# Patient Record
Sex: Female | Born: 1935 | Race: White | Hispanic: No | Marital: Single | Smoking: Former smoker
Health system: Southern US, Community
[De-identification: ages and names within clinical notes are randomized; demographics above are authoritative.]

## PROBLEM LIST (undated history)

## (undated) DIAGNOSIS — M1990 Unspecified osteoarthritis, unspecified site: Secondary | ICD-10-CM

## (undated) DIAGNOSIS — T4145XA Adverse effect of unspecified anesthetic, initial encounter: Secondary | ICD-10-CM

## (undated) DIAGNOSIS — E669 Obesity, unspecified: Secondary | ICD-10-CM

## (undated) DIAGNOSIS — J302 Other seasonal allergic rhinitis: Secondary | ICD-10-CM

## (undated) DIAGNOSIS — F419 Anxiety disorder, unspecified: Secondary | ICD-10-CM

## (undated) DIAGNOSIS — N183 Chronic kidney disease, stage 3 unspecified: Secondary | ICD-10-CM

## (undated) DIAGNOSIS — E119 Type 2 diabetes mellitus without complications: Secondary | ICD-10-CM

## (undated) DIAGNOSIS — G4730 Sleep apnea, unspecified: Secondary | ICD-10-CM

## (undated) DIAGNOSIS — I10 Essential (primary) hypertension: Secondary | ICD-10-CM

## (undated) DIAGNOSIS — E662 Morbid (severe) obesity with alveolar hypoventilation: Secondary | ICD-10-CM

## (undated) DIAGNOSIS — I509 Heart failure, unspecified: Secondary | ICD-10-CM

## (undated) DIAGNOSIS — T8859XA Other complications of anesthesia, initial encounter: Secondary | ICD-10-CM

## (undated) DIAGNOSIS — R0602 Shortness of breath: Secondary | ICD-10-CM

## (undated) DIAGNOSIS — K219 Gastro-esophageal reflux disease without esophagitis: Secondary | ICD-10-CM

## (undated) HISTORY — PX: PILONIDAL CYST EXCISION: SHX744

## (undated) HISTORY — PX: TONSILLECTOMY: SUR1361

## (undated) HISTORY — PX: CHOLECYSTECTOMY: SHX55

## (undated) HISTORY — PX: BREAST SURGERY: SHX581

## (undated) HISTORY — PX: OTHER SURGICAL HISTORY: SHX169

## (undated) HISTORY — PX: EYE SURGERY: SHX253

## (undated) HISTORY — PX: HERNIA REPAIR: SHX51

---

## 1998-05-08 ENCOUNTER — Encounter: Payer: Self-pay | Admitting: Family Medicine

## 1998-05-08 ENCOUNTER — Ambulatory Visit (HOSPITAL_COMMUNITY): Admission: RE | Admit: 1998-05-08 | Discharge: 1998-05-08 | Payer: Self-pay | Admitting: Family Medicine

## 1998-07-29 ENCOUNTER — Other Ambulatory Visit: Admission: RE | Admit: 1998-07-29 | Discharge: 1998-07-29 | Payer: Self-pay | Admitting: Family Medicine

## 1999-05-25 ENCOUNTER — Encounter: Payer: Self-pay | Admitting: Family Medicine

## 1999-05-25 ENCOUNTER — Ambulatory Visit (HOSPITAL_COMMUNITY): Admission: RE | Admit: 1999-05-25 | Discharge: 1999-05-25 | Payer: Self-pay | Admitting: Family Medicine

## 1999-11-15 ENCOUNTER — Encounter: Admission: RE | Admit: 1999-11-15 | Discharge: 2000-02-13 | Payer: Self-pay | Admitting: Family Medicine

## 2000-05-01 ENCOUNTER — Encounter (INDEPENDENT_AMBULATORY_CARE_PROVIDER_SITE_OTHER): Payer: Self-pay | Admitting: *Deleted

## 2000-05-01 ENCOUNTER — Encounter (INDEPENDENT_AMBULATORY_CARE_PROVIDER_SITE_OTHER): Payer: Self-pay | Admitting: Specialist

## 2000-05-02 ENCOUNTER — Inpatient Hospital Stay (HOSPITAL_COMMUNITY): Admission: EM | Admit: 2000-05-02 | Discharge: 2000-05-05 | Payer: Self-pay

## 2000-05-02 ENCOUNTER — Encounter: Payer: Self-pay | Admitting: Geriatric Medicine

## 2000-05-05 ENCOUNTER — Inpatient Hospital Stay: Admission: RE | Admit: 2000-05-05 | Discharge: 2000-05-11 | Payer: Self-pay | Admitting: *Deleted

## 2001-05-25 ENCOUNTER — Ambulatory Visit (HOSPITAL_COMMUNITY): Admission: RE | Admit: 2001-05-25 | Discharge: 2001-05-25 | Payer: Self-pay | Admitting: Family Medicine

## 2001-05-25 ENCOUNTER — Encounter: Payer: Self-pay | Admitting: Family Medicine

## 2002-05-28 ENCOUNTER — Encounter: Payer: Self-pay | Admitting: Family Medicine

## 2002-05-28 ENCOUNTER — Ambulatory Visit (HOSPITAL_COMMUNITY): Admission: RE | Admit: 2002-05-28 | Discharge: 2002-05-28 | Payer: Self-pay | Admitting: Family Medicine

## 2003-05-02 ENCOUNTER — Inpatient Hospital Stay (HOSPITAL_COMMUNITY): Admission: EM | Admit: 2003-05-02 | Discharge: 2003-05-21 | Payer: Self-pay | Admitting: Emergency Medicine

## 2004-11-25 ENCOUNTER — Other Ambulatory Visit: Admission: RE | Admit: 2004-11-25 | Discharge: 2004-11-25 | Payer: Self-pay | Admitting: Family Medicine

## 2005-04-01 ENCOUNTER — Ambulatory Visit (HOSPITAL_COMMUNITY): Admission: RE | Admit: 2005-04-01 | Discharge: 2005-04-01 | Payer: Self-pay | Admitting: Family Medicine

## 2006-05-12 ENCOUNTER — Ambulatory Visit (HOSPITAL_COMMUNITY): Admission: RE | Admit: 2006-05-12 | Discharge: 2006-05-12 | Payer: Self-pay | Admitting: Family Medicine

## 2007-05-14 ENCOUNTER — Ambulatory Visit (HOSPITAL_COMMUNITY): Admission: RE | Admit: 2007-05-14 | Discharge: 2007-05-14 | Payer: Self-pay | Admitting: Family Medicine

## 2007-10-26 ENCOUNTER — Encounter: Admission: RE | Admit: 2007-10-26 | Discharge: 2007-10-26 | Payer: Self-pay | Admitting: Family Medicine

## 2008-10-31 ENCOUNTER — Ambulatory Visit (HOSPITAL_COMMUNITY): Admission: RE | Admit: 2008-10-31 | Discharge: 2008-10-31 | Payer: Self-pay | Admitting: Family Medicine

## 2010-04-04 ENCOUNTER — Inpatient Hospital Stay (HOSPITAL_COMMUNITY): Admission: EM | Admit: 2010-04-04 | Discharge: 2010-04-07 | Payer: Self-pay | Admitting: Emergency Medicine

## 2010-04-05 ENCOUNTER — Encounter (INDEPENDENT_AMBULATORY_CARE_PROVIDER_SITE_OTHER): Payer: Self-pay | Admitting: Internal Medicine

## 2010-07-27 ENCOUNTER — Encounter (INDEPENDENT_AMBULATORY_CARE_PROVIDER_SITE_OTHER): Payer: Self-pay | Admitting: *Deleted

## 2010-07-27 ENCOUNTER — Other Ambulatory Visit: Payer: Self-pay | Admitting: Internal Medicine

## 2010-07-27 DIAGNOSIS — R112 Nausea with vomiting, unspecified: Secondary | ICD-10-CM

## 2010-07-27 DIAGNOSIS — Z1231 Encounter for screening mammogram for malignant neoplasm of breast: Secondary | ICD-10-CM

## 2010-08-04 ENCOUNTER — Ambulatory Visit: Payer: Self-pay

## 2010-08-05 ENCOUNTER — Other Ambulatory Visit (HOSPITAL_COMMUNITY): Payer: Self-pay

## 2010-08-05 NOTE — Op Note (Signed)
Summary: Laparoscopic Cholecystectomy                         [facility]  Patient:    Makayla Rodriguez, Makayla Rodriguez                       MRN: [id number] Proc. Date: 05/01/00 Adm. Date:  [id number] Attending:  Gennie Alma CC:         Duncan Dull, M.D.   Operative Report  CENTRAL Crystal #:  [id number]  PREOPERATIVE DIAGNOSES: 1. Chronic cholecystitis and cholelithiasis. 2. Chronically incarcerated large umbilical hernia.  OPERATION PERFORMED:  Laparoscopic cholecystectomy and repair of a large umbilical hernia with Gore-Tex mesh.  SURGEON:  Dr. Orpah Greek.  ASSISTANT:  Dr. Lorelee New.  DESCRIPTION OF PROCEDURE:  Under adequate general endotracheal anesthesia, the patients abdomen was prepared and draped in the usual fashion. There was a large umbilical hernia present in this woman who weighed in excess of 250 pounds and had a very large and deep abdomen. A smile incision was made in the inferior portion of the umbilicus just below the umbilical hernia and carried down through the deep subcutaneous tissue. The wounds were coagulated. The hernia mass was retracted superiorly and the deep fascia inferior to the hernia was exposed and opened longitudinally. The peritoneum was entered. A 10 mm Hasson cannula was introduced into the abdomen. Holding sutures of #0 Vicryl were applied to the fascia. The abdomen was then inflated with carbon dioxide to a filling pressure of 15 mmHg with an automatic insufflator. A video laparoscope was introduced. With this being used for direct vision, an upper midline 10 mm port was introduced atraumatically.  The 5 mm subcostal ports could not be initially inserted because of multiple adhesions in the right upper quadrant of the abdomen due to previous surgery. These adhesions were taken down using hot scissors with electrocoagulation introduced through the upper midline port. Once the right upper quadrant was free of adhesions, the two 5 mm  ports were inserted. There are actually 3 incisions made because the first attempt was made while the adhesions were still present and had to be abandoned. With all ports now operable, the patient was placed in reverse Trendelenburg position and turned to the left. The gallbladder was grasped with grasping forceps and placed on traction superiorly. We had a good deal of difficulty seeing the hilum of the gallbladder because of the patients size and had to use a 30 degree scope. We were able to identify the cystic duct and had it thoroughly well skeletonized It was quadruply clipped with endoclips and transected between the distal 2 clips. A large branch of the cystic artery was never identified but multiple small arteries were clipped and divided as the gallbladder was dissected out of the gallbladder bed of the liver. This was done using hook and spatula coagulation. Hemostasis was intact. There was a slight entrance made into the gallbladder and for this reason, it was placed in an endocatch bag and withdrawn from the umbilical port incision.  The subhepatic and subphrenic spaces were irrigated with sterile saline solution until clear. The subcostal ports were withdrawn under direct vision and no bleeding was noted. The upper midline port was left in place while the repair of the umbilical hernia was carried out.  The automatic insufflator was discontinued and the abdomen was decompressed. There was a large hernia sac present that was the size of a large  orange. It was dissected away from the subcutaneous tissue and from the skin of the umbilicus. The fascia was exposed circumferentially around the entire hernia mass. The sac was then opened and it was found that there was chronic incarceration of the omentum and as a matter of fact there was more omentum out of the abdomen than could be replaced into the abdomen through the defect. We therefore had to excise a good deal of the omentum  over Kelly clamps which were ligated with 2-0 Vicryl. We were then able to reduce the remaining amount of omentum that was present. The hernia sac was then excised with Bovie electrocoagulation thereby leaving a defect in the fascia that was 8 cm in diameter. This was repaired by inserting dual mesh into the defect into the peritoneal cavity and the preperitoneal position with the smooth side facing the internal organs. The Gore-Tex measured 9 x 15. It was oriented longitudinally. It was fixed to the peritoneum with interrupted vertical mattress sutures of Novofil. This gave a secure closure of the defect. The fascia was closed over the Gore-Tex mesh with interrupted sutures of #0 Novofil and the subcutaneous fat was closed with interrupted sutures of 3-0 Vicryl.  We then used the automatic insufflator to reinflate the abdomen and looked at the internal portion of the Gore-Tex and its deployment. We found that there was some rolling up of the edges and some of the rough side of the gallbladder exposed.  We reinserted a 5 mm port through one of the port incisions and used it to smooth out the Gore-Tex in a preperitoneal position. We then fixed it in that position with the spiral tacker.  This gave very good deployment of the Gore-Tex and the abdomen was deflated and the 5 mm and 10 mm ports were withdrawn.  All skin incisions were closed with the generic skin stapler 35-W. It should be noted that the umbilical incision had to be extended laterally in each direction during the course of the repair of the umbilical hernia in order to allow for adequate exposure. The patient tolerated the procedure well and left the operating room in satisfactory condition. DD:  05/01/00 TD:  05/01/00 Job: [id number]        SP Surgical Pathology - STATUS: Final             By: Threasa Beards  ,        Perform Date: 3 Dec01 07:26  Ordered By: Orpah Greek MD , Rennie Plowman          Ordered Date:   Facility: [facility]                              Department: CPATH  Service Report Text  [facility]   [address] [place], [place] [address]   [phone]    REPORT OF SURGICAL PATHOLOGY    Case #: [id number]   Patient Name: DEVANEY, SEGERS   PID: [id number]   Pathologist: Havery Moros, MD   DOB/Age 65/25/1936 (Age: 26) Gender: F   Date Taken: 05/01/2000   Date Received: 05/01/2000    FINAL DIAGNOSIS    ***MICROSCOPIC EXAMINATION AND DIAGNOSIS***    I. GALLBLADDER: CHRONIC CHOLECYSTITIS AND CHOLELITHIASIS.   BENIGN PERICYSTIC LYMPH NODE.    II. BENIGN HERNIA SAC AND BENIGN OMENTAL ADIPOSE TISSUE.    ab   Date Reported: 05/02/2000 Havery Moros,  MD   *** Electronically Signed Out By BNS ***    Clinical information   Cholelithiasis. (tmc)    specimen(s) obtained   1: Gallbladder   2: Portion of Omentum, hernia sac and hernia sac contents    Gross Description   I. Received in formalin is a 10.2 x 3.5 x 2.3 cm intact   gallbladder which has a hyperemic external surface. The   gallbladder contains bile and a few pale green calculi measuring   0.7 to 1.2 cm in greatest dimension. The wall measures 0.1 cm in   thickness. The mucosa is glistening tan red. A 0.3 cm segment   of cystic duct is present which is unremarkable. Adjacent to the   duct there is a 0.8 cm rubbery pink ovoid nodule tentatively   identified as a lymph node. Sections are submitted in one   cassette.    II. Received in formalin is a 15 x 9 x 1 cm portion of fat   partially surfaced by a smooth pink translucent fibromembranous   tissue. There is a separate 22 x 9 x 2 cm portion of fat   consistent with a portion of omentum. There are no mass lesions.   Sections are submitted in one cassette. (GRP:smr 12/3)    smr/

## 2010-08-05 NOTE — Letter (Signed)
Summary: New Patient letter  [facility]  [address] [place], [place] [address]   Phone: [phone]  Fax: [phone]       07/27/2010 MRN: [id number]  Makayla Rodriguez [address] [place], [place]  [address]  Dear Ms. Lauver,  Welcome to the Gastroenterology Division at [facility].    You are scheduled to see Dr.  Claudette Head on August 06, 2010 at 11:30am on the 3rd floor at [facility], [address]  We ask that you try to arrive at our office 15 minutes prior to your appointment time to allow for check-in.  We would like you to complete the enclosed self-administered evaluation form prior to your visit and bring it with you on the day of your appointment.  We will review it with you.  Also, please bring a complete list of all your medications or, if you prefer, bring the medication bottles and we will list them.  Please bring your insurance card so that we may make a copy of it.  If your insurance requires a referral to see a specialist, please bring your referral form from your primary care physician.  Co-payments are due at the time of your visit and may be paid by cash, check or credit card.     Your office visit will consist of a consult with your physician (includes a physical exam), any laboratory testing he/she may order, scheduling of any necessary diagnostic testing (e.g. x-ray, ultrasound, CT-scan), and scheduling of a procedure (e.g. Endoscopy, Colonoscopy) if required.  Please allow enough time on your schedule to allow for any/all of these possibilities.    If you cannot keep your appointment, please call [phone] to cancel or reschedule prior to your appointment date.  This allows [place] the opportunity to schedule an appointment for another patient in need of care.  If you do not cancel or reschedule by 5 p.m. the business day prior to your appointment date, you will be charged a $50.00 late cancellation/no-show fee.    Thank you for  choosing [facility] for your medical needs.  We appreciate the opportunity to care for you.  Please visit [place] at our website  to learn more about our practice.                     Sincerely,                                                             The Gastroenterology Division

## 2010-08-06 ENCOUNTER — Ambulatory Visit: Payer: Self-pay | Admitting: Gastroenterology

## 2010-08-10 LAB — URINE MICROSCOPIC-ADD ON

## 2010-08-10 LAB — GLUCOSE, CAPILLARY
Glucose-Capillary: 144 mg/dL — ABNORMAL HIGH (ref 70–99)
Glucose-Capillary: 165 mg/dL — ABNORMAL HIGH (ref 70–99)
Glucose-Capillary: 169 mg/dL — ABNORMAL HIGH (ref 70–99)
Glucose-Capillary: 176 mg/dL — ABNORMAL HIGH (ref 70–99)
Glucose-Capillary: 185 mg/dL — ABNORMAL HIGH (ref 70–99)
Glucose-Capillary: 255 mg/dL — ABNORMAL HIGH (ref 70–99)

## 2010-08-10 LAB — URINALYSIS, ROUTINE W REFLEX MICROSCOPIC
Bilirubin Urine: NEGATIVE
Nitrite: POSITIVE — AB
pH: 5.5 (ref 5.0–8.0)

## 2010-08-10 LAB — DIFFERENTIAL
Basophils Absolute: 0 10*3/uL (ref 0.0–0.1)
Basophils Absolute: 0.1 10*3/uL (ref 0.0–0.1)
Basophils Relative: 1 % (ref 0–1)
Basophils Relative: 1 % (ref 0–1)
Eosinophils Absolute: 0.7 10*3/uL (ref 0.0–0.7)
Eosinophils Absolute: 0.7 10*3/uL (ref 0.0–0.7)
Eosinophils Relative: 6 % — ABNORMAL HIGH (ref 0–5)
Eosinophils Relative: 7 % — ABNORMAL HIGH (ref 0–5)
Lymphocytes Relative: 23 % (ref 12–46)
Lymphocytes Relative: 24 % (ref 12–46)
Lymphs Abs: 2.4 10*3/uL (ref 0.7–4.0)
Lymphs Abs: 2.5 10*3/uL (ref 0.7–4.0)
Monocytes Absolute: 1 10*3/uL (ref 0.1–1.0)
Monocytes Absolute: 1 10*3/uL (ref 0.1–1.0)
Monocytes Relative: 10 % (ref 3–12)
Monocytes Relative: 10 % (ref 3–12)
Neutro Abs: 5.9 10*3/uL (ref 1.7–7.7)
Neutro Abs: 6.5 10*3/uL (ref 1.7–7.7)
Neutrophils Relative %: 59 % (ref 43–77)
Neutrophils Relative %: 60 % (ref 43–77)

## 2010-08-10 LAB — LIPID PANEL
HDL: 50 mg/dL (ref >39)
LDL Cholesterol: 32 mg/dL (ref 0–99)
Triglycerides: 85 mg/dL (ref <150)
VLDL: 17 mg/dL (ref 0–40)

## 2010-08-10 LAB — VITAMIN B12: Vitamin B-12: 349 pg/mL (ref 211–911)

## 2010-08-10 LAB — BASIC METABOLIC PANEL
BUN: 18 mg/dL (ref 6–23)
BUN: 23 mg/dL (ref 6–23)
BUN: 30 mg/dL — ABNORMAL HIGH (ref 6–23)
CO2: 30 mEq/L (ref 19–32)
CO2: 32 mEq/L (ref 19–32)
Calcium: 10.6 mg/dL — ABNORMAL HIGH (ref 8.4–10.5)
Calcium: 9 mg/dL (ref 8.4–10.5)
Chloride: 100 mEq/L (ref 96–112)
Chloride: 98 mEq/L (ref 96–112)
Creatinine, Ser: 0.72 mg/dL (ref 0.4–1.2)
Creatinine, Ser: 0.77 mg/dL (ref 0.4–1.2)
GFR calc Af Amer: >60 mL/min (ref >60)
GFR calc non Af Amer: >60 mL/min (ref >60)
GFR calc non Af Amer: >60 mL/min (ref >60)
Glucose, Bld: 176 mg/dL — ABNORMAL HIGH (ref 70–99)
Glucose, Bld: 202 mg/dL — ABNORMAL HIGH (ref 70–99)
Potassium: 4.4 mEq/L (ref 3.5–5.1)
Potassium: 4.4 mEq/L (ref 3.5–5.1)
Sodium: 139 mEq/L (ref 135–145)

## 2010-08-10 LAB — COMPREHENSIVE METABOLIC PANEL
ALT: 20 U/L (ref 0–35)
AST: 20 U/L (ref 0–37)
Albumin: 3.3 g/dL — ABNORMAL LOW (ref 3.5–5.2)
Alkaline Phosphatase: 46 U/L (ref 39–117)
BUN: 28 mg/dL — ABNORMAL HIGH (ref 6–23)
CO2: 27 mEq/L (ref 19–32)
Calcium: 10 mg/dL (ref 8.4–10.5)
Chloride: 100 mEq/L (ref 96–112)
Creatinine, Ser: 0.66 mg/dL (ref 0.4–1.2)
GFR calc Af Amer: >60 mL/min (ref >60)
GFR calc non Af Amer: >60 mL/min (ref >60)
Glucose, Bld: 146 mg/dL — ABNORMAL HIGH (ref 70–99)
Potassium: 4.1 mEq/L (ref 3.5–5.1)
Sodium: 138 mEq/L (ref 135–145)
Total Bilirubin: 0.2 mg/dL — ABNORMAL LOW (ref 0.3–1.2)
Total Protein: 7.1 g/dL (ref 6.0–8.3)

## 2010-08-10 LAB — FOLATE RBC: RBC Folate: 1010 ng/mL — ABNORMAL HIGH (ref 180–600)

## 2010-08-10 LAB — CBC
HCT: 36.8 % (ref 36.0–46.0)
HCT: 40.3 % (ref 36.0–46.0)
Hemoglobin: 12.4 g/dL (ref 12.0–15.0)
Hemoglobin: 13.6 g/dL (ref 12.0–15.0)
MCH: 30.7 pg (ref 26.0–34.0)
MCH: 31.1 pg (ref 26.0–34.0)
MCHC: 33.7 g/dL (ref 30.0–36.0)
MCHC: 33.7 g/dL (ref 30.0–36.0)
MCV: 91.1 fL (ref 78.0–100.0)
MCV: 92.2 fL (ref 78.0–100.0)
Platelets: 335 10*3/uL (ref 150–400)
Platelets: 381 10*3/uL (ref 150–400)
RBC: 4.03 MIL/uL (ref 3.87–5.11)
RBC: 4.37 MIL/uL (ref 3.87–5.11)
RDW: 13.1 % (ref 11.5–15.5)
RDW: 13.4 % (ref 11.5–15.5)
WBC: 10 10*3/uL (ref 4.0–10.5)
WBC: 10.8 10*3/uL — ABNORMAL HIGH (ref 4.0–10.5)

## 2010-08-10 LAB — PHOSPHORUS: Phosphorus: 4.1 mg/dL (ref 2.3–4.6)

## 2010-08-10 LAB — URINE CULTURE

## 2010-08-10 LAB — HEMOGLOBIN A1C
Hgb A1c MFr Bld: 7 % — ABNORMAL HIGH (ref <5.7)
Mean Plasma Glucose: 154 mg/dL — ABNORMAL HIGH (ref <117)

## 2010-08-10 LAB — TSH: TSH: 2.946 u[IU]/mL (ref 0.350–4.500)

## 2010-08-10 LAB — MAGNESIUM: Magnesium: 1.9 mg/dL (ref 1.5–2.5)

## 2010-10-15 NOTE — Discharge Summary (Signed)
Makayla Rodriguez, Makayla Rodriguez                          ACCOUNT NO.:  [id number]   MEDICAL RECORD NO.:  [id number]                   PATIENT TYPE:  EMS   LOCATION:  ED                                   FACILITY:  [facility]   PHYSICIAN:  Deirdre Peer. Polite, M.D.              DATE OF BIRTH:  10/21/1934   DATE OF ADMISSION:  05/02/2003  DATE OF DISCHARGE:                                 DISCHARGE SUMMARY   CHIEF COMPLAINT:  Severe right leg pain with inability to get up.   HISTORY OF PRESENT ILLNESS:  A morbidly obese female with history of  hypertension, history of chronic right leg pain x2 months since a fall in  October, who presents to the ED via EMS for ambulatory dysfunction secondary  to chronic leg pain.  The patient dates her problem back to October 2004,  when she stumbled, fell, and injured her knee and her ankle, and since she  has been seen by an orthopedist where she states she has had x-rays that  were negative for any fracture and was given analgesia with NSAIDs without  any improvement.  Ultimately she thinks that she has had a steroid injection  in her knee and within the last week has been prescribed physical therapy.  Despite the above treatments, her ability to ambulate has been severely  hampered.  Over the last two months she states that she had been able to  ambulate with a walker and crutches but most recently feels that her  sciatica has been aggravated by recent therapies and now she is unable to  ambulate to the point that she has been urinating on herself.  Since the  patient's previous admission, she has denied any recurrent falls or injuries  to the leg.  She states that her leg has just gotten progressively weaker  and the pain has gotten excruciating today.  Evaluation in the ED revealed  the woman to be morbidly obese, in severe pain.  No x-rays have been done;  however, the patient has an ultrasound of her extremity which was negative  for a DVT.  The patient's  orthopedist has not been called.   PAST MEDICAL HISTORY:  1. As stated above, chronic right lower extremity pain post stumbling in     October 2004, with resultant right knee and right ankle pain.  2. History of sciatica, aggravated by #1.  3. Ambulatory dysfunction secondary to #1 and #2.  4. Hypertension.  5. Morbid obesity.  6. Reactive depression.  7. History of macular degeneration.  8. Recent reports of polyuria.   MEDICATIONS:  1. Lasix 20 mg daily.  2. K-Dur 20 mEq daily.  3. Norvasc 5 mg daily.  4. Lisinopril 20 mg daily.  5. ___________ 1-2 mg q.4-6h.   SOCIAL HISTORY:  Positive for occasional tobacco.  The patient denies  alcohol or drugs.   PAST SURGICAL HISTORY:  1. Umbilical hernia  repair in December 2001.  2. Cholecystectomy in December 2001.   The patient denies appendectomy or hysterectomy.   ALLERGIES:  The patient describes allergies to PENICILLIN, CODEINE, and  SULFA.   REVIEW OF SYSTEMS:  As stated above.   FAMILY HISTORY:  Noncontributory.   PHYSICAL EXAMINATION:  GENERAL:  The patient is alert and oriented x3, in  moderate distress secondary to extremity pain.  VITAL SIGNS:  Temperature 97.0, BP 160/67, pulse 96, respiratory rate of 18,  saturating 100%.  HEENT:  Without abnormality.  CHEST:  Clear, no rales.  CARDIOVASCULAR:  Regular, no S3.  ABDOMEN:  Morbidly obese.  EXTREMITIES:  Right knee severely painful without associated swelling or  erythema or warmth.  2+ pulse, 1+ edema.  Negative straight leg test on the  right.  Leg is not externally rotated.  There is no pain with palpation over  the trochanter.  NEUROLOGIC:  Nonfocal except for weak leg elevation secondary to severe  pain.  Toes are downgoing bilaterally.  Gait not tested secondary to  ambulatory function.   DATA:  The patient has had an ultrasound of the right lower extremity which  is negative for DVT.  There are no other studies at this time.   ASSESSMENT AND PLAN:  1.  Intractable pain in the right extremity secondary to chronic knee injury     x2 months with resultant exacerbation of existing sciatica.  X-ray of the     hip and knee.  Will provide analgesia.  We will provide a physical     therapy consult as well as have the patient's orthopedist see the     patient.  We will also place the patient on DVT prophylaxis and make     further recommendations after review of the above.  2. Hypertension.  Continue outpatient medicines.  3. Reactive depression.  Recommend start Lexapro and p.r.n. anxiolytic.  4. Complaints of urinary frequency at night.  Of note, the patient does take     Lasix and this may just be a problem related to that; however, we will     check a UA, C&S, as well as check a fasting blood sugar to exclude     diabetes.  5. Morbid obesity.  The patient will be encouraged to ambulate as soon as     possible.                                               Deirdre Peer. Polite, M.D.    RDP/MEDQ  D:  05/02/2003  T:  05/02/2003  Job:  [id number]   cc:   Duncan Dull, M.D.  [address]  [place]  [place] [address]  Fax: [phone]   Lunette Stands, M.D.  [address]  [place] [address]  Fax: [phone]

## 2010-10-15 NOTE — Discharge Summary (Signed)
Makayla Rodriguez, Makayla Rodriguez                          ACCOUNT NO.:  [id number]   MEDICAL RECORD NO.:  [id number]                   PATIENT TYPE:  INP   LOCATION:  0482                                 FACILITY:  [facility]   PHYSICIAN:  Leonia Reeves, MD                 DATE OF BIRTH:  10/21/1934   DATE OF ADMISSION:  05/02/2003  DATE OF DISCHARGE:  05/21/2003                                 DISCHARGE SUMMARY   ADDENDUM TO DISCHARGE SUMMARY:   REASON FOR CHANGE IN PLAN OF DISCHARGE:  The patient changed her mind and  decided to go home as opposed to our earlier discharge plans in which the  patient agreed to go to the skilled nursing facility/nursing home.  She  preferred to go home and have her own arrangements for 24-hour care.  However, the patient will be discharged with PT/OT at home.  Discharge plans  regarding discharge medications and follow-up visit with her primary care  physician remain the same.                                               Leonia Reeves, MD    VO/MEDQ  D:  05/21/2003  T:  05/21/2003  Job:  [id number]

## 2010-10-15 NOTE — Consult Note (Signed)
Makayla Rodriguez, Makayla Rodriguez                          ACCOUNT NO.:  [id number]   MEDICAL RECORD NO.:  [id number]                   PATIENT TYPE:  INP   LOCATION:  0482                                 [facility]ACILITY:  [facility]   PHYSICIAN:  Ollen Gross, M.D.                 DATE OF BIRTH:  10/21/1934   DATE OF CONSULTATION:  12020-08-2102  DATE OF DISCHARGE:                                   CONSULTATION   REASON FOR CONSULTATION:  Right lower extremity pain.   HISTORY OF PRESENT ILLNESS:  Makayla Rodriguez is a 77 year old female with  hypertension, obesity, and depression who has an approximate six to eight  week history of right lower extremity pain.  It began when she attempted to  climb onto a SUV and she began to fell backwards and her nephew caught her.  She hyperextended her knee at that time.  She developed significant right  knee and ankle pain.  She was seen by Dr. Lunette Stands as an outpatient, and  stated that she had one Cortizone injection which has helped partially and  only temporarily.  She also states she was doing some home therapy exercises  and it began to worsen with the exercise.  Her pain increased significantly  on May 02, 2003, to the point where she could not get up out of bed or  into a chair, and came to the hospital.  She required admission because of  the extensive pain she was having in her right lower extremity.  Orthopaedic  consultation by Dr. Georgena Spurling was obtained and he recommended MRI lumbar  and need to rule out any pathology in either area.  She has requested that I  see her based on several recommendations from friends.  She does state that  the pain is starting to ease up now.  She is currently able to sit in a  chair, which she was unable to do until even several days ago.  She has  never had any paresthesias associated with this discomfort.  The pain was  isolated to her knee and then cleared.  She said occasionally her sciatica  would act up, but that is  more chronic in nature, and she feels it is  different from what she is experiencing now.   PHYSICAL EXAMINATION:  GENERAL:  She is a very pleasant, well-developed,  obese female, alert and oriented, in no acute distress.  ORTHOPAEDIC EXAMINATION:  Shows that she is nontender in the pelvis or in  the hip.  She has no pain with range of motion of her right hip.  Right knee  shows no effusion.  Range of motion is 0 to 60.  Any attempt at flexion  greater than 60 causes significant pain.  She is exquisitely tender over her  lateral joint line, also has some medial joint line tenderness.  There is no  instability noted today.  There is  crepitus with range of motion.  Right  ankle exam shows no ecchymoses or swelling.  There is minimal lateral  tenderness over the anterior lateral ligaments, but no instability of the  ankle and no medial sided tenderness.  Sensation is intact.  Her left lower  extremity exam is within normal limits.   LABORATORY DATA:  X-ray reports:  Plain films of the right knee show mild to  moderate osteoarthritis.  MRI report of the lumbar spine shows multi-level  spinal stenosis and no evidence of any acute disk herniations.  MRI of her  right knee shows an ACL tear, as well as a lateral meniscal tear with  displacement into the intercondylar notch, as well as some osteoarthritic  changes in the lateral patellofemoral compartments.   ASSESSMENT:  Right lower extremity pain.  This is most likely secondary to  her knee injury, given the history, exam, and very rapid findings.  Spinal  stenosis in and of itself would not cause this type of pain that she is  experiencing at rest unless there was an associated disk herniation with  nerve root impingement.  The MRI did not show that.  Her knee also is very  tender laterally, and she probably has a Bucket handle lateral meniscal tear  with locking episodes.  Currently, she is beginning to feel a little better,  thus I  recommend beginning therapy and seeing how she does.  I actually gave  her the option of pursuing arthroscopic treatment versus therapy first, and  she would like to do therapy first and see.  We did discuss that if therapy  fails the next thing to consider would be arthroscopy.  This would not cure  her arthritis, but would address the lateral meniscus and hopefully allow  her to more effectively bear weight on her right lower extremity.  I do not  feel there is anything going on with the ankle now other than a sprain.  If  indeed she regresses, physical therapy and she would be a suitable candidate  for arthroscopic treatment.  We discussed everything in detail, and the plan  will be per the patient's request to try therapy first and then see how she  does and make a decision on surgery based on her response to therapy.  I  will review all the radiographs personally and will continue to follow her.                                               Ollen Gross, M.D.    FA/MEDQ  D:  1June 25, 202004  T:  1June 25, 202004  Job:  [id number]   cc:   Melissa L. Ladona Ridgel, MD

## 2010-10-15 NOTE — H&P (Signed)
[facility]  Patient:    Makayla Rodriguez, Makayla Rodriguez                       MRN: [id number] Adm. Date:  [id number] Disc. Date: [id number] Attending:  Selina Cooley                         History and Physical  [facility] [place] NUMBER:  [phone]  CHIEF COMPLAINT:  Gallstones and large umbilical hernia.  HISTORY OF PRESENT ILLNESS:  This 77 year old obese female had been known to have an umbilical hernia for several years.  It had recently become very symptomatic and chronically incarcerated.  The patient had had several episodes of abdominal distention and firmness of her abdomen associated with indigestion.  She was also known to have gallstones by positive ultrasound scan.  She was therefore admitted for repair of a large, chronically incarcerated umbilical hernia and laparoscopic cholecystectomy.  PAST MEDICAL HISTORY: 1. Hypertension. 2. No history of diabetes or heart disease. 3. Morbid obesity.  She weighs in excess of 300 lb.  CURRENT MEDICATIONS: 1. Vitamin E 800 units q.d. 2. Beconase, two squirts q.d. for sinus disease. 3. Baby aspirin q.d., which she had stopped one week prior to admission. 4. Amitriptyline 2/10, one q.d. for anxiety. 5. Norvasc 5 mg q.d. 6. Furosemide 20 mg q.d. 7. Zestril 10 mg q.d.  ALLERGIES:  PENICILLIN and CODEINE.  PAST SURGICAL HISTORY: 1. Tonsillectomy. 2. Pilonidal cyst. 3. Breast biopsy x 2 in the 1970s.  FAMILY HISTORY:  Noncontributory.  SOCIAL HISTORY:  The patient is single and lives alone.  She does not smoke or drink alcoholic beverages.  REVIEW OF SYSTEMS:  No recent fevers, chills, night sweats or weight loss. HEENT: No headaches, dizzy spells, fainting spells or convulsions.  ENT was negative.  CHEST: No chest pain, no rhonchi, no dyspnea or wheezing.  GI: No nausea, vomiting, diarrhea or constipation.  GU: No dysuria, frequency, hematuria or nocturia.  PHYSICAL EXAMINATION:  GENERAL:  Morbidly obese  77 year old female who was in excess of 250 lb.  SKIN:  Warm and dry.  Normal color, texture and turgor.  HEENT:  Normocephalic.  Pupils equal and reactive to light and accommodation. No scleral icterus or conjunctival pallor.  Throat clear.  Good dentition. Tongue normal.  NECK:  Trachea midline.  Thyroid within normal limits.  No cervical adenopathy.  LUNGS:  Clear to auscultation and percussion.  HEART:  Regular rhythm.  No murmurs, rubs, or gallops.  BREASTS:  Normal to palpation bilaterally without dominant masses, nipple or skin changes.  ABDOMEN:  Very obese.  There were no palpable masses, tenderness or visceromegaly other than the very large umbilical hernia that did not reduce completely on manipulation.  The hernia was approximately 5 cm in diameter.  RECTAL:  Not done.  EXTREMITIES:  Without any significant problems.  IMPRESSION: 1. Chronically incarcerated umbilical hernia. 2. Cholelithiasis. 3. Morbid obesity. 4. Hypertension. DD:  06/06/00 TD:  06/06/00 Job: [id number]

## 2010-10-15 NOTE — Discharge Summary (Signed)
Makayla Rodriguez, Makayla Rodriguez                          ACCOUNT NO.:  [id number]   MEDICAL RECORD NO.:  [id number]                   PATIENT TYPE:  INP   LOCATION:  0482                                 FACILITY:  [facility]   PHYSICIAN:  Leonia Reeves, MD                 DATE OF BIRTH:  10/21/1934   DATE OF ADMISSION:  05/02/2003  DATE OF DISCHARGE:  05/20/2003                                 DISCHARGE SUMMARY   DISCHARGE DIAGNOSES:  1. Severe right lower extremity pain likely secondary to chronic knee     injury.  2. History of chronic right knee and right ankle injury secondary to     accidental fall.  3. Ambulatory dysfunction secondary to chronic lower extremity pain/injury.  4. Morbid obesity.  5. Reactive depression.   DISCHARGE MEDICATIONS:  1. Lasix 20 mg once daily.  2. K-Dur 10 mEq once daily.  3. Norvasc 5 mg once daily.  4. Lisinopril 20 mg once daily.  5. Protonix 40 mg once daily.  6. Lexapro 10 mg once daily.  7. Elavil 10 mg once at night.  8. Cipro 250 mg twice daily for five days for UTI.  9. Advair 1 mg at night as needed.   SUMMARY:  Makayla Rodriguez is a 77 year old female with history of hypertension,  obesity, and depression, who had a six to eight-week history of right lower  extremity pain.  The problem stated when the patient had an accidental fall  and sustained injury to the right knee and right ankle.  She developed  significant right knee and ankle pain.  She was seen by Lunette Stands, M.D.,  as an outpatient and prescribed cortisone injection, which helped partially  and only temporarily.  She apparently started therapy with exercise, which  incidentally worsened the pain.  Her pain increased significantly on  May 02, 2003, to the point where she could not get out of the bed or  into a chair for which she came to the hospital.  She required admission  because of extensive pain in her right lower extremity.  An orthopedic  consult was done by Mila Homer. Lucey,  M.D., who recommended MRI to rule out  any other etiologies.  X-ray report showed mild to moderate osteoarthritis.  MRI report of the lumbar spine showed multilevel spinal stenosis and no  evidence of any acute disk herniations.  MRI of the right knee showed ACL  tear, as well as lateral meniscal tear with displacement into the  intercondylar notch, as well as some osteoarthritic changes in the lateral  patellofemoral compartments.   PHYSICAL EXAMINATION:  The physical examination on admission showed a  temperature of 97.0 degrees, blood pressure 160/67, pulse 96, respiratory  rate 18, and saturation 100% on room air.  The abdomen was morbidly obese,  otherwise benign.  The right knee was severely tender without associated  swelling, erythema, or warmth.  There  was no evidence of vascular  compromise.  The rest of the physical was unremarkable.   HOSPITAL COURSE:  The patient was admitted to the medical floor and started  on pain medication with Percocet and Celebrex.  Her medications were added  as needed, including Lasix, Norvasc, lisinopril, K-Dur, and Lexapro.  The  patient slowly responded to the above regimen with improvement of pain.   CONSULTS:  An orthopedic consult was done with Ollen Gross, M.D., at the  patient's request.  Dr. Lequita Halt evaluated the patient and also made an  impression of lower extremity pain probably secondary to knee injury.  He  also recommended continued pain medication and also therapy.  The patient  also benefit from PT/OT with some improvement in ambulation and strength.   CONDITION ON DISCHARGE:  Stable.  Pain markedly improved.   DISPOSITION:  The patient is discharged to SNF nursing home for further  rehabilitation and care.  The rational for this discharge was discussed with  the patient and she was agreeable.                                               Leonia Reeves, MD    VO/MEDQ  D:  05/20/2003  T:  05/20/2003  Job:  [id number]

## 2010-10-15 NOTE — Consult Note (Signed)
NAMEBEENA, CATANO                          ACCOUNT NO.:  [id number]   MEDICAL RECORD NO.:  [id number]                   PATIENT TYPE:  INP   LOCATION:  0482                                 FACILITY:  [facility]   PHYSICIAN:  Mila Homer. Sherlean Foot, M.D.              DATE OF BIRTH:  10/21/1934   DATE OF CONSULTATION:  05/03/2003  DATE OF DISCHARGE:                                   CONSULTATION   CONSULTING DIAGNOSIS AND CHIEF COMPLAINT:  Right leg pain.   HISTORY OF PRESENT ILLNESS:  The patient is a 77 year old white female who  was admitted due to intractable pain in the right lower extremity.  She is a  patient of Dr. Lunette Stands, and she had an injury in early October of this  year.  She has had pain ever since.  She is morbid obese, and has a couple  of other stable medical conditions.  She is morbidly obese with a couple of  other stable medical conditions.   PHYSICAL EXAMINATION:  VITAL SIGNS:  She is afebrile.  Vital signs are  stable.  GENERAL:  She is morbidly obese.  RIGHT LOWER EXTREMITY:  She has extreme pain with manipulation of the right  knee.  It is very difficult to get an examination due to guarding.  There is  no appreciable, although the leg is so obese it is difficult to tell.  She  appears to be ligamentously stable, strong, neurovascularly intact.  It is  difficult also to assess when she has a straight leg raise due to the pain.   X-RAYS:  X-rays of the hip and knees were ordered by Dr. Nehemiah Settle, those are  reviewed, and she has moderate arthritis in her knee, minimal arthritis in  her hip.   IMPRESSION:  1. I think her pain is coming from one of two sources.  My primary diagnosis     would probably be radicular pain associated with some pathology in the     back.  2. Some internal derangement of the knee.   TREATMENT:  My recommendations would be for Dr. Nehemiah Settle to obtain x-rays of  the back and a lumbar MRI scan, as well as a lumbar MRI scan of the right  knee.  I think these could be done on an outpatient basis, unless her pain  keeps her in the hospital.  If there are any findings in the spine, he  should consult the neurosurgeon, as I do not take care of back pathology,  and if the MRI scans are done while in the hospital, I will review those and  discuss those with the patient, although it would be an outpatient procedure  to take care of cartilage tear.  Mila Homer. Sherlean Foot, M.D.    SDL/MEDQ  D:  05/03/2003  T:  05/03/2003  Job:  [id number]

## 2010-10-15 NOTE — Discharge Summary (Signed)
[facility]  Patient:    Makayla Rodriguez, Makayla Rodriguez                       MRN: [id number] Adm. Date:  [id number] Disc. Date: [id number] Attending:  Selina Cooley CC:         Duncan Dull, M.D.  Anastasio Auerbach, M.D.   Discharge Summary  CENTRAL [place] NUMBER:  [id number]  FINAL DIAGNOSES: 1. Chronic cholecystitis and cholelithiasis. 2. Chronically incarcerated large umbilical hernia. 3. Morbid obesity. 4. Hypertension. 5. Hpyoventilation syndrome secondary to medications with anoxic    encephalopathy - postoperative complication. 6. Left lower lobe atelectasis - postoperative complication.  HISTORY OF PRESENT ILLNESS:  This 77 year old female who is morbidly obese had been having a very symptomatic, very large, chronically incarcerated umbilical hernia.  She had been having intermittent abdominal pain and intermittent episodes consistent with ileus.  An ultrasound scan of the gallbladder had revealed multiple gallstones.  Physical examination revealed a morbidly obese 77 year old female who is in excess of 250 pounds The abdomen was quite obese. There were no masses but there was a large umbilical hernia that was difficult to completely reduce in recumbency.  The hernia was approximately 5 cm in diameter.  HOSPITAL COURSE:  On the day of admission, the patient underwent a laparoscopic cholecystectomy and repair of the chronically incarcerated umbilical hernia.  It was repaired with mesh and there was a large portion of omentum that was resected because of chronic incarceration and inability to reduce it.  The postoperative course was complicated by an episode of hypoxia and unresponsiveness after receiving intravenous pain medications on the night of surgery.  She was transferred to the intensive care unit and she was found to have mild hypotension and hypoxia.  She responded to airway and oxygen therapy.  She was seen in consultation by Dr. Merlene Laughter and  also seen in consultation by Dr. Lesia Sago.  A CAT scan of the brain was done and no acute intracranial abnormalities were found.  She was found to have atelectasis at the lung bases.  The next morning, she was virtually well.  She was awake and following commands, and able to move all four extremities.  EKG was normal.  The patient continued to be very weak following this episode and was unable to walk independently.  She was seen in consultation by Dr. Anastasio Auerbach for [facility] and transferred to subacute rehabilitation center at [facility] for severely deconditioned state and treatment thereof.  CONDITION ON DISCHARGE:  Satisfactory.  DIET:  Ad lib.  ACTIVITY:  Limit to no strenuous activity. DD:  05/16/00 TD:  05/17/00 Job: [id number]

## 2010-10-15 NOTE — Consult Note (Signed)
[facility]  Patient:    Makayla Rodriguez, Makayla Rodriguez                       MRN: [id number] Proc. Date: 05/02/00 Adm. Date:  [id number] Attending:  Gennie Alma CC:         Milus Mallick, M.D.  Duncan Dull, M.D.   Consultation Report  HISTORY OF PRESENT ILLNESS:  Makayla Rodriguez is a 77 year old white female, born Oct 21, 1934, with a history of obesity and glucose intolerance, who came in for a gallbladder resection and umbilical hernia repair.  Surgery apparently was performed without complications.  This patient received an IV injection of Demerol and Phenergan and apparently had problems with hypoventilation following this event.  Patient was found to be unresponsive and with O2 saturations in the 50% range.  Patient was not intubated, was given Narcan and was transferred to the intensive care unit.  Patient has gradually improved somewhat, initially noted to be decerebrate, but since that time has been able to fully localize to pain and attempt to verbalize. Neurology was asked to see this patient for further evaluation.  PAST MEDICAL HISTORY 1. Status post cholecystectomy and umbilical hernia repair. 2. Hypoventilation -- possible anoxic encephalopathy. 3. Hypertension. 4. Obesity. 5. Glucose intolerance.  CURRENT MEDICATIONS 1. Cipro 400 mg IV q.12h. 2. Patient is on some subcu heparin. 3. Norvasc 5 mg a day. 4. Lasix 20 mg a day. 5. Zestril 10 mg a day.  ALLERGIES:  Patient has allergies to PENICILLIN and CODEINE.  SOCIAL HISTORY:  Patient drinks alcohol on occasion and does not smoke. Patient lives in [place] area and is currently not employed.  FAMILY HISTORY:  Patient is lethargic; full family history could not be obtained.  REVIEW OF SYSTEMS:  Review of systems could not be obtained.  PHYSICAL EXAMINATION  VITALS:  Blood pressure is 140/90.  Heart rate 84.  Respiratory rate 18. Temperature:  Afebrile.  GENERAL:   Patient is a moderately obese white female who is sleepy at the time of examination.  HEENT:  Head is atraumatic.  Eyes:  Pupils are equal, round and reactive to light.  Disks are flat bilaterally.  NECK:  Supple.  No carotid bruits are noted.  RESPIRATORY:  Examination is relatively clear to auscultation and percussion.  CARDIOVASCULAR:  Examination reveals a regular rate and rhythm without obvious murmurs or rubs.  EXTREMITIES:  Without significant edema in the arm, 2+ edema in the legs.  NEUROLOGIC:  Cranial nerves as above.  Facial symmetry is present.  Patient has positive corneal reflexes bilaterally.  Patient is alert enough that she is able to suppress the dolls eyes.  Patient with grimace to nasopharyngeal stimulation.  Deep tendon reflexes are slightly depressed but symmetric.  Toes are upgoing bilaterally.  Patient has increased tone in both legs, more normal tone in the arms.  Patient will clearly localize to sternal rub.  Will moan and mumble verbalizations; will not speak clearly, however, and will not clearly follow commands.  Patient will not cooperate well enough for cerebellar testing; clearly responds to deep pain stimulation on all four extremities.  LABORATORY AND X-RAY FINDINGS:  Laboratory values are notable for a urinalysis with specific gravity of 1.022, pH of 6.0; sodium of 140, potassium of 4.0, chloride of 108, CO2 of 29, glucose of 139, BUN of 19, creatinine of 0.7 and calcium of 10.0; INR of 1.0; white count of 6.8, hemoglobin of 14.5 and hematocrit  of 41.5, MCV of 88.9, platelets of 250; AST of 14, ALT of 12, alkaline phosphatase of 44, total bilirubin of 0.8, albumin 3.2, total protein of 5.7.  EKG reveals sinus tachycardia; otherwise, unremarkable.  Heart rate of 110.  IMPRESSION 1. History of hypoventilation following pain medication administration. 2. Possible anoxic encephalopathy.  This patient has apparently improved significantly from  initial admission to the intensive care unit.  Nursing staff has indicated that the patient was decerebrate initially but now patient is clearly localizing the pain and has attempts to verbalize.  Patient currently has a Glasgow coma scale of 9.  Will follow patients clinical course.  Treatment at this point is supportive.  If patient continues to have significant cognitive impairment 24 hours following the initial event, will consider CT scan of the brain and electroencephalographic study.  Will follow patient while in house. DD:  05/02/00 TD:  05/02/00 Job: [id number]

## 2010-10-15 NOTE — Discharge Summary (Signed)
[facility]  Patient:    Makayla Rodriguez, Makayla Rodriguez                       MRN: [id number] Adm. Date:  [id number] Disc. Date: 05/11/00 Attending:  Selina Cooley Dictator:   Selina Cooley, M.D. CC:         Duncan Dull, M.D.  Milus Mallick, M.D.   Discharge Summary  DISCHARGE DIAGNOSES:  1. Deconditioning.  2.Osteoarthritis (bilateral knees and back).  3. Glucose intolerance.  4. Hypertension.  5. Postoperative anemia, discharge hemoglobin 12.6.  6. Obesity.  7. Mild hypokalemia on Lasix.  8. Status post laparoscopic cholecystectomy and incarcerated umbilical     hernia repair on December 4 by Dr. Orpah Greek complicated by anoxic brain     injury secondary to narcotic oversedation with encephalopathy which has     resolved.  9. Status post tonsillectomy. 10. Status post pilonidal cyst removal at age 75. 31. Status post breast biopsy. 12. History of claustrophobia. 13. Allergy to PENICILLIN and CODEINE.  DISCHARGE MEDICATIONS:  1. Lasix 20 mg p.o. q.d.  2. ______ l 1 tablet p.o. q.d.  3. Norvasc 5 mg p.o. q.d.  4. Lisinopril 10 mg p.o. q.d.  5. Vitamin E 400 international units p.o. q.d.  6. Aspirin 81 mg p.o. q.d.  7. Potassium chloride 20 mEq p.o. q.d.  8. Multivitamin with minerals 1 p.o. q.d.  9. Darvocet 1 q.4-6h. p.o. p.r.n. pain. 10. Colace 100 mg p.o. b.i.d. p.r.n. constipation. 11. Beclomethasone spray, 2 puffs each nostril b.i.d. 12. Tylenol 650 mg every 4 to 6 hours p.r.n. pain.  DIET:  Diabetic, low fat.  FOLLOWUP:  Dr. Kevan Ny Wednesday, May 17, 2000, at 11 a.m. and with Dr. Dayton Scrape in two weeks time.  REASON FOR ADMISSION: The patient is a 77 year old obese white female who underwent an elective laparoscopic cholecystectomy with incarcerated umbilical hernia repair who became oversedated postoperatively with IV Demerol and Phenergan and suffered transient anoxic brain injury.  She was ultimately transferred to the [facility]  unit on December 7 for rehabilitation, generalized weakness, and unsteady gait with pain in her knee secondary to arthritis.  HOSPITAL COURSE:  #1 - DECONDITIONING:  The patient progressed rapidly with physical and occupational therapy and was felt to be safe for discharge to home with home health physical and occupational therapy following.  She is ambulatory and able to take care of ADLs.  #2 - HYPERTENSION:  Blood pressure was stable on all of her usual home medications.  #3 - GLUCOSE INTOLERANCE:  A.M. CBGs were in the low 100s.  Nutrition was invited to provide education consultation regarding dietary choices and adopting an ADA 1800 calorie diet with low fat and low salt.  #4 - OBESITY:  The patient is very motivated to lose weight; however, she is somewhat limited due to her bilateral knee arthritis.  She will continue aquacize classes and attempt to adopt a modified diet.  #5 - HYPOKALEMIA PRESUMED SECONDARY TO ONGOING LASIX:  Will discharge with potassium supplementation.  #6 - POSTOPERATIVE ANEMIA:  Discharge hematocrit stable at 12.6.  #7 - OSTEOARTHRITIS:  She may use Tylenol p.r.n.  PROCEDURES:  During [facility] stay, none.  CONSULTATIONS:  During [facility] stay would include nutrition and PT/OT. DD:  05/10/00 TD:  05/10/00 Job: [id number]

## 2010-10-15 NOTE — Op Note (Signed)
[facility]  Patient:    Makayla Rodriguez, Makayla Rodriguez                       MRN: [id number] Proc. Date: 05/01/00 Adm. Date:  [id number] Attending:  Gennie Alma CC:         Duncan Dull, M.D.   Operative Report  [facility] [place] #:  [id number]  PREOPERATIVE DIAGNOSES: 1. Chronic cholecystitis and cholelithiasis. 2. Chronically incarcerated large umbilical hernia.  OPERATION PERFORMED:  Laparoscopic cholecystectomy and repair of a large umbilical hernia with Gore-Tex mesh.  SURGEON:  Dr. Orpah Greek.  ASSISTANT:  Dr. Lorelee New.  DESCRIPTION OF PROCEDURE:  Under adequate general endotracheal anesthesia, the patients abdomen was prepared and draped in the usual fashion. There was a large umbilical hernia present in this woman who weighed in excess of 250 pounds and had a very large and deep abdomen. A smile incision was made in the inferior portion of the umbilicus just below the umbilical hernia and carried down through the deep subcutaneous tissue. The wounds were coagulated. The hernia mass was retracted superiorly and the deep fascia inferior to the hernia was exposed and opened longitudinally. The peritoneum was entered. A 10 mm Hasson cannula was introduced into the abdomen. Holding sutures of #0 Vicryl were applied to the fascia. The abdomen was then inflated with carbon dioxide to a filling pressure of 15 mmHg with an automatic insufflator. A video laparoscope was introduced. With this being used for direct vision, an upper midline 10 mm port was introduced atraumatically.  The 5 mm subcostal ports could not be initially inserted because of multiple adhesions in the right upper quadrant of the abdomen due to previous surgery. These adhesions were taken down using hot scissors with electrocoagulation introduced through the upper midline port. Once the right upper quadrant was free of adhesions, the two 5 mm ports were inserted. There are actually  3 incisions made because the first attempt was made while the adhesions were still present and had to be abandoned. With all ports now operable, the patient was placed in reverse Trendelenburg position and turned to the left. The gallbladder was grasped with grasping forceps and placed on traction superiorly. We had a good deal of difficulty seeing the hilum of the gallbladder because of the patients size and had to use a 30 degree scope. We were able to identify the cystic duct and had it thoroughly well skeletonized It was quadruply clipped with endoclips and transected between the distal 2 clips. A large branch of the cystic artery was never identified but multiple small arteries were clipped and divided as the gallbladder was dissected out of the gallbladder bed of the liver. This was done using hook and spatula coagulation. Hemostasis was intact. There was a slight entrance made into the gallbladder and for this reason, it was placed in an endocatch bag and withdrawn from the umbilical port incision.  The subhepatic and subphrenic spaces were irrigated with sterile saline solution until clear. The subcostal ports were withdrawn under direct vision and no bleeding was noted. The upper midline port was left in place while the repair of the umbilical hernia was carried out.  The automatic insufflator was discontinued and the abdomen was decompressed. There was a large hernia sac present that was the size of a large orange. It was dissected away from the subcutaneous tissue and from the skin of the umbilicus. The fascia was exposed circumferentially around the entire hernia mass. The  sac was then opened and it was found that there was chronic incarceration of the omentum and as a matter of fact there was more omentum out of the abdomen than could be replaced into the abdomen through the defect. We therefore had to excise a good deal of the omentum over Kelly clamps which were ligated  with 2-0 Vicryl. We were then able to reduce the remaining amount of omentum that was present. The hernia sac was then excised with Bovie electrocoagulation thereby leaving a defect in the fascia that was 8 cm in diameter. This was repaired by inserting dual mesh into the defect into the peritoneal cavity and the preperitoneal position with the smooth side facing the internal organs. The Gore-Tex measured 9 x 15. It was oriented longitudinally. It was fixed to the peritoneum with interrupted vertical mattress sutures of Novofil. This gave a secure closure of the defect. The fascia was closed over the Gore-Tex mesh with interrupted sutures of #0 Novofil and the subcutaneous fat was closed with interrupted sutures of 3-0 Vicryl.  We then used the automatic insufflator to reinflate the abdomen and looked at the internal portion of the Gore-Tex and its deployment. We found that there was some rolling up of the edges and some of the rough side of the gallbladder exposed.  We reinserted a 5 mm port through one of the port incisions and used it to smooth out the Gore-Tex in a preperitoneal position. We then fixed it in that position with the spiral tacker.  This gave very good deployment of the Gore-Tex and the abdomen was deflated and the 5 mm and 10 mm ports were withdrawn.  All skin incisions were closed with the generic skin stapler 35-W. It should be noted that the umbilical incision had to be extended laterally in each direction during the course of the repair of the umbilical hernia in order to allow for adequate exposure. The patient tolerated the procedure well and left the operating room in satisfactory condition. DD:  05/01/00 TD:  05/01/00 Job: [id number]

## 2010-10-15 NOTE — Consult Note (Signed)
[facility]  Patient:    Makayla Rodriguez, Makayla Rodriguez                       MRN: [id number] Proc. Date: 05/02/00 Adm. Date:  [id number] Attending:  Gennie Alma CC:         Milus Mallick, M.D.  Duncan Dull, M.D.   Consultation Report  IDENTIFYING DATA:  Makayla Rodriguez is a 77 year old white female.  She has had problems suffering from chronic obesity, hypertension, chronic cholelithiasis, large umbilical hernia, and glucose intolerance.  She was admitted to [facility] on May 01, 2000, for repair of an increasingly symptomatic incarcerated umbilical hernia and for a laparoscopic cholecystectomy for chronic cholelithiasis.  She had the surgery without incident.  She was recovering well on the floor postop, had been given Demerol and Phenergan IM, and at 12 a.m., she was found unresponsive with respiratory compromise. Initial blood gas pH 7.28, PCO2 50, PO2 171.  O2 saturations had been as low initially as 50, blood pressure as low as 76/50 in her right arm, although with the pressure taken in her left arm, values were more in the range of 106/70.  She was given 100% nonrebreathing mask and nasal airway, also given Narcan, and her breathing significantly improved.  Blood gas after 1 amp of bicarb 7.34, PCO2 48, PO2 138, white count 13,700, hemoglobin 12.7.  EKG is normal.  Chest x-ray revealed subsegmental atelectasis at the bases.  ALLERGIES:  She has had an allergy to PENICILLIN, CODEINE.  MEDICATIONS:  At home chronically: 1. Beconase inhaler. 2. Vitamin E. 3. Aspirin. 4. Norvasc 5 mg a day. 5. Lasix 20 mg a day. 6. Zestril 10 mg a day. 7. PERP-amitriptyline 2-10 mg once a day.  PAST MEDICAL HISTORY: 1. Remarkable for obesity. 2. Hypertension. 3. Chronic cholelithiasis. 4. Large umbilical hernia. 5. Glucose intolerance.  PAST SURGICAL HISTORY: 1. Tonsillectomy. 2. She has had repair of a pilonidal cyst. 3. Breast biopsy in the  past x 2.  FAMILY HISTORY:  Father died at the age of 28 with a ruptured gallbladder. mother died at the age of 70 with a stroke.  Also had heart disease and hypertension.  Sister died age 11 of pancreatic cancer.  REVIEW OF SYSTEMS:  Unobtainable.  PHYSICAL EXAMINATION:  GENERAL:  Obese white female lying in the bed.  VITAL SIGNS:  Blood pressure now 130/70, respiratory rate 24, skin warm and dry.  HEENT:  Pupils 2 mm, minimally reactive, discs are sharp.  NEUROLOGIC:  She does moan and try to mumble some words when deeply stimulated.  She does use purposeful movements with the left hand and grips with the left hand.  Moves the right hand but does not grip and does not appear to be as purposeful as the right.  She does not move either lower extremities.  Deep tendon reflexes 3+ bilaterally.  Toes are upgoing.  HEART:  Regular, tachycardia, without murmur.  LUNGS:  Decreased breath sounds at the left side anteriorly.  ASSESSMENT: 1. Obesity, hypoventilation syndrome exacerbated by pain medication. 2. Anoxic encephalopathy, currently can arouse with deep stimulation, overall    prognosis somewhat indeterminate at this time.  Will simply need to follow    her, support her volume status, and respiratory status, and follow her    neurologic exams to make an assessment as time goes on.  PLAN: 1. Will follow O2 saturations. 2. Hydrate. 3. CT scan of the brain in the  morning. 4. Follow neurologic exam. DD:  05/02/00 TD:  05/02/00 Job: [id number]

## 2011-07-20 DIAGNOSIS — E669 Obesity, unspecified: Secondary | ICD-10-CM | POA: Diagnosis not present

## 2011-07-20 DIAGNOSIS — I10 Essential (primary) hypertension: Secondary | ICD-10-CM | POA: Diagnosis not present

## 2011-07-20 DIAGNOSIS — F411 Generalized anxiety disorder: Secondary | ICD-10-CM | POA: Diagnosis not present

## 2011-07-20 DIAGNOSIS — M1710 Unilateral primary osteoarthritis, unspecified knee: Secondary | ICD-10-CM | POA: Diagnosis not present

## 2011-07-20 DIAGNOSIS — E1129 Type 2 diabetes mellitus with other diabetic kidney complication: Secondary | ICD-10-CM | POA: Diagnosis not present

## 2011-07-20 DIAGNOSIS — K219 Gastro-esophageal reflux disease without esophagitis: Secondary | ICD-10-CM | POA: Diagnosis not present

## 2011-07-20 DIAGNOSIS — E7800 Pure hypercholesterolemia, unspecified: Secondary | ICD-10-CM | POA: Diagnosis not present

## 2011-09-22 DIAGNOSIS — K5901 Slow transit constipation: Secondary | ICD-10-CM | POA: Diagnosis not present

## 2011-09-22 DIAGNOSIS — Z86010 Personal history of colonic polyps: Secondary | ICD-10-CM | POA: Diagnosis not present

## 2011-10-12 DIAGNOSIS — I10 Essential (primary) hypertension: Secondary | ICD-10-CM | POA: Diagnosis not present

## 2011-10-12 DIAGNOSIS — E669 Obesity, unspecified: Secondary | ICD-10-CM | POA: Diagnosis not present

## 2011-10-12 DIAGNOSIS — E7800 Pure hypercholesterolemia, unspecified: Secondary | ICD-10-CM | POA: Diagnosis not present

## 2011-10-12 DIAGNOSIS — R609 Edema, unspecified: Secondary | ICD-10-CM | POA: Diagnosis not present

## 2011-10-12 DIAGNOSIS — F411 Generalized anxiety disorder: Secondary | ICD-10-CM | POA: Diagnosis not present

## 2011-10-12 DIAGNOSIS — E1129 Type 2 diabetes mellitus with other diabetic kidney complication: Secondary | ICD-10-CM | POA: Diagnosis not present

## 2011-10-19 DIAGNOSIS — R609 Edema, unspecified: Secondary | ICD-10-CM | POA: Diagnosis not present

## 2011-10-19 DIAGNOSIS — E1129 Type 2 diabetes mellitus with other diabetic kidney complication: Secondary | ICD-10-CM | POA: Diagnosis not present

## 2011-10-19 DIAGNOSIS — F411 Generalized anxiety disorder: Secondary | ICD-10-CM | POA: Diagnosis not present

## 2011-10-19 DIAGNOSIS — I10 Essential (primary) hypertension: Secondary | ICD-10-CM | POA: Diagnosis not present

## 2011-11-22 DIAGNOSIS — IMO0001 Reserved for inherently not codable concepts without codable children: Secondary | ICD-10-CM | POA: Diagnosis not present

## 2011-11-22 DIAGNOSIS — M545 Low back pain: Secondary | ICD-10-CM | POA: Diagnosis not present

## 2011-11-22 DIAGNOSIS — E1129 Type 2 diabetes mellitus with other diabetic kidney complication: Secondary | ICD-10-CM | POA: Diagnosis not present

## 2011-11-24 DIAGNOSIS — E1129 Type 2 diabetes mellitus with other diabetic kidney complication: Secondary | ICD-10-CM | POA: Diagnosis not present

## 2011-11-24 DIAGNOSIS — M545 Low back pain: Secondary | ICD-10-CM | POA: Diagnosis not present

## 2011-11-24 DIAGNOSIS — IMO0001 Reserved for inherently not codable concepts without codable children: Secondary | ICD-10-CM | POA: Diagnosis not present

## 2011-11-28 DIAGNOSIS — IMO0001 Reserved for inherently not codable concepts without codable children: Secondary | ICD-10-CM | POA: Diagnosis not present

## 2011-11-28 DIAGNOSIS — E1129 Type 2 diabetes mellitus with other diabetic kidney complication: Secondary | ICD-10-CM | POA: Diagnosis not present

## 2011-11-28 DIAGNOSIS — M545 Low back pain: Secondary | ICD-10-CM | POA: Diagnosis not present

## 2011-11-30 DIAGNOSIS — IMO0001 Reserved for inherently not codable concepts without codable children: Secondary | ICD-10-CM | POA: Diagnosis not present

## 2011-11-30 DIAGNOSIS — E1129 Type 2 diabetes mellitus with other diabetic kidney complication: Secondary | ICD-10-CM | POA: Diagnosis not present

## 2011-11-30 DIAGNOSIS — M545 Low back pain: Secondary | ICD-10-CM | POA: Diagnosis not present

## 2011-12-02 DIAGNOSIS — E1129 Type 2 diabetes mellitus with other diabetic kidney complication: Secondary | ICD-10-CM | POA: Diagnosis not present

## 2011-12-02 DIAGNOSIS — M545 Low back pain: Secondary | ICD-10-CM | POA: Diagnosis not present

## 2011-12-02 DIAGNOSIS — IMO0001 Reserved for inherently not codable concepts without codable children: Secondary | ICD-10-CM | POA: Diagnosis not present

## 2011-12-05 DIAGNOSIS — E1129 Type 2 diabetes mellitus with other diabetic kidney complication: Secondary | ICD-10-CM | POA: Diagnosis not present

## 2011-12-05 DIAGNOSIS — M545 Low back pain: Secondary | ICD-10-CM | POA: Diagnosis not present

## 2011-12-05 DIAGNOSIS — IMO0001 Reserved for inherently not codable concepts without codable children: Secondary | ICD-10-CM | POA: Diagnosis not present

## 2011-12-07 DIAGNOSIS — M545 Low back pain: Secondary | ICD-10-CM | POA: Diagnosis not present

## 2011-12-07 DIAGNOSIS — IMO0001 Reserved for inherently not codable concepts without codable children: Secondary | ICD-10-CM | POA: Diagnosis not present

## 2011-12-07 DIAGNOSIS — E1129 Type 2 diabetes mellitus with other diabetic kidney complication: Secondary | ICD-10-CM | POA: Diagnosis not present

## 2011-12-09 DIAGNOSIS — M545 Low back pain: Secondary | ICD-10-CM | POA: Diagnosis not present

## 2011-12-09 DIAGNOSIS — IMO0001 Reserved for inherently not codable concepts without codable children: Secondary | ICD-10-CM | POA: Diagnosis not present

## 2011-12-09 DIAGNOSIS — E1129 Type 2 diabetes mellitus with other diabetic kidney complication: Secondary | ICD-10-CM | POA: Diagnosis not present

## 2011-12-12 DIAGNOSIS — IMO0001 Reserved for inherently not codable concepts without codable children: Secondary | ICD-10-CM | POA: Diagnosis not present

## 2011-12-12 DIAGNOSIS — M545 Low back pain: Secondary | ICD-10-CM | POA: Diagnosis not present

## 2011-12-12 DIAGNOSIS — E1129 Type 2 diabetes mellitus with other diabetic kidney complication: Secondary | ICD-10-CM | POA: Diagnosis not present

## 2011-12-14 DIAGNOSIS — M545 Low back pain: Secondary | ICD-10-CM | POA: Diagnosis not present

## 2011-12-14 DIAGNOSIS — E1129 Type 2 diabetes mellitus with other diabetic kidney complication: Secondary | ICD-10-CM | POA: Diagnosis not present

## 2011-12-14 DIAGNOSIS — IMO0001 Reserved for inherently not codable concepts without codable children: Secondary | ICD-10-CM | POA: Diagnosis not present

## 2011-12-16 DIAGNOSIS — E1129 Type 2 diabetes mellitus with other diabetic kidney complication: Secondary | ICD-10-CM | POA: Diagnosis not present

## 2011-12-16 DIAGNOSIS — M545 Low back pain: Secondary | ICD-10-CM | POA: Diagnosis not present

## 2011-12-16 DIAGNOSIS — IMO0001 Reserved for inherently not codable concepts without codable children: Secondary | ICD-10-CM | POA: Diagnosis not present

## 2011-12-21 DIAGNOSIS — E1129 Type 2 diabetes mellitus with other diabetic kidney complication: Secondary | ICD-10-CM | POA: Diagnosis not present

## 2011-12-21 DIAGNOSIS — IMO0001 Reserved for inherently not codable concepts without codable children: Secondary | ICD-10-CM | POA: Diagnosis not present

## 2011-12-21 DIAGNOSIS — M545 Low back pain: Secondary | ICD-10-CM | POA: Diagnosis not present

## 2011-12-22 DIAGNOSIS — IMO0001 Reserved for inherently not codable concepts without codable children: Secondary | ICD-10-CM | POA: Diagnosis not present

## 2011-12-22 DIAGNOSIS — M545 Low back pain: Secondary | ICD-10-CM | POA: Diagnosis not present

## 2011-12-22 DIAGNOSIS — E1129 Type 2 diabetes mellitus with other diabetic kidney complication: Secondary | ICD-10-CM | POA: Diagnosis not present

## 2011-12-23 DIAGNOSIS — M65979 Unspecified synovitis and tenosynovitis, unspecified ankle and foot: Secondary | ICD-10-CM | POA: Diagnosis not present

## 2011-12-23 DIAGNOSIS — M12279 Villonodular synovitis (pigmented), unspecified ankle and foot: Secondary | ICD-10-CM | POA: Diagnosis not present

## 2011-12-23 DIAGNOSIS — M79609 Pain in unspecified limb: Secondary | ICD-10-CM | POA: Diagnosis not present

## 2011-12-23 DIAGNOSIS — M25579 Pain in unspecified ankle and joints of unspecified foot: Secondary | ICD-10-CM | POA: Diagnosis not present

## 2011-12-23 DIAGNOSIS — M659 Synovitis and tenosynovitis, unspecified: Secondary | ICD-10-CM | POA: Diagnosis not present

## 2011-12-27 DIAGNOSIS — IMO0001 Reserved for inherently not codable concepts without codable children: Secondary | ICD-10-CM | POA: Diagnosis not present

## 2011-12-27 DIAGNOSIS — E1129 Type 2 diabetes mellitus with other diabetic kidney complication: Secondary | ICD-10-CM | POA: Diagnosis not present

## 2011-12-27 DIAGNOSIS — M545 Low back pain: Secondary | ICD-10-CM | POA: Diagnosis not present

## 2011-12-28 DIAGNOSIS — IMO0001 Reserved for inherently not codable concepts without codable children: Secondary | ICD-10-CM | POA: Diagnosis not present

## 2011-12-28 DIAGNOSIS — E1129 Type 2 diabetes mellitus with other diabetic kidney complication: Secondary | ICD-10-CM | POA: Diagnosis not present

## 2011-12-28 DIAGNOSIS — M545 Low back pain: Secondary | ICD-10-CM | POA: Diagnosis not present

## 2011-12-29 DIAGNOSIS — R609 Edema, unspecified: Secondary | ICD-10-CM | POA: Diagnosis not present

## 2011-12-29 DIAGNOSIS — M1710 Unilateral primary osteoarthritis, unspecified knee: Secondary | ICD-10-CM | POA: Diagnosis not present

## 2011-12-29 DIAGNOSIS — E1129 Type 2 diabetes mellitus with other diabetic kidney complication: Secondary | ICD-10-CM | POA: Diagnosis not present

## 2011-12-29 DIAGNOSIS — I10 Essential (primary) hypertension: Secondary | ICD-10-CM | POA: Diagnosis not present

## 2011-12-29 DIAGNOSIS — E669 Obesity, unspecified: Secondary | ICD-10-CM | POA: Diagnosis not present

## 2011-12-30 DIAGNOSIS — E1129 Type 2 diabetes mellitus with other diabetic kidney complication: Secondary | ICD-10-CM | POA: Diagnosis not present

## 2011-12-30 DIAGNOSIS — IMO0001 Reserved for inherently not codable concepts without codable children: Secondary | ICD-10-CM | POA: Diagnosis not present

## 2011-12-30 DIAGNOSIS — M545 Low back pain: Secondary | ICD-10-CM | POA: Diagnosis not present

## 2012-01-03 DIAGNOSIS — E1129 Type 2 diabetes mellitus with other diabetic kidney complication: Secondary | ICD-10-CM | POA: Diagnosis not present

## 2012-01-03 DIAGNOSIS — IMO0001 Reserved for inherently not codable concepts without codable children: Secondary | ICD-10-CM | POA: Diagnosis not present

## 2012-01-03 DIAGNOSIS — M545 Low back pain: Secondary | ICD-10-CM | POA: Diagnosis not present

## 2012-01-04 DIAGNOSIS — M545 Low back pain: Secondary | ICD-10-CM | POA: Diagnosis not present

## 2012-01-04 DIAGNOSIS — E1129 Type 2 diabetes mellitus with other diabetic kidney complication: Secondary | ICD-10-CM | POA: Diagnosis not present

## 2012-01-04 DIAGNOSIS — IMO0001 Reserved for inherently not codable concepts without codable children: Secondary | ICD-10-CM | POA: Diagnosis not present

## 2012-01-05 DIAGNOSIS — E1129 Type 2 diabetes mellitus with other diabetic kidney complication: Secondary | ICD-10-CM | POA: Diagnosis not present

## 2012-01-05 DIAGNOSIS — M545 Low back pain: Secondary | ICD-10-CM | POA: Diagnosis not present

## 2012-01-05 DIAGNOSIS — IMO0001 Reserved for inherently not codable concepts without codable children: Secondary | ICD-10-CM | POA: Diagnosis not present

## 2012-01-06 DIAGNOSIS — M65979 Unspecified synovitis and tenosynovitis, unspecified ankle and foot: Secondary | ICD-10-CM | POA: Diagnosis not present

## 2012-01-06 DIAGNOSIS — G909 Disorder of the autonomic nervous system, unspecified: Secondary | ICD-10-CM | POA: Diagnosis not present

## 2012-01-06 DIAGNOSIS — M12279 Villonodular synovitis (pigmented), unspecified ankle and foot: Secondary | ICD-10-CM | POA: Diagnosis not present

## 2012-01-06 DIAGNOSIS — M659 Synovitis and tenosynovitis, unspecified: Secondary | ICD-10-CM | POA: Diagnosis not present

## 2012-01-10 DIAGNOSIS — IMO0001 Reserved for inherently not codable concepts without codable children: Secondary | ICD-10-CM | POA: Diagnosis not present

## 2012-01-10 DIAGNOSIS — E1129 Type 2 diabetes mellitus with other diabetic kidney complication: Secondary | ICD-10-CM | POA: Diagnosis not present

## 2012-01-10 DIAGNOSIS — M545 Low back pain: Secondary | ICD-10-CM | POA: Diagnosis not present

## 2012-01-17 DIAGNOSIS — E1129 Type 2 diabetes mellitus with other diabetic kidney complication: Secondary | ICD-10-CM | POA: Diagnosis not present

## 2012-01-17 DIAGNOSIS — M545 Low back pain: Secondary | ICD-10-CM | POA: Diagnosis not present

## 2012-01-17 DIAGNOSIS — IMO0001 Reserved for inherently not codable concepts without codable children: Secondary | ICD-10-CM | POA: Diagnosis not present

## 2012-01-19 DIAGNOSIS — M545 Low back pain: Secondary | ICD-10-CM | POA: Diagnosis not present

## 2012-01-19 DIAGNOSIS — E1129 Type 2 diabetes mellitus with other diabetic kidney complication: Secondary | ICD-10-CM | POA: Diagnosis not present

## 2012-01-19 DIAGNOSIS — IMO0001 Reserved for inherently not codable concepts without codable children: Secondary | ICD-10-CM | POA: Diagnosis not present

## 2012-01-21 DIAGNOSIS — M79609 Pain in unspecified limb: Secondary | ICD-10-CM | POA: Diagnosis not present

## 2012-01-21 DIAGNOSIS — IMO0001 Reserved for inherently not codable concepts without codable children: Secondary | ICD-10-CM | POA: Diagnosis not present

## 2012-01-21 DIAGNOSIS — E1129 Type 2 diabetes mellitus with other diabetic kidney complication: Secondary | ICD-10-CM | POA: Diagnosis not present

## 2012-01-21 DIAGNOSIS — H543 Unqualified visual loss, both eyes: Secondary | ICD-10-CM | POA: Diagnosis not present

## 2012-01-24 DIAGNOSIS — H543 Unqualified visual loss, both eyes: Secondary | ICD-10-CM | POA: Diagnosis not present

## 2012-01-24 DIAGNOSIS — M79609 Pain in unspecified limb: Secondary | ICD-10-CM | POA: Diagnosis not present

## 2012-01-24 DIAGNOSIS — E1129 Type 2 diabetes mellitus with other diabetic kidney complication: Secondary | ICD-10-CM | POA: Diagnosis not present

## 2012-01-24 DIAGNOSIS — IMO0001 Reserved for inherently not codable concepts without codable children: Secondary | ICD-10-CM | POA: Diagnosis not present

## 2012-02-08 DIAGNOSIS — R0602 Shortness of breath: Secondary | ICD-10-CM | POA: Diagnosis not present

## 2012-02-08 DIAGNOSIS — J40 Bronchitis, not specified as acute or chronic: Secondary | ICD-10-CM | POA: Diagnosis not present

## 2012-02-08 DIAGNOSIS — R062 Wheezing: Secondary | ICD-10-CM | POA: Diagnosis not present

## 2012-02-14 DIAGNOSIS — H543 Unqualified visual loss, both eyes: Secondary | ICD-10-CM | POA: Diagnosis not present

## 2012-02-14 DIAGNOSIS — M79609 Pain in unspecified limb: Secondary | ICD-10-CM | POA: Diagnosis not present

## 2012-02-14 DIAGNOSIS — E1129 Type 2 diabetes mellitus with other diabetic kidney complication: Secondary | ICD-10-CM | POA: Diagnosis not present

## 2012-02-14 DIAGNOSIS — IMO0001 Reserved for inherently not codable concepts without codable children: Secondary | ICD-10-CM | POA: Diagnosis not present

## 2012-02-17 DIAGNOSIS — M79609 Pain in unspecified limb: Secondary | ICD-10-CM | POA: Diagnosis not present

## 2012-02-17 DIAGNOSIS — E1129 Type 2 diabetes mellitus with other diabetic kidney complication: Secondary | ICD-10-CM | POA: Diagnosis not present

## 2012-02-17 DIAGNOSIS — IMO0001 Reserved for inherently not codable concepts without codable children: Secondary | ICD-10-CM | POA: Diagnosis not present

## 2012-02-17 DIAGNOSIS — H543 Unqualified visual loss, both eyes: Secondary | ICD-10-CM | POA: Diagnosis not present

## 2012-02-20 DIAGNOSIS — E1159 Type 2 diabetes mellitus with other circulatory complications: Secondary | ICD-10-CM | POA: Diagnosis not present

## 2012-02-21 DIAGNOSIS — E1129 Type 2 diabetes mellitus with other diabetic kidney complication: Secondary | ICD-10-CM | POA: Diagnosis not present

## 2012-02-21 DIAGNOSIS — H543 Unqualified visual loss, both eyes: Secondary | ICD-10-CM | POA: Diagnosis not present

## 2012-02-21 DIAGNOSIS — IMO0001 Reserved for inherently not codable concepts without codable children: Secondary | ICD-10-CM | POA: Diagnosis not present

## 2012-02-21 DIAGNOSIS — M79609 Pain in unspecified limb: Secondary | ICD-10-CM | POA: Diagnosis not present

## 2012-02-24 DIAGNOSIS — IMO0001 Reserved for inherently not codable concepts without codable children: Secondary | ICD-10-CM | POA: Diagnosis not present

## 2012-02-24 DIAGNOSIS — H543 Unqualified visual loss, both eyes: Secondary | ICD-10-CM | POA: Diagnosis not present

## 2012-02-24 DIAGNOSIS — E1129 Type 2 diabetes mellitus with other diabetic kidney complication: Secondary | ICD-10-CM | POA: Diagnosis not present

## 2012-02-24 DIAGNOSIS — M79609 Pain in unspecified limb: Secondary | ICD-10-CM | POA: Diagnosis not present

## 2012-02-28 DIAGNOSIS — E1129 Type 2 diabetes mellitus with other diabetic kidney complication: Secondary | ICD-10-CM | POA: Diagnosis not present

## 2012-02-28 DIAGNOSIS — H543 Unqualified visual loss, both eyes: Secondary | ICD-10-CM | POA: Diagnosis not present

## 2012-02-28 DIAGNOSIS — M79609 Pain in unspecified limb: Secondary | ICD-10-CM | POA: Diagnosis not present

## 2012-02-28 DIAGNOSIS — IMO0001 Reserved for inherently not codable concepts without codable children: Secondary | ICD-10-CM | POA: Diagnosis not present

## 2012-03-01 DIAGNOSIS — H543 Unqualified visual loss, both eyes: Secondary | ICD-10-CM | POA: Diagnosis not present

## 2012-03-01 DIAGNOSIS — IMO0001 Reserved for inherently not codable concepts without codable children: Secondary | ICD-10-CM | POA: Diagnosis not present

## 2012-03-01 DIAGNOSIS — E1129 Type 2 diabetes mellitus with other diabetic kidney complication: Secondary | ICD-10-CM | POA: Diagnosis not present

## 2012-03-01 DIAGNOSIS — M79609 Pain in unspecified limb: Secondary | ICD-10-CM | POA: Diagnosis not present

## 2012-03-06 DIAGNOSIS — E1129 Type 2 diabetes mellitus with other diabetic kidney complication: Secondary | ICD-10-CM | POA: Diagnosis not present

## 2012-03-06 DIAGNOSIS — H543 Unqualified visual loss, both eyes: Secondary | ICD-10-CM | POA: Diagnosis not present

## 2012-03-06 DIAGNOSIS — IMO0001 Reserved for inherently not codable concepts without codable children: Secondary | ICD-10-CM | POA: Diagnosis not present

## 2012-03-06 DIAGNOSIS — M79609 Pain in unspecified limb: Secondary | ICD-10-CM | POA: Diagnosis not present

## 2012-03-09 DIAGNOSIS — E1129 Type 2 diabetes mellitus with other diabetic kidney complication: Secondary | ICD-10-CM | POA: Diagnosis not present

## 2012-03-09 DIAGNOSIS — IMO0001 Reserved for inherently not codable concepts without codable children: Secondary | ICD-10-CM | POA: Diagnosis not present

## 2012-03-09 DIAGNOSIS — H543 Unqualified visual loss, both eyes: Secondary | ICD-10-CM | POA: Diagnosis not present

## 2012-03-09 DIAGNOSIS — M79609 Pain in unspecified limb: Secondary | ICD-10-CM | POA: Diagnosis not present

## 2012-03-13 DIAGNOSIS — H543 Unqualified visual loss, both eyes: Secondary | ICD-10-CM | POA: Diagnosis not present

## 2012-03-13 DIAGNOSIS — M79609 Pain in unspecified limb: Secondary | ICD-10-CM | POA: Diagnosis not present

## 2012-03-13 DIAGNOSIS — E1129 Type 2 diabetes mellitus with other diabetic kidney complication: Secondary | ICD-10-CM | POA: Diagnosis not present

## 2012-03-13 DIAGNOSIS — IMO0001 Reserved for inherently not codable concepts without codable children: Secondary | ICD-10-CM | POA: Diagnosis not present

## 2012-03-16 DIAGNOSIS — E1129 Type 2 diabetes mellitus with other diabetic kidney complication: Secondary | ICD-10-CM | POA: Diagnosis not present

## 2012-03-16 DIAGNOSIS — H543 Unqualified visual loss, both eyes: Secondary | ICD-10-CM | POA: Diagnosis not present

## 2012-03-16 DIAGNOSIS — M79609 Pain in unspecified limb: Secondary | ICD-10-CM | POA: Diagnosis not present

## 2012-03-16 DIAGNOSIS — IMO0001 Reserved for inherently not codable concepts without codable children: Secondary | ICD-10-CM | POA: Diagnosis not present

## 2012-03-21 DIAGNOSIS — I10 Essential (primary) hypertension: Secondary | ICD-10-CM | POA: Diagnosis not present

## 2012-03-21 DIAGNOSIS — F411 Generalized anxiety disorder: Secondary | ICD-10-CM | POA: Diagnosis not present

## 2012-03-21 DIAGNOSIS — Z23 Encounter for immunization: Secondary | ICD-10-CM | POA: Diagnosis not present

## 2012-03-21 DIAGNOSIS — E7800 Pure hypercholesterolemia, unspecified: Secondary | ICD-10-CM | POA: Diagnosis not present

## 2012-03-21 DIAGNOSIS — J301 Allergic rhinitis due to pollen: Secondary | ICD-10-CM | POA: Diagnosis not present

## 2012-03-21 DIAGNOSIS — E1129 Type 2 diabetes mellitus with other diabetic kidney complication: Secondary | ICD-10-CM | POA: Diagnosis not present

## 2012-03-21 DIAGNOSIS — R609 Edema, unspecified: Secondary | ICD-10-CM | POA: Diagnosis not present

## 2012-03-26 ENCOUNTER — Other Ambulatory Visit: Payer: Self-pay | Admitting: Family Medicine

## 2012-03-26 DIAGNOSIS — M79606 Pain in leg, unspecified: Secondary | ICD-10-CM

## 2012-03-28 ENCOUNTER — Ambulatory Visit
Admission: RE | Admit: 2012-03-28 | Discharge: 2012-03-28 | Disposition: A | Discharge status: Home / Self Care | Payer: Medicare Other | Source: Ambulatory Visit | Attending: Family Medicine | Admitting: Family Medicine

## 2012-03-28 DIAGNOSIS — M79606 Pain in leg, unspecified: Secondary | ICD-10-CM

## 2012-03-28 DIAGNOSIS — M79609 Pain in unspecified limb: Secondary | ICD-10-CM | POA: Diagnosis not present

## 2012-05-11 ENCOUNTER — Inpatient Hospital Stay (HOSPITAL_COMMUNITY)
Admission: EM | Admit: 2012-05-11 | Discharge: 2012-05-21 | DRG: 291 | Disposition: A | Discharge status: Skilled Nursing Facility | Payer: Medicare Other | Attending: Internal Medicine | Admitting: Internal Medicine

## 2012-05-11 ENCOUNTER — Emergency Department (HOSPITAL_COMMUNITY): Payer: Medicare Other

## 2012-05-11 ENCOUNTER — Encounter (HOSPITAL_COMMUNITY): Payer: Self-pay | Admitting: Emergency Medicine

## 2012-05-11 DIAGNOSIS — R5381 Other malaise: Secondary | ICD-10-CM | POA: Diagnosis present

## 2012-05-11 DIAGNOSIS — J811 Chronic pulmonary edema: Secondary | ICD-10-CM | POA: Diagnosis not present

## 2012-05-11 DIAGNOSIS — E1169 Type 2 diabetes mellitus with other specified complication: Secondary | ICD-10-CM | POA: Diagnosis present

## 2012-05-11 DIAGNOSIS — F411 Generalized anxiety disorder: Secondary | ICD-10-CM | POA: Diagnosis present

## 2012-05-11 DIAGNOSIS — G4733 Obstructive sleep apnea (adult) (pediatric): Secondary | ICD-10-CM | POA: Diagnosis present

## 2012-05-11 DIAGNOSIS — I279 Pulmonary heart disease, unspecified: Secondary | ICD-10-CM | POA: Diagnosis present

## 2012-05-11 DIAGNOSIS — R222 Localized swelling, mass and lump, trunk: Secondary | ICD-10-CM | POA: Diagnosis present

## 2012-05-11 DIAGNOSIS — N183 Chronic kidney disease, stage 3 unspecified: Secondary | ICD-10-CM | POA: Diagnosis not present

## 2012-05-11 DIAGNOSIS — Z6841 Body Mass Index (BMI) 40.0 and over, adult: Secondary | ICD-10-CM | POA: Diagnosis not present

## 2012-05-11 DIAGNOSIS — I10 Essential (primary) hypertension: Secondary | ICD-10-CM | POA: Diagnosis not present

## 2012-05-11 DIAGNOSIS — E119 Type 2 diabetes mellitus without complications: Secondary | ICD-10-CM | POA: Diagnosis not present

## 2012-05-11 DIAGNOSIS — E662 Morbid (severe) obesity with alveolar hypoventilation: Secondary | ICD-10-CM | POA: Diagnosis present

## 2012-05-11 DIAGNOSIS — M19079 Primary osteoarthritis, unspecified ankle and foot: Secondary | ICD-10-CM

## 2012-05-11 DIAGNOSIS — J029 Acute pharyngitis, unspecified: Secondary | ICD-10-CM | POA: Diagnosis not present

## 2012-05-11 DIAGNOSIS — E1129 Type 2 diabetes mellitus with other diabetic kidney complication: Secondary | ICD-10-CM | POA: Diagnosis not present

## 2012-05-11 DIAGNOSIS — N289 Disorder of kidney and ureter, unspecified: Secondary | ICD-10-CM | POA: Diagnosis present

## 2012-05-11 DIAGNOSIS — F329 Major depressive disorder, single episode, unspecified: Secondary | ICD-10-CM | POA: Diagnosis present

## 2012-05-11 DIAGNOSIS — Z79899 Other long term (current) drug therapy: Secondary | ICD-10-CM | POA: Diagnosis not present

## 2012-05-11 DIAGNOSIS — R0609 Other forms of dyspnea: Secondary | ICD-10-CM | POA: Diagnosis not present

## 2012-05-11 DIAGNOSIS — R6889 Other general symptoms and signs: Secondary | ICD-10-CM | POA: Diagnosis not present

## 2012-05-11 DIAGNOSIS — F3289 Other specified depressive episodes: Secondary | ICD-10-CM | POA: Diagnosis present

## 2012-05-11 DIAGNOSIS — J948 Other specified pleural conditions: Secondary | ICD-10-CM

## 2012-05-11 DIAGNOSIS — I5031 Acute diastolic (congestive) heart failure: Principal | ICD-10-CM

## 2012-05-11 DIAGNOSIS — R0602 Shortness of breath: Secondary | ICD-10-CM | POA: Diagnosis not present

## 2012-05-11 DIAGNOSIS — J9819 Other pulmonary collapse: Secondary | ICD-10-CM | POA: Diagnosis not present

## 2012-05-11 DIAGNOSIS — I129 Hypertensive chronic kidney disease with stage 1 through stage 4 chronic kidney disease, or unspecified chronic kidney disease: Secondary | ICD-10-CM | POA: Diagnosis present

## 2012-05-11 DIAGNOSIS — R0902 Hypoxemia: Secondary | ICD-10-CM | POA: Diagnosis not present

## 2012-05-11 DIAGNOSIS — J9600 Acute respiratory failure, unspecified whether with hypoxia or hypercapnia: Secondary | ICD-10-CM | POA: Diagnosis not present

## 2012-05-11 DIAGNOSIS — J398 Other specified diseases of upper respiratory tract: Secondary | ICD-10-CM | POA: Diagnosis not present

## 2012-05-11 DIAGNOSIS — I509 Heart failure, unspecified: Secondary | ICD-10-CM | POA: Diagnosis not present

## 2012-05-11 DIAGNOSIS — I5033 Acute on chronic diastolic (congestive) heart failure: Secondary | ICD-10-CM | POA: Diagnosis present

## 2012-05-11 HISTORY — DX: Unspecified osteoarthritis, unspecified site: M19.90

## 2012-05-11 HISTORY — DX: Chronic kidney disease, stage 3 unspecified: N18.30

## 2012-05-11 HISTORY — DX: Essential (primary) hypertension: I10

## 2012-05-11 HISTORY — DX: Anxiety disorder, unspecified: F41.9

## 2012-05-11 HISTORY — DX: Obesity, unspecified: E66.9

## 2012-05-11 HISTORY — DX: Chronic kidney disease, stage 3 (moderate): N18.3

## 2012-05-11 HISTORY — DX: Type 2 diabetes mellitus without complications: E11.9

## 2012-05-11 LAB — BASIC METABOLIC PANEL
BUN: 26 mg/dL — ABNORMAL HIGH (ref 6–23)
Calcium: 9.6 mg/dL (ref 8.4–10.5)
Chloride: 98 mEq/L (ref 96–112)
Creatinine, Ser: 0.89 mg/dL (ref 0.50–1.10)
GFR calc Af Amer: 70 mL/min — ABNORMAL LOW (ref >90)
GFR calc non Af Amer: 60 mL/min — ABNORMAL LOW (ref >90)

## 2012-05-11 LAB — GLUCOSE, CAPILLARY: Glucose-Capillary: 171 mg/dL — ABNORMAL HIGH (ref 70–99)

## 2012-05-11 LAB — CBC WITH DIFFERENTIAL/PLATELET
Basophils Absolute: 0.1 10*3/uL (ref 0.0–0.1)
Basophils Relative: 1 % (ref 0–1)
Eosinophils Absolute: 0.3 10*3/uL (ref 0.0–0.7)
HCT: 39.6 % (ref 36.0–46.0)
MCH: 29.3 pg (ref 26.0–34.0)
MCHC: 31.3 g/dL (ref 30.0–36.0)
Monocytes Absolute: 0.8 10*3/uL (ref 0.1–1.0)
Neutro Abs: 5.3 10*3/uL (ref 1.7–7.7)
RDW: 13.3 % (ref 11.5–15.5)

## 2012-05-11 LAB — CBC
HCT: 37.2 % (ref 36.0–46.0)
Platelets: 235 10*3/uL (ref 150–400)
RDW: 13.3 % (ref 11.5–15.5)
WBC: 7.8 10*3/uL (ref 4.0–10.5)

## 2012-05-11 LAB — URINALYSIS, ROUTINE W REFLEX MICROSCOPIC
Leukocytes, UA: NEGATIVE
Nitrite: NEGATIVE
Specific Gravity, Urine: 1.01 (ref 1.005–1.030)
Urobilinogen, UA: 0.2 mg/dL (ref 0.0–1.0)
pH: 7.5 (ref 5.0–8.0)

## 2012-05-11 LAB — COMPREHENSIVE METABOLIC PANEL
Albumin: 2.9 g/dL — ABNORMAL LOW (ref 3.5–5.2)
Alkaline Phosphatase: 58 U/L (ref 39–117)
BUN: 28 mg/dL — ABNORMAL HIGH (ref 6–23)
Potassium: 4.3 mEq/L (ref 3.5–5.1)
Sodium: 137 mEq/L (ref 135–145)
Total Protein: 6.3 g/dL (ref 6.0–8.3)

## 2012-05-11 LAB — TROPONIN I: Troponin I: <0.3 ng/mL (ref <0.30)

## 2012-05-11 MED ORDER — FUROSEMIDE 10 MG/ML IJ SOLN
40.0000 mg | Freq: Once | INTRAMUSCULAR | Status: AC
  Administered 2012-05-11: 40 mg via INTRAVENOUS
  Filled 2012-05-11: qty 4

## 2012-05-11 MED ORDER — HEPARIN SODIUM (PORCINE) 5000 UNIT/ML IJ SOLN
5000.0000 [IU] | Freq: Three times a day (TID) | INTRAMUSCULAR | Status: DC
  Administered 2012-05-11 – 2012-05-21 (×30): 5000 [IU] via SUBCUTANEOUS
  Filled 2012-05-11 (×33): qty 1

## 2012-05-11 MED ORDER — CYCLOBENZAPRINE HCL 10 MG PO TABS
10.0000 mg | ORAL_TABLET | Freq: Three times a day (TID) | ORAL | Status: DC | PRN
  Filled 2012-05-11: qty 1

## 2012-05-11 MED ORDER — INSULIN ASPART 100 UNIT/ML ~~LOC~~ SOLN
0.0000 [IU] | Freq: Every day | SUBCUTANEOUS | Status: DC
  Administered 2012-05-20: 2 [IU] via SUBCUTANEOUS

## 2012-05-11 MED ORDER — DOCUSATE SODIUM 100 MG PO CAPS
100.0000 mg | ORAL_CAPSULE | Freq: Every evening | ORAL | Status: DC
  Administered 2012-05-11 – 2012-05-20 (×9): 100 mg via ORAL
  Filled 2012-05-11 (×12): qty 1

## 2012-05-11 MED ORDER — INSULIN ASPART 100 UNIT/ML ~~LOC~~ SOLN
0.0000 [IU] | Freq: Three times a day (TID) | SUBCUTANEOUS | Status: DC
  Administered 2012-05-13 – 2012-05-16 (×6): 2 [IU] via SUBCUTANEOUS
  Administered 2012-05-17: 3 [IU] via SUBCUTANEOUS
  Administered 2012-05-17: 2 [IU] via SUBCUTANEOUS
  Administered 2012-05-18: 3 [IU] via SUBCUTANEOUS
  Administered 2012-05-18 – 2012-05-19 (×4): 2 [IU] via SUBCUTANEOUS
  Administered 2012-05-19 – 2012-05-21 (×6): 3 [IU] via SUBCUTANEOUS

## 2012-05-11 MED ORDER — ONDANSETRON HCL 4 MG/2ML IJ SOLN: 4.0000 mg | Freq: Four times a day (QID) | INTRAMUSCULAR | Status: DC | PRN

## 2012-05-11 MED ORDER — FUROSEMIDE 10 MG/ML IJ SOLN
40.0000 mg | Freq: Three times a day (TID) | INTRAMUSCULAR | Status: DC
  Administered 2012-05-11 – 2012-05-13 (×5): 40 mg via INTRAVENOUS
  Filled 2012-05-11 (×8): qty 4

## 2012-05-11 MED ORDER — FLUTICASONE PROPIONATE 50 MCG/ACT NA SUSP
2.0000 | Freq: Every day | NASAL | Status: DC
  Administered 2012-05-12 – 2012-05-21 (×9): 2 via NASAL
  Filled 2012-05-11 (×2): qty 16

## 2012-05-11 MED ORDER — POLYETHYL GLYCOL-PROPYL GLYCOL 0.4-0.3 % OP SOLN: 2.0000 [drp] | Freq: Two times a day (BID) | OPHTHALMIC | Status: DC

## 2012-05-11 MED ORDER — POLYVINYL ALCOHOL 1.4 % OP SOLN
2.0000 [drp] | Freq: Two times a day (BID) | OPHTHALMIC | Status: DC
  Administered 2012-05-12 – 2012-05-21 (×20): 2 [drp] via OPHTHALMIC
  Filled 2012-05-11 (×2): qty 15

## 2012-05-11 MED ORDER — BUSPIRONE HCL 15 MG PO TABS
15.0000 mg | ORAL_TABLET | Freq: Two times a day (BID) | ORAL | Status: DC
  Administered 2012-05-11 – 2012-05-13 (×4): 15 mg via ORAL
  Filled 2012-05-11 (×5): qty 1

## 2012-05-11 MED ORDER — PANTOPRAZOLE SODIUM 40 MG PO TBEC
40.0000 mg | DELAYED_RELEASE_TABLET | Freq: Every morning | ORAL | Status: DC
  Administered 2012-05-12 – 2012-05-21 (×10): 40 mg via ORAL
  Filled 2012-05-11 (×11): qty 1

## 2012-05-11 MED ORDER — ALPRAZOLAM 0.25 MG PO TABS
0.2500 mg | ORAL_TABLET | Freq: Two times a day (BID) | ORAL | Status: DC | PRN
  Administered 2012-05-13: 0.25 mg via ORAL
  Filled 2012-05-11: qty 1

## 2012-05-11 MED ORDER — ACETAMINOPHEN 325 MG PO TABS: 650.0000 mg | ORAL_TABLET | ORAL | Status: DC | PRN

## 2012-05-11 MED ORDER — SODIUM CHLORIDE 0.9 % IJ SOLN
3.0000 mL | Freq: Two times a day (BID) | INTRAMUSCULAR | Status: DC
  Administered 2012-05-11 – 2012-05-15 (×8): 3 mL via INTRAVENOUS
  Administered 2012-05-15: 22:00:00 via INTRAVENOUS
  Administered 2012-05-16 – 2012-05-20 (×10): 3 mL via INTRAVENOUS

## 2012-05-11 MED ORDER — GLIPIZIDE ER 5 MG PO TB24
5.0000 mg | ORAL_TABLET | Freq: Every morning | ORAL | Status: DC
  Filled 2012-05-11: qty 1

## 2012-05-11 MED ORDER — ATORVASTATIN CALCIUM 10 MG PO TABS
10.0000 mg | ORAL_TABLET | Freq: Every day | ORAL | Status: DC
  Administered 2012-05-11 – 2012-05-20 (×10): 10 mg via ORAL
  Filled 2012-05-11 (×12): qty 1

## 2012-05-11 MED ORDER — LISINOPRIL 40 MG PO TABS
40.0000 mg | ORAL_TABLET | Freq: Every morning | ORAL | Status: DC
  Filled 2012-05-11: qty 1

## 2012-05-11 MED ORDER — SODIUM CHLORIDE 0.9 % IV SOLN: 250.0000 mL | INTRAVENOUS | Status: DC | PRN

## 2012-05-11 MED ORDER — ASPIRIN EC 81 MG PO TBEC
81.0000 mg | DELAYED_RELEASE_TABLET | Freq: Every morning | ORAL | Status: DC
  Administered 2012-05-12 – 2012-05-21 (×10): 81 mg via ORAL
  Filled 2012-05-11 (×11): qty 1

## 2012-05-11 MED ORDER — IPRATROPIUM BROMIDE 0.02 % IN SOLN
0.5000 mg | Freq: Once | RESPIRATORY_TRACT | Status: AC
  Administered 2012-05-11: 0.5 mg via RESPIRATORY_TRACT
  Filled 2012-05-11: qty 2.5

## 2012-05-11 MED ORDER — INSULIN ASPART 100 UNIT/ML ~~LOC~~ SOLN
3.0000 [IU] | Freq: Three times a day (TID) | SUBCUTANEOUS | Status: DC
  Administered 2012-05-12 (×2): 3 [IU] via SUBCUTANEOUS

## 2012-05-11 MED ORDER — SODIUM CHLORIDE 0.9 % IJ SOLN: 3.0000 mL | INTRAMUSCULAR | Status: DC | PRN

## 2012-05-11 MED ORDER — METFORMIN HCL 850 MG PO TABS
850.0000 mg | ORAL_TABLET | Freq: Every day | ORAL | Status: DC
  Administered 2012-05-12: 850 mg via ORAL
  Filled 2012-05-11 (×3): qty 1

## 2012-05-11 MED ORDER — ALBUTEROL SULFATE (5 MG/ML) 0.5% IN NEBU
5.0000 mg | INHALATION_SOLUTION | Freq: Once | RESPIRATORY_TRACT | Status: AC
  Administered 2012-05-11: 5 mg via RESPIRATORY_TRACT
  Filled 2012-05-11: qty 1

## 2012-05-11 MED ORDER — INSULIN GLARGINE 100 UNIT/ML ~~LOC~~ SOLN
10.0000 [IU] | Freq: Every day | SUBCUTANEOUS | Status: DC
  Administered 2012-05-11: 10 [IU] via SUBCUTANEOUS

## 2012-05-11 MED ORDER — TRAMADOL HCL 50 MG PO TABS
50.0000 mg | ORAL_TABLET | Freq: Two times a day (BID) | ORAL | Status: DC
  Administered 2012-05-11 – 2012-05-13 (×4): 50 mg via ORAL
  Filled 2012-05-11 (×3): qty 2
  Filled 2012-05-11: qty 1
  Filled 2012-05-11: qty 2

## 2012-05-11 MED ORDER — CARVEDILOL 3.125 MG PO TABS
3.1250 mg | ORAL_TABLET | Freq: Two times a day (BID) | ORAL | Status: DC
  Administered 2012-05-12 – 2012-05-16 (×9): 3.125 mg via ORAL
  Filled 2012-05-11 (×12): qty 1

## 2012-05-11 MED ORDER — VITAMIN D3 25 MCG (1000 UNIT) PO TABS
1000.0000 [IU] | ORAL_TABLET | Freq: Every morning | ORAL | Status: DC
  Administered 2012-05-12 – 2012-05-21 (×10): 1000 [IU] via ORAL
  Filled 2012-05-11 (×11): qty 1

## 2012-05-11 NOTE — ED Notes (Signed)
desats with minimal activity ie talking on phone-states she feels congested, states she can not get rid of mucus in throat

## 2012-05-11 NOTE — ED Notes (Signed)
Attempted to call report

## 2012-05-11 NOTE — ED Notes (Signed)
Patient refused her bed sheets to be changed. Asked for me to cover them with a blanket. RN and charge nurse aware

## 2012-05-11 NOTE — ED Notes (Signed)
Patients blood sugar was 69. This was reported to the RN. Gave patient a sandwich and diet ginger ale. Family gave patient a protein bar.

## 2012-05-11 NOTE — ED Provider Notes (Signed)
History     CSN: [id number]  Arrival date & time 05/11/12  1326   First MD Initiated Contact with Patient 05/11/12 1458      Chief Complaint  Patient presents with  . Low oxygen     (Consider location/radiation/quality/duration/timing/severity/associated sxs/prior treatment) The history is provided by the patient.   patient presents with shortness of breath and fatigue for the last few days. She states she feels as if there is something in her upper chest she needs to cough out. She states she is put on 30 pounds in the last month. She states she's not been able to walk like she used to. No fevers.   nausea vomiting diarrhea. Production of sputum. She states that her sats were in the 70s yesterday. Her doctor told her to the ER. Patient states that she does not have CHF or COPD.  Past Medical History  Diagnosis Date  . Diabetes mellitus without complication   . Hypertension   . Anxiety   . Arthritis   . Obesity   . CKD (chronic kidney disease), stage III     No past surgical history on file.  No family history on file.  History  Substance Use Topics  . Smoking status: Not on file  . Smokeless tobacco: Not on file  . Alcohol Use: No    OB History    Grav Para Term Preterm Abortions TAB SAB Ect Mult Living                  Review of Systems  Constitutional: Negative for activity change and appetite change.  HENT: Negative for neck stiffness.   Eyes: Negative for pain.  Respiratory: Positive for cough and shortness of breath. Negative for chest tightness.   Cardiovascular: Positive for leg swelling. Negative for chest pain.  Gastrointestinal: Negative for nausea, vomiting, abdominal pain and diarrhea.  Genitourinary: Negative for flank pain.  Musculoskeletal: Negative for back pain.  Skin: Negative for rash.  Neurological: Negative for weakness, numbness and headaches.  Psychiatric/Behavioral: Negative for behavioral problems.    Allergies  Demerol;  Penicillins; Phenergan; Codeine; Macrobid; and Relafen  Home Medications   Current Outpatient Rx  Name  Route  Sig  Dispense  Refill  . ALPRAZOLAM 0.25 MG PO TABS   Oral   Take 0.25 mg by mouth 2 (two) times daily as needed. For anxiety         . AMLODIPINE BESYLATE 5 MG PO TABS   Oral   Take 5 mg by mouth every morning.         . ASPIRIN EC 81 MG PO TBEC   Oral   Take 81 mg by mouth every morning.         . ATORVASTATIN CALCIUM 10 MG PO TABS   Oral   Take 10 mg by mouth at bedtime.         . BUSPIRONE HCL 15 MG PO TABS   Oral   Take 15 mg by mouth 2 (two) times daily.         Marland Kitchen CALCIUM CARBONATE ANTACID 750 MG PO CHEW   Oral   Chew 1 tablet by mouth daily as needed. For heartburn and indigestion         . VITAMIN D 1000 UNITS PO TABS   Oral   Take 1,000 Units by mouth every morning.         . CYCLOBENZAPRINE HCL 10 MG PO TABS   Oral   Take 10  mg by mouth 3 (three) times daily as needed. For back pain         . DICLOFENAC SODIUM 1 % TD GEL   Topical   Apply 4 g topically 4 (four) times daily as needed. For joint pain         . DOCUSATE SODIUM 100 MG PO CAPS   Oral   Take 100 mg by mouth every evening.         Marland Kitchen FEXOFENADINE HCL 180 MG PO TABS   Oral   Take 180 mg by mouth at bedtime.         Marland Kitchen FLUTICASONE PROPIONATE 50 MCG/ACT NA SUSP   Nasal   Place 2 sprays into the nose daily.         . FUROSEMIDE 20 MG PO TABS   Oral   Take 1-2 mg by mouth every morning. Take 1 tablet by mouth daily. If a dose is missed take 2 tablets the following day         . GLIPIZIDE ER 5 MG PO TB24   Oral   Take 5 mg by mouth every morning.         . IPRATROPIUM-ALBUTEROL 0.5-2.5 (3) MG/3ML IN SOLN   Nebulization   Take 3 mLs by nebulization once. Administered at PCP office         . LISINOPRIL 20 MG PO TABS   Oral   Take 20 mg by mouth every morning.         Marland Kitchen METFORMIN HCL 850 MG PO TABS   Oral   Take 850 mg by mouth daily with  breakfast.         . PANTOPRAZOLE SODIUM 40 MG PO TBEC   Oral   Take 40 mg by mouth every morning.         Marland Kitchen POLYETHYL GLYCOL-PROPYL GLYCOL 0.4-0.3 % OP SOLN   Both Eyes   Place 2 drops into both eyes 2 (two) times daily.         . TRAMADOL HCL 50 MG PO TABS   Oral   Take 50-100 mg by mouth 2 (two) times daily. Patient may use up to 2 tablets. Typically uses 1 tablet twice daily           BP 148/80  Pulse 96  Temp 98.3 F (36.8 C) (Oral)  Resp 20  SpO2 92%  Physical Exam  Nursing note and vitals reviewed. Constitutional: She is oriented to person, place, and time. She appears well-developed and well-nourished.       Patient is obese.   HENT:  Head: Normocephalic and atraumatic.  Eyes: EOM are normal. Pupils are equal, round, and reactive to light.  Neck: Normal range of motion. Neck supple.  Cardiovascular: Normal rate, regular rhythm and normal heart sounds.   No murmur heard. Pulmonary/Chest: Effort normal. No respiratory distress. She has wheezes. She has rales.       Prolonged expirations with some wheezes and bilateral rales at bases.  Abdominal: Soft. Bowel sounds are normal. She exhibits no distension. There is no tenderness. There is no rebound and no guarding.  Musculoskeletal: Normal range of motion. She exhibits edema.       Bilateral LE pitting edema  Neurological: She is alert and oriented to person, place, and time. No cranial nerve deficit.  Skin: Skin is warm and dry.  Psychiatric: She has a normal mood and affect. Her speech is normal.    ED Course  Procedures (including critical  care time)  Labs Reviewed  BASIC METABOLIC PANEL - Abnormal; Notable for the following:    CO2 37 (*)     BUN 26 (*)     GFR calc non Af Amer 60 (*)     GFR calc Af Amer 70 (*)     All other components within normal limits  PRO B NATRIURETIC PEPTIDE - Abnormal; Notable for the following:    Pro B Natriuretic peptide (BNP) 2384.0 (*)     All other components  within normal limits  CBC WITH DIFFERENTIAL  TROPONIN I  URINE CULTURE  URINALYSIS, ROUTINE W REFLEX MICROSCOPIC   Dg Chest 2 View  126-Oct-202013  *RADIOLOGY REPORT*  Clinical Data: Shortness of breath  CHEST - 2 VIEW  Comparison: None.  Findings: Cardiomegaly.  Increased interstitial markings, possibly reflecting interstitial edema or less likely atypical infection. Mild left lower lobe opacity, likely atelectasis.  No pleural effusion or pneumothorax.  Degenerative changes of the visualized thoracolumbar spine.  IMPRESSION: Cardiomegaly with increased interstitial markings, possibly reflecting interstitial edema, less likely atypical infection.   Original Report Authenticated By: Charline Bills, M.D.      1. CHF (congestive heart failure)   2. Hypoxia      Date: 126-Oct-202013  Rate: 85  Rhythm: normal sinus rhythm  QRS Axis: normal  Intervals: PR prolonged  ST/T Wave abnormalities: normal  Conduction Disutrbances:none  Narrative Interpretation:   Old EKG Reviewed: unchanged    MDM  Patient with shortness of breath. Wheezing and rales. Xray shows possible CHF and BNP is elevated. Has peripheral edema. Requires O2. Will treat with lasix and admit. Pneumonia felt less likely. Admitted to medicine.         Juliet Rude. Rubin Payor, MD 05/11/12 1740

## 2012-05-11 NOTE — ED Notes (Signed)
Attempted to call report x1 to floor. RN unavailable. Will reattempt in 10 min.

## 2012-05-11 NOTE — H&P (Signed)
[facility] History and Physical  Alenna Russell ZOX:[id number] DOB: 1931/06/17 DOA: 110/07/2011  Referring physician: Dr. Rubin Payor PCP: No primary provider on file.  Specialists: none  Chief Complaint: Cough  HPI: Makayla Rodriguez is a 78 y.o. female  Past medical history of diabetes2, hypertension and morbid obesity that comes in for cough and generalized weakness. She relates this cough has been bothering her for months. He has progressively gotten worst over the past few months. She also repeat lites paroxysmal nocturnal dyspnea, significant fatigue and tiredness with exertion, for the last 2 months. She also relates she has gained about 30 pounds. She denies any chest pain in the last 3 months, no palpitations, fever chills, diarrhea or vomiting.  Review of Systems: The patient denies anorexia, fever, weight loss,, vision loss, decreased hearing, hoarseness, chest pain, syncope, balance deficits, hemoptysis, abdominal pain, melena, hematochezia, severe indigestion/heartburn, hematuria, incontinence, genital sores, muscle weakness, suspicious skin lesions, transient blindness, difficulty walking, depression, unusual weight change, abnormal bleeding, enlarged lymph nodes, angioedema, and breast masses.    Past Medical History  Diagnosis Date  . Diabetes mellitus without complication   . Hypertension   . Anxiety   . Arthritis   . Obesity   . CKD (chronic kidney disease), stage III    History reviewed. No pertinent past surgical history. Social History:  reports that she has quit smoking. Her smoking use included Cigarettes. She has a 20 pack-year smoking history. She does not have any smokeless tobacco history on file. She reports that she does not drink alcohol or use illicit drugs. Was at home by herself can perform all her ADLs  Allergies  Allergen Reactions  . Demerol (Meperidine) Anaphylaxis    Reaction: Hypoventilation after surgery  . Penicillins Anaphylaxis  .  Phenergan (Promethazine Hcl) Anaphylaxis    Reaction: hypoventilation  . Codeine Nausea And Vomiting  . Macrobid (Nitrofurantoin) Nausea And Vomiting  . Relafen (Nabumetone) Itching and Swelling    Family History  Problem Relation Age of Onset  . Heart failure Mother   . Gallbladder disease Father     Prior to Admission medications   Medication Sig Start Date End Date Taking? Authorizing Provider  ALPRAZolam (XANAX) 0.25 MG tablet Take 0.25 mg by mouth 2 (two) times daily as needed. For anxiety   Yes Historical Provider, MD  amLODipine (NORVASC) 5 MG tablet Take 5 mg by mouth every morning.   Yes Historical Provider, MD  aspirin EC 81 MG tablet Take 81 mg by mouth every morning.   Yes Historical Provider, MD  atorvastatin (LIPITOR) 10 MG tablet Take 10 mg by mouth at bedtime.   Yes Historical Provider, MD  busPIRone (BUSPAR) 15 MG tablet Take 15 mg by mouth 2 (two) times daily.   Yes Historical Provider, MD  calcium carbonate (TUMS EX) 750 MG chewable tablet Chew 1 tablet by mouth daily as needed. For heartburn and indigestion   Yes Historical Provider, MD  cholecalciferol (VITAMIN D) 1000 UNITS tablet Take 1,000 Units by mouth every morning.   Yes Historical Provider, MD  cyclobenzaprine (FLEXERIL) 10 MG tablet Take 10 mg by mouth 3 (three) times daily as needed. For back pain   Yes Historical Provider, MD  diclofenac sodium (VOLTAREN) 1 % GEL Apply 4 g topically 4 (four) times daily as needed. For joint pain   Yes Historical Provider, MD  docusate sodium (COLACE) 100 MG capsule Take 100 mg by mouth every evening.   Yes Historical Provider, MD  fexofenadine (ALLEGRA) 180 MG  tablet Take 180 mg by mouth at bedtime.   Yes Historical Provider, MD  fluticasone (FLONASE) 50 MCG/ACT nasal spray Place 2 sprays into the nose daily.   Yes Historical Provider, MD  furosemide (LASIX) 20 MG tablet Take 1-2 mg by mouth every morning. Take 1 tablet by mouth daily. If a dose is missed take 2 tablets the  following day   Yes Historical Provider, MD  glipiZIDE (GLUCOTROL XL) 5 MG 24 hr tablet Take 5 mg by mouth every morning.   Yes Historical Provider, MD  ipratropium-albuterol (DUONEB) 0.5-2.5 (3) MG/3ML SOLN Take 3 mLs by nebulization once. Administered at PCP office   Yes Historical Provider, MD  lisinopril (PRINIVIL,ZESTRIL) 20 MG tablet Take 20 mg by mouth every morning.   Yes Historical Provider, MD  metFORMIN (GLUCOPHAGE) 850 MG tablet Take 850 mg by mouth daily with breakfast.   Yes Historical Provider, MD  pantoprazole (PROTONIX) 40 MG tablet Take 40 mg by mouth every morning.   Yes Historical Provider, MD  Polyethyl Glycol-Propyl Glycol (SYSTANE PRESERVATIVE FREE) 0.4-0.3 % SOLN Place 2 drops into both eyes 2 (two) times daily.   Yes Historical Provider, MD  traMADol (ULTRAM) 50 MG tablet Take 50-100 mg by mouth 2 (two) times daily. Patient may use up to 2 tablets. Typically uses 1 tablet twice daily   Yes Historical Provider, MD   Physical Exam: Filed Vitals:   05/11/12 1320 05/11/12 1326 05/11/12 1550 05/11/12 1627  BP:  146/72  148/80  Pulse:  89  96  Temp:  98.5 F (36.9 C)  98.3 F (36.8 C)  TempSrc:  Oral  Oral  Resp:  23  20  SpO2: 94% 95% 87% 92%     General:  Awake alert and oriented x3 morbidly obese female  Eyes: Anicteric  ENT: Moist mucous membrane  Neck: Positive JVD and positive hepatojugular reflex  Cardiovascular: Regular rate and rhythm with positive S1 and S2 no murmurs rubs gallops appreciated  Respiratory:  Good air movement and crackles halfway up her lungs  Abdomen: Positive bowel sounds nontender nondistended and soft  Skin: No rashes or ulceration  Musculoskeletal: Intact  Psychiatric: Appropriate  Neurologic: Awake alert and oriented x4 coherent for language and nonfocal  Labs on Admission:  Basic Metabolic Panel:  Lab 05/11/12 1610  NA 140  K 4.9  CL 98  CO2 37*  GLUCOSE 81  BUN 26*  CREATININE 0.89  CALCIUM 9.6  MG --  PHOS  --   Liver Function Tests: No results found for this basename: AST:5,ALT:5,ALKPHOS:5,BILITOT:5,PROT:5,ALBUMIN:5 in the last 168 hours No results found for this basename: LIPASE:5,AMYLASE:5 in the last 168 hours No results found for this basename: AMMONIA:5 in the last 168 hours CBC:  Lab 05/11/12 1603  WBC 8.7  NEUTROABS 5.3  HGB 12.4  HCT 39.6  MCV 93.6  PLT 239   Cardiac Enzymes:  Lab 05/11/12 1603  CKTOTAL --  CKMB --  CKMBINDEX --  TROPONINI <0.30    BNP (last 3 results)  Basename 05/11/12 1603  PROBNP 2384.0*   CBG: No results found for this basename: GLUCAP:5 in the last 168 hours  Radiological Exams on Admission: Dg Chest 2 View  107-Nov-202013  *RADIOLOGY REPORT*  Clinical Data: Shortness of breath  CHEST - 2 VIEW  Comparison: None.  Findings: Cardiomegaly.  Increased interstitial markings, possibly reflecting interstitial edema or less likely atypical infection. Mild left lower lobe opacity, likely atelectasis.  No pleural effusion or pneumothorax.  Degenerative changes of  the visualized thoracolumbar spine.  IMPRESSION: Cardiomegaly with increased interstitial markings, possibly reflecting interstitial edema, less likely atypical infection.   Original Report Authenticated By: Charline Bills, M.D.     EKG: First-degree AV block, normal sinus rhythm with a normal QRS and normal P waves.  Assessment/Plan Principal Problem: Acute respiratory failure/ Acute diastolic heart failure, NYHA class 2 - She has been having paroxysmal nocturnal dyspnea, with a 30 pound weight gain and tiredness and dyspnea on exertion for the past 3 weeks, her chest x-ray showed interstitial edema, with elevated BNP greater than 1500, positive JVD, positive hepatojugular reflux and crackles in her lungs on physical examination next a concern for acute decompensated heart failure.  - She has never been tortuous heart failure. Her previous echo here in the hospital in 2011 showed an ejection  fraction of 65%. I am thinking she does have diastolic heart failure. She is an elderly female obese probably with obstructive sleep apnea.  -I will go ahead and admitted to the telemetry unit, check a TSH, cycle her cardiac enzymes x3, restrict her diet start her on Lasix high dose, monitor strict I.'s and does follow her potassium and electrolyte and replete as needed. Also get a 2-D echo to try to confirm this diagnoses.  - I will try to maximize her the lisinopril, DC her Norvasc. We'll start her on low-dose beta blocker. She does have a first degree AV block with a normal P-wave and QRS. We'll check a EKG in the morning.  - She will need a sleep study as an outpatient to confirm the diagnosis of obstructive sleep apnea.  Diabetes mellitus: - Go ahead and hold her metformin and start her on Lantus plus sliding scale. Continue her glipizide home dose.  OA (osteoarthritis) of ankle  Hypertension - DC her Norvasc, increase her lisinopril, check a basic metabolic panel in the morning. Start a low-dose beta blocker. And continue to titrate medications as tolerated.  Morbid obesity - Counsleing    Code Status: full Family Communication: sister Disposition Plan: home 3-4 days  Time spent: 60  Marinda Elk [facility] Pager [phone]  If 7PM-7AM, please contact night-coverage [email or web address] Password [facility] 12020-10-1211, 6:09 PM

## 2012-05-11 NOTE — ED Notes (Signed)
ZOX:[id number]<AV> Expected date:<BR> Expected time:<BR> Means of arrival:<BR> Comments:<BR> Low sats, stable

## 2012-05-11 NOTE — ED Notes (Signed)
Per EMS pt came from Doctor's office where they were called bc pt SP O2 level was low.  EMS arrived pt O2 level 94 on 2L via [place].  Pt stats that last night her sats got down in 60's and was lethargic.  Pt has bilat fluid on legs, lungs clear and no other c/o at this time.

## 2012-05-12 DIAGNOSIS — J9600 Acute respiratory failure, unspecified whether with hypoxia or hypercapnia: Secondary | ICD-10-CM | POA: Diagnosis not present

## 2012-05-12 DIAGNOSIS — I509 Heart failure, unspecified: Secondary | ICD-10-CM | POA: Diagnosis not present

## 2012-05-12 DIAGNOSIS — E119 Type 2 diabetes mellitus without complications: Secondary | ICD-10-CM | POA: Diagnosis not present

## 2012-05-12 DIAGNOSIS — I5031 Acute diastolic (congestive) heart failure: Secondary | ICD-10-CM | POA: Diagnosis not present

## 2012-05-12 LAB — BASIC METABOLIC PANEL
Calcium: 9 mg/dL (ref 8.4–10.5)
GFR calc non Af Amer: 55 mL/min — ABNORMAL LOW (ref >90)
Glucose, Bld: 61 mg/dL — ABNORMAL LOW (ref 70–99)
Sodium: 140 mEq/L (ref 135–145)

## 2012-05-12 LAB — GLUCOSE, CAPILLARY
Glucose-Capillary: 67 mg/dL — ABNORMAL LOW (ref 70–99)
Glucose-Capillary: 78 mg/dL (ref 70–99)
Glucose-Capillary: 98 mg/dL (ref 70–99)

## 2012-05-12 LAB — TROPONIN I
Troponin I: <0.3 ng/mL (ref <0.30)
Troponin I: <0.3 ng/mL (ref <0.30)

## 2012-05-12 LAB — TSH: TSH: 4.125 u[IU]/mL (ref 0.350–4.500)

## 2012-05-12 LAB — HEMOGLOBIN A1C
Hgb A1c MFr Bld: 7.7 % — ABNORMAL HIGH (ref <5.7)
Mean Plasma Glucose: 174 mg/dL — ABNORMAL HIGH (ref <117)

## 2012-05-12 MED ORDER — DICLOFENAC SODIUM 1 % TD GEL
2.0000 g | Freq: Four times a day (QID) | TRANSDERMAL | Status: DC | PRN
  Administered 2012-05-12 – 2012-05-19 (×2): 2 g via TOPICAL
  Filled 2012-05-12: qty 100

## 2012-05-12 NOTE — Progress Notes (Signed)
Attempted to get patient up to chair.  Patient was refusing.  Educated patient about the disadvantages of remaining in the bed throughout the day and regarding importance of physical activity.  Patient still refused.  Attempted to get patient to sit on side of bed.  Patient had some difficulty moving needing assistance from both RN and nurse tech and then refused.  Will notify MD for need of possible PT consult.  Will continue to monitor.

## 2012-05-12 NOTE — Progress Notes (Signed)
Utilization review completed.  

## 2012-05-12 NOTE — Progress Notes (Signed)
*  PRELIMINARY RESULTS* Echocardiogram 2D Echocardiogram has been performed.  Makayla Rodriguez 05/12/2012, 9:37 AM

## 2012-05-12 NOTE — Progress Notes (Signed)
[facility] PROGRESS NOTE  Makayla Rodriguez ZOX:[id number] DOB: March 28, 1932 DOA: 12020/08/211 PCP: No primary provider on file.  Assessment/Plan: 1-Acute respiratory failure/ Acute diastolic heart failure, NYHA class 2 Relates improvement SOB.  Continue with lasix 40 mg IV Q 8 hours., daily weight, strict I and O. Urine out put 5 L 12-13. Weight 139 Kg on admission.  Started on Coreg. ECHO pending.  Needs diet education.   2-Diabetes:  Hold lantus and glipizide due to hypoglycemia. Hold metformin while inpatient.  SSI. 3-HTN: Hold Lisinopril due to mild renal insufficiency. Agree with stopping Norvasc.  4-Morbid obesity  - Counsleing 5-Cough/Congestion: will need to repeat Chest X ray at some point.  6-Renal Insufficiency/Mild uremia: Monitor on lasix. Hold lisinopril.    Code Status: Full Family Communication: Care discussed with patient.  Disposition Plan: Remain inpatient for diuresis.    Consultants:  None  Procedures:  None  Antibiotics:  None  HPI/Subjective: Feels SOB better. Congestion better. Feels phlegm chest better since admission.  Objective: Filed Vitals:   05/11/12 1550 05/11/12 1627 05/11/12 2107 05/12/12 0500  BP:  148/80 119/57 128/68  Pulse:  96 88 80  Temp:  98.3 F (36.8 C) 98.3 F (36.8 C) 97.5 F (36.4 C)  TempSrc:  Oral Oral Oral  Resp:  20 20 20   Height:   5\' 3"  (1.6 m)   Weight:   139.1 kg (306 lb 10.6 oz)   SpO2: 87% 92% 92% 93%    Intake/Output Summary (Last 24 hours) at 05/12/12 0957 Last data filed at 05/12/12 0949  Gross per 24 hour  Intake    380 ml  Output   6550 ml  Net  -6170 ml   Filed Weights   05/11/12 2107  Weight: 139.1 kg (306 lb 10.6 oz)    Exam:   General:  Morbid obese, No distress.  Cardiovascular: S 1, S 2 RRR  Respiratory: Crackles base, sporadic wheezes.   Abdomen: Bs present, soft, Nt, Nd.  Extremities; plus 2 edema.   Data Reviewed: Basic Metabolic Panel:  Lab 05/12/12 8119  05/11/12 2230 05/11/12 1603  NA 140 137 140  K 4.5 4.3 4.9  CL 95* 95* 98  CO2 39* 37* 37*  GLUCOSE 61* 155* 81  BUN 27* 28* 26*  CREATININE 0.96 1.04 0.89  CALCIUM 9.0 8.9 9.6  MG -- -- --  PHOS -- -- --   Liver Function Tests:  Lab 05/11/12 2230  AST 16  ALT 12  ALKPHOS 58  BILITOT 0.2*  PROT 6.3  ALBUMIN 2.9*   No results found for this basename: LIPASE:5,AMYLASE:5 in the last 168 hours No results found for this basename: AMMONIA:5 in the last 168 hours CBC:  Lab 05/11/12 2230 05/11/12 1603  WBC 7.8 8.7  NEUTROABS -- 5.3  HGB 11.5* 12.4  HCT 37.2 39.6  MCV 94.2 93.6  PLT 235 239   Cardiac Enzymes:  Lab 05/12/12 0500 05/11/12 2230 05/11/12 1603  CKTOTAL -- -- --  CKMB -- -- --  CKMBINDEX -- -- --  TROPONINI <0.30 <0.30 <0.30   BNP (last 3 results)  Basename 05/11/12 1603  PROBNP 2384.0*   CBG:  Lab 05/12/12 0838 05/12/12 0750 05/11/12 2113 05/11/12 1826  GLUCAP 107* 67* 171* 69*    No results found for this or any previous visit (from the past 240 hour(s)).   Studies: Dg Chest 2 View  12020/08/211  *RADIOLOGY REPORT*  Clinical Data: Shortness of breath  CHEST - 2 VIEW  Comparison:  None.  Findings: Cardiomegaly.  Increased interstitial markings, possibly reflecting interstitial edema or less likely atypical infection. Mild left lower lobe opacity, likely atelectasis.  No pleural effusion or pneumothorax.  Degenerative changes of the visualized thoracolumbar spine.  IMPRESSION: Cardiomegaly with increased interstitial markings, possibly reflecting interstitial edema, less likely atypical infection.   Original Report Authenticated By: Charline Bills, M.D.     Scheduled Meds:   . aspirin EC  81 mg Oral q morning - 10a  . atorvastatin  10 mg Oral QHS  . busPIRone  15 mg Oral BID  . carvedilol  3.125 mg Oral BID WC  . cholecalciferol  1,000 Units Oral q morning - 10a  . docusate sodium  100 mg Oral QPM  . fluticasone  2 spray Each Nare Daily  .  furosemide  40 mg Intravenous Q8H  . heparin  5,000 Units Subcutaneous Q8H  . insulin aspart  0-15 Units Subcutaneous TID WC  . insulin aspart  0-5 Units Subcutaneous QHS  . insulin aspart  3 Units Subcutaneous TID WC  . pantoprazole  40 mg Oral q morning - 10a  . polyvinyl alcohol  2 drop Both Eyes BID  . sodium chloride  3 mL Intravenous Q12H  . traMADol  50-100 mg Oral BID   Continuous Infusions:   Principal Problem:  *Acute respiratory failure Active Problems:  Acute diastolic heart failure, NYHA class 2  Diabetes mellitus  OA (osteoarthritis) of ankle  Hypertension  Morbid obesity    Time spent: 35 minutes    Makayla Rodriguez  [facility] Pager [phone]. If 8PM-8AM, please contact night-coverage at [email or web address], password [facility] 05/12/2012, 9:57 AM  LOS: 1 day

## 2012-05-12 NOTE — Progress Notes (Signed)
Hypoglycemic Event  CBG:67  Treatment: 15 GM carbohydrate snack  Symptoms: None  Follow-up CBG: [id number] CBG Result:107  Possible Reasons for Event: Unknown  Comments/MD notified:    Izea Livolsi A  Remember to initiate Hypoglycemia Order Set & complete

## 2012-05-13 DIAGNOSIS — I5031 Acute diastolic (congestive) heart failure: Secondary | ICD-10-CM | POA: Diagnosis not present

## 2012-05-13 DIAGNOSIS — I509 Heart failure, unspecified: Secondary | ICD-10-CM | POA: Diagnosis not present

## 2012-05-13 DIAGNOSIS — E119 Type 2 diabetes mellitus without complications: Secondary | ICD-10-CM | POA: Diagnosis not present

## 2012-05-13 DIAGNOSIS — J9600 Acute respiratory failure, unspecified whether with hypoxia or hypercapnia: Secondary | ICD-10-CM | POA: Diagnosis not present

## 2012-05-13 LAB — BLOOD GAS, ARTERIAL
Acid-Base Excess: 16.3 mmol/L — ABNORMAL HIGH (ref 0.0–2.0)
Bicarbonate: 47.4 mEq/L — ABNORMAL HIGH (ref 20.0–24.0)
Delivery systems: POSITIVE
Drawn by: 295031
Expiratory PAP: 5
O2 Content: 3 L/min
O2 Saturation: 90.9 %
Patient temperature: 98.6
Patient temperature: 99.4
TCO2: 42 mmol/L (ref 0–100)
TCO2: 42.6 mmol/L (ref 0–100)
pCO2 arterial: 88.5 mmHg (ref 35.0–45.0)
pCO2 arterial: 77.9 mmHg (ref 35.0–45.0)
pH, Arterial: 7.404 (ref 7.350–7.450)
pO2, Arterial: 64.1 mmHg — ABNORMAL LOW (ref 80.0–100.0)

## 2012-05-13 LAB — MRSA PCR SCREENING: MRSA by PCR: NEGATIVE

## 2012-05-13 LAB — GLUCOSE, CAPILLARY
Glucose-Capillary: 95 mg/dL (ref 70–99)
Glucose-Capillary: 141 mg/dL — ABNORMAL HIGH (ref 70–99)

## 2012-05-13 LAB — BASIC METABOLIC PANEL
CO2: 43 mEq/L (ref 19–32)
Calcium: 9 mg/dL (ref 8.4–10.5)
GFR calc non Af Amer: 55 mL/min — ABNORMAL LOW (ref >90)
Glucose, Bld: 131 mg/dL — ABNORMAL HIGH (ref 70–99)
Potassium: 4 mEq/L (ref 3.5–5.1)
Sodium: 139 mEq/L (ref 135–145)

## 2012-05-13 LAB — URINE CULTURE: Culture: NO GROWTH

## 2012-05-13 LAB — CBC
Hemoglobin: 12.9 g/dL (ref 12.0–15.0)
MCH: 29.1 pg (ref 26.0–34.0)
Platelets: 192 10*3/uL (ref 150–400)
RBC: 4.44 MIL/uL (ref 3.87–5.11)
WBC: 8.5 10*3/uL (ref 4.0–10.5)

## 2012-05-13 MED ORDER — FUROSEMIDE 10 MG/ML IJ SOLN
40.0000 mg | Freq: Two times a day (BID) | INTRAMUSCULAR | Status: DC
  Administered 2012-05-13 – 2012-05-14 (×2): 40 mg via INTRAVENOUS
  Filled 2012-05-13 (×4): qty 4

## 2012-05-13 NOTE — Progress Notes (Signed)
No am lab results  

## 2012-05-13 NOTE — Progress Notes (Signed)
Patients speech is slurred.  She states she feels like her hands are shaking.  Observed that patient was unable to hold on to telephone to place a call.

## 2012-05-13 NOTE — Plan of Care (Signed)
Problem: Phase I Progression Outcomes Goal: OOB as tolerated unless otherwise ordered Outcome: Not Progressing Refusing ambulation / movement to chair

## 2012-05-13 NOTE — Progress Notes (Signed)
[facility] PROGRESS NOTE  Erminia Mcnew HYQ:[id number] DOB: April 06, 1932 DOA: 1May 31, 202013 PCP: No primary provider on file.  Assessment/Plan: 1-Acute respiratory failure/ Acute diastolic heart failure, NYHA class 2  Patient with slow respond time. ABG show increase PCO2 at 88, PH 7.3. She will  benefit BIPAP.  I will transfer her to step Down. Start BIPAP. Will ask for CCM consult.  Relates improvement SOB.  I will decrease lasix to 40 mg BID due to increase PCO2. . Weight 139 Kg on admission. Today at 131.  Started on Coreg.  ECHO pending.  Needs diet education.   2-Diabetes:  Hold lantus and glipizide due to hypoglycemia.  Hold metformin while inpatient.  SSI.  3-HTN: Hold Lisinopril due to mild renal insufficiency. Agree with stopping Norvasc.  4-Morbid obesity  - Counsleing  5-Cough/Congestion: will need to repeat Chest X ray at some point.  6-Renal Insufficiency/Mild uremia: Monitor on lasix. Hold lisinopril.   Code Status: Full  Family Communication: Care discussed with patient.  Disposition Plan: Remain inpatient for diuresis.    HPI/Subjective: Patient relates improvement SOB. Slow respond time  She relates some tremors hands and bilateral arms weakness.   Objective: Filed Vitals:   05/12/12 1411 05/12/12 2102 05/13/12 0558 05/13/12 0640  BP: 132/74 122/58 141/65   Pulse: 87 83 92   Temp: 98.5 F (36.9 C) 98.6 F (37 C) 98.5 F (36.9 C)   TempSrc: Oral Oral Oral   Resp: 20 20 18    Height:      Weight:    131.634 kg (290 lb 3.2 oz)  SpO2: 95% 94% 95%     Intake/Output Summary (Last 24 hours) at 05/13/12 1510 Last data filed at 05/13/12 1223  Gross per 24 hour  Intake    385 ml  Output   3625 ml  Net  -3240 ml   Filed Weights   05/11/12 2107 05/13/12 0640  Weight: 139.1 kg (306 lb 10.6 oz) 131.634 kg (290 lb 3.2 oz)    Exam:   General:  No distress, awake but with decrease respond time.   Cardiovascular: S 1, S 2 RRR  Respiratory:  Crackles bases.   Abdomen: Bs presents, soft, no rigidity.  Extremities; plus 2 edema.     Data Reviewed: Basic Metabolic Panel:  Lab 05/13/12 9528 05/12/12 0500 05/11/12 2230 05/11/12 1603  NA 139 140 137 140  K 4.0 4.5 4.3 4.9  CL 90* 95* 95* 98  CO2 43* 39* 37* 37*  GLUCOSE 131* 61* 155* 81  BUN 24* 27* 28* 26*  CREATININE 0.96 0.96 1.04 0.89  CALCIUM 9.0 9.0 8.9 9.6  MG -- -- -- --  PHOS -- -- -- --   Liver Function Tests:  Lab 05/11/12 2230  AST 16  ALT 12  ALKPHOS 58  BILITOT 0.2*  PROT 6.3  ALBUMIN 2.9*   No results found for this basename: LIPASE:5,AMYLASE:5 in the last 168 hours No results found for this basename: AMMONIA:5 in the last 168 hours CBC:  Lab 05/13/12 0906 05/11/12 2230 05/11/12 1603  WBC 8.5 7.8 8.7  NEUTROABS -- -- 5.3  HGB 12.9 11.5* 12.4  HCT 42.3 37.2 39.6  MCV 95.3 94.2 93.6  PLT 192 235 239   Cardiac Enzymes:  Lab 05/12/12 1019 05/12/12 0500 05/11/12 2230 05/11/12 1603  CKTOTAL -- -- -- --  CKMB -- -- -- --  CKMBINDEX -- -- -- --  TROPONINI <0.30 <0.30 <0.30 <0.30   BNP (last 3 results)  Basename 05/11/12  1603  PROBNP 2384.0*   CBG:  Lab 05/13/12 1211 05/13/12 0705 05/12/12 2059 05/12/12 1727 05/12/12 1209  GLUCAP 141* 95 105* 98 78    Recent Results (from the past 240 hour(s))  URINE CULTURE     Status: Normal   Collection Time   05/11/12  6:29 PM      Component Value Range Status Comment   Specimen Description URINE, CLEAN CATCH   Final    Special Requests NONE   Final    Culture  Setup Time 05/12/2012 03:24   Final    Colony Count NO GROWTH   Final    Culture NO GROWTH   Final    Report Status 05/13/2012 FINAL   Final      Studies: Dg Chest 2 View  103-13-202013  *RADIOLOGY REPORT*  Clinical Data: Shortness of breath  CHEST - 2 VIEW  Comparison: None.  Findings: Cardiomegaly.  Increased interstitial markings, possibly reflecting interstitial edema or less likely atypical infection. Mild left lower lobe  opacity, likely atelectasis.  No pleural effusion or pneumothorax.  Degenerative changes of the visualized thoracolumbar spine.  IMPRESSION: Cardiomegaly with increased interstitial markings, possibly reflecting interstitial edema, less likely atypical infection.   Original Report Authenticated By: Charline Bills, M.D.     Scheduled Meds:   . aspirin EC  81 mg Oral q morning - 10a  . atorvastatin  10 mg Oral QHS  . busPIRone  15 mg Oral BID  . carvedilol  3.125 mg Oral BID WC  . cholecalciferol  1,000 Units Oral q morning - 10a  . docusate sodium  100 mg Oral QPM  . fluticasone  2 spray Each Nare Daily  . furosemide  40 mg Intravenous Q12H  . heparin  5,000 Units Subcutaneous Q8H  . insulin aspart  0-15 Units Subcutaneous TID WC  . insulin aspart  0-5 Units Subcutaneous QHS  . insulin aspart  3 Units Subcutaneous TID WC  . pantoprazole  40 mg Oral q morning - 10a  . polyvinyl alcohol  2 drop Both Eyes BID  . sodium chloride  3 mL Intravenous Q12H  . traMADol  50-100 mg Oral BID   Continuous Infusions:   Principal Problem:  *Acute respiratory failure Active Problems:  Acute diastolic heart failure, NYHA class 2  Diabetes mellitus  OA (osteoarthritis) of ankle  Hypertension  Morbid obesity    Time spent: 35 minutes.     Keshan Reha  [facility] Pager [phone]. If 8PM-8AM, please contact night-coverage at [email or web address], password [facility] 05/13/2012, 3:10 PM  LOS: 2 days

## 2012-05-13 NOTE — Progress Notes (Signed)
Elink MD Vassie Loll notified of pt's repeat blood gas at 2015. Dr. Vassie Loll reviewed blood gas and said that no BiPAP was required for pt. I checked with pt and pt's care giver to see if pt wears a CPAP at home. Pt and caregiver reported that she does not wear CPAP at home. Will continue to monitor.  Langley Gauss, RN

## 2012-05-13 NOTE — Progress Notes (Signed)
Per Md, MRI of brain cancelled.  Report given to step down nurse

## 2012-05-13 NOTE — Plan of Care (Signed)
Problem: Phase I Progression Outcomes Goal: Hemodynamically stable Outcome: Not Met (add Reason) CO2 levels

## 2012-05-13 NOTE — Progress Notes (Addendum)
CRITICAL VALUE ALERT  Critical value received:  CO2  Date of notification:  05/13/12  Time of notification:  1015  Critical value read back:yes  Nurse who received alert:  Maple Hudson  MD notified (1st page):  Regalado, B  Time of first page:  1020  MD notified (2nd page):  Time of second page:  Responding MD:  Lonia Farber  Time MD responded:  1021

## 2012-05-13 NOTE — Progress Notes (Signed)
Pt's foley catheter leaking this a.m. Bed linens were saturated.  Pulled back 9 cc fluid in balloon.  Pushed the 9cc back into foley along with additional 3 cc of NS.  Pt still has significant output in foley bag.  Will continue to monitor pt's urine  output.

## 2012-05-13 NOTE — Plan of Care (Signed)
Problem: Food- and Nutrition-Related Knowledge Deficit (NB-1.1) Goal: Nutrition education Formal process to instruct or train a patient/client in a skill or to impart knowledge to help patients/clients voluntarily manage or modify food choices and eating behavior to maintain or improve health.  Outcome: Completed/Met Date Met:  05/13/12 Patient admitted with acute respiratory failure/acute heart failure with recent weight gain of 30 pounds (likely this is partially fluid retention). Consult received for diet education. Patient reports following a low sodium diet at home. Question compliance with medications. Patient educated on a low sodium diet and food label reading. We also discussed eating a general healthy diet with limited portion size. Teach back method used. Educational handouts provided.

## 2012-05-13 NOTE — Progress Notes (Signed)
Pt very lethargic upon assessment and med administration. Pt easily aroused but also nods off very easily.  Pt had to be encouraged repeatedly to swallow pills and drink her water.  Speech was slightly slurred and pt mumbling. Pupils round and reactive to light.  Grips strong and present bilaterally. Pt able to dorsiflex feet and wiggle toes when prompted.  Will continue to monitor pt through the night.

## 2012-05-13 NOTE — Plan of Care (Signed)
Problem: Consults Goal: Diabetes Guidelines if Diabetic/Glucose > 140 If diabetic or lab glucose is > 140 mg/dl - Initiate Diabetes/Hyperglycemia Guidelines & Document Interventions  Outcome: Completed/Met Date Met:  05/13/12 Discussed reason patients PO diabetic medication is on hold

## 2012-05-14 ENCOUNTER — Inpatient Hospital Stay (HOSPITAL_COMMUNITY): Payer: Medicare Other

## 2012-05-14 DIAGNOSIS — J9819 Other pulmonary collapse: Secondary | ICD-10-CM | POA: Diagnosis not present

## 2012-05-14 DIAGNOSIS — E119 Type 2 diabetes mellitus without complications: Secondary | ICD-10-CM | POA: Diagnosis not present

## 2012-05-14 DIAGNOSIS — J9600 Acute respiratory failure, unspecified whether with hypoxia or hypercapnia: Secondary | ICD-10-CM | POA: Diagnosis not present

## 2012-05-14 DIAGNOSIS — R0609 Other forms of dyspnea: Secondary | ICD-10-CM | POA: Diagnosis not present

## 2012-05-14 DIAGNOSIS — I10 Essential (primary) hypertension: Secondary | ICD-10-CM | POA: Diagnosis not present

## 2012-05-14 DIAGNOSIS — I5031 Acute diastolic (congestive) heart failure: Secondary | ICD-10-CM | POA: Diagnosis not present

## 2012-05-14 DIAGNOSIS — R0902 Hypoxemia: Secondary | ICD-10-CM

## 2012-05-14 LAB — CBC
Hemoglobin: 13 g/dL (ref 12.0–15.0)
Platelets: 241 10*3/uL (ref 150–400)
RBC: 4.54 MIL/uL (ref 3.87–5.11)
WBC: 8.4 10*3/uL (ref 4.0–10.5)

## 2012-05-14 LAB — BASIC METABOLIC PANEL
BUN: 24 mg/dL — ABNORMAL HIGH (ref 6–23)
CO2: 45 mEq/L (ref 19–32)
Calcium: 9.1 mg/dL (ref 8.4–10.5)
Creatinine, Ser: 0.92 mg/dL (ref 0.50–1.10)
GFR calc non Af Amer: 58 mL/min — ABNORMAL LOW (ref >90)
Glucose, Bld: 107 mg/dL — ABNORMAL HIGH (ref 70–99)

## 2012-05-14 LAB — GLUCOSE, CAPILLARY
Glucose-Capillary: 147 mg/dL — ABNORMAL HIGH (ref 70–99)
Glucose-Capillary: 119 mg/dL — ABNORMAL HIGH (ref 70–99)

## 2012-05-14 MED ORDER — FUROSEMIDE 10 MG/ML IJ SOLN
40.0000 mg | Freq: Three times a day (TID) | INTRAMUSCULAR | Status: DC
  Administered 2012-05-14 – 2012-05-15 (×4): 40 mg via INTRAVENOUS
  Filled 2012-05-14 (×4): qty 4

## 2012-05-14 MED ORDER — ALBUTEROL SULFATE (5 MG/ML) 0.5% IN NEBU
2.5000 mg | INHALATION_SOLUTION | Freq: Four times a day (QID) | RESPIRATORY_TRACT | Status: DC
  Administered 2012-05-14 – 2012-05-21 (×26): 2.5 mg via RESPIRATORY_TRACT
  Filled 2012-05-14 (×27): qty 0.5

## 2012-05-14 MED ORDER — IOHEXOL 350 MG/ML SOLN
100.0000 mL | Freq: Once | INTRAVENOUS | Status: AC | PRN
  Administered 2012-05-14: 100 mL via INTRAVENOUS

## 2012-05-14 MED ORDER — BIOTENE DRY MOUTH MT LIQD
15.0000 mL | Freq: Two times a day (BID) | OROMUCOSAL | Status: DC
  Administered 2012-05-14 – 2012-05-21 (×14): 15 mL via OROMUCOSAL

## 2012-05-14 MED ORDER — IPRATROPIUM BROMIDE 0.02 % IN SOLN
0.5000 mg | Freq: Four times a day (QID) | RESPIRATORY_TRACT | Status: DC
  Administered 2012-05-14 – 2012-05-21 (×26): 0.5 mg via RESPIRATORY_TRACT
  Filled 2012-05-14 (×27): qty 2.5

## 2012-05-14 NOTE — Evaluation (Signed)
Physical Therapy Evaluation Patient Details Name: Makayla Rodriguez MRN: [id number] DOB: [birth year] Today's Date: 05/14/2012 Time: 4098-1191 PT Time Calculation (min): 19 min  PT Assessment / Plan / Recommendation Clinical Impression  Pt. was admitted 12/13 for acute respiratory failure, cough, increased weight gain. Pt. was lethargic, arroused when stimulated but not able to provide informationand no family present. Pt. will continue while in acute care.    PT Assessment  Patient needs continued PT services    Follow Up Recommendations  SNF    Does the patient have the potential to tolerate intense rehabilitation      Barriers to Discharge        Equipment Recommendations  None recommended by PT    Recommendations for Other Services OT consult   Frequency Min 3X/week    Precautions / Restrictions Precautions Precautions: Fall   Pertinent Vitals/Pain sats 90% 4 l      Mobility  Bed Mobility Bed Mobility: Rolling Right;Rolling Left Rolling Right: 1: +2 Total assist Rolling Right: Patient Percentage: 10% Rolling Left: 1: +2 Total assist Rolling Left: Patient Percentage: 10% Details for Bed Mobility Assistance: pt had difficulty gripping rail, dozes off. Placed pt's bed in chair position, pt was able to pull self to sitting upright and sat in position x 5 minutes burt was drowsy. Transfers Transfers: Not assessed    Shoulder Instructions     Exercises     PT Diagnosis: Generalized weakness  PT Problem List: Decreased strength;Decreased activity tolerance;Decreased mobility;Decreased knowledge of use of DME;Decreased cognition PT Treatment Interventions: DME instruction;Gait training;Functional mobility training;Therapeutic activities;Therapeutic exercise;Patient/family education   PT Goals Acute Rehab PT Goals PT Goal Formulation: Patient unable to participate in goal setting Time For Goal Achievement: 05/28/12 Potential to Achieve Goals: Good Pt will Roll Supine  to Right Side: with supervision PT Goal: Rolling Supine to Right Side - Progress: Goal set today Pt will Roll Supine to Left Side: with supervision PT Goal: Rolling Supine to Left Side - Progress: Goal set today Pt will go Supine/Side to Sit: with supervision PT Goal: Supine/Side to Sit - Progress: Goal set today Pt will go Sit to Stand: with mod assist PT Goal: Sit to Stand - Progress: Goal set today Pt will go Stand to Sit: with min assist PT Goal: Stand to Sit - Progress: Goal set today Pt will Transfer Bed to Chair/Chair to Bed: with mod assist PT Transfer Goal: Bed to Chair/Chair to Bed - Progress: Goal set today  Visit Information  Last PT Received On: 05/14/12 Assistance Needed: +2    Subjective Data  Subjective: I live with.. (pt's speech not clear) Pt. dozing off. Patient Stated Goal: Pt unable to state.   Prior Functioning  Home Living Additional Comments: Pt. was not coherent to provide information. Prior Function Comments: no family present, unknown Communication Communication:  (speech is not clear, slurred, pt dozing off/on)    Cognition  Overall Cognitive Status: No family/caregiver present to determine baseline cognitive functioning Area of Impairment: Attention;Following commands Arousal/Alertness: Lethargic Orientation Level: Place;Time Behavior During Session: Lethargic Attention - Other Comments: pt. dozing off/ on while trying to participate Following Commands: Follows one step commands inconsistently    Extremity/Trunk Assessment Right Upper Extremity Assessment RUE ROM/Strength/Tone: Deficits RUE ROM/Strength/Tone Deficits: poor effort by pt to move legs Left Upper Extremity Assessment LUE ROM/Strength/Tone: Deficits LUE ROM/Strength/Tone Deficits: same as R   Balance Balance Balance Assessed: Yes Static Sitting Balance Static Sitting - Comment/# of Minutes: st upright in bed with rails  x 5 minutes with min guard.  End of Session PT - End of  Session Activity Tolerance: Patient limited by fatigue Patient left: in bed;with call bell/phone within reach Nurse Communication: Mobility status  GP     Rada Hay 05/14/2012, 5:42 PM

## 2012-05-14 NOTE — Progress Notes (Signed)
[facility] PROGRESS NOTE  Tamlyn Sides ZOX:[id number] DOB: 1931-10-19 DOA: 111/22/2013 PCP: No primary provider on file.  Assessment/Plan:  is a 78 y.o. female Past medical history of diabetes2, hypertension and morbid obesity that comes in for cough, SOB,  and generalized weakness.  She also repeat lites paroxysmal nocturnal dyspnea, significant fatigue and tiredness with exertion, for the last 2 months. She also relates she has gained about 30 pounds.    1-Acute respiratory failure/ Acute diastolic heart failure, NYHA class 2  Hypercapnic Respiratory failure. She is likely chronic retainer. PCO2 decrease to 77, Ph at 7.4. That is probably her baseline.  BIPAP PRN.  She will need sleep study at some point.  Relates improvement SOB.  Change lasix to 40 mg TID patient with increase weight.  Continue with  Coreg.  ECHO pending.  Needs diet education.  Patient requiring 4 L oxygen, still hypoxic. She now relates a history of chest pain, doesn't remember when chest pain happen. I will order CT angio rule out PE.   2-Diabetes:  Hold lantus and glipizide due to hypoglycemia.  Hold metformin while inpatient.  SSI.  3-HTN: Hold Lisinopril due to mild renal insufficiency. Agree with stopping Norvasc.  4-Morbid obesity  - Counsleing  5-Cough/Congestion: will need to repeat Chest X ray at some point.  6-Renal Insufficiency/Mild uremia: Monitor on lasix. Hold lisinopril. Cr at 0,9.    Code Status: Full Family Communication: I spoke with Sister 12-16 and gave update.  Disposition Plan: Remain in step down.    Consultants:  None.  Procedures:  None.   Antibiotics: None.   HPI/Subjective: Patient relates improvement since admission of shortness of breath.  She feels stronger and able to hold thing better.   Objective: Filed Vitals:   05/14/12 0400 05/14/12 0513 05/14/12 0800 05/14/12 0801  BP:  156/81 158/84 158/84  Pulse:  83 85 87  Temp: 99 F (37.2 C)      TempSrc: Oral     Resp:  23 22   Height:      Weight:      SpO2:  89% 93%     Intake/Output Summary (Last 24 hours) at 05/14/12 0836 Last data filed at 05/14/12 0800  Gross per 24 hour  Intake    285 ml  Output   2770 ml  Net  -2485 ml   Filed Weights   05/11/12 2107 05/13/12 0640 05/13/12 1811  Weight: 139.1 kg (306 lb 10.6 oz) 131.634 kg (290 lb 3.2 oz) 137.1 kg (302 lb 4 oz)    Exam:   General:  No distress.  Cardiovascular: S 1, S 2 RRR  Respiratory: Crackles, ronchus diffuse.   Abdomen: Bs Present, soft, NT.  Data Reviewed: Basic Metabolic Panel:  Lab 05/14/12 8119 05/13/12 0906 05/12/12 0500 05/11/12 2230 05/11/12 1603  NA 138 139 140 137 140  K 3.8 4.0 4.5 4.3 4.9  CL 88* 90* 95* 95* 98  CO2 45* 43* 39* 37* 37*  GLUCOSE 107* 131* 61* 155* 81  BUN 24* 24* 27* 28* 26*  CREATININE 0.92 0.96 0.96 1.04 0.89  CALCIUM 9.1 9.0 9.0 8.9 9.6  MG -- -- -- -- --  PHOS -- -- -- -- --   Liver Function Tests:  Lab 05/11/12 2230  AST 16  ALT 12  ALKPHOS 58  BILITOT 0.2*  PROT 6.3  ALBUMIN 2.9*  CBC:  Lab 05/13/12 0906 05/11/12 2230 05/11/12 1603  WBC 8.5 7.8 8.7  NEUTROABS -- -- 5.3  HGB 12.9 11.5* 12.4  HCT 42.3 37.2 39.6  MCV 95.3 94.2 93.6  PLT 192 235 239   Cardiac Enzymes:  Lab 05/12/12 1019 05/12/12 0500 05/11/12 2230 05/11/12 1603  CKTOTAL -- -- -- --  CKMB -- -- -- --  CKMBINDEX -- -- -- --  TROPONINI <0.30 <0.30 <0.30 <0.30   BNP (last 3 results)  Basename 05/11/12 1603  PROBNP 2384.0*   CBG:  Lab 05/13/12 2213 05/13/12 1211 05/13/12 0705 05/12/12 2059 05/12/12 1727  GLUCAP 116* 141* 95 105* 98    Recent Results (from the past 240 hour(s))  URINE CULTURE     Status: Normal   Collection Time   05/11/12  6:29 PM      Component Value Range Status Comment   Specimen Description URINE, CLEAN CATCH   Final    Special Requests NONE   Final    Culture  Setup Time 05/12/2012 03:24   Final    Colony Count NO GROWTH   Final    Culture  NO GROWTH   Final    Report Status 05/13/2012 FINAL   Final   MRSA PCR SCREENING     Status: Normal   Collection Time   05/13/12  6:13 PM      Component Value Range Status Comment   MRSA by PCR NEGATIVE  NEGATIVE Final      Studies: No results found.  Scheduled Meds:   . ipratropium  0.5 mg Nebulization Q6H   And  . albuterol  2.5 mg Nebulization Q6H  . aspirin EC  81 mg Oral q morning - 10a  . atorvastatin  10 mg Oral QHS  . carvedilol  3.125 mg Oral BID WC  . cholecalciferol  1,000 Units Oral q morning - 10a  . docusate sodium  100 mg Oral QPM  . fluticasone  2 spray Each Nare Daily  . furosemide  40 mg Intravenous TID  . heparin  5,000 Units Subcutaneous Q8H  . insulin aspart  0-15 Units Subcutaneous TID WC  . insulin aspart  0-5 Units Subcutaneous QHS  . pantoprazole  40 mg Oral q morning - 10a  . polyvinyl alcohol  2 drop Both Eyes BID  . sodium chloride  3 mL Intravenous Q12H   Continuous Infusions:   Principal Problem:  *Acute respiratory failure Active Problems:  Acute diastolic heart failure, NYHA class 2  Diabetes mellitus  OA (osteoarthritis) of ankle  Hypertension  Morbid obesity    Time spent: 35 minutes.    REGALADO,BELKYS  [facility] Pager [phone]. If 8PM-8AM, please contact night-coverage at [email or web address], password [facility] 05/14/2012, 8:36 AM  LOS: 3 days

## 2012-05-14 NOTE — Clinical Documentation Improvement (Signed)
BMI DOCUMENTATION CLARIFICATION QUERY  THIS DOCUMENT IS NOT A PERMANENT PART OF THE MEDICAL RECORD  TO RESPOND TO THE THIS QUERY, FOLLOW THE INSTRUCTIONS BELOW:  1. If needed, update documentation for the patient's encounter via the notes activity.  2. Access this query again and click edit on the In [employer].  3. After updating, or not, click F2 to complete all highlighted (required) fields concerning your review. Select "additional documentation in the medical record" OR "no additional documentation provided".  4. Click Sign note button.  5. The deficiency will fall out of your In Basket *Please let us know if you are not able to complete this workflow by phone or e-mail (listed below).         05/14/12  Dear Dr. Sunnie Nielsen, B/Associates  In an effort to better capture your patient's severity of illness, reflect appropriate length of stay and utilization of resources, a review of the patient medical record has revealed the following indicators.    Based on your clinical judgment, please clarify and document in a progress note and/or discharge summary the clinical condition associated with the following supporting information:  In responding to this query please exercise your independent judgment.  The fact that a query is asked, does not imply that any particular answer is desired or expected.   According to HP pt with Morbid Obesity and OSA. For the OSA pt currently on BIPAP.  To accurately reflect the severity of illness (SOI) and risk of mortality in pt's condition please clarify if   Pt's OSA can be further specified as obesity hypoventilation syndrome or other diagnosis  Please clarify if you agree pt with Morbid Obesity/BMI=53.54 and document in pn or d/c summary.   Best Practice Alert:  Best practice for documenting Morbid obesity includes adding pt's BMI along with documentation of Morbid obesity.        Possible Clinical conditions  Morbid Obesity W/ BMI=    Obesity Hypoventilation Syndrome (OHS)  Other condition___________________  Cannot Clinically determine _____________  Risk Factors: Morbid Obesity, HTN, Acute Resp failure, Acute diastolic CHF, 30 lbs weight gain, dyspnea  Sign & Symptoms: HP 30 pound weight gain  obese probably with obstructive sleep apnea.  HT= 5\' 3"  (1.6 m)  Weight+302 lb 4 oz (137.1 kg) BMI= 53.54 kg/m2   Diagnostics: Lab:  Treatment Nutritional Consult on 05/13/12   Reviewed:  no additional documentation provided ljh  Thank You,  Enis Slipper  RN, BSN, MSN/Inf, CCDS Clinical Documentation Specialist [facility] Pager: [phone] / E-mail: [email or web address]  Health Information Management [place]

## 2012-05-14 NOTE — Progress Notes (Signed)
Dr. Carmell Austria, please call patient's sister Sheran Lawless tomorrow between the hours 1130am and 1pm to give updates on the patient at this number [phone].  If after 4pm, please call her at [phone]. Thanks!

## 2012-05-15 DIAGNOSIS — J9600 Acute respiratory failure, unspecified whether with hypoxia or hypercapnia: Secondary | ICD-10-CM | POA: Diagnosis not present

## 2012-05-15 DIAGNOSIS — I509 Heart failure, unspecified: Secondary | ICD-10-CM | POA: Diagnosis not present

## 2012-05-15 DIAGNOSIS — J948 Other specified pleural conditions: Secondary | ICD-10-CM

## 2012-05-15 DIAGNOSIS — R222 Localized swelling, mass and lump, trunk: Secondary | ICD-10-CM

## 2012-05-15 DIAGNOSIS — I5031 Acute diastolic (congestive) heart failure: Secondary | ICD-10-CM | POA: Diagnosis not present

## 2012-05-15 DIAGNOSIS — R0902 Hypoxemia: Secondary | ICD-10-CM | POA: Diagnosis not present

## 2012-05-15 DIAGNOSIS — I10 Essential (primary) hypertension: Secondary | ICD-10-CM | POA: Diagnosis not present

## 2012-05-15 LAB — BASIC METABOLIC PANEL
BUN: 30 mg/dL — ABNORMAL HIGH (ref 6–23)
CO2: 43 mEq/L (ref 19–32)
Chloride: 91 mEq/L — ABNORMAL LOW (ref 96–112)
Creatinine, Ser: 1.11 mg/dL — ABNORMAL HIGH (ref 0.50–1.10)
GFR calc Af Amer: 54 mL/min — ABNORMAL LOW (ref >90)
Potassium: 3.7 mEq/L (ref 3.5–5.1)

## 2012-05-15 LAB — CBC
HCT: 42.5 % (ref 36.0–46.0)
Hemoglobin: 13.1 g/dL (ref 12.0–15.0)
MCV: 93.8 fL (ref 78.0–100.0)
RBC: 4.53 MIL/uL (ref 3.87–5.11)
WBC: 8.4 10*3/uL (ref 4.0–10.5)

## 2012-05-15 LAB — GLUCOSE, CAPILLARY: Glucose-Capillary: 127 mg/dL — ABNORMAL HIGH (ref 70–99)

## 2012-05-15 MED ORDER — FUROSEMIDE 10 MG/ML IJ SOLN
40.0000 mg | Freq: Two times a day (BID) | INTRAMUSCULAR | Status: DC
  Administered 2012-05-16 – 2012-05-18 (×5): 40 mg via INTRAVENOUS
  Filled 2012-05-15 (×6): qty 4

## 2012-05-15 NOTE — Consult Note (Signed)
PULMONARY  / CRITICAL CARE MEDICINE  Name: Makayla Rodriguez MRN: [id number] DOB: [birth year]    LOS: 4  REFERRING MD :  [facility]  CHIEF COMPLAINT:  Persistent hypoxia  BRIEF PATIENT DESCRIPTION:  MO(302 lbs) female who is severely limited in her ADL and cannot walk with a walker more than 10 feet. Admitted on 12-13 with mild resp acidosis & CXR s/o edema. She was  aggressive diuresed -about 15L   and supplemental O2. She has OHS by ABGs with a PH of 7.4 and PCO2 of 77 and bicarb of 47.4. She has remained O2 dependent. Ct of chest was negative for PE but did show rt diaphragm elevation and lt pleural mass. PCCM asked to evaluate.   LINES / TUBES:   CULTURES: 12-13 uc>>neg  ANTIBIOTICS:   SIGNIFICANT EVENTS:   HISTORY OF PRESENT ILLNESS:   MO(302 lbs) female who is severely limited in her ADL and cannot walk with a walker more than 10 feet. Admitted on 12-13 and aggressive diuresis  And supplemental O2. She has OSA by ABGs with a PH of 7.4 and PCO2 of 77 and bicarb of 47.4. She has remained O2 dependent and probaly will need O2 post discharge along with Bipap.  PAST MEDICAL HISTORY :  Past Medical History  Diagnosis Date  . Diabetes mellitus without complication   . Hypertension   . Anxiety   . Arthritis   . Obesity   . CKD (chronic kidney disease), stage III    History reviewed. No pertinent past surgical history. Prior to Admission medications   Medication Sig Start Date End Date Taking? Authorizing Provider  ALPRAZolam (XANAX) 0.25 MG tablet Take 0.25 mg by mouth 2 (two) times daily as needed. For anxiety   Yes Historical Provider, MD  amLODipine (NORVASC) 5 MG tablet Take 5 mg by mouth every morning.   Yes Historical Provider, MD  aspirin EC 81 MG tablet Take 81 mg by mouth every morning.   Yes Historical Provider, MD  atorvastatin (LIPITOR) 10 MG tablet Take 10 mg by mouth at bedtime.   Yes Historical Provider, MD  busPIRone (BUSPAR) 15 MG tablet Take 15 mg by mouth 2 (two)  times daily.   Yes Historical Provider, MD  calcium carbonate (TUMS EX) 750 MG chewable tablet Chew 1 tablet by mouth daily as needed. For heartburn and indigestion   Yes Historical Provider, MD  cholecalciferol (VITAMIN D) 1000 UNITS tablet Take 1,000 Units by mouth every morning.   Yes Historical Provider, MD  cyclobenzaprine (FLEXERIL) 10 MG tablet Take 10 mg by mouth 3 (three) times daily as needed. For back pain   Yes Historical Provider, MD  diclofenac sodium (VOLTAREN) 1 % GEL Apply 4 g topically 4 (four) times daily as needed. For joint pain   Yes Historical Provider, MD  docusate sodium (COLACE) 100 MG capsule Take 100 mg by mouth every evening.   Yes Historical Provider, MD  fexofenadine (ALLEGRA) 180 MG tablet Take 180 mg by mouth at bedtime.   Yes Historical Provider, MD  fluticasone (FLONASE) 50 MCG/ACT nasal spray Place 2 sprays into the nose daily.   Yes Historical Provider, MD  furosemide (LASIX) 20 MG tablet Take 1-2 mg by mouth every morning. Take 1 tablet by mouth daily. If a dose is missed take 2 tablets the following day   Yes Historical Provider, MD  glipiZIDE (GLUCOTROL XL) 5 MG 24 hr tablet Take 5 mg by mouth every morning.   Yes Historical Provider, MD  ipratropium-albuterol (  DUONEB) 0.5-2.5 (3) MG/3ML SOLN Take 3 mLs by nebulization once. Administered at PCP office   Yes Historical Provider, MD  lisinopril (PRINIVIL,ZESTRIL) 20 MG tablet Take 20 mg by mouth every morning.   Yes Historical Provider, MD  metFORMIN (GLUCOPHAGE) 850 MG tablet Take 850 mg by mouth daily with breakfast.   Yes Historical Provider, MD  pantoprazole (PROTONIX) 40 MG tablet Take 40 mg by mouth every morning.   Yes Historical Provider, MD  Polyethyl Glycol-Propyl Glycol (SYSTANE PRESERVATIVE FREE) 0.4-0.3 % SOLN Place 2 drops into both eyes 2 (two) times daily.   Yes Historical Provider, MD  traMADol (ULTRAM) 50 MG tablet Take 50-100 mg by mouth 2 (two) times daily. Patient may use up to 2 tablets.  Typically uses 1 tablet twice daily   Yes Historical Provider, MD   Allergies  Allergen Reactions  . Demerol (Meperidine) Anaphylaxis    Reaction: Hypoventilation after surgery  . Penicillins Anaphylaxis  . Phenergan (Promethazine Hcl) Anaphylaxis    Reaction: hypoventilation  . Codeine Nausea And Vomiting  . Macrobid (Nitrofurantoin) Nausea And Vomiting  . Relafen (Nabumetone) Itching and Swelling    FAMILY HISTORY:  Family History  Problem Relation Age of Onset  . Heart failure Mother   . Gallbladder disease Father    SOCIAL HISTORY:  reports that she has quit smoking. Her smoking use included Cigarettes. She has a 20 pack-year smoking history. She does not have any smokeless tobacco history on file. She reports that she does not drink alcohol or use illicit drugs.  REVIEW OF SYSTEMS:  Unable to obtain - poor historian. Denies pain, dyspnea   INTERVAL HISTORY:   VITAL SIGNS: Temp:  [97.6 F (36.4 C)-99.6 F (37.6 C)] 98.6 F (37 C) (12/17 0800) Pulse Rate:  [73-95] 77  (12/17 1100) Resp:  [13-25] 25  (12/17 1100) BP: (107-136)/(49-109) 117/49 mmHg (12/17 1100) SpO2:  [89 %-93 %] 89 % (12/17 1100) FiO2 (%):  [50 %] 50 % (12/17 0400) HEMODYNAMICS:   VENTILATOR SETTINGS: Vent Mode:  [-]  FiO2 (%):  [50 %] 50 % INTAKE / OUTPUT: Intake/Output      12/16 0701 - 12/17 0700 12/17 0701 - 12/18 0700   P.O.     I.V. (mL/kg) 10 (0.1)    IV Piggyback 34    Total Intake(mL/kg) 44 (0.3)    Urine (mL/kg/hr) 3385 (1)    Total Output 3385    Net -3341           PHYSICAL EXAMINATION: General:  MO female.  Neuro:  Lethargic, sluggish, unable to carry on prolonged conversation. MAEx 4 but with pain  HEENT:  No LAN Cardiovascular:  HSD Lungs:  Decreased bs thru out Abdomen:  Obese.+bs, tender Musculoskeletal:  Intact but pain with any movement Skin: warm ++le edema   LABS: Cbc  Lab 05/15/12 0353 05/14/12 2024 05/13/12 0906  WBC 8.4 -- --  HGB 13.1 13.0 12.9  HCT  42.5 41.7 42.3  PLT 233 241 192    Chemistry   Lab 05/15/12 0353 05/14/12 0345 05/13/12 0906  NA 141 138 139  K 3.7 3.8 4.0  CL 91* 88* 90*  CO2 43* 45* 43*  BUN 30* 24* 24*  CREATININE 1.11* 0.92 0.96  CALCIUM 9.5 9.1 9.0  MG -- -- --  PHOS -- -- --  GLUCOSE 138* 107* 131*    Liver fxn  Lab 05/11/12 2230  AST 16  ALT 12  ALKPHOS 58  BILITOT 0.2*  PROT 6.3  ALBUMIN 2.9*   coags No results found for this basename: APTT:3,INR:3 in the last 168 hours Sepsis markers No results found for this basename: LATICACIDVEN:3,PROCALCITON:3 in the last 168 hours Cardiac markers  Lab 05/12/12 1019 05/12/12 0500 05/11/12 2230  CKTOTAL -- -- --  CKMB -- -- --  TROPONINI <0.30 <0.30 <0.30   BNP  Lab 05/11/12 1603  PROBNP 2384.0*   ABG  Lab 05/13/12 2015 05/13/12 1437  PHART 7.404 7.336*  PCO2ART 77.9* 88.5*  PO2ART 64.1* 64.2*  HCO3 47.4* 46.0*  TCO2 42.6 42.0    CBG trend  Lab 05/15/12 0806 05/14/12 2201 05/14/12 1731 05/14/12 1243 05/14/12 0748  GLUCAP 127* 160* 119* 122* 147*    IMAGING: Ct Angio Chest Pe W/cm &/or Wo Cm  05/14/2012  *RADIOLOGY REPORT*  Clinical Data: Persistent hypoxemia with cough and shortness of breath.  Question pulmonary embolism.  History of hypertension and diabetes.  CT ANGIOGRAPHY CHEST  Technique:  Multidetector CT imaging of the chest using the standard protocol during bolus administration of intravenous contrast. Multiplanar reconstructed images including MIPs were obtained and reviewed to evaluate the vascular anatomy.  Contrast:  100 ml Omnipaque-350 intravenously.  Comparison: Chest radiographs [id number].  Findings: The pulmonary arteries are well opacified with contrast. There is no evidence of acute pulmonary embolism.  There is mild atherosclerosis of the great vessels, aorta and coronary arteries. No enlarged mediastinal or hilar lymph nodes are present.  There is chronic elevation of the right hemidiaphragm associated with right  greater than left basilar atelectasis.  No consolidation or endobronchial lesion is seen.  There is trace pleural fluid on the left.  There is no significant right pleural effusion or pericardial effusion.  There is a calcified granuloma in the right upper lobe.  Images through the upper abdomen demonstrate a lenticular shaped soft tissue mass at the left costophrenic angle associated with the left eighth costal space.  This measures 5.9 x 3.1 cm transverse and likely involves the left hemidiaphragm.  There is mild mass effect on the spleen.  No osseous destruction or erosion is identified.  No focal abnormality of the right hemidiaphragm is seen.  The right adrenal gland demonstrates mild low density prominence.  IMPRESSION:  1.  No evidence of acute pulmonary embolism. 2.  Right greater lobe basilar atelectasis associated with chronic elevation of the right hemidiaphragm. 3.  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  Appearance is not entirely specific, and additional considerations would include subacute hematoma and other mesenchymal tumors.  Followup imaging may be helpful to evaluate stability.   Original Report Authenticated By: Carey Bullocks, M.D.     ECG: 12/14 nSR, first degree AVB  DIAGNOSES: Principal Problem:  *Acute respiratory failure Active Problems:  Acute diastolic heart failure, NYHA class 2  Diabetes mellitus  OA (osteoarthritis) of ankle  Hypertension  Morbid obesity   ASSESSMENT / PLAN:  PULMONARY  ASSESSMENT: MO female with OSA/OHS/Hypoxia despite negative fluid balance. Left pleural mass Elevated Rt hemidiaphragms`.  PLAN:   -No action on pleural mass needed at this time- FU CT in 9m -O2 to keep sats 88% no higher -NIMVS QHS due to OHS -Diuresis as tolerated -If cough is an issue then DC ACE-I  CARDIOVASCULAR  ASSESSMENT:  CHF, HTN, Volume overload PLAN:  -antihypertensive(consider  bystolic as more selective BB))  -diuresis  RENAL Lab Results  Component Value Date   CREATININE 1.11* 05/15/2012   CREATININE 0.92  05/14/2012   CREATININE 0.96 05/13/2012    ASSESSMENT:   CKD PLAN:   Per IM   NEUROLOGIC  ASSESSMENT:  Lethargic PLAN:   -dc xanax  CLINICAL SUMMARY: 78 yo with multiple health issues that contribute to her hypoxia. MO , CHF and OHS are main factors. DC all sedation, nocturnal bipap and address code status.   Brett Canales Minor ACNP [facility] Pager [phone] till 3 pm If no answer page [phone] 05/15/2012, 11:53 AM  I have personally obtained a history, examined the patient, evaluated laboratory and imaging results, formulated the assessment and plan and placed orders.  Oretha Milch    Pulmonary and Critical Care Medicine [facility] Pager: [phone]  05/15/2012, 11:53 AM

## 2012-05-15 NOTE — Progress Notes (Signed)
78yo mo female admitted on 12020/12/2411 with acute respiratory failure stepdown status, on venti-mask 50%/15L. Sats in low 90s, SR pacs with 1st deg AVB. Repositioned q2h, sitter at bedside. Foley to bedside drain with clear yellow urine.

## 2012-05-15 NOTE — Progress Notes (Addendum)
[facility] PROGRESS NOTE  Makayla Rodriguez VHQ:[id number] DOB: 1932-02-02 DOA: 130-Apr-202013 PCP: No primary provider on file.  Assessment/Plan:  78 y.o. female Past medical history of diabetes 2, hypertension and morbid obesity that comes in for cough, SOB, and generalized weakness. She also relates paroxysmal nocturnal dyspnea, significant fatigue and tiredness with exertion, for the last 2 months. She also relates she has gained about 30 pounds.   1-Acute respiratory failure/ Acute diastolic heart failure, NYHA class 2  Hypercapnic Respiratory failure. She is likely chronic retainer. PCO2 decrease to 77, Ph at 7.4. That is probably her baseline.  BIPAP PRN.  She will need sleep study , pulmonary might be able to help with arrangement.  Relates improvement SOB. Negative balance 14 L.  Change lasix to 40 mg BID due to increase cr.  Continue with Coreg.  ECHO EF 65% to 70%. Patient with persistent hypoxemia despite diuresis, CT angio was negative for PE. 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 I ask CCM/pulmonary for evaluation.   2-Diabetes:  Hold lantus and glipizide due to hypoglycemia.  Hold metformin while inpatient.  SSI. CBG 119 to 160. 3-HTN: Hold Lisinopril due to mild renal insufficiency. Norvasc was stop.  4-Morbid obesity  - Counsleing  5-Cough/Congestion: will need to repeat Chest X ray at some point.  6-Renal Insufficiency/Mild uremia: Monitor on lasix. Hold lisinopril.  Cr increase will decrease lasix to BID.   Code Status: Full  Family Communication:  Disposition Plan: Remain in step down.   Consultants:  Pulmonary/CCM. Procedures:  None.  Antibiotics:  None.     HPI/Subjective: Patient denies worsening SOB. No chest pain.   Objective: Filed Vitals:   05/15/12 0156 05/15/12 0300 05/15/12 0400 05/15/12 0800  BP:   115/52 110/56  Pulse:   76 86  Temp:   97.8 F (36.6 C)  98.6 F (37 C)  TempSrc:   Oral Axillary  Resp:  23 13   Height:      Weight:      SpO2: 93% 91% 89%     Intake/Output Summary (Last 24 hours) at 05/15/12 0850 Last data filed at 05/15/12 0549  Gross per 24 hour  Intake     44 ml  Output   2460 ml  Net  -2416 ml   Filed Weights   05/11/12 2107 05/13/12 0640 05/13/12 1811  Weight: 139.1 kg (306 lb 10.6 oz) 131.634 kg (290 lb 3.2 oz) 137.1 kg (302 lb 4 oz)    Exam:   General:  No distress.  Cardiovascular: S 1, S 2 RRR.  Respiratory: Crackles bases, decrease breath sound.  Abdomen: BS present, soft, NT.  Extremities; plus 2 edema.   Data Reviewed: Basic Metabolic Panel:  Lab 05/15/12 4132 05/14/12 0345 05/13/12 0906 05/12/12 0500 05/11/12 2230  NA 141 138 139 140 137  K 3.7 3.8 4.0 4.5 4.3  CL 91* 88* 90* 95* 95*  CO2 43* 45* 43* 39* 37*  GLUCOSE 138* 107* 131* 61* 155*  BUN 30* 24* 24* 27* 28*  CREATININE 1.11* 0.92 0.96 0.96 1.04  CALCIUM 9.5 9.1 9.0 9.0 8.9  MG -- -- -- -- --  PHOS -- -- -- -- --   Liver Function Tests:  Lab 05/11/12 2230  AST 16  ALT 12  ALKPHOS 58  BILITOT 0.2*  PROT 6.3  ALBUMIN 2.9*   CBC:  Lab 05/15/12 0353 05/14/12 2024 05/13/12 0906 05/11/12 2230 05/11/12  1603  WBC 8.4 8.4 8.5 7.8 8.7  NEUTROABS -- -- -- -- 5.3  HGB 13.1 13.0 12.9 11.5* 12.4  HCT 42.5 41.7 42.3 37.2 39.6  MCV 93.8 91.9 95.3 94.2 93.6  PLT 233 241 192 235 239   Cardiac Enzymes:  Lab 05/12/12 1019 05/12/12 0500 05/11/12 2230 05/11/12 1603  CKTOTAL -- -- -- --  CKMB -- -- -- --  CKMBINDEX -- -- -- --  TROPONINI <0.30 <0.30 <0.30 <0.30   BNP (last 3 results)  Basename 05/11/12 1603  PROBNP 2384.0*   CBG:  Lab 05/15/12 0806 05/14/12 2201 05/14/12 1731 05/14/12 1243 05/14/12 0748  GLUCAP 127* 160* 119* 122* 147*    Recent Results (from the past 240 hour(s))  URINE CULTURE     Status: Normal   Collection Time   05/11/12  6:29 PM      Component Value Range Status Comment   Specimen  Description URINE, CLEAN CATCH   Final    Special Requests NONE   Final    Culture  Setup Time 05/12/2012 03:24   Final    Colony Count NO GROWTH   Final    Culture NO GROWTH   Final    Report Status 05/13/2012 FINAL   Final   MRSA PCR SCREENING     Status: Normal   Collection Time   05/13/12  6:13 PM      Component Value Range Status Comment   MRSA by PCR NEGATIVE  NEGATIVE Final      Studies: Ct Angio Chest Pe W/cm &/or Wo Cm  05/14/2012  *RADIOLOGY REPORT*  Clinical Data: Persistent hypoxemia with cough and shortness of breath.  Question pulmonary embolism.  History of hypertension and diabetes.  CT ANGIOGRAPHY CHEST  Technique:  Multidetector CT imaging of the chest using the standard protocol during bolus administration of intravenous contrast. Multiplanar reconstructed images including MIPs were obtained and reviewed to evaluate the vascular anatomy.  Contrast:  100 ml Omnipaque-350 intravenously.  Comparison: Chest radiographs 12020-03-112.  Findings: The pulmonary arteries are well opacified with contrast. There is no evidence of acute pulmonary embolism.  There is mild atherosclerosis of the great vessels, aorta and coronary arteries. No enlarged mediastinal or hilar lymph nodes are present.  There is chronic elevation of the right hemidiaphragm associated with right greater than left basilar atelectasis.  No consolidation or endobronchial lesion is seen.  There is trace pleural fluid on the left.  There is no significant right pleural effusion or pericardial effusion.  There is a calcified granuloma in the right upper lobe.  Images through the upper abdomen demonstrate a lenticular shaped soft tissue mass at the left costophrenic angle associated with the left eighth costal space.  This measures 5.9 x 3.1 cm transverse and likely involves the left hemidiaphragm.  There is mild mass effect on the spleen.  No osseous destruction or erosion is identified.  No focal abnormality of the right  hemidiaphragm is seen.  The right adrenal gland demonstrates mild low density prominence.  IMPRESSION:  1.  No evidence of acute pulmonary embolism. 2.  Right greater lobe basilar atelectasis associated with chronic elevation of the right hemidiaphragm. 3.  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  Appearance is not entirely specific, and additional considerations would include subacute hematoma and other mesenchymal tumors.  Followup imaging may be helpful to evaluate stability.   Original Report Authenticated By: Carey Bullocks, M.D.  Scheduled Meds:   . ipratropium  0.5 mg Nebulization Q6H   And  . albuterol  2.5 mg Nebulization Q6H  . antiseptic oral rinse  15 mL Mouth Rinse BID  . aspirin EC  81 mg Oral q morning - 10a  . atorvastatin  10 mg Oral QHS  . carvedilol  3.125 mg Oral BID WC  . cholecalciferol  1,000 Units Oral q morning - 10a  . docusate sodium  100 mg Oral QPM  . fluticasone  2 spray Each Nare Daily  . furosemide  40 mg Intravenous TID  . heparin  5,000 Units Subcutaneous Q8H  . insulin aspart  0-15 Units Subcutaneous TID WC  . insulin aspart  0-5 Units Subcutaneous QHS  . pantoprazole  40 mg Oral q morning - 10a  . polyvinyl alcohol  2 drop Both Eyes BID  . sodium chloride  3 mL Intravenous Q12H   Continuous Infusions:   Principal Problem:  *Acute respiratory failure Active Problems:  Acute diastolic heart failure, NYHA class 2  Diabetes mellitus  OA (osteoarthritis) of ankle  Hypertension  Morbid obesity    Time spent: 35 minutes.    Makayla Rodriguez  [facility] Pager [phone]. If 8PM-8AM, please contact night-coverage at [email or web address], password [facility] 05/15/2012, 8:50 AM  LOS: 4 days

## 2012-05-16 ENCOUNTER — Inpatient Hospital Stay (HOSPITAL_COMMUNITY): Payer: Medicare Other

## 2012-05-16 DIAGNOSIS — I509 Heart failure, unspecified: Secondary | ICD-10-CM | POA: Diagnosis not present

## 2012-05-16 DIAGNOSIS — E119 Type 2 diabetes mellitus without complications: Secondary | ICD-10-CM | POA: Diagnosis not present

## 2012-05-16 DIAGNOSIS — I5031 Acute diastolic (congestive) heart failure: Secondary | ICD-10-CM | POA: Diagnosis not present

## 2012-05-16 DIAGNOSIS — J9600 Acute respiratory failure, unspecified whether with hypoxia or hypercapnia: Secondary | ICD-10-CM | POA: Diagnosis not present

## 2012-05-16 LAB — CBC
HCT: 42.5 % (ref 36.0–46.0)
Hemoglobin: 13.3 g/dL (ref 12.0–15.0)
MCHC: 31.3 g/dL (ref 30.0–36.0)
MCV: 92.6 fL (ref 78.0–100.0)
RDW: 13.2 % (ref 11.5–15.5)
WBC: 8.6 10*3/uL (ref 4.0–10.5)

## 2012-05-16 LAB — BLOOD GAS, ARTERIAL
Drawn by: 103701
Patient temperature: 98.6
pCO2 arterial: 57.2 mmHg (ref 35.0–45.0)
pH, Arterial: 7.486 — ABNORMAL HIGH (ref 7.350–7.450)

## 2012-05-16 LAB — BASIC METABOLIC PANEL
BUN: 37 mg/dL — ABNORMAL HIGH (ref 6–23)
Chloride: 93 mEq/L — ABNORMAL LOW (ref 96–112)
Creatinine, Ser: 1.17 mg/dL — ABNORMAL HIGH (ref 0.50–1.10)
Glucose, Bld: 125 mg/dL — ABNORMAL HIGH (ref 70–99)
Potassium: 4.1 mEq/L (ref 3.5–5.1)

## 2012-05-16 LAB — GLUCOSE, CAPILLARY
Glucose-Capillary: 130 mg/dL — ABNORMAL HIGH (ref 70–99)
Glucose-Capillary: 150 mg/dL — ABNORMAL HIGH (ref 70–99)

## 2012-05-16 MED ORDER — NEBIVOLOL HCL 5 MG PO TABS
5.0000 mg | ORAL_TABLET | Freq: Every day | ORAL | Status: DC
  Administered 2012-05-17 – 2012-05-20 (×4): 5 mg via ORAL
  Filled 2012-05-16 (×6): qty 1

## 2012-05-16 NOTE — Progress Notes (Signed)
[facility] PROGRESS NOTE  Makayla Rodriguez AVW:[id number] DOB: 04/24/1932 DOA: 111/01/2012 PCP: No primary provider on file.  Assessment/Plan:  78 y.o. female Past medical history of diabetes 2, hypertension and morbid obesity that comes in for cough, SOB, and generalized weakness. She also relates paroxysmal nocturnal dyspnea, significant fatigue and tiredness with exertion, for the last 2 months. She also relates she has gained about 30 pounds. Patient with persistent hypoxemia despite diuresis, CT angio was negative for PE. 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 I ask CCM/pulmonary for evaluation.   1-Acute respiratory failure/ Acute diastolic heart failure, NYHA class 2  Hypercapnic Respiratory failure. She is likely chronic retainer. PCO2 decrease to 57, Ph remains at 7.4. That is probably her baseline.  -Plan is for QHS Bipap and to have sleeping study as an outpatient. -Will follow Pulmonology recommendations -Daily supplemental oxygen with a goal of 88% O2 sat -continue diuresis as tolerated -Change to a more selective B-blocker (bystolic) -ECHO EF 65% to 70%. -d/c xanax due to lethargy and increase hypercapnia -CXR and ABG in am.  2-Diabetes:  Hold lantus and glipizide due to hypoglycemia.  Hold metformin while inpatient.  Continue SSI.  3-HTN: Holding Lisinopril due to renal insufficiency. Norvasc was stop due to soft BP.   4-Morbid obesity  - Counsleing for low calorie diet.  5-Cough/Congestion: will repeat Chest X ray in am   6-Renal Insufficiency/Mild uremia: Monitor on lasix. Continue holding lisinopril.  BMET in am. Continue current dose of lasix.   Code Status: Full  Family Communication: no family at bedside Disposition Plan: Remain in step down.   Consultants:  Pulmonary/CCM. Procedures:  None.  Antibiotics:  None.    HPI/Subjective: Patient is afebrile, slightly  better overall and more alert. No chest pain. Breathing more comfortable.  Objective: Filed Vitals:   05/16/12 1505 05/16/12 1600 05/16/12 1700 05/16/12 1735  BP: 107/45   139/49  Pulse: 74  74 74  Temp:  98.9 F (37.2 C)    TempSrc:  Oral    Resp: 17  14 20   Height:      Weight:      SpO2: 91%  92% 94%    Intake/Output Summary (Last 24 hours) at 05/16/12 1913 Last data filed at 05/16/12 1800  Gross per 24 hour  Intake   1080 ml  Output   1025 ml  Net     55 ml   Filed Weights   05/13/12 0640 05/13/12 1811 05/16/12 0500  Weight: 131.634 kg (290 lb 3.2 oz) 137.1 kg (302 lb 4 oz) 135.4 kg (298 lb 8.1 oz)    Exam:   General:  No distress. AAOX3; following commands and communicating properly  Cardiovascular: S1, S2 RRR.  Respiratory: Crackles bases, diffuse decrease breath sound bilaterally, no wheezing.  Abdomen: BS present, soft, NT.  Extremities; plus 2 edema.   Data Reviewed: Basic Metabolic Panel:  Lab 05/16/12 8295 05/15/12 0353 05/14/12 0345 05/13/12 0906 05/12/12 0500  NA 142 141 138 139 140  K 4.1 3.7 3.8 4.0 4.5  CL 93* 91* 88* 90* 95*  CO2 39* 43* 45* 43* 39*  GLUCOSE 125* 138* 107* 131* 61*  BUN 37* 30* 24* 24* 27*  CREATININE 1.17* 1.11* 0.92 0.96 0.96  CALCIUM 9.4 9.5 9.1 9.0 9.0  MG -- -- -- -- --  PHOS -- -- -- -- --   Liver Function Tests:  Lab 05/11/12 2230  AST 16  ALT 12  ALKPHOS 58  BILITOT 0.2*  PROT 6.3  ALBUMIN 2.9*   CBC:  Lab 05/16/12 0415 05/15/12 0353 05/14/12 2024 05/13/12 0906 05/11/12 2230 05/11/12 1603  WBC 8.6 8.4 8.4 8.5 7.8 --  NEUTROABS -- -- -- -- -- 5.3  HGB 13.3 13.1 13.0 12.9 11.5* --  HCT 42.5 42.5 41.7 42.3 37.2 --  MCV 92.6 93.8 91.9 95.3 94.2 --  PLT 227 233 241 192 235 --   Cardiac Enzymes:  Lab 05/12/12 1019 05/12/12 0500 05/11/12 2230 05/11/12 1603  CKTOTAL -- -- -- --  CKMB -- -- -- --  CKMBINDEX -- -- -- --  TROPONINI <0.30 <0.30 <0.30 <0.30   BNP (last 3 results)  Basename 05/11/12 1603   PROBNP 2384.0*   CBG:  Lab 05/16/12 1714 05/16/12 1152 05/16/12 0742 05/15/12 2247 05/15/12 1619  GLUCAP 102* 150* 130* 110* 92    Recent Results (from the past 240 hour(s))  URINE CULTURE     Status: Normal   Collection Time   05/11/12  6:29 PM      Component Value Range Status Comment   Specimen Description URINE, CLEAN CATCH   Final    Special Requests NONE   Final    Culture  Setup Time 05/12/2012 03:24   Final    Colony Count NO GROWTH   Final    Culture NO GROWTH   Final    Report Status 05/13/2012 FINAL   Final   MRSA PCR SCREENING     Status: Normal   Collection Time   05/13/12  6:13 PM      Component Value Range Status Comment   MRSA by PCR NEGATIVE  NEGATIVE Final      Studies: No results found.  Scheduled Meds:    . ipratropium  0.5 mg Nebulization Q6H   And  . albuterol  2.5 mg Nebulization Q6H  . antiseptic oral rinse  15 mL Mouth Rinse BID  . aspirin EC  81 mg Oral q morning - 10a  . atorvastatin  10 mg Oral QHS  . carvedilol  3.125 mg Oral BID WC  . cholecalciferol  1,000 Units Oral q morning - 10a  . docusate sodium  100 mg Oral QPM  . fluticasone  2 spray Each Nare Daily  . furosemide  40 mg Intravenous Q12H  . heparin  5,000 Units Subcutaneous Q8H  . insulin aspart  0-15 Units Subcutaneous TID WC  . insulin aspart  0-5 Units Subcutaneous QHS  . pantoprazole  40 mg Oral q morning - 10a  . polyvinyl alcohol  2 drop Both Eyes BID  . sodium chloride  3 mL Intravenous Q12H   Continuous Infusions:   Principal Problem:  *Acute respiratory failure Active Problems:  Acute diastolic heart failure, NYHA class 2  Diabetes mellitus  OA (osteoarthritis) of ankle  Hypertension  Morbid obesity  Pleural mass    Time spent: > 30 minutes.    [facility]  [facility] Pager [phone]. If 8PM-8AM, please contact night-coverage at [email or web address], password [facility] 05/16/2012, 7:13 PM  LOS: 5 days

## 2012-05-16 NOTE — Progress Notes (Addendum)
PULMONARY  / CRITICAL CARE MEDICINE  Name: Makayla Rodriguez MRN: [id number] DOB: 07/31/1931    LOS: 5  REFERRING MD :  [facility]  CHIEF COMPLAINT:  Persistent hypoxia  BRIEF PATIENT DESCRIPTION:  MO(302 lbs) female who is severely limited in her ADL and cannot walk with a walker more than 10 feet. Admitted on 12-13 with mild resp acidosis & CXR s/o edema. She was  aggressive diuresed -about 15L   and supplemental O2. She has OHS by ABGs with a PH of 7.4 and PCO2 of 77 and bicarb of 47.4. She has remained O2 dependent. Ct of chest was negative for PE but did show rt diaphragm elevation and lt pleural mass. PCCM asked to evaluate.   LINES / TUBES:   CULTURES: 12-13 uc>>neg  ANTIBIOTICS:   SIGNIFICANT EVENTS:   VITAL SIGNS: Temp:  [98.2 F (36.8 C)-100.2 F (37.9 C)] 98.2 F (36.8 C) (12/18 0800) Pulse Rate:  [73-89] 83  (12/18 0900) Resp:  [11-30] 21  (12/18 0900) BP: (113-140)/(41-59) 121/50 mmHg (12/18 0800) SpO2:  [86 %-99 %] 93 % (12/18 0906) FiO2 (%):  [40 %-50 %] 40 % (12/18 0600) Weight:  [135.4 kg (298 lb 8.1 oz)] 135.4 kg (298 lb 8.1 oz) (12/18 0500) HEMODYNAMICS:   VENTILATOR SETTINGS: Vent Mode:  [-]  FiO2 (%):  [40 %-50 %] 40 % INTAKE / OUTPUT: Intake/Output      12/17 0701 - 12/18 0700 12/18 0701 - 12/19 0700   P.O. 360    I.V. (mL/kg)     IV Piggyback     Total Intake(mL/kg) 360 (2.7)    Urine (mL/kg/hr) 1100 (0.3) 125   Total Output 1100 125   Net -740 -125          PHYSICAL EXAMINATION: General:  MO female.  Neuro:  Lethargic, sluggish, unable to carry on prolonged conversation. MAEx 4 but with pain . More awake HEENT:  No LAN Cardiovascular:  HSD Lungs:  Decreased bs thru out Abdomen:  Obese.+bs, tender Musculoskeletal:  Intact but pain with any movement Skin: warm ++le edema   LABS: Cbc  Lab 05/16/12 0415 05/15/12 0353 05/14/12 2024  WBC 8.6 -- --  HGB 13.3 13.1 13.0  HCT 42.5 42.5 41.7  PLT 227 233 241    Chemistry   Lab 05/16/12  0415 05/15/12 0353 05/14/12 0345  NA 142 141 138  K 4.1 3.7 3.8  CL 93* 91* 88*  CO2 39* 43* 45*  BUN 37* 30* 24*  CREATININE 1.17* 1.11* 0.92  CALCIUM 9.4 9.5 9.1  MG -- -- --  PHOS -- -- --  GLUCOSE 125* 138* 107*    Liver fxn  Lab 05/11/12 2230  AST 16  ALT 12  ALKPHOS 58  BILITOT 0.2*  PROT 6.3  ALBUMIN 2.9*   coags No results found for this basename: APTT:3,INR:3 in the last 168 hours Sepsis markers No results found for this basename: LATICACIDVEN:3,PROCALCITON:3 in the last 168 hours Cardiac markers  Lab 05/12/12 1019 05/12/12 0500 05/11/12 2230  CKTOTAL -- -- --  CKMB -- -- --  TROPONINI <0.30 <0.30 <0.30   BNP  Lab 05/11/12 1603  PROBNP 2384.0*   ABG  Lab 05/13/12 2015 05/13/12 1437  PHART 7.404 7.336*  PCO2ART 77.9* 88.5*  PO2ART 64.1* 64.2*  HCO3 47.4* 46.0*  TCO2 42.6 42.0    CBG trend  Lab 05/16/12 0742 05/15/12 2247 05/15/12 1619 05/15/12 1129 05/15/12 0806  GLUCAP 130* 110* 92 104* 127*    IMAGING:  Ct Angio Chest Pe W/cm &/or Wo Cm  05/14/2012  *RADIOLOGY REPORT*  Clinical Data: Persistent hypoxemia with cough and shortness of breath.  Question pulmonary embolism.  History of hypertension and diabetes.  CT ANGIOGRAPHY CHEST  Technique:  Multidetector CT imaging of the chest using the standard protocol during bolus administration of intravenous contrast. Multiplanar reconstructed images including MIPs were obtained and reviewed to evaluate the vascular anatomy.  Contrast:  100 ml Omnipaque-350 intravenously.  Comparison: Chest radiographs 12020/12/1911.  Findings: The pulmonary arteries are well opacified with contrast. There is no evidence of acute pulmonary embolism.  There is mild atherosclerosis of the great vessels, aorta and coronary arteries. No enlarged mediastinal or hilar lymph nodes are present.  There is chronic elevation of the right hemidiaphragm associated with right greater than left basilar atelectasis.  No consolidation or  endobronchial lesion is seen.  There is trace pleural fluid on the left.  There is no significant right pleural effusion or pericardial effusion.  There is a calcified granuloma in the right upper lobe.  Images through the upper abdomen demonstrate a lenticular shaped soft tissue mass at the left costophrenic angle associated with the left eighth costal space.  This measures 5.9 x 3.1 cm transverse and likely involves the left hemidiaphragm.  There is mild mass effect on the spleen.  No osseous destruction or erosion is identified.  No focal abnormality of the right hemidiaphragm is seen.  The right adrenal gland demonstrates mild low density prominence.  IMPRESSION:  1.  No evidence of acute pulmonary embolism. 2.  Right greater lobe basilar atelectasis associated with chronic elevation of the right hemidiaphragm. 3.  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  Appearance is not entirely specific, and additional considerations would include subacute hematoma and other mesenchymal tumors.  Followup imaging may be helpful to evaluate stability.   Original Report Authenticated By: Carey Bullocks, M.D.    .     ECG: 12/14 nSR, first degree AVB  DIAGNOSES: Principal Problem:  *Acute respiratory failure Active Problems:  Acute diastolic heart failure, NYHA class 2  Diabetes mellitus  OA (osteoarthritis) of ankle  Hypertension  Morbid obesity  Pleural mass   ASSESSMENT / PLAN:  PULMONARY  ASSESSMENT: MO female with OSA/OHS/Hypoxia despite negative fluid balance. Left pleural mass Elevated Rt hemidiaphragms`.  PLAN:   -No action on pleural mass needed at this time- FU CT in 52m -O2 to keep sats 88% no higher -NIMVS QHS due to OHS - will need Bipap on discharge & sleep study arranged as oupt (Dr Vassie Loll to read) -Diuresis as tolerated - DC ACE-I -check c x r and abg  CARDIOVASCULAR  ASSESSMENT:  CHF, HTN, Volume overload,  cor pulmonale PLAN:  -antihypertensive(consider bystolic as more selective BB))  -diuresis  RENAL Lab Results  Component Value Date   CREATININE 1.17* 05/16/2012   CREATININE 1.11* 05/15/2012   CREATININE 0.92 05/14/2012    ASSESSMENT:   CKD PLAN:   Per IM   NEUROLOGIC  ASSESSMENT:  Lethargic, better 12-18 off xanax PLAN:   -dc xanax  CLINICAL SUMMARY: 78 yo with multiple health issues that contribute to her hypoxia. MO , CHF and OHS are main factors. DC all sedation, nocturnal bipap and address code status.   Brett Canales Minor ACNP [facility] Pager [phone] till 3 pm If no answer page [phone] 05/16/2012, 10:06 AM  I have personally obtained a history, examined the patient, evaluated  laboratory and imaging results, formulated the assessment and plan and placed orders.  Cebert Dettmann V.

## 2012-05-17 ENCOUNTER — Inpatient Hospital Stay (HOSPITAL_COMMUNITY): Payer: Medicare Other

## 2012-05-17 ENCOUNTER — Encounter (HOSPITAL_COMMUNITY): Payer: Self-pay | Admitting: *Deleted

## 2012-05-17 DIAGNOSIS — R222 Localized swelling, mass and lump, trunk: Secondary | ICD-10-CM | POA: Diagnosis not present

## 2012-05-17 DIAGNOSIS — J398 Other specified diseases of upper respiratory tract: Secondary | ICD-10-CM | POA: Diagnosis not present

## 2012-05-17 DIAGNOSIS — J9600 Acute respiratory failure, unspecified whether with hypoxia or hypercapnia: Secondary | ICD-10-CM | POA: Diagnosis not present

## 2012-05-17 DIAGNOSIS — I509 Heart failure, unspecified: Secondary | ICD-10-CM | POA: Diagnosis not present

## 2012-05-17 DIAGNOSIS — M19079 Primary osteoarthritis, unspecified ankle and foot: Secondary | ICD-10-CM

## 2012-05-17 DIAGNOSIS — E119 Type 2 diabetes mellitus without complications: Secondary | ICD-10-CM | POA: Diagnosis not present

## 2012-05-17 DIAGNOSIS — I5031 Acute diastolic (congestive) heart failure: Secondary | ICD-10-CM | POA: Diagnosis not present

## 2012-05-17 DIAGNOSIS — I10 Essential (primary) hypertension: Secondary | ICD-10-CM | POA: Diagnosis not present

## 2012-05-17 LAB — GLUCOSE, CAPILLARY: Glucose-Capillary: 130 mg/dL — ABNORMAL HIGH (ref 70–99)

## 2012-05-17 MED ORDER — VITAMINS A & D EX OINT
TOPICAL_OINTMENT | CUTANEOUS | Status: AC
  Filled 2012-05-17: qty 5

## 2012-05-17 MED ORDER — TRAMADOL HCL 50 MG PO TABS
50.0000 mg | ORAL_TABLET | Freq: Four times a day (QID) | ORAL | Status: DC | PRN
  Administered 2012-05-17 – 2012-05-19 (×3): 50 mg via ORAL
  Filled 2012-05-17 (×3): qty 1

## 2012-05-17 NOTE — Progress Notes (Signed)
Physical Therapy Treatment Patient Details Name: Makayla Rodriguez MRN: [id number] DOB: 1931-07-30 Today's Date: 05/17/2012 Time: 1191-4782 PT Time Calculation (min): 33 min  PT Assessment / Plan / Recommendation Comments on Treatment Session  Pt is more alert today. Speech is soft and at times difficult to understand at times. Pt. states she lives alone and has caregivers. ?if 24/7. Pt. did not fully provide info. Pt. is weak, takes extra time  for activity. Recommend SNF at this time.    Follow Up Recommendations  SNF     Does the patient have the potential to tolerate intense rehabilitation     Barriers to Discharge        Equipment Recommendations  None recommended by PT    Recommendations for Other Services OT consult  Frequency     Plan Discharge plan remains appropriate;Frequency remains appropriate    Precautions / Restrictions Precautions Precautions: Fall Restrictions Weight Bearing Restrictions: Yes   Pertinent Vitals/Pain satsdrop to 88% on RA, left O2 at 2l during activity with  sats at 90%   Mobility  Bed Mobility Bed Mobility: Supine to Sit Supine to Sit: 3: Mod assist;HOB elevated;With rails Details for Bed Mobility Assistance: slow to initiate and follow through with activity.frequent cues to stay on track of activity. Transfers Transfers: Sit to Stand;Stand to Sit;Stand Pivot Transfers Sit to Stand: 1: +2 Total assist;With armrests;From bed;From elevated surface Sit to Stand: Patient Percentage: 50% Stand to Sit: 1: +2 Total assist;To elevated surface;With upper extremity assist Stand Pivot Transfers: 4: Min assist;1: +2 Total assist Stand Pivot Transfers: Patient Percentage: 50% Details for Transfer Assistance: pt. takes extra time to get to recliner, much encouragement to pivot. Decreased weight bear tolerated on RLE Ambulation/Gait Ambulation/Gait Assistance: Not tested (comment)    Exercises     PT Diagnosis:    PT Problem List:   PT Treatment  Interventions:     PT Goals Acute Rehab PT Goals Pt will go Supine/Side to Sit: with supervision PT Goal: Supine/Side to Sit - Progress: Progressing toward goal Pt will go Stand to Sit: with min assist PT Goal: Stand to Sit - Progress: Progressing toward goal Pt will Transfer Bed to Chair/Chair to Bed: with mod assist PT Transfer Goal: Bed to Chair/Chair to Bed - Progress: Progressing toward goal  Visit Information  Last PT Received On: 05/17/12 Assistance Needed: +2    Subjective Data  Subjective: I hurt   Cognition  Overall Cognitive Status: Difficult to assess Area of Impairment: Following commands;Safety/judgement Arousal/Alertness: Awake/alert Orientation Level: Appears intact for tasks assessed Attention - Other Comments: slow to respond to commands, Following Commands: Follows one step commands consistently    Balance  Static Sitting Balance Static Sitting - Balance Support: Bilateral upper extremity supported;Feet supported Static Sitting - Level of Assistance: 5: Stand by assistance Static Sitting - Comment/# of Minutes: x 8 minutes  End of Session PT - End of Session Activity Tolerance: Patient limited by fatigue Patient left: in chair;with call bell/phone within reach Nurse Communication: Mobility status;Need for lift equipment   GP     Rada Hay 05/17/2012, 11:41 AM

## 2012-05-17 NOTE — Progress Notes (Signed)
OT Note:  Pt did not feel up to working with OT this afternoon.  Will reattempt tomorrow.  [place], OTR/L [phone] 05/17/2012

## 2012-05-17 NOTE — Progress Notes (Signed)
PULMONARY  / CRITICAL CARE MEDICINE  Name: Makayla Rodriguez MRN: [id number] DOB: Mar 30, 1932    LOS: 6  REFERRING MD :  [facility]  CHIEF COMPLAINT:  Persistent hypoxia  BRIEF PATIENT DESCRIPTION:  MO(302 lbs) female who is severely limited in her ADL and cannot walk with a walker more than 10 feet. Admitted on 12-13 with mild resp acidosis & CXR s/o edema. She was  aggressive diuresed -about 15L   and supplemental O2. She has OHS by ABGs with a PH of 7.4 and PCO2 of 77 and bicarb of 47.4. She has remained O2 dependent. Ct of chest was negative for PE but did show rt diaphragm elevation and lt pleural mass. PCCM asked to evaluate.   LINES / TUBES:   CULTURES: 12-13 uc>>neg  ANTIBIOTICS:   SIGNIFICANT EVENTS:   VITAL SIGNS: Temp:  [98.3 F (36.8 C)-98.9 F (37.2 C)] 98.3 F (36.8 C) (12/19 0725) Pulse Rate:  [61-83] 71  (12/19 0319) Resp:  [12-24] 12  (12/19 0319) BP: (107-139)/(42-61) 138/60 mmHg (12/19 0725) SpO2:  [91 %-97 %] 96 % (12/19 0319) FiO2 (%):  [40 %] 40 % (12/19 0030) Weight:  [130.9 kg (288 lb 9.3 oz)] 130.9 kg (288 lb 9.3 oz) (12/19 0600) HEMODYNAMICS:   VENTILATOR SETTINGS: Vent Mode:  [-]  FiO2 (%):  [40 %] 40 % INTAKE / OUTPUT: Intake/Output      12/18 0701 - 12/19 0700 12/19 0701 - 12/20 0700   P.O. 1320    Total Intake(mL/kg) 1320 (10.1)    Urine (mL/kg/hr) 1575 (0.5)    Total Output 1575    Net -255           PHYSICAL EXAMINATION: General:  MO female.  Neuro: Less  lethargic, sluggish, able to carry on short  conversation. MAEx 4 but with pain . More awake HEENT:  No LAN Cardiovascular:  HSD Lungs:  Decreased bs thru out Abdomen:  Obese.+bs, tender Musculoskeletal:  Intact but pain with any movement Skin: warm ++le edema   LABS: Cbc  Lab 05/16/12 0415 05/15/12 0353 05/14/12 2024  WBC 8.6 -- --  HGB 13.3 13.1 13.0  HCT 42.5 42.5 41.7  PLT 227 233 241    Chemistry   Lab 05/16/12 0415 05/15/12 0353 05/14/12 0345  NA 142 141 138  K  4.1 3.7 3.8  CL 93* 91* 88*  CO2 39* 43* 45*  BUN 37* 30* 24*  CREATININE 1.17* 1.11* 0.92  CALCIUM 9.4 9.5 9.1  MG -- -- --  PHOS -- -- --  GLUCOSE 125* 138* 107*    Liver fxn  Lab 05/11/12 2230  AST 16  ALT 12  ALKPHOS 58  BILITOT 0.2*  PROT 6.3  ALBUMIN 2.9*   coags No results found for this basename: APTT:3,INR:3 in the last 168 hours Sepsis markers No results found for this basename: LATICACIDVEN:3,PROCALCITON:3 in the last 168 hours Cardiac markers  Lab 05/12/12 1019 05/12/12 0500 05/11/12 2230  CKTOTAL -- -- --  CKMB -- -- --  TROPONINI <0.30 <0.30 <0.30   BNP  Lab 05/11/12 1603  PROBNP 2384.0*   ABG  Lab 05/16/12 1301 05/13/12 2015 05/13/12 1437  PHART 7.486* 7.404 7.336*  PCO2ART 57.2* 77.9* 88.5*  PO2ART 61.5* 64.1* 64.2*  HCO3 42.7* 47.4* 46.0*  TCO2 37.3 42.6 42.0    CBG trend  Lab 05/16/12 2118 05/16/12 1714 05/16/12 1152 05/16/12 0742 05/15/12 2247  GLUCAP 147* 102* 150* 130* 110*    IMAGING: Dg Chest 1 View  05/17/2012  *RADIOLOGY REPORT*  Clinical Data: Follow up pulmonary status.  CHEST - 1 VIEW  Comparison: 1Oct 10, 202013.  Findings: The heart is borderline enlarged but stable.  The mediastinal and hilar contours are prominent but unchanged. Interval worsening lung aeration with interstitial thickening and peribronchial thickening likely pulmonary edema.  Probable small left effusion and bibasilar atelectasis.  IMPRESSION: Worsening lung aeration likely due to pulmonary edema.   Original Report Authenticated By: Rudie Meyer, M.D.    Marland Kitchen    .     ECG: 12/14 nSR, first degree AVB  DIAGNOSES: Principal Problem:  *Acute respiratory failure Active Problems:  Acute diastolic heart failure, NYHA class 2  Diabetes mellitus  OA (osteoarthritis) of ankle  Hypertension  Morbid obesity  Pleural mass   ASSESSMENT / PLAN:  PULMONARY  ASSESSMENT: MO female with OSA/OHS/Hypoxia despite negative fluid balance. Left pleural mass Elevated  Rt hemidiaphragms`.  PLAN:   -No action on pleural mass needed at this time- FU CT in 56m -O2 to keep sats 88% no higher -NIMVS QHS due to OHS - will need Bipap on discharge & sleep study arranged as oupt (Dr Vassie Loll to read)- arranged for Tuesday jan 21st at 8pm  -Diuresis as tolerated - DC ACE-I -check c x r and abg(reviewed 12-19) confirms OHS but improved with nocturnal bipap. -can dc on autoset bipap -OOB , continue pt  CARDIOVASCULAR  ASSESSMENT:  CHF, HTN, Volume overload, cor pulmonale PLAN:  -antihypertensive(consider bystolic as more selective BB))  -diuresis as tolerated  RENAL Lab Results  Component Value Date   CREATININE 1.17* 05/16/2012   CREATININE 1.11* 05/15/2012   CREATININE 0.92 05/14/2012    ASSESSMENT:   CKD PLAN:   Per IM   NEUROLOGIC  ASSESSMENT:  Lethargic, better 12-19 off xanax PLAN:   -dc xanax  CLINICAL SUMMARY: 78 yo with multiple health issues that contribute to her hypoxia. MO , CHF and OHS are main factors. DC all sedation, nocturnal bipap and address code status. See will need home AutoSet bipap with )2 bleed in to keep sats 88%.    Brett Canales Minor ACNP [facility] PCCM Pager [phone] till 3 pm If no answer page [phone] 05/17/2012, 8:04 AM  I have personally obtained a history, examined the patient, evaluated laboratory and imaging results, formulated the assessment and plan and placed orders.   Makayla Rodriguez,Makayla V.

## 2012-05-17 NOTE — Progress Notes (Signed)
[facility] PROGRESS NOTE  Makayla Rodriguez AVW:[id number] DOB: 1932-01-17 DOA: 1Dec 01, 202013 PCP: No primary provider on file.  Assessment/Plan: 1-Acute respiratory failure/ Acute diastolic heart failure, NYHA class 2/Hypercapnic Respiratory failure. She is a chronic retainer. PCO2 decrease to 57 on (12/18), Ph remains at 7.4. That is probably her baseline.  -Plan is for QHS Bipap and to have sleeping study as an outpatient. -Will follow Pulmonology recommendations -Daily supplemental oxygen with a goal of 88% O2 sat -continue diuresis as tolerated -Continue selective B-blocker (bystolic) -ECHO EF 65% to 70% (diastolic CHF) -d/c xanax due to lethargy and increase hypercapnia -Will transfer patient to floor -Start autopeep and will have sleep study as an outpatient. -OOB/PT to be continue.  2-Diabetes:  Hold lantus and glipizide due to hypoglycemia.  Hold metformin while inpatient.  Continue SSI.  3-HTN: Holding Lisinopril due to renal insufficiency. Started on low dose bystolic. BP stable. Also receiving lasix.  4-Morbid obesity  - Counsleing for low calorie diet.  5-Cough/Congestion: Chest X demonstrating worsening appearence for vascular congestion and edema. Will continue lasix.ray in am   6-Renal Insufficiency/Mild uremia: Monitor on lasix. Continue holding lisinopril.  BMET in am. Continue current dose of lasix.   7-Pleural mass: will repeat CT in 6 months.   Code Status: Full  Family Communication: no family at bedside Disposition Plan: Remain in step down.   Consultants:  Pulmonary/CCM. Procedures:  None.  Antibiotics:  None.    HPI/Subjective: Patient is afebrile, continue to be AAOX3. No chest pain. Breathing better.  Objective: Filed Vitals:   05/17/12 0600 05/17/12 0725 05/17/12 0800 05/17/12 0912  BP:  138/60 144/56   Pulse:   72   Temp:  98.3 F (36.8 C) 97.8 F (36.6 C)   TempSrc:  Oral Oral   Resp:   20   Height:      Weight: 130.9 kg (288  lb 9.3 oz)     SpO2:   92% 92%    Intake/Output Summary (Last 24 hours) at 05/17/12 1034 Last data filed at 05/17/12 0900  Gross per 24 hour  Intake   1205 ml  Output   1950 ml  Net   -745 ml   Filed Weights   05/13/12 1811 05/16/12 0500 05/17/12 0600  Weight: 137.1 kg (302 lb 4 oz) 135.4 kg (298 lb 8.1 oz) 130.9 kg (288 lb 9.3 oz)    Exam:   General:  No distress. AAOX3; following commands and communicating properly  Cardiovascular: S1, S2 RRR.  Respiratory: still with some crackles at bases, diffuse decrease breath sound bilaterally, no wheezing.  Abdomen: BS present, soft, NT.  Extremities; plus 2 edema.   Neuro: weak and deconditioned, no focal deficit.  Data Reviewed: Basic Metabolic Panel:  Lab 05/16/12 8295 05/15/12 0353 05/14/12 0345 05/13/12 0906 05/12/12 0500  NA 142 141 138 139 140  K 4.1 3.7 3.8 4.0 4.5  CL 93* 91* 88* 90* 95*  CO2 39* 43* 45* 43* 39*  GLUCOSE 125* 138* 107* 131* 61*  BUN 37* 30* 24* 24* 27*  CREATININE 1.17* 1.11* 0.92 0.96 0.96  CALCIUM 9.4 9.5 9.1 9.0 9.0  MG -- -- -- -- --  PHOS -- -- -- -- --   Liver Function Tests:  Lab 05/11/12 2230  AST 16  ALT 12  ALKPHOS 58  BILITOT 0.2*  PROT 6.3  ALBUMIN 2.9*   CBC:  Lab 05/16/12 0415 05/15/12 0353 05/14/12 2024 05/13/12 0906 05/11/12 2230 05/11/12 1603  WBC 8.6 8.4 8.4  8.5 7.8 --  NEUTROABS -- -- -- -- -- 5.3  HGB 13.3 13.1 13.0 12.9 11.5* --  HCT 42.5 42.5 41.7 42.3 37.2 --  MCV 92.6 93.8 91.9 95.3 94.2 --  PLT 227 233 241 192 235 --   Cardiac Enzymes:  Lab 05/12/12 1019 05/12/12 0500 05/11/12 2230 05/11/12 1603  CKTOTAL -- -- -- --  CKMB -- -- -- --  CKMBINDEX -- -- -- --  TROPONINI <0.30 <0.30 <0.30 <0.30   BNP (last 3 results)  Basename 05/11/12 1603  PROBNP 2384.0*   CBG:  Lab 05/17/12 0757 05/16/12 2118 05/16/12 1714 05/16/12 1152 05/16/12 0742  GLUCAP 130* 147* 102* 150* 130*    Recent Results (from the past 240 hour(s))  URINE CULTURE     Status:  Normal   Collection Time   05/11/12  6:29 PM      Component Value Range Status Comment   Specimen Description URINE, CLEAN CATCH   Final    Special Requests NONE   Final    Culture  Setup Time 05/12/2012 03:24   Final    Colony Count NO GROWTH   Final    Culture NO GROWTH   Final    Report Status 05/13/2012 FINAL   Final   MRSA PCR SCREENING     Status: Normal   Collection Time   05/13/12  6:13 PM      Component Value Range Status Comment   MRSA by PCR NEGATIVE  NEGATIVE Final      Studies: Dg Chest 1 View  05/17/2012  *RADIOLOGY REPORT*  Clinical Data: Follow up pulmonary status.  CHEST - 1 VIEW  Comparison: 103-31-2013.  Findings: The heart is borderline enlarged but stable.  The mediastinal and hilar contours are prominent but unchanged. Interval worsening lung aeration with interstitial thickening and peribronchial thickening likely pulmonary edema.  Probable small left effusion and bibasilar atelectasis.  IMPRESSION: Worsening lung aeration likely due to pulmonary edema.   Original Report Authenticated By: Rudie Meyer, M.D.     Scheduled Meds:    . ipratropium  0.5 mg Nebulization Q6H   And  . albuterol  2.5 mg Nebulization Q6H  . antiseptic oral rinse  15 mL Mouth Rinse BID  . aspirin EC  81 mg Oral q morning - 10a  . atorvastatin  10 mg Oral QHS  . cholecalciferol  1,000 Units Oral q morning - 10a  . docusate sodium  100 mg Oral QPM  . fluticasone  2 spray Each Nare Daily  . furosemide  40 mg Intravenous Q12H  . heparin  5,000 Units Subcutaneous Q8H  . insulin aspart  0-15 Units Subcutaneous TID WC  . insulin aspart  0-5 Units Subcutaneous QHS  . nebivolol  5 mg Oral Daily  . pantoprazole  40 mg Oral q morning - 10a  . polyvinyl alcohol  2 drop Both Eyes BID  . sodium chloride  3 mL Intravenous Q12H    Time spent: > 30 minutes.    [facility]  [facility] Pager [phone]. If 8PM-8AM, please contact night-coverage at [email or web address], password  [facility] 05/17/2012, 10:34 AM  LOS: 6 days

## 2012-05-18 DIAGNOSIS — E119 Type 2 diabetes mellitus without complications: Secondary | ICD-10-CM | POA: Diagnosis not present

## 2012-05-18 DIAGNOSIS — I509 Heart failure, unspecified: Secondary | ICD-10-CM | POA: Diagnosis not present

## 2012-05-18 DIAGNOSIS — I10 Essential (primary) hypertension: Secondary | ICD-10-CM | POA: Diagnosis not present

## 2012-05-18 DIAGNOSIS — J9600 Acute respiratory failure, unspecified whether with hypoxia or hypercapnia: Secondary | ICD-10-CM | POA: Diagnosis not present

## 2012-05-18 LAB — BASIC METABOLIC PANEL
BUN: 45 mg/dL — ABNORMAL HIGH (ref 6–23)
Calcium: 9.7 mg/dL (ref 8.4–10.5)
Creatinine, Ser: 1.2 mg/dL — ABNORMAL HIGH (ref 0.50–1.10)
GFR calc non Af Amer: 42 mL/min — ABNORMAL LOW (ref >90)
Glucose, Bld: 137 mg/dL — ABNORMAL HIGH (ref 70–99)
Sodium: 134 mEq/L — ABNORMAL LOW (ref 135–145)

## 2012-05-18 LAB — GLUCOSE, CAPILLARY
Glucose-Capillary: 145 mg/dL — ABNORMAL HIGH (ref 70–99)
Glucose-Capillary: 139 mg/dL — ABNORMAL HIGH (ref 70–99)

## 2012-05-18 MED ORDER — MENTHOL 3 MG MT LOZG
1.0000 | LOZENGE | OROMUCOSAL | Status: DC | PRN
  Filled 2012-05-18: qty 9

## 2012-05-18 MED ORDER — FUROSEMIDE 40 MG PO TABS
40.0000 mg | ORAL_TABLET | Freq: Two times a day (BID) | ORAL | Status: DC
  Administered 2012-05-18 – 2012-05-21 (×6): 40 mg via ORAL
  Filled 2012-05-18 (×8): qty 1

## 2012-05-18 NOTE — Progress Notes (Signed)
Clinical Social Work Department CLINICAL SOCIAL WORK PLACEMENT NOTE 05/18/2012  Patient:  Makayla Rodriguez, Makayla Rodriguez  Account Number:  [id number] Admit date:  112-08-2011  Clinical Social Worker:  Jacelyn Grip  Date/time:  05/18/2012 04:00 PM  Clinical Social Work is seeking post-discharge placement for this patient at the following level of care:   SKILLED NURSING   (*CSW will update this form in Epic as items are completed)   05/18/2012  Patient/family provided with [facility] Department of Clinical Social Work's list of facilities offering this level of care within the geographic area requested by the patient (or if unable, by the patient's family).  05/18/2012  Patient/family informed of their freedom to choose among providers that offer the needed level of care, that participate in Medicare, Medicaid or managed care program needed by the patient, have an available bed and are willing to accept the patient.  05/18/2012  Patient/family informed of [facility]' ownership interest in [facility], as well as of the fact that they are under no obligation to receive care at this facility.  PASARR submitted to EDS on 05/18/2012 PASARR number received from EDS on 05/18/2012  FL2 transmitted to all facilities in geographic area requested by pt/family on  05/18/2012 FL2 transmitted to all facilities within larger geographic area on   Patient informed that his/her managed care company has contracts with or will negotiate with  certain facilities, including the following:     Patient/family informed of bed offers received:   Patient chooses bed at  Physician recommends and patient chooses bed at    Patient to be transferred to  on   Patient to be transferred to facility by   The following physician request were entered in Epic:   Additional Comments:  Jacklynn Lewis, MSW, LCSWA  Clinical Social Work [phone]

## 2012-05-18 NOTE — Progress Notes (Signed)
[facility] PROGRESS NOTE  Joneisha Miles XBJ:[id number] DOB: [birth year] DOA: 107/26/2013 PCP: No primary provider on file.  Assessment/Plan: 1-Acute respiratory failure/ Acute diastolic heart failure, NYHA class 2/Hypercapnic Respiratory failure. She is a chronic retainer. PCO2 decrease to 57 on (12/18), Ph remains at 7.4. That is probably her baseline.  -Plan is for QHS Bipap and to have sleeping study as an outpatient. -Daily supplemental oxygen with a goal of 88% O2 sat -Lasix changed to by mouth 40 mg twice a day. -Continue selective B-blocker (bystolic) -ECHO EF 65% to 70% (diastolic CHF) -Avoid xanax due to lethargy and increase hypercapnia -Start autopeep and will have sleep study as an outpatient. -OOB/PT to be continue. -Start flutter valve -Repeat x-ray in a.m.  2-Diabetes:  Hold lantus and glipizide due to hypoglycemia.  Hold metformin while inpatient.  Continue SSI.  3-HTN: Continue Holding Lisinopril due to renal insufficiency. Started on low dose bystolic. BP stable. Also receiving lasix.  4-Morbid obesity  - Counsleing for low calorie diet.  5-Cough/Congestion: Patient moving more air and without crackles on examination. Will repeat x-ray in a.m. Lasix will be changed to by mouth 40 mg twice a day.  6-Renal Insufficiency/Mild uremia: Monitor on lasix. Continue holding lisinopril.  Creatinine 1.2. At this point will change her Lasix to by mouth. Continue monitoring patient's kidney function.  7-Pleural mass: will repeat CT in 6 months. Further workup and treatment to be decided at that point depending on mass behavior.   8-physical deconditioning: Per physical therapy recommendations patient will require SNF. At this moment after discuss with social worker patient did no have any folder days left for a skilled nursing facility admissions; she qualifies for LTAC and the plan is to ask [facility] to evaluate on patient's condition and needs for discharge plans. Case  manager and social worker more help him at discharge.  9-sore throat: Most likely secondary to constant use of oxygen and also BiPAP at night. Will use Cepacol as needed. No erythema or exudates appreciated on exam.  Code Status: Full  Family Communication: no family at bedside Disposition Plan: Remain in step down.   Consultants:  Pulmonary/CCM. CM/social worker   Procedures:  None.    Antibiotics:  None.    HPI/Subjective: Patient is afebrile, no chest pain, reports significant improvement in her breathing. Overnight refused BiPAP. Complaining of sore throat.  Objective: Filed Vitals:   05/17/12 2150 05/18/12 0550 05/18/12 0834 05/18/12 1435  BP: 111/62 133/65  106/67  Pulse: 67 65  80  Temp: 98.3 F (36.8 C) 98.3 F (36.8 C)  98.6 F (37 C)  TempSrc: Oral Oral  Oral  Resp: 18 16  16   Height:      Weight:  130.636 kg (288 lb)    SpO2: 90% 94% 92% 97%    Intake/Output Summary (Last 24 hours) at 05/18/12 1647 Last data filed at 05/18/12 1436  Gross per 24 hour  Intake   1085 ml  Output   1901 ml  Net   -816 ml   Filed Weights   05/16/12 0500 05/17/12 0600 05/18/12 0550  Weight: 135.4 kg (298 lb 8.1 oz) 130.9 kg (288 lb 9.3 oz) 130.636 kg (288 lb)    Exam:   General:  No distress. AAOX3; following commands properly; afebrile and without any acute complaints  Cardiovascular: S1, S2 RRR.  Respiratory: No crackles, no wheezing, improved air movement, scattered rhonchi.   Abdomen: BS present, soft, NT.  Extremities; plus 2 edema.   Neuro: weak  and deconditioned, no focal deficit.  Data Reviewed: Basic Metabolic Panel:  Lab 05/18/12 1610 05/16/12 0415 05/15/12 0353 05/14/12 0345 05/13/12 0906  NA 134* 142 141 138 139  K 3.7 4.1 3.7 3.8 4.0  CL 87* 93* 91* 88* 90*  CO2 38* 39* 43* 45* 43*  GLUCOSE 137* 125* 138* 107* 131*  BUN 45* 37* 30* 24* 24*  CREATININE 1.20* 1.17* 1.11* 0.92 0.96  CALCIUM 9.7 9.4 9.5 9.1 9.0  MG -- -- -- -- --  PHOS -- --  -- -- --   Liver Function Tests:  Lab 05/11/12 2230  AST 16  ALT 12  ALKPHOS 58  BILITOT 0.2*  PROT 6.3  ALBUMIN 2.9*   CBC:  Lab 05/16/12 0415 05/15/12 0353 05/14/12 2024 05/13/12 0906 05/11/12 2230  WBC 8.6 8.4 8.4 8.5 7.8  NEUTROABS -- -- -- -- --  HGB 13.3 13.1 13.0 12.9 11.5*  HCT 42.5 42.5 41.7 42.3 37.2  MCV 92.6 93.8 91.9 95.3 94.2  PLT 227 233 241 192 235   Cardiac Enzymes:  Lab 05/12/12 1019 05/12/12 0500 05/11/12 2230  CKTOTAL -- -- --  CKMB -- -- --  CKMBINDEX -- -- --  TROPONINI <0.30 <0.30 <0.30   BNP (last 3 results)  Basename 05/11/12 1603  PROBNP 2384.0*   CBG:  Lab 05/18/12 1139 05/18/12 0801 05/17/12 2154 05/17/12 1701 05/17/12 1212  GLUCAP 186* 147* 145* 136* 169*    Recent Results (from the past 240 hour(s))  URINE CULTURE     Status: Normal   Collection Time   05/11/12  6:29 PM      Component Value Range Status Comment   Specimen Description URINE, CLEAN CATCH   Final    Special Requests NONE   Final    Culture  Setup Time 05/12/2012 03:24   Final    Colony Count NO GROWTH   Final    Culture NO GROWTH   Final    Report Status 05/13/2012 FINAL   Final   MRSA PCR SCREENING     Status: Normal   Collection Time   05/13/12  6:13 PM      Component Value Range Status Comment   MRSA by PCR NEGATIVE  NEGATIVE Final      Studies: Dg Chest 1 View  05/17/2012  *RADIOLOGY REPORT*  Clinical Data: Follow up pulmonary status.  CHEST - 1 VIEW  Comparison: 12020/07/2511.  Findings: The heart is borderline enlarged but stable.  The mediastinal and hilar contours are prominent but unchanged. Interval worsening lung aeration with interstitial thickening and peribronchial thickening likely pulmonary edema.  Probable small left effusion and bibasilar atelectasis.  IMPRESSION: Worsening lung aeration likely due to pulmonary edema.   Original Report Authenticated By: Rudie Meyer, M.D.     Scheduled Meds:    . ipratropium  0.5 mg Nebulization Q6H   And   . albuterol  2.5 mg Nebulization Q6H  . antiseptic oral rinse  15 mL Mouth Rinse BID  . aspirin EC  81 mg Oral q morning - 10a  . atorvastatin  10 mg Oral QHS  . cholecalciferol  1,000 Units Oral q morning - 10a  . docusate sodium  100 mg Oral QPM  . fluticasone  2 spray Each Nare Daily  . furosemide  40 mg Intravenous Q12H  . heparin  5,000 Units Subcutaneous Q8H  . insulin aspart  0-15 Units Subcutaneous TID WC  . insulin aspart  0-5 Units Subcutaneous QHS  . nebivolol  5 mg Oral Daily  . pantoprazole  40 mg Oral q morning - 10a  . polyvinyl alcohol  2 drop Both Eyes BID  . sodium chloride  3 mL Intravenous Q12H    Time spent: < 30 minutes.    [facility]  [facility] Pager [phone]. If 8PM-8AM, please contact night-coverage at [email or web address], password [facility] 05/18/2012, 4:47 PM  LOS: 7 days

## 2012-05-18 NOTE — Progress Notes (Signed)
After several attempts of pt using facial mask for bipap. Pt refused to put mask back on. RT called and pt placed on [place]. Will continue to monitor.

## 2012-05-18 NOTE — Progress Notes (Signed)
05/18/12 1145 Two attempts made to change IV that were unsuccessful. Paged IV team. IV team called back and will come by to try for a new IV.

## 2012-05-18 NOTE — Progress Notes (Signed)
CARE MANAGEMENT NOTE 05/18/2012  Patient:  Makayla Rodriguez, Makayla Rodriguez   Account Number:  [id number]  Date Initiated:  05/15/2012  Documentation initiated by:  DAVIS,RHONDA  Subjective/Objective Assessment:   pt with resp failure  transferred to sdu on [id number] due to hypoxia and increased lethgary, sluured speech. Placed on bipap     Action/Plan:   from home but may need snf   Anticipated DC Date:  05/21/2012   Anticipated DC Plan:  SKILLED NURSING FACILITY  In-house referral  Clinical Social Worker      DC Planning Services  CM consult      [facility] Choice  LONG TERM ACUTE CARE   Choice offered to / List presented to:  C-1 Patient   DME arranged  NA      DME agency  NA     HH arranged  NA      HH agency  NA   Status of service:  In process, will continue to follow Medicare Important Message given?  NA - LOS <3 / Initial given by admissions (If response is "NO", the following Medicare IM given date fields will be blank) Date Medicare IM given:   Date Additional Medicare IM given:    Discharge Disposition:    Per UR Regulation:  Reviewed for med. necessity/level of care/duration of stay  If discussed at Long Length of Stay Meetings, dates discussed:    Comments:  [id number]/Rhonda Davis,Rn,BSN,CCM combined consult with patient, ssiter and csw to discuss next disposition for this patient. Information about what ltac care is and a online vitural tour of [facility] hospital done with both.  If patient decides to go to [facility] she would preferr Dr. Angelina Ok who is a female as opposed to a female md.  Per. Kathlene November at [facility] this is a viable option.  message to Hospitalist for update. [id number] Davis,RN,BSN,CCM: spoke with patient and with sister concerning poss discharge and move to [facility] to continue with rehab and o2 requirements.  Patient does have care givers 16 hours a day in the home, all but  two hours are covered by family members.  the concern is the uncovered two hours since  patient does become confused and does attempt to get up -high fall risk!  Will speak with sister and patient together later today. [id number] Earlene Plater, RN, BSN, CCM: CHART REVIEWED AND UPDATED.  Next chart review due on [id number]. NO DISCHARGE NEEDS PRESENT AT THIS TIME. CASE MANAGEMENT [id number]

## 2012-05-18 NOTE — Evaluation (Signed)
Occupational Therapy Evaluation Patient Details Name: Makayla Rodriguez MRN: [id number] DOB: 04/30/1932 Today's Date: 05/18/2012 Time: 1324-4010 OT Time Calculation (min): 24 min  OT Assessment / Plan / Recommendation Clinical Impression  Pt is a 78 y/o female dx ARF whom presents with limitations/deficits in ADL's and selfcare tasks (generalized weakness, deconditioning) whom should benefit from acute OT to assist with maximizing I prior to recommended SNF rehab vs 24hr S/Assist & HHOT only if she progresses quickly. Pt states that she had care attendant assistance daily, however level of assist is unclear and not 24/7 per her report. Rec bariatric commode in pt room    OT Assessment  Patient needs continued OT Services    Follow Up Recommendations  SNF;Other (comment) (SNF vs 24hr assist/supervision if pt progresses quickly)    Barriers to Discharge Other (comment) (Lives alone with personal care attendents in/out) Unknown level of I PTA, pt was receiving assist w/ ADL's and homemaking however pt unable to fully state, no family present. Cont to assess.  Equipment Recommendations  Tub/shower seat;Other (comment) (Bariatirc shower seat w/ back)    Recommendations for Other Services    Frequency  Min 2X/week    Precautions / Restrictions Precautions Precautions: Fall Restrictions Weight Bearing Restrictions: No   Pertinent Vitals/Pain  Faces: Legs "hurt a little more" if pressed by someone    ADL  Eating/Feeding: Performed;Set up Where Assessed - Eating/Feeding: Bed level Grooming: Performed;Wash/dry hands;Wash/dry face;Teeth care;Brushing hair;Minimal assistance Where Assessed - Grooming: Supported sitting Upper Body Bathing: Simulated;Minimal assistance Where Assessed - Upper Body Bathing: Supported sitting Lower Body Bathing: Performed;+1 Total assistance Where Assessed - Lower Body Bathing: Supported standing Upper Body Dressing: Simulated;Minimal assistance Where Assessed  - Upper Body Dressing: Supported sitting Lower Body Dressing: Performed;Maximal assistance;Other (comment) (Don/doff socks bed level) Where Assessed - Lower Body Dressing: Supine, head of bed up Toilet Transfer: Simulated;+2 Total assistance Toilet Transfer: Patient Percentage: 80% Toilet Transfer Method: Sit to stand;Stand pivot Toilet Transfer Equipment: Extra wide bedside commode Toileting - Clothing Manipulation and Hygiene: Performed;+1 Total assistance;+2 Total assistance Toileting - Clothing Manipulation and Hygiene: Patient Percentage: 20% Where Assessed - Glass blower/designer Manipulation and Hygiene: Standing Tub/Shower Transfer Method: Not assessed Equipment Used: Rolling walker Transfers/Ambulation Related to ADLs: Pt required +2 assist with increased time for tasks. Takes very short steps & benefits from vc's for hand placement and safety ADL Comments: Pt is a 78 y/o female dx ARF whom presents with limitations/deficits in ADL's and selfcare tasks whom should benefit from acute OT to assist with maximizing I prior to recommended SNF rehab vs 24hr S/Assist & HHOT if she progresses quickly. Pt states that she had care attendant assistance daily, however level of assist is unclear and not 24/7 per her report. Rec bariatric commode in pt room.    OT Diagnosis: Generalized weakness  OT Problem List: Decreased strength;Decreased activity tolerance;Decreased knowledge of use of DME or AE;Cardiopulmonary status limiting activity;Obesity;Pain OT Treatment Interventions: Self-care/ADL training;Energy conservation;DME and/or AE instruction;Therapeutic activities;Patient/family education   OT Goals Acute Rehab OT Goals OT Goal Formulation: With patient Time For Goal Achievement: 06/08/12 Potential to Achieve Goals: Good ADL Goals Pt Will Perform Grooming: Sitting, edge of bed;Sitting at sink;Sitting, chair;with set-up ADL Goal: Grooming - Progress: Goal set today Pt Will Perform Upper Body  Bathing: with set-up;Sitting, chair;Sitting, edge of bed;Sitting at sink ADL Goal: Upper Body Bathing - Progress: Goal set today Pt Will Perform Lower Body Bathing: Sit to stand from chair;Sit to stand from bed;with adaptive equipment;with  mod assist ADL Goal: Lower Body Bathing - Progress: Goal set today Pt Will Perform Upper Body Dressing: with set-up;with supervision;Sitting, chair;Sitting, bed ADL Goal: Upper Body Dressing - Progress: Goal set today Pt Will Perform Lower Body Dressing: with mod assist;Sit to stand from bed;Sit to stand from chair;with adaptive equipment ADL Goal: Lower Body Dressing - Progress: Goal set today Pt Will Transfer to Toilet: with supervision;with min assist;Extra wide 3-in-1;Ambulation;with DME ADL Goal: Toilet Transfer - Progress: Goal set today Pt Will Perform Toileting - Clothing Manipulation: with supervision;Sitting on 3-in-1 or toilet ADL Goal: Toileting - Clothing Manipulation - Progress: Goal set today Pt Will Perform Toileting - Hygiene: with supervision;Sitting on 3-in-1 or toilet;Leaning right and/or left on 3-in-1/toilet ADL Goal: Toileting - Hygiene - Progress: Goal set today Additional ADL Goal #1: Pt will verbalize 2-3 energy conservation techniques that she can self implement during functional activity and ADL's ADL Goal: Additional Goal #1 - Progress: Goal set today  Visit Information  Last OT Received On: 05/18/12 Assistance Needed: +2    Subjective Data  Subjective: I need my scrunchie elastic thing for my hair   Prior Functioning     Home Living Lives With: Alone Available Help at Discharge: Available PRN/intermittently;Personal care attendant;Other (Comment) (pt reports 16 hr/day assist, unclear if 7 days/week) Bathroom Shower/Tub: Health visitor: Standard Bathroom Accessibility: Yes How Accessible: Accessible via walker Home Adaptive Equipment: Built-in shower seat;Walker - four wheeled Prior Function Level of  Independence: Needs assistance Needs Assistance: Bathing;Dressing;Meal Prep;Light Housekeeping Bath: Other (comment) (Unknown pt unable to state, cont to assess) Dressing: Other (comment) (Unknown pt unable to state, cont to assess) Meal Prep: Other (comment) (Pt states "yes" when asked about meal prep) Light Housekeeping: Other (comment) ("Yes") Dominant Hand: Right    Vision/Perception  Pt has macular degeneration, has prism glasses and magnifier in room, she reports that she does not use them. No changes from baseline.   Cognition  Overall Cognitive Status: Appears within functional limits for tasks assessed/performed Arousal/Alertness: Awake/alert Orientation Level: Appears intact for tasks assessed Behavior During Session: [facility] for tasks performed Attention - Other Comments: pt able to attend to task respond to commands both verbally and motorically Following Commands: Follows one step commands consistently    Extremity/Trunk Assessment Right Upper Extremity Assessment RUE ROM/Strength/Tone: WFL for tasks assessed RUE ROM/Strength/Tone Deficits: Pt demonstrates limitations in full shoulder flexion that she states is due to arthritis. Left Upper Extremity Assessment LUE ROM/Strength/Tone: WFL for tasks assessed LUE ROM/Strength/Tone Deficits: Pt demonstrates limitations with full shoulder flexion that she states is due to arthritis.     Mobility Bed Mobility Bed Mobility: Supine to Sit Rolling Left: 3: Mod assist;With rail Supine to Sit: 1: +1 Total assist Supine to Sit: Patient Percentage: 50% Details for Bed Mobility Assistance: Pt able to initate mobility.  Appears to be participating better and quicker in therapy, though still with some slowness and decreased endurance Nasal O2 maintained and some dyspnea observed Transfers Transfers: Sit to Stand;Stand to Sit Sit to Stand: With upper extremity assist;From bed;Other (comment);1: +2 Total assist (Pt holding onto RW, therapist  stabilizing in the middle.) Sit to Stand: Patient Percentage: 70% Stand to Sit: 3: Mod assist;To chair/3-in-1 Details for Transfer Assistance: Pt requires increased time for tasks vc's/tc's for hand placement and to back up to recliner, takes very short steps.        Exercise General Exercises - Lower Extremity Ankle Circles/Pumps: AROM;Both;5 reps;Supine Quad Sets: AROM;Both;5 reps;Seated Gluteal Sets: AROM;Both;10  reps;Standing Long Arc Quad: AROM;Both;5 reps;Seated Hip ABduction/ADduction: Both;Seated (isometric) Hip Flexion/Marching: AAROM;Both;5 reps;Seated   Balance Static Sitting Balance Static Sitting - Balance Support: No upper extremity supported Static Sitting - Level of Assistance: 5: Stand by assistance Static Standing Balance Static Standing - Balance Support: Bilateral upper extremity supported Static Standing - Level of Assistance: 5: Stand by assistance Static Standing - Comment/# of Minutes: 30 seconds x 2 to stand with RW with cues to do intermittent glute sets and trunk extension. Pt reports fatigue at end of session   End of Session OT - End of Session Equipment Utilized During Treatment: Other (comment) (Bariatric RW) Activity Tolerance: Patient limited by fatigue Patient left: in chair;with call bell/phone within reach;Other (comment) (P.T. in room)  GO     Alm Bustard 05/18/2012, 10:16 AM

## 2012-05-18 NOTE — Clinical Social Work Psychosocial (Signed)
     Clinical Social Work Department BRIEF PSYCHOSOCIAL ASSESSMENT 05/18/2012  Patient:  Makayla Rodriguez, Makayla Rodriguez     Account Number:  [id number]     Admit date:  12020/08/511  Clinical Social Worker:  Jacelyn Grip  Date/Time:  05/18/2012 04:00 PM  Referred by:  Physician  Date Referred:  05/18/2012 Referred for  SNF Placement   Other Referral:   SNF vs LTAC   Interview type:  Patient Other interview type:   and patient sister    PSYCHOSOCIAL DATA Living Status:  ALONE Admitted from facility:   Level of care:   Primary support name:  Sheran Lawless Lineberry/[phone] Primary support relationship to patient:  SIBLING Degree of support available:   strong    CURRENT CONCERNS Current Concerns  Post-Acute Placement   Other Concerns:    SOCIAL WORK ASSESSMENT / PLAN CSW received referral for New SNF placement. CSW discussed with RNCM who had pt pre-screened for [facility] and pt was appropriate for [facility]. CSW and RNCM met with pt and pt sister at bedside to discuss options.    Discussed options for discharge plan including LTAC vs SNF placement. RNCM clarified questions re: [facility] and this CSW clarified questions in regard to SNF and discussed that few SNF facilities are able to manage BiPap which MD is recommending for pt. Pt and pt sister plan to discuss options of LTAC vs SNF to make decision. Pt is agreeable to SNF search in [facility] to see if any facilities are able to manage BiPap. Pt sister did report that they are leaning toward [facility], but would like to have the option available. CSW completed FL2 and initiated SNF search. CSW will ask weekend CSW to follow up with pt and pt sister in regard to bed offers and get an update on family decision about LTAC vs SNF. CSW to continue to follow.   Assessment/plan status:  Psychosocial Support/Ongoing Assessment of Needs Other assessment/ plan:   discharge planning   Information/referral to community  resources:   Referral to [facility] SNF's    PATIENTS/FAMILYS RESPONSE TO PLAN OF CARE: Pt alert and oriented. Pt and pt sister are weighing options for discharge plan, but stated they are leaning toward [facility]. CSW initiated SNF search for secondary options. Pt and pt sister appreciative of CSW and [facility] assistance.

## 2012-05-18 NOTE — Progress Notes (Addendum)
Physical Therapy Treatment Patient Details Name: Makayla Rodriguez MRN: [id number] DOB: 01-14-1932 Today's Date: 05/18/2012 Time: 7253-6644 PT Time Calculation (min): 34 min  PT Assessment / Plan / Recommendation Comments on Treatment Session  Pt continues to improve daily, but contines to have functional impairment and decreased endurance.  She will benefit from continued Pt at SNF prior to return to home    Follow Up Recommendations   SNF     Does the patient have the potential to tolerate intense rehabilitation     Barriers to Discharge        Equipment Recommendations       Recommendations for Other Services    Frequency     Plan Discharge plan remains appropriate;Frequency remains appropriate    Precautions / Restrictions Precautions Precautions: Fall Restrictions Weight Bearing Restrictions: No   Pertinent Vitals/Pain Pt with edema in Legs and abdomen that is painful to touch/pressure    Mobility  Bed Mobility Bed Mobility: Supine to Sit Rolling Left: 3: Mod assist;With rail Supine to Sit: 1: +1 Total assist Supine to Sit: Patient Percentage: 50% Details for Bed Mobility Assistance: Pt able to initate mobility.  Appears to be participating better and quicker in therapy, though still with some slowness and decreased endurance Nasal O2 maintained and some dyspnea observed Transfers Transfers: Sit to Stand;Stand to Sit Sit to Stand: With upper extremity assist;From bed;Other (comment);1: +2 Total assist (Pt holding onto RW, therapist stabilizing in the middle.) Sit to Stand: Patient Percentage: 70% Stand to Sit: 3: Mod assist;To chair/3-in-1 Stand Pivot Transfers: 4: Min assist;1: +2 Total assist Stand Pivot Transfers: Patient Percentage: 50% Details for Transfer Assistance: Pt requires increased time for tasks vc's/tc's for hand placement and to back up to recliner, takes very short steps. Ambulation/Gait Ambulation/Gait Assistance: 4: Min assist Ambulation Distance  (Feet): 3 Feet Assistive device: Rolling walker (bariatric) Ambulation/Gait Assistance Details: pt able to use RW for support but moves very slowly Needs encouragement to stand erect and acativate glutes trunk extensors Gait Pattern: Decreased step length - right;Decreased step length - left;Trunk flexed Gait velocity: decreased General Gait Details: difficulty with ambulation with slowness and dyspnea even with nasal O2 Stairs: No Wheelchair Mobility Wheelchair Mobility: No    Exercises General Exercises - Lower Extremity Ankle Circles/Pumps: AROM;Both;5 reps;Supine Quad Sets: AROM;Both;5 reps;Seated Gluteal Sets: AROM;Both;10 reps;Standing Long Arc Quad: AROM;Both;5 reps;Seated Hip ABduction/ADduction: Both;Seated (isometric) Hip Flexion/Marching: AAROM;Both;5 reps;Seated   PT Diagnosis:    PT Problem List:   PT Treatment Interventions:     PT Goals Acute Rehab PT Goals Pt will go Supine/Side to Sit: with supervision PT Goal: Supine/Side to Sit - Progress: Progressing toward goal PT Goal: Sit to Stand - Progress: Progressing toward goal Pt will go Stand to Sit: with min assist PT Goal: Stand to Sit - Progress: Progressing toward goal Pt will Transfer Bed to Chair/Chair to Bed: with mod assist PT Transfer Goal: Bed to Chair/Chair to Bed - Progress: Progressing toward goal  Visit Information  Last PT Received On: 05/18/12 Assistance Needed: +2 PT/OT Co-Evaluation/Treatment: Yes    Subjective Data  Subjective: "I got too much fluid" Patient Stated Goal: to get her feet up when sitting in the chair   Cognition  Overall Cognitive Status: Appears within functional limits for tasks assessed/performed Arousal/Alertness: Awake/alert Orientation Level: Appears intact for tasks assessed Behavior During Session: [facility] for tasks performed Attention - Other Comments: pt able to attend to task respond to commands both verbally and motorically Following Commands: Follows  one step  commands consistently    Balance  Static Sitting Balance Static Sitting - Balance Support: No upper extremity supported Static Sitting - Level of Assistance: 5: Stand by assistance Static Standing Balance Static Standing - Balance Support: Bilateral upper extremity supported Static Standing - Level of Assistance: 5: Stand by assistance Static Standing - Comment/# of Minutes: 30 seconds x 2 to stand with RW with cues to do intermittent glute sets and trunk extension. Pt reports fatigue at end of session  End of Session PT - End of Session Equipment Utilized During Treatment: Oxygen Activity Tolerance: Patient limited by fatigue Patient left: in chair;with call bell/phone within reach   GP     Rosey Bath K. Manson Passey, [place] [phone] 05/18/2012, 10:09 AM

## 2012-05-19 ENCOUNTER — Inpatient Hospital Stay (HOSPITAL_COMMUNITY): Payer: Medicare Other

## 2012-05-19 DIAGNOSIS — J9600 Acute respiratory failure, unspecified whether with hypoxia or hypercapnia: Secondary | ICD-10-CM | POA: Diagnosis not present

## 2012-05-19 DIAGNOSIS — E119 Type 2 diabetes mellitus without complications: Secondary | ICD-10-CM | POA: Diagnosis not present

## 2012-05-19 DIAGNOSIS — I5031 Acute diastolic (congestive) heart failure: Secondary | ICD-10-CM | POA: Diagnosis not present

## 2012-05-19 DIAGNOSIS — J811 Chronic pulmonary edema: Secondary | ICD-10-CM | POA: Diagnosis not present

## 2012-05-19 DIAGNOSIS — I10 Essential (primary) hypertension: Secondary | ICD-10-CM | POA: Diagnosis not present

## 2012-05-19 LAB — GLUCOSE, CAPILLARY
Glucose-Capillary: 174 mg/dL — ABNORMAL HIGH (ref 70–99)
Glucose-Capillary: 145 mg/dL — ABNORMAL HIGH (ref 70–99)
Glucose-Capillary: 150 mg/dL — ABNORMAL HIGH (ref 70–99)

## 2012-05-19 MED ORDER — VITAMINS A & D EX OINT
TOPICAL_OINTMENT | CUTANEOUS | Status: AC
  Administered 2012-05-19: 14:00:00
  Filled 2012-05-19: qty 5

## 2012-05-19 MED ORDER — LORATADINE 10 MG PO TABS
10.0000 mg | ORAL_TABLET | Freq: Every day | ORAL | Status: DC
Start: 2012-05-19 — End: 2012-05-21
  Administered 2012-05-19 – 2012-05-21 (×3): 10 mg via ORAL
  Filled 2012-05-19 (×3): qty 1

## 2012-05-19 NOTE — Progress Notes (Signed)
05/19/12 1103  Patient received green flutter device. Educated patient on how to use the flutter. Ask patient to use flutter every hour while awake. Patient demonstrated how to use flutter and verbalized understanding. Two reps of five each done.

## 2012-05-19 NOTE — Progress Notes (Signed)
Per MD, Pt likely ready for d/c tomorrow.  Attempted to meet with Pt.  Pt predisposed.  Spoke with Pt's sister.  Mrs. Chase Picket is to tour [facility] today at 58.  Provided Pt's sister with bed offers.    CSW to speak with Pt's sister after her tour of [facility].  CSW thanked Mrs. Lineberry for her time.  Providence Crosby, LCSWA Clinical Social Work [phone]

## 2012-05-19 NOTE — Progress Notes (Signed)
[facility] PROGRESS NOTE  Charlene Detter ZOX:[id number] DOB: 29-Mar-1932 DOA: 113-May-202013 PCP: No primary provider on file.  Assessment/Plan: 1-Acute respiratory failure/ Acute diastolic heart failure, NYHA class 2/Hypercapnic Respiratory failure. She is a chronic retainer.  -Will continue QHS Bipap and to have sleeping study as an outpatient. -Daily supplemental oxygen with a goal of 88% O2 sat -Lasix changed to by mouth 40 mg twice a day (x-ray demonstrating improvement in vascular congestion and more aeration) . -Continue selective B-blocker (bystolic) -ECHO demonstrated EF 65% to 70% (diastolic CHF) -Avoid xanax due to lethargy and increase hypercapnia -Continue autopeep and will have sleep study as an outpatient. -OOB/PT to be continue while in the hospital; plan is for patient to go to [facility] for rehabilitation and further treatment. -Continue flutter valve -BMET in am  2-Diabetes:  Cont Holding lantus, metformin and glipizide due to hypoglycemic events inside the hosp.  Continue SSI.  3-HTN: Continue Holding Lisinopril due to renal insufficiency. Started on low dose bystolic. BP stable. Also receiving lasix.  4-Morbid obesity  - Counseled for low calorie diet.  5-Cough/Congestion: Patient moving more air and without crackles on examination. x-ray on 12/21 demonstrating improvement in vasc congestion and aeration . Continue Lasix PO 40 mg twice a day. Will also start   6-Renal Insufficiency/Mild uremia: Monitor on lasix. Continue holding lisinopril.  Creatinine 1.2. At this point will change her Lasix to by mouth. Continue monitoring patient's kidney function. BMET in am  7-Pleural mass: will repeat CT in 6 months. Further workup and treatment to be decided at that point depending on mass behavior.   8-physical deconditioning: Per physical therapy recommendations patient will require SNF. At this moment after discuss with social worker patient did no have any folder days  left for a skilled nursing facility admissions; she qualifies for LTAC and the plan is to ask [facility] to evaluate on patient's condition and needs for discharge plans. Case manager and social worker more help him at discharge.  9-sore throat: Most likely secondary to constant use of oxygen and also BiPAP at night. Will continue Cepacol as needed. No erythema or exudates appreciated on exam. Will also start claritin to control PND  Code Status: Full  Family Communication: no family at bedside Disposition Plan: Remain in step down.   Consultants:  Pulmonary/CCM. CM/social worker   Procedures:  None.    Antibiotics:  None.    HPI/Subjective: Patient is afebrile, no chest pain, reports significant improvement in her breathing. Overnight refused BiPAP. Complaining of sore throat.  Objective: Filed Vitals:   05/18/12 2246 05/19/12 0500 05/19/12 0540 05/19/12 0809  BP: 118/96  129/75   Pulse: 67  64   Temp: 98.3 F (36.8 C)  97.8 F (36.6 C)   TempSrc: Oral  Oral   Resp: 18  18   Height:      Weight:  128.5 kg (283 lb 4.7 oz)    SpO2: 96%  96% 95%    Intake/Output Summary (Last 24 hours) at 05/19/12 0951 Last data filed at 05/19/12 0600  Gross per 24 hour  Intake    965 ml  Output   2551 ml  Net  -1586 ml   Filed Weights   05/17/12 0600 05/18/12 0550 05/19/12 0500  Weight: 130.9 kg (288 lb 9.3 oz) 130.636 kg (288 lb) 128.5 kg (283 lb 4.7 oz)    Exam:   General:  No distress. AAOX3; following commands properly; afebrile and without any acute complaints  Cardiovascular: S1, S2 RRR.  Respiratory: No crackles, no wheezing, improved air movement, scattered rhonchi.   Abdomen: BS present, soft, NT.  Extremities; plus 2 edema.   Neuro: weak and deconditioned, no focal deficit.  Data Reviewed: Basic Metabolic Panel:  Lab 05/18/12 4098 05/16/12 0415 05/15/12 0353 05/14/12 0345 05/13/12 0906  NA 134* 142 141 138 139  K 3.7 4.1 3.7 3.8 4.0  CL 87* 93* 91* 88* 90*   CO2 38* 39* 43* 45* 43*  GLUCOSE 137* 125* 138* 107* 131*  BUN 45* 37* 30* 24* 24*  CREATININE 1.20* 1.17* 1.11* 0.92 0.96  CALCIUM 9.7 9.4 9.5 9.1 9.0  MG -- -- -- -- --  PHOS -- -- -- -- --   Liver Function Tests: No results found for this basename: AST:5,ALT:5,ALKPHOS:5,BILITOT:5,PROT:5,ALBUMIN:5 in the last 168 hours CBC:  Lab 05/16/12 0415 05/15/12 0353 05/14/12 2024 05/13/12 0906  WBC 8.6 8.4 8.4 8.5  NEUTROABS -- -- -- --  HGB 13.3 13.1 13.0 12.9  HCT 42.5 42.5 41.7 42.3  MCV 92.6 93.8 91.9 95.3  PLT 227 233 241 192   Cardiac Enzymes:  Lab 05/12/12 1019  CKTOTAL --  CKMB --  CKMBINDEX --  TROPONINI <0.30   BNP (last 3 results)  Basename 05/11/12 1603  PROBNP 2384.0*   CBG:  Lab 05/19/12 0741 05/18/12 1728 05/18/12 1139 05/18/12 0801 05/17/12 2154  GLUCAP 174* 139* 186* 147* 145*    Recent Results (from the past 240 hour(s))  URINE CULTURE     Status: Normal   Collection Time   05/11/12  6:29 PM      Component Value Range Status Comment   Specimen Description URINE, CLEAN CATCH   Final    Special Requests NONE   Final    Culture  Setup Time 05/12/2012 03:24   Final    Colony Count NO GROWTH   Final    Culture NO GROWTH   Final    Report Status 05/13/2012 FINAL   Final   MRSA PCR SCREENING     Status: Normal   Collection Time   05/13/12  6:13 PM      Component Value Range Status Comment   MRSA by PCR NEGATIVE  NEGATIVE Final      Studies: Dg Chest 1 View  05/19/2012  *RADIOLOGY REPORT*  Clinical Data: Shortness of breath, vascular congestion, follow-up  CHEST - 1 VIEW  Comparison: Portable chest x-ray of 05/17/2012  Findings: There has been some improvement in the degree of pulmonary vascular congestion.  Cardiomegaly remains.  Bibasilar linear atelectasis is noted.  IMPRESSION: Slight improvement in degree of pulmonary vascular congestion.   Original Report Authenticated By: Dwyane Dee, M.D.     Scheduled Meds:    . ipratropium  0.5 mg  Nebulization Q6H   And  . albuterol  2.5 mg Nebulization Q6H  . antiseptic oral rinse  15 mL Mouth Rinse BID  . aspirin EC  81 mg Oral q morning - 10a  . atorvastatin  10 mg Oral QHS  . cholecalciferol  1,000 Units Oral q morning - 10a  . docusate sodium  100 mg Oral QPM  . fluticasone  2 spray Each Nare Daily  . furosemide  40 mg Oral BID  . heparin  5,000 Units Subcutaneous Q8H  . insulin aspart  0-15 Units Subcutaneous TID WC  . insulin aspart  0-5 Units Subcutaneous QHS  . nebivolol  5 mg Oral Daily  . pantoprazole  40 mg Oral q morning - 10a  .  polyvinyl alcohol  2 drop Both Eyes BID  . sodium chloride  3 mL Intravenous Q12H    Time spent: < 30 minutes.    [facility]  [facility] Pager [phone]. If 8PM-8AM, please contact night-coverage at [email or web address], password [facility] 05/19/2012, 9:51 AM  LOS: 8 days

## 2012-05-19 NOTE — Progress Notes (Signed)
05/19/12 1006 Called respiratory and spoke to Annlisa in reference to obtaining a green flutter for patient. Per Annlisa she will bring flutter to patient.

## 2012-05-19 NOTE — Progress Notes (Addendum)
Per Pt's sister Mrs. Chase Picket, they would like for Pt to be d/c'd to [facility], when ready.  CSW thanked Mrs. Lineberry for her time.  Notified RNCM, Tresa Res, LCSWA Clinical Social Work [phone]

## 2012-05-20 DIAGNOSIS — E119 Type 2 diabetes mellitus without complications: Secondary | ICD-10-CM | POA: Diagnosis not present

## 2012-05-20 DIAGNOSIS — I5031 Acute diastolic (congestive) heart failure: Secondary | ICD-10-CM | POA: Diagnosis not present

## 2012-05-20 DIAGNOSIS — I509 Heart failure, unspecified: Secondary | ICD-10-CM | POA: Diagnosis not present

## 2012-05-20 DIAGNOSIS — J9600 Acute respiratory failure, unspecified whether with hypoxia or hypercapnia: Secondary | ICD-10-CM | POA: Diagnosis not present

## 2012-05-20 LAB — GLUCOSE, CAPILLARY
Glucose-Capillary: 151 mg/dL — ABNORMAL HIGH (ref 70–99)
Glucose-Capillary: 249 mg/dL — ABNORMAL HIGH (ref 70–99)

## 2012-05-20 LAB — BASIC METABOLIC PANEL
CO2: 38 mEq/L — ABNORMAL HIGH (ref 19–32)
Calcium: 9.7 mg/dL (ref 8.4–10.5)
Chloride: 91 mEq/L — ABNORMAL LOW (ref 96–112)
Glucose, Bld: 145 mg/dL — ABNORMAL HIGH (ref 70–99)
Potassium: 4.1 mEq/L (ref 3.5–5.1)
Sodium: 137 mEq/L (ref 135–145)

## 2012-05-20 MED ORDER — NEBIVOLOL HCL 5 MG PO TABS: 10.0000 mg | ORAL_TABLET | Freq: Every day | ORAL | Status: DC

## 2012-05-20 MED ORDER — FUROSEMIDE 40 MG PO TABS: 40.0000 mg | ORAL_TABLET | Freq: Two times a day (BID) | ORAL | Status: DC

## 2012-05-20 MED ORDER — METFORMIN HCL 500 MG PO TABS: 500.0000 mg | ORAL_TABLET | Freq: Two times a day (BID) | ORAL | Status: DC

## 2012-05-20 MED ORDER — TRAMADOL HCL 50 MG PO TABS: 50.0000 mg | ORAL_TABLET | Freq: Three times a day (TID) | ORAL | Status: DC | PRN

## 2012-05-20 MED ORDER — SERTRALINE HCL 50 MG PO TABS: 50.0000 mg | ORAL_TABLET | Freq: Every evening | ORAL | Status: DC | PRN

## 2012-05-20 MED ORDER — POLYETHYLENE GLYCOL 3350 17 G PO PACK: 17.0000 g | PACK | Freq: Every day | ORAL | Status: DC

## 2012-05-20 NOTE — Progress Notes (Signed)
Placed pt on autobipap for rest, minumum epap=5cm, max ipap=20cm h2o with 3l o2 bleedin.  Sterile water added to humidity chamber.  Pt is tolerating bipap well at this time.  HR57, sats94%.  RN notified.

## 2012-05-20 NOTE — Progress Notes (Signed)
T/c from Pt's sister re: [facility] placement.  Notified RNCM at [facility].  RNCM at [facility] to contact Pt's sister.  No further CSW needs identified.  CSW to sign off.  Providence Crosby, LCSWA Clinical Social Work [phone]

## 2012-05-20 NOTE — Discharge Summary (Signed)
Physician Discharge Summary  Sherise Geerdes ZOX:[id number] DOB: 10-Aug-1931 DOA: 12020/07/611  PCP: No primary provider on file.  Admit date: 12020/07/611 Discharge date: 05/20/2012  Time spent: >30 minutes  Recommendations for Outpatient Follow-up:  1. Reevaluate BP and renal function during follow up to restart lisinopril 2. Follow up with pulmonology for sleep studies 3. CT in 6 months to follow pleural mass 4. Rest of medical problems and medications to be adjusted by PCP.   Discharge Diagnoses:  Acute respiratory failure Acute diastolic heart failure, NYHA class 2 Diabetes mellitus OA (osteoarthritis) of ankle Hypertension Morbid obesity Pleural mass Physical deconditioning Anxiety/depression Renal failure  Discharge Condition: stable and improved; no complaints of CP, N/V, abdominal pain or any other complaints. Patient tolerating BIPAP and breathing more comfortable. [facility] for conditioning and [facility]. Sleep study to be arranged as an outpatient; follow up with pulmonology service and with PCP as an outpatient.  Diet recommendation: low carbohydrates, heart healthy low sodium diet  Filed Weights   05/17/12 0600 05/18/12 0550 05/19/12 0500  Weight: 130.9 kg (288 lb 9.3 oz) 130.636 kg (288 lb) 128.5 kg (283 lb 4.7 oz)    History of present illness:  78 y.o. female Past medical history of diabetes2, hypertension and morbid obesity that comes in for cough and generalized weakness. She relates this cough has been bothering her for months. She has progressively gotten worst over the past few months. She also reports episodes of paroxysmal nocturnal dyspnea, significant fatigue and tiredness with exertion, for the last 2 months. She also relates she has gained about 30 pounds. She denies any chest pain in the last 3 months, no palpitations, fever chills, diarrhea or vomiting.  Hospital Course:  1-Acute respiratory failure/ Acute diastolic heart failure, NYHA  class 2/Hypercapnic Respiratory failure. She is a chronic retainer.  -Will continue QHS Bipap and to have sleeping study as an outpatient.  -Daily supplemental oxygen (2-3L) with a goal of 88% O2 sat  -Will continue lasix 40mg  BID.  -Continue selective B-blocker (bystolic)  -ECHO demonstrated EF 65% to 70% (diastolic CHF)  -Avoid xanax and strong narcotics due to lethargy and increase hypercapnia  -Continue autopeep and will have sleep study as an outpatient.  -Continue flutter valve  -discharge to LTAC for physical rehabilitation and conditioning  2-Diabetes:  Resume metformin and glipizide. Advised to follow a low carb diet. A1c 7.7  3-HTN: Lisinopril will continue on hold until follow up with PCP due to renal insufficiency. Started on bystolic for BP control; also also receiving lasix.   4-Morbid obesity  - Counseled for low calorie diet. Marland Kitchen  5-Cough/Congestion: Patient moving more air and without crackles on examination. x-ray on 12/21 demonstrating improvement in vasc congestion and better aeration . Continue Lasix PO 40 mg twice a day. Will also continue using Flutter valve.   6-Renal Insufficiency/Mild uremia: Monitor on lasix. Continue holding lisinopril. Creatinine 1.10. Follow BMET in 1-2 weeks. GFR still demonstrating stage 2 renal failure.  7-Pleural mass: Plan is to repeat CT chest in 6 months. Further workup and treatment to be decided by pulmonologist at that point depending on mass behavior.   8-physical deconditioning: Per physical therapy recommendations patient will require SNF. At this moment after discuss with social worker patient did no have any further days left for a skilled nursing facility admissions; she qualifies for LTAC and the plan is to transfer her to [facility] for physical rehab and conditioning. Marland Kitchen  9- Sore throat: Most likely secondary to constant  use of oxygen, QHS BiPAP and also allergic rhinitis. Will continue Cepacol as needed. No erythema or exudates  appreciated on exam. Will also continue allegra.  10-anxiety/depression:will avoid benzodiazepines. Started on zoloft.  Rest of medical problems remains stable; plan is to continue current medication regimen.   Procedures: -2-D echo: with findings consistent with diastolic heart failure. No wall motion abnormalities and preserved EF. -See below for further imgaes studies and full report Consultations:  Pulmonary/CCM (Dr. Vassie Loll)  CM  Discharge Exam: Filed Vitals:   05/19/12 2037 05/20/12 0427 05/20/12 0900 05/20/12 1032  BP: 126/53 135/67  142/58  Pulse: 66 69  66  Temp: 97.8 F (36.6 C) 98.1 F (36.7 C)    TempSrc: Oral Axillary    Resp: 19 16    Height:      Weight:      SpO2: 95% 97% 95% 99%  General: No distress. AAOX3; following commands properly; afebrile and without any acute complaints  Cardiovascular: S1, S2 RRR.  Respiratory: No crackles, no wheezing, improved air movement, scattered rhonchi.  Abdomen: BS present, soft, NT.  Extremities; plus 2 edema.  Neuro: weak and deconditioned, no focal deficit.    Discharge Instructions  Discharge Orders    Future Appointments: Provider: Department: Dept Phone: Center:   06/19/2012 8:00 PM [facility] [phone] MSD     Future Orders Please Complete By Expires   Diet - low sodium heart healthy      Discharge instructions      Comments:   -Arrange sleep study as an outpatient (will be follow and read by Dr. Vassie Loll) -Take medications as prescribed -Follow a low calorie and low carb diet -Avoid benzodiazepines and strong narcotic also avoid flexeril       Medication List     As of 05/20/2012  3:44 PM    STOP taking these medications         ALPRAZolam 0.25 MG tablet   Commonly known as: XANAX      amLODipine 5 MG tablet   Commonly known as: NORVASC      busPIRone 15 MG tablet   Commonly known as: BUSPAR      cyclobenzaprine 10 MG tablet   Commonly  known as: FLEXERIL      lisinopril 20 MG tablet   Commonly known as: PRINIVIL,ZESTRIL      TAKE these medications         aspirin EC 81 MG tablet   Take 81 mg by mouth every morning.      atorvastatin 10 MG tablet   Commonly known as: LIPITOR   Take 10 mg by mouth at bedtime.      calcium carbonate 750 MG chewable tablet   Commonly known as: TUMS EX   Chew 1 tablet by mouth daily as needed. For heartburn and indigestion      cholecalciferol 1000 UNITS tablet   Commonly known as: VITAMIN D   Take 1,000 Units by mouth every morning.      diclofenac sodium 1 % Gel   Commonly known as: VOLTAREN   Apply 4 g topically 4 (four) times daily as needed. For joint pain      docusate sodium 100 MG capsule   Commonly known as: COLACE   Take 100 mg by mouth every evening.      fexofenadine 180 MG tablet   Commonly known as: ALLEGRA   Take 180 mg by mouth at bedtime.  fluticasone 50 MCG/ACT nasal spray   Commonly known as: FLONASE   Place 2 sprays into the nose daily.      furosemide 40 MG tablet   Commonly known as: LASIX   Take 1 tablet (40 mg total) by mouth 2 (two) times daily. Take 1 tablet by mouth daily. If a dose is missed take 2 tablets the following day      glipiZIDE 5 MG 24 hr tablet   Commonly known as: GLUCOTROL XL   Take 5 mg by mouth every morning.      ipratropium-albuterol 0.5-2.5 (3) MG/3ML Soln   Commonly known as: DUONEB   Take 3 mLs by nebulization once. Administered at PCP office      metFORMIN 500 MG tablet   Commonly known as: GLUCOPHAGE   Take 1 tablet (500 mg total) by mouth 2 (two) times daily with a meal.      nebivolol 5 MG tablet   Commonly known as: BYSTOLIC   Take 2 tablets (10 mg total) by mouth daily.      pantoprazole 40 MG tablet   Commonly known as: PROTONIX   Take 40 mg by mouth every morning.      polyethylene glycol packet   Commonly known as: MIRALAX / GLYCOLAX   Take 17 g by mouth daily.      sertraline 50 MG tablet    Commonly known as: ZOLOFT   Take 1 tablet (50 mg total) by mouth at bedtime and may repeat dose one time if needed.      SYSTANE PRESERVATIVE FREE 0.4-0.3 % Soln   Generic drug: Polyethyl Glycol-Propyl Glycol   Place 2 drops into both eyes 2 (two) times daily.      traMADol 50 MG tablet   Commonly known as: ULTRAM   Take 1-2 tablets (50-100 mg total) by mouth every 8 (eight) hours as needed for pain. Patient may use up to 2 tablets. Typically uses 1 tablet twice daily           Follow-up Information    Follow up with [facility]. On 06/19/2012. (8 pm)       Follow up with Hollice Espy, MD. (arrange follow up with PCP after discharge from facility)    Contact information:   [address] [place] [address] [phone]          The results of significant diagnostics from this hospitalization (including imaging, microbiology, ancillary and laboratory) are listed below for reference.    Significant Diagnostic Studies: Dg Chest 1 View  05/19/2012  *RADIOLOGY REPORT*  Clinical Data: Shortness of breath, vascular congestion, follow-up  CHEST - 1 VIEW  Comparison: Portable chest x-ray of 05/17/2012  Findings: There has been some improvement in the degree of pulmonary vascular congestion.  Cardiomegaly remains.  Bibasilar linear atelectasis is noted.  IMPRESSION: Slight improvement in degree of pulmonary vascular congestion.   Original Report Authenticated By: Dwyane Dee, M.D.    Dg Chest 1 View  05/17/2012  *RADIOLOGY REPORT*  Clinical Data: Follow up pulmonary status.  CHEST - 1 VIEW  Comparison: 106/13/2013.  Findings: The heart is borderline enlarged but stable.  The mediastinal and hilar contours are prominent but unchanged. Interval worsening lung aeration with interstitial thickening and peribronchial thickening likely pulmonary edema.  Probable small left effusion and bibasilar atelectasis.  IMPRESSION: Worsening lung aeration likely due to pulmonary edema.    Original Report Authenticated By: Rudie Meyer, M.D.    Dg Chest 2 View  12020/07/511  *RADIOLOGY REPORT*  Clinical Data: Shortness of breath  CHEST - 2 VIEW  Comparison: None.  Findings: Cardiomegaly.  Increased interstitial markings, possibly reflecting interstitial edema or less likely atypical infection. Mild left lower lobe opacity, likely atelectasis.  No pleural effusion or pneumothorax.  Degenerative changes of the visualized thoracolumbar spine.  IMPRESSION: Cardiomegaly with increased interstitial markings, possibly reflecting interstitial edema, less likely atypical infection.   Original Report Authenticated By: Charline Bills, M.D.    Ct Angio Chest Pe W/cm &/or Wo Cm  05/14/2012  *RADIOLOGY REPORT*  Clinical Data: Persistent hypoxemia with cough and shortness of breath.  Question pulmonary embolism.  History of hypertension and diabetes.  CT ANGIOGRAPHY CHEST  Technique:  Multidetector CT imaging of the chest using the standard protocol during bolus administration of intravenous contrast. Multiplanar reconstructed images including MIPs were obtained and reviewed to evaluate the vascular anatomy.  Contrast:  100 ml Omnipaque-350 intravenously.  Comparison: Chest radiographs 12020/07/511.  Findings: The pulmonary arteries are well opacified with contrast. There is no evidence of acute pulmonary embolism.  There is mild atherosclerosis of the great vessels, aorta and coronary arteries. No enlarged mediastinal or hilar lymph nodes are present.  There is chronic elevation of the right hemidiaphragm associated with right greater than left basilar atelectasis.  No consolidation or endobronchial lesion is seen.  There is trace pleural fluid on the left.  There is no significant right pleural effusion or pericardial effusion.  There is a calcified granuloma in the right upper lobe.  Images through the upper abdomen demonstrate a lenticular shaped soft tissue mass at the left costophrenic angle associated  with the left eighth costal space.  This measures 5.9 x 3.1 cm transverse and likely involves the left hemidiaphragm.  There is mild mass effect on the spleen.  No osseous destruction or erosion is identified.  No focal abnormality of the right hemidiaphragm is seen.  The right adrenal gland demonstrates mild low density prominence.  IMPRESSION:  1.  No evidence of acute pulmonary embolism. 2.  Right greater lobe basilar atelectasis associated with chronic elevation of the right hemidiaphragm. 3.  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  Appearance is not entirely specific, and additional considerations would include subacute hematoma and other mesenchymal tumors.  Followup imaging may be helpful to evaluate stability.   Original Report Authenticated By: Carey Bullocks, M.D.     Microbiology: Recent Results (from the past 240 hour(s))  URINE CULTURE     Status: Normal   Collection Time   05/11/12  6:29 PM      Component Value Range Status Comment   Specimen Description URINE, CLEAN CATCH   Final    Special Requests NONE   Final    Culture  Setup Time 05/12/2012 03:24   Final    Colony Count NO GROWTH   Final    Culture NO GROWTH   Final    Report Status 05/13/2012 FINAL   Final   MRSA PCR SCREENING     Status: Normal   Collection Time   05/13/12  6:13 PM      Component Value Range Status Comment   MRSA by PCR NEGATIVE  NEGATIVE Final      Labs: Basic Metabolic Panel:  Lab 05/20/12 1610 05/18/12 0335 05/16/12 0415 05/15/12 0353 05/14/12 0345  NA 137 134* 142 141 138  K 4.1 3.7 4.1 3.7 3.8  CL 91* 87* 93* 91*  88*  CO2 38* 38* 39* 43* 45*  GLUCOSE 145* 137* 125* 138* 107*  BUN 41* 45* 37* 30* 24*  CREATININE 1.10 1.20* 1.17* 1.11* 0.92  CALCIUM 9.7 9.7 9.4 9.5 9.1  MG -- -- -- -- --  PHOS -- -- -- -- --   CBC:  Lab 05/16/12 0415 05/15/12 0353 05/14/12 2024  WBC 8.6 8.4 8.4  NEUTROABS -- -- --  HGB 13.3  13.1 13.0  HCT 42.5 42.5 41.7  MCV 92.6 93.8 91.9  PLT 227 233 241   BNP: BNP (last 3 results)  Basename 05/11/12 1603  PROBNP 2384.0*   CBG:  Lab 05/20/12 1149 05/20/12 0712 05/19/12 2115 05/19/12 1652 05/19/12 1306  GLUCAP 161* 151* 150* 140* 145*     Signed:  Steadman Prosperi  [facility] 05/20/2012, 3:44 PM

## 2012-05-20 NOTE — Progress Notes (Signed)
NCM spoke to [facility] rep, Tessie Eke [phone]. Requires additional information faxed to [phone]. H&P, MAR, and progress notes to complete referral. Provided rep with Unit RN contact info to follow up. MD spoke to dtr, Yves Dill and scheduled d/c will be in the am. NCM contacted daughter and she is requesting Unit RN in am give her a call when pt is ready for d/c to [facility]. Isidoro Donning RN CCM Case Mgmt phone [phone]

## 2012-05-21 DIAGNOSIS — E119 Type 2 diabetes mellitus without complications: Secondary | ICD-10-CM | POA: Diagnosis not present

## 2012-05-21 DIAGNOSIS — Z79899 Other long term (current) drug therapy: Secondary | ICD-10-CM | POA: Diagnosis not present

## 2012-05-21 DIAGNOSIS — E1169 Type 2 diabetes mellitus with other specified complication: Secondary | ICD-10-CM | POA: Diagnosis not present

## 2012-05-21 DIAGNOSIS — J029 Acute pharyngitis, unspecified: Secondary | ICD-10-CM | POA: Diagnosis not present

## 2012-05-21 DIAGNOSIS — J45909 Unspecified asthma, uncomplicated: Secondary | ICD-10-CM | POA: Diagnosis not present

## 2012-05-21 DIAGNOSIS — N289 Disorder of kidney and ureter, unspecified: Secondary | ICD-10-CM | POA: Diagnosis not present

## 2012-05-21 DIAGNOSIS — G3184 Mild cognitive impairment, so stated: Secondary | ICD-10-CM | POA: Diagnosis not present

## 2012-05-21 DIAGNOSIS — R222 Localized swelling, mass and lump, trunk: Secondary | ICD-10-CM | POA: Diagnosis not present

## 2012-05-21 DIAGNOSIS — I498 Other specified cardiac arrhythmias: Secondary | ICD-10-CM | POA: Diagnosis not present

## 2012-05-21 DIAGNOSIS — R5381 Other malaise: Secondary | ICD-10-CM | POA: Diagnosis not present

## 2012-05-21 DIAGNOSIS — N189 Chronic kidney disease, unspecified: Secondary | ICD-10-CM | POA: Diagnosis not present

## 2012-05-21 DIAGNOSIS — F411 Generalized anxiety disorder: Secondary | ICD-10-CM | POA: Diagnosis not present

## 2012-05-21 DIAGNOSIS — J45901 Unspecified asthma with (acute) exacerbation: Secondary | ICD-10-CM | POA: Diagnosis not present

## 2012-05-21 DIAGNOSIS — F329 Major depressive disorder, single episode, unspecified: Secondary | ICD-10-CM | POA: Diagnosis not present

## 2012-05-21 DIAGNOSIS — I509 Heart failure, unspecified: Secondary | ICD-10-CM | POA: Diagnosis not present

## 2012-05-21 DIAGNOSIS — I5033 Acute on chronic diastolic (congestive) heart failure: Secondary | ICD-10-CM | POA: Diagnosis not present

## 2012-05-21 DIAGNOSIS — J9620 Acute and chronic respiratory failure, unspecified whether with hypoxia or hypercapnia: Secondary | ICD-10-CM | POA: Diagnosis not present

## 2012-05-21 DIAGNOSIS — G4733 Obstructive sleep apnea (adult) (pediatric): Secondary | ICD-10-CM | POA: Diagnosis not present

## 2012-05-21 DIAGNOSIS — Z5189 Encounter for other specified aftercare: Secondary | ICD-10-CM | POA: Diagnosis not present

## 2012-05-21 DIAGNOSIS — I279 Pulmonary heart disease, unspecified: Secondary | ICD-10-CM | POA: Diagnosis not present

## 2012-05-21 DIAGNOSIS — E662 Morbid (severe) obesity with alveolar hypoventilation: Secondary | ICD-10-CM | POA: Diagnosis not present

## 2012-05-21 DIAGNOSIS — N179 Acute kidney failure, unspecified: Secondary | ICD-10-CM | POA: Diagnosis not present

## 2012-05-21 DIAGNOSIS — I10 Essential (primary) hypertension: Secondary | ICD-10-CM | POA: Diagnosis not present

## 2012-05-21 DIAGNOSIS — I5031 Acute diastolic (congestive) heart failure: Secondary | ICD-10-CM | POA: Diagnosis not present

## 2012-05-21 DIAGNOSIS — N390 Urinary tract infection, site not specified: Secondary | ICD-10-CM | POA: Diagnosis not present

## 2012-05-21 DIAGNOSIS — Z6841 Body Mass Index (BMI) 40.0 and over, adult: Secondary | ICD-10-CM | POA: Diagnosis not present

## 2012-05-21 DIAGNOSIS — I129 Hypertensive chronic kidney disease with stage 1 through stage 4 chronic kidney disease, or unspecified chronic kidney disease: Secondary | ICD-10-CM | POA: Diagnosis not present

## 2012-05-21 DIAGNOSIS — M19079 Primary osteoarthritis, unspecified ankle and foot: Secondary | ICD-10-CM | POA: Diagnosis not present

## 2012-05-21 DIAGNOSIS — I5030 Unspecified diastolic (congestive) heart failure: Secondary | ICD-10-CM | POA: Diagnosis not present

## 2012-05-21 DIAGNOSIS — J9600 Acute respiratory failure, unspecified whether with hypoxia or hypercapnia: Secondary | ICD-10-CM | POA: Diagnosis not present

## 2012-05-21 LAB — GLUCOSE, CAPILLARY: Glucose-Capillary: 175 mg/dL — ABNORMAL HIGH (ref 70–99)

## 2012-05-21 NOTE — Progress Notes (Signed)
Called to pt's bedside by RN.  Pt voicing complaints with mask and wants bipap off.  Restore added to reddened areas on cheeks for pt comfort.  Pt was encouraged to try and wear the bipap as much/long as possible tonight.  Pt stated she would try.  RN notified.

## 2012-05-21 NOTE — Progress Notes (Signed)
Patient refused to have foley catheter out today before going to [facility] , wants to keep it in till she gets settled in [facility].Dr Sunnie Nielsen made aware. Report given to [facility] nurse,

## 2012-05-21 NOTE — Progress Notes (Signed)
CARE MANAGEMENT NOTE 05/21/2012  Patient:  Makayla Rodriguez, Makayla Rodriguez   Account Number:  [id number]  Date Initiated:  05/15/2012  Documentation initiated by:  DAVIS,RHONDA  Subjective/Objective Assessment:   pt with resp failure  transferred to sdu on [id number] due to hypoxia and increased lethgary, sluured speech. Placed on bipap     Action/Plan:   from home but may need snf   Anticipated DC Date:  05/21/2012   Anticipated DC Plan:  SKILLED NURSING FACILITY  In-house referral  Clinical Social Worker      DC Planning Services  CM consult      [facility] Choice  LONG TERM ACUTE CARE   Choice offered to / List presented to:  C-1 Patient   DME arranged  NA      DME agency  NA     HH arranged  NA      HH agency  NA   Status of service:  Completed, signed off Medicare Important Message given?  NA - LOS <3 / Initial given by admissions (If response is "NO", the following Medicare IM given date fields will be blank) Date Medicare IM given:   Date Additional Medicare IM given:    Discharge Disposition:  LONG TERM ACUTE CARE (LTAC)  Per UR Regulation:  Reviewed for med. necessity/level of care/duration of stay  If discussed at Long Length of Stay Meetings, dates discussed:    Comments:  12020/07/1411 Makayla Rodriguez BSN RN CCM [phone] CM spoke with [facility] _ltac admissions-Jeanntte; she advised that bed was available-rm 215; accepting MD-Dr Osei-Bonsu; have floor call report to [phone]. States she had d/c summary that was faxed yesterday; wants last notes for today if available and if any additions to d/c summary. Last progress notes from doctor faxed to [phone] with confirmation. Primary care nurse will call pt's family member- with information of transfr of patient. She will also call [facility] report. Charge nurse advised and will arrange for transfr per ambulance to facility. CM spoke with patient and answered questions. Voices understanding.

## 2012-05-21 NOTE — Progress Notes (Signed)
Patient seen and examined. No change in medical condition. No dyspnea. Lungs CTA.  Will discontinue foley catheter.  Arpita Fentress Regaldo, Md.

## 2012-06-12 DIAGNOSIS — Z5189 Encounter for other specified aftercare: Secondary | ICD-10-CM | POA: Diagnosis not present

## 2012-06-12 DIAGNOSIS — I509 Heart failure, unspecified: Secondary | ICD-10-CM | POA: Diagnosis not present

## 2012-06-12 DIAGNOSIS — I5030 Unspecified diastolic (congestive) heart failure: Secondary | ICD-10-CM | POA: Diagnosis not present

## 2012-06-12 DIAGNOSIS — R0602 Shortness of breath: Secondary | ICD-10-CM | POA: Diagnosis not present

## 2012-06-12 DIAGNOSIS — E662 Morbid (severe) obesity with alveolar hypoventilation: Secondary | ICD-10-CM | POA: Diagnosis not present

## 2012-06-12 DIAGNOSIS — G4733 Obstructive sleep apnea (adult) (pediatric): Secondary | ICD-10-CM | POA: Diagnosis not present

## 2012-06-12 DIAGNOSIS — I10 Essential (primary) hypertension: Secondary | ICD-10-CM | POA: Diagnosis not present

## 2012-06-12 DIAGNOSIS — E119 Type 2 diabetes mellitus without complications: Secondary | ICD-10-CM | POA: Diagnosis not present

## 2012-06-12 DIAGNOSIS — J9600 Acute respiratory failure, unspecified whether with hypoxia or hypercapnia: Secondary | ICD-10-CM | POA: Diagnosis not present

## 2012-06-12 DIAGNOSIS — I5031 Acute diastolic (congestive) heart failure: Secondary | ICD-10-CM | POA: Diagnosis not present

## 2012-06-12 DIAGNOSIS — F411 Generalized anxiety disorder: Secondary | ICD-10-CM | POA: Diagnosis not present

## 2012-06-12 DIAGNOSIS — N189 Chronic kidney disease, unspecified: Secondary | ICD-10-CM | POA: Diagnosis not present

## 2012-06-13 DIAGNOSIS — J9600 Acute respiratory failure, unspecified whether with hypoxia or hypercapnia: Secondary | ICD-10-CM | POA: Diagnosis not present

## 2012-06-13 DIAGNOSIS — I509 Heart failure, unspecified: Secondary | ICD-10-CM | POA: Diagnosis not present

## 2012-06-19 ENCOUNTER — Ambulatory Visit (HOSPITAL_BASED_OUTPATIENT_CLINIC_OR_DEPARTMENT_OTHER): Payer: Medicare Other

## 2012-07-02 DIAGNOSIS — F411 Generalized anxiety disorder: Secondary | ICD-10-CM | POA: Diagnosis not present

## 2012-07-02 DIAGNOSIS — I509 Heart failure, unspecified: Secondary | ICD-10-CM | POA: Diagnosis not present

## 2012-07-05 ENCOUNTER — Ambulatory Visit (HOSPITAL_BASED_OUTPATIENT_CLINIC_OR_DEPARTMENT_OTHER): Payer: Medicare Other | Attending: Pulmonary Disease

## 2012-07-05 VITALS — Ht 63.0 in | Wt 262.0 lb

## 2012-07-05 DIAGNOSIS — G4733 Obstructive sleep apnea (adult) (pediatric): Secondary | ICD-10-CM | POA: Insufficient documentation

## 2012-07-10 ENCOUNTER — Telehealth: Payer: Self-pay | Admitting: Pulmonary Disease

## 2012-07-10 DIAGNOSIS — G4733 Obstructive sleep apnea (adult) (pediatric): Secondary | ICD-10-CM

## 2012-07-10 NOTE — Procedures (Signed)
Makayla Rodriguez, SPEERS NO.:  [id number]  MEDICAL RECORD NO.:  [id number]          PATIENT TYPE:  OUT  LOCATION:  SLEEP CENTER                 FACILITY[facility]  [facility]  PHYSICIAN:  Coralyn Helling, MD        DATE OF BIRTH:  09-05-1931  DATE OF STUDY:  07/05/2012                           NOCTURNAL POLYSOMNOGRAM  REFERRING PHYSICIAN:  Oretha Milch, MD  FACILITY[facility]  [facility].  INDICATION FOR STUDY:  [name] is a 79 year old female who has a history of hypertension, congestive heart failure, and right hemidiaphragm elevation.  She also has snoring, sleep disruption, and daytime sleepiness.  She was referred to the Sleep Lab for evaluation of hypersomnia with obstructive sleep apnea.  Height is 5 feet and 3 inches, weight is 263 pounds.  BMI is 46, neck size is 16 inches.  EPWORTH SLEEPINESS SCORE:  4.  MEDICATIONS:  Medications are reviewed in the chart.  SLEEP ARCHITECTURE:  Total recording time was 349 minutes, total sleep time was 208 minutes, sleep efficiency was 60%.  Sleep latency was 29 minutes.  REM latency was 216 minutes.  The study was notable for lack of slow-wave sleep and a reduction in the percentage of REM sleep.  She slept exclusively in the supine position.  She had difficulty with sleep initiation and sleep maintenance due to respiratory events.  RESPIRATORY DATA:  The average respiratory rate was 16.  Loud snoring was noted by the technician.  The overall apnea-hypopnea index was 98.7. There were three mixed respiratory events.  The remainder of the events were obstructive in nature.  OXYGEN DATA:  The baseline oxygenation was 94%.  The oxygen saturation nadir was 58%.  The patient spent a total of 9.3 minutes with an oxygen saturation below 88%.  The study was conducted without the use of 2 liters of supplemental oxygen.  CARDIAC DATA:  The average heart rate was 64 and the rhythm strip showed sinus rhythm with occasional  PVCs.  MOVEMENT-PARASOMNIA:  The periodic limb movement index was 0 and the patient had 2 restroom trips.  IMPRESSIONS-RECOMMENDATIONS:  This study shows evidence for severe obstructive sleep apnea with an apnea-hypopnea index of 98.7 and oxygen saturation nadir of 58%.  In addition to diet, exercise, and weight reduction, the patient should return to the Sleep Lab for a CPAP titration study.     Coralyn Helling, MD Diplomat, American Board of Sleep Medicine    VS/MEDQ  D:  07/10/2012 12:28:04  T:  07/10/2012 22:19:55  Job:  [id number]

## 2012-07-10 NOTE — Telephone Encounter (Signed)
PSG 07/05/12 >> AHI 98.7, SpO2 low 58%.  Study done with 2 liters oxygen.  Will have my nurse inform pt that she has severe sleep apnea, and needs first available follow up appointment with Dr. Vassie Loll to review results and discuss therapy options.

## 2012-07-11 NOTE — Telephone Encounter (Signed)
Pt is scheduled for 07/23/12 at 1:30. Will forward to RA so he is aware

## 2012-07-12 NOTE — Telephone Encounter (Signed)
Have I seen this pt before?

## 2012-07-16 ENCOUNTER — Telehealth: Payer: Self-pay | Admitting: Pulmonary Disease

## 2012-07-16 NOTE — Telephone Encounter (Signed)
The pt's sister wants to know if the pt needs to have BiPap before she leaves the rehab center at [place]. The pt is wanting to come home but they are not sure if she needs this before then. RA, pls advise. Pt is scheduled for f/u on 07/23/12.

## 2012-07-17 NOTE — Telephone Encounter (Signed)
In the hospital back in December. thanks

## 2012-07-18 NOTE — Telephone Encounter (Signed)
Spoke with pt's sister and notified of recs per RA She verbalized understanding  Wants to talk with the pt and let her decide on titration study I advised to just remind the pt to keep her appt with RA for Monday 07/23/12 and she can discuss this issue with him then She verbalized understanding and states nothing further needed

## 2012-07-18 NOTE — Telephone Encounter (Signed)
Will need titration study to decide Can continue on the same mode she has been using until then - CPAP or bipap

## 2012-07-18 NOTE — Telephone Encounter (Signed)
ok 

## 2012-07-23 ENCOUNTER — Ambulatory Visit: Payer: Medicare Other | Admitting: Pulmonary Disease

## 2012-08-02 DIAGNOSIS — E662 Morbid (severe) obesity with alveolar hypoventilation: Secondary | ICD-10-CM | POA: Diagnosis not present

## 2012-08-02 DIAGNOSIS — R262 Difficulty in walking, not elsewhere classified: Secondary | ICD-10-CM | POA: Diagnosis not present

## 2012-08-02 DIAGNOSIS — I5031 Acute diastolic (congestive) heart failure: Secondary | ICD-10-CM | POA: Diagnosis not present

## 2012-08-02 DIAGNOSIS — I129 Hypertensive chronic kidney disease with stage 1 through stage 4 chronic kidney disease, or unspecified chronic kidney disease: Secondary | ICD-10-CM | POA: Diagnosis not present

## 2012-08-09 ENCOUNTER — Encounter: Payer: Self-pay | Admitting: Pulmonary Disease

## 2012-08-09 ENCOUNTER — Ambulatory Visit (INDEPENDENT_AMBULATORY_CARE_PROVIDER_SITE_OTHER): Payer: Medicare Other | Admitting: Pulmonary Disease

## 2012-08-09 VITALS — BP 118/76 | HR 73 | Temp 97.7°F | Ht 63.0 in | Wt 280.0 lb

## 2012-08-09 DIAGNOSIS — G4733 Obstructive sleep apnea (adult) (pediatric): Secondary | ICD-10-CM

## 2012-08-09 DIAGNOSIS — E662 Morbid (severe) obesity with alveolar hypoventilation: Secondary | ICD-10-CM | POA: Diagnosis not present

## 2012-08-09 NOTE — Assessment & Plan Note (Signed)
Stay on 24h O2 - rechk on next visit Ideally should be on bipap - if does not tolerate CPAP, will instate

## 2012-08-09 NOTE — Assessment & Plan Note (Signed)
Severe, AHI 98/h  Given excessive daytime somnolence, narrow pharyngeal exam, witnessed apneas & loud snoring, obstructive sleep apnea is very likely & an overnight polysomnogram will be scheduled as a split study. The pathophysiology of obstructive sleep apnea , it's cardiovascular consequences & modes of treatment including CPAP were discused with the patient in detail & they evidenced understanding.  Weight loss encouraged, compliance with goal of at least 4-6 hrs every night is the expectation. Advised against medications with sedative side effects Cautioned against driving when sleepy - understanding that sleepiness will vary on a day to day basis

## 2012-08-09 NOTE — Progress Notes (Signed)
  Subjective:    Patient ID: Makayla Rodriguez, female    DOB: 1932/01/18, 79 y.o.   MRN: [id number]  HPI 79 y.o MO(302 lbs) female with OSA/ OHS for FU.  She is severely limited in her ADL and cannot walk with a walker more than 10 feet. Admitted on 12-13 with mild resp acidosis & CXR s/o edema. She was aggressive diuresed -about 15L and supplemental O2. She has OHS by ABGs with a PH of 7.4 and PCO2 of 77 and bicarb of 47.4. She has remained O2 dependent. Ct of chest was negative for PE but did show rt diaphragm elevation and lt pleural mass.  Improved on Bipap, discharged  to [facility] , then rehab  - has been home x 1 wk  ACE-I was dc'd & bystolic started   PSG 07/05/12 >> AHI 98.7, SpO2 low 58%. Study done with 2 liters oxygen. - she was discharged home on 24 h O2   Review of Systems neg for any significant sore throat, dysphagia, itching, sneezing, nasal congestion or excess/ purulent secretions, fever, chills, sweats, unintended wt loss, pleuritic or exertional cp, hempoptysis, orthopnea pnd or change in chronic leg swelling. Also denies presyncope, palpitations, heartburn, abdominal pain, nausea, vomiting, diarrhea or change in bowel or urinary habits, dysuria,hematuria, rash, arthralgias, visual complaints, headache, numbness weakness or ataxia.     Objective:   Physical Exam  Gen. Pleasant, obese, in no distress, normal affect ENT - no lesions, no post nasal drip, class 2-3 airway Neck: No JVD, no thyromegaly, no carotid bruits Lungs: no use of accessory muscles, no dullness to percussion, decreased without rales or rhonchi  Cardiovascular: Rhythm regular, heart sounds  normal, no murmurs or gallops, no peripheral edema Abdomen: soft and non-tender, no hepatosplenomegaly, BS normal. Musculoskeletal: No deformities, no cyanosis or clubbing Neuro:  alert, non focal, no tremors       Assessment & Plan:

## 2012-08-09 NOTE — Patient Instructions (Addendum)
Use mucinex as needed only Allegra or claritin ok for allergies We wil send Rx for CPAP to [facility] - call us if you have not gotten this by next week Stay on oxygen

## 2012-08-10 DIAGNOSIS — I129 Hypertensive chronic kidney disease with stage 1 through stage 4 chronic kidney disease, or unspecified chronic kidney disease: Secondary | ICD-10-CM | POA: Diagnosis not present

## 2012-08-10 DIAGNOSIS — I5031 Acute diastolic (congestive) heart failure: Secondary | ICD-10-CM | POA: Diagnosis not present

## 2012-08-15 DIAGNOSIS — N950 Postmenopausal bleeding: Secondary | ICD-10-CM | POA: Diagnosis not present

## 2012-08-15 DIAGNOSIS — I10 Essential (primary) hypertension: Secondary | ICD-10-CM | POA: Diagnosis not present

## 2012-08-15 DIAGNOSIS — E7800 Pure hypercholesterolemia, unspecified: Secondary | ICD-10-CM | POA: Diagnosis not present

## 2012-08-15 DIAGNOSIS — R350 Frequency of micturition: Secondary | ICD-10-CM | POA: Diagnosis not present

## 2012-08-16 ENCOUNTER — Telehealth: Payer: Self-pay | Admitting: Pulmonary Disease

## 2012-08-16 ENCOUNTER — Other Ambulatory Visit: Payer: Self-pay | Admitting: Family Medicine

## 2012-08-16 DIAGNOSIS — G4733 Obstructive sleep apnea (adult) (pediatric): Secondary | ICD-10-CM

## 2012-08-16 NOTE — Telephone Encounter (Signed)
How many liters does pt need to use with her CPAP Dr. Vassie Loll thanks

## 2012-08-17 DIAGNOSIS — I5031 Acute diastolic (congestive) heart failure: Secondary | ICD-10-CM | POA: Diagnosis not present

## 2012-08-17 DIAGNOSIS — R262 Difficulty in walking, not elsewhere classified: Secondary | ICD-10-CM | POA: Diagnosis not present

## 2012-08-17 DIAGNOSIS — I129 Hypertensive chronic kidney disease with stage 1 through stage 4 chronic kidney disease, or unspecified chronic kidney disease: Secondary | ICD-10-CM | POA: Diagnosis not present

## 2012-08-17 DIAGNOSIS — E662 Morbid (severe) obesity with alveolar hypoventilation: Secondary | ICD-10-CM | POA: Diagnosis not present

## 2012-08-17 DIAGNOSIS — E119 Type 2 diabetes mellitus without complications: Secondary | ICD-10-CM | POA: Diagnosis not present

## 2012-08-17 NOTE — Telephone Encounter (Signed)
Y- 3L

## 2012-08-17 NOTE — Telephone Encounter (Signed)
Order has been sent to [facility]. Will sign off message.

## 2012-08-21 ENCOUNTER — Telehealth: Payer: Self-pay | Admitting: Pulmonary Disease

## 2012-08-21 DIAGNOSIS — G4733 Obstructive sleep apnea (adult) (pediatric): Secondary | ICD-10-CM

## 2012-08-21 NOTE — Telephone Encounter (Signed)
Ok to increase to 7-20 auto - pl [employer] in 4 wks , so we can adjust furhter

## 2012-08-21 NOTE — Telephone Encounter (Signed)
Spoke with Nadine Counts @ [facility]-- patient has starting pressure of 5. Is on auto 5-20. He states this is too low for patient. Requesting an order for patient to increase to Auto 6 or 7-20. Dr. Vassie Loll please advise, thank you  Last OV: 08/09/12 with 2 month f/u not scheduled

## 2012-08-22 ENCOUNTER — Ambulatory Visit
Admission: RE | Admit: 2012-08-22 | Discharge: 2012-08-22 | Disposition: A | Discharge status: Home / Self Care | Payer: Medicare Other | Source: Ambulatory Visit | Attending: Family Medicine | Admitting: Family Medicine

## 2012-08-22 DIAGNOSIS — D259 Leiomyoma of uterus, unspecified: Secondary | ICD-10-CM | POA: Diagnosis not present

## 2012-08-22 DIAGNOSIS — N950 Postmenopausal bleeding: Secondary | ICD-10-CM

## 2012-08-22 NOTE — Telephone Encounter (Signed)
Order has been sent to [facility].  

## 2012-08-23 DIAGNOSIS — E662 Morbid (severe) obesity with alveolar hypoventilation: Secondary | ICD-10-CM | POA: Diagnosis not present

## 2012-08-23 DIAGNOSIS — I129 Hypertensive chronic kidney disease with stage 1 through stage 4 chronic kidney disease, or unspecified chronic kidney disease: Secondary | ICD-10-CM | POA: Diagnosis not present

## 2012-08-23 DIAGNOSIS — E119 Type 2 diabetes mellitus without complications: Secondary | ICD-10-CM | POA: Diagnosis not present

## 2012-08-23 DIAGNOSIS — R262 Difficulty in walking, not elsewhere classified: Secondary | ICD-10-CM | POA: Diagnosis not present

## 2012-08-23 DIAGNOSIS — I5031 Acute diastolic (congestive) heart failure: Secondary | ICD-10-CM | POA: Diagnosis not present

## 2012-08-30 DIAGNOSIS — I129 Hypertensive chronic kidney disease with stage 1 through stage 4 chronic kidney disease, or unspecified chronic kidney disease: Secondary | ICD-10-CM | POA: Diagnosis not present

## 2012-08-30 DIAGNOSIS — R262 Difficulty in walking, not elsewhere classified: Secondary | ICD-10-CM | POA: Diagnosis not present

## 2012-08-30 DIAGNOSIS — E662 Morbid (severe) obesity with alveolar hypoventilation: Secondary | ICD-10-CM | POA: Diagnosis not present

## 2012-08-30 DIAGNOSIS — E119 Type 2 diabetes mellitus without complications: Secondary | ICD-10-CM | POA: Diagnosis not present

## 2012-08-30 DIAGNOSIS — I5031 Acute diastolic (congestive) heart failure: Secondary | ICD-10-CM | POA: Diagnosis not present

## 2012-09-06 DIAGNOSIS — R262 Difficulty in walking, not elsewhere classified: Secondary | ICD-10-CM | POA: Diagnosis not present

## 2012-09-06 DIAGNOSIS — E662 Morbid (severe) obesity with alveolar hypoventilation: Secondary | ICD-10-CM | POA: Diagnosis not present

## 2012-09-06 DIAGNOSIS — I5031 Acute diastolic (congestive) heart failure: Secondary | ICD-10-CM | POA: Diagnosis not present

## 2012-09-06 DIAGNOSIS — I129 Hypertensive chronic kidney disease with stage 1 through stage 4 chronic kidney disease, or unspecified chronic kidney disease: Secondary | ICD-10-CM | POA: Diagnosis not present

## 2012-09-06 DIAGNOSIS — E119 Type 2 diabetes mellitus without complications: Secondary | ICD-10-CM | POA: Diagnosis not present

## 2012-09-12 DIAGNOSIS — R262 Difficulty in walking, not elsewhere classified: Secondary | ICD-10-CM | POA: Diagnosis not present

## 2012-09-12 DIAGNOSIS — I129 Hypertensive chronic kidney disease with stage 1 through stage 4 chronic kidney disease, or unspecified chronic kidney disease: Secondary | ICD-10-CM | POA: Diagnosis not present

## 2012-09-12 DIAGNOSIS — I5031 Acute diastolic (congestive) heart failure: Secondary | ICD-10-CM | POA: Diagnosis not present

## 2012-09-12 DIAGNOSIS — E119 Type 2 diabetes mellitus without complications: Secondary | ICD-10-CM | POA: Diagnosis not present

## 2012-09-12 DIAGNOSIS — E662 Morbid (severe) obesity with alveolar hypoventilation: Secondary | ICD-10-CM | POA: Diagnosis not present

## 2012-09-19 DIAGNOSIS — E662 Morbid (severe) obesity with alveolar hypoventilation: Secondary | ICD-10-CM | POA: Diagnosis not present

## 2012-09-19 DIAGNOSIS — I5031 Acute diastolic (congestive) heart failure: Secondary | ICD-10-CM | POA: Diagnosis not present

## 2012-09-19 DIAGNOSIS — R262 Difficulty in walking, not elsewhere classified: Secondary | ICD-10-CM | POA: Diagnosis not present

## 2012-09-19 DIAGNOSIS — I129 Hypertensive chronic kidney disease with stage 1 through stage 4 chronic kidney disease, or unspecified chronic kidney disease: Secondary | ICD-10-CM | POA: Diagnosis not present

## 2012-09-19 DIAGNOSIS — E119 Type 2 diabetes mellitus without complications: Secondary | ICD-10-CM | POA: Diagnosis not present

## 2012-09-25 ENCOUNTER — Other Ambulatory Visit (HOSPITAL_COMMUNITY)
Admission: RE | Admit: 2012-09-25 | Discharge: 2012-09-25 | Disposition: A | Discharge status: Home / Self Care | Payer: Medicare Other | Source: Ambulatory Visit | Attending: Obstetrics and Gynecology | Admitting: Obstetrics and Gynecology

## 2012-09-25 DIAGNOSIS — Z01419 Encounter for gynecological examination (general) (routine) without abnormal findings: Secondary | ICD-10-CM | POA: Diagnosis not present

## 2012-09-25 DIAGNOSIS — Z1151 Encounter for screening for human papillomavirus (HPV): Secondary | ICD-10-CM | POA: Diagnosis not present

## 2012-09-25 DIAGNOSIS — D259 Leiomyoma of uterus, unspecified: Secondary | ICD-10-CM | POA: Diagnosis not present

## 2012-09-25 DIAGNOSIS — N841 Polyp of cervix uteri: Secondary | ICD-10-CM | POA: Diagnosis not present

## 2012-09-25 DIAGNOSIS — N950 Postmenopausal bleeding: Secondary | ICD-10-CM | POA: Diagnosis not present

## 2012-09-26 ENCOUNTER — Other Ambulatory Visit: Payer: Self-pay | Admitting: Obstetrics and Gynecology

## 2012-09-26 DIAGNOSIS — E662 Morbid (severe) obesity with alveolar hypoventilation: Secondary | ICD-10-CM | POA: Diagnosis not present

## 2012-09-26 DIAGNOSIS — R262 Difficulty in walking, not elsewhere classified: Secondary | ICD-10-CM | POA: Diagnosis not present

## 2012-09-26 DIAGNOSIS — E119 Type 2 diabetes mellitus without complications: Secondary | ICD-10-CM | POA: Diagnosis not present

## 2012-09-26 DIAGNOSIS — I129 Hypertensive chronic kidney disease with stage 1 through stage 4 chronic kidney disease, or unspecified chronic kidney disease: Secondary | ICD-10-CM | POA: Diagnosis not present

## 2012-09-26 DIAGNOSIS — I5031 Acute diastolic (congestive) heart failure: Secondary | ICD-10-CM | POA: Diagnosis not present

## 2012-10-05 DIAGNOSIS — M65979 Unspecified synovitis and tenosynovitis, unspecified ankle and foot: Secondary | ICD-10-CM | POA: Diagnosis not present

## 2012-10-05 DIAGNOSIS — M12279 Villonodular synovitis (pigmented), unspecified ankle and foot: Secondary | ICD-10-CM | POA: Diagnosis not present

## 2012-10-05 DIAGNOSIS — M79609 Pain in unspecified limb: Secondary | ICD-10-CM | POA: Diagnosis not present

## 2012-10-05 DIAGNOSIS — M659 Synovitis and tenosynovitis, unspecified: Secondary | ICD-10-CM | POA: Diagnosis not present

## 2012-10-07 ENCOUNTER — Telehealth: Payer: Self-pay | Admitting: Pulmonary Disease

## 2012-10-07 DIAGNOSIS — G4733 Obstructive sleep apnea (adult) (pediatric): Secondary | ICD-10-CM

## 2012-10-07 NOTE — Telephone Encounter (Signed)
Download 4/14 shows that she is not using her CPAP at all Can we help her? With non compliance, Her breathing will likely get worse again- also machine will be taken away Pl have them rpt download in 4 wks

## 2012-10-09 NOTE — Telephone Encounter (Signed)
Pl ask DME to get her different kind of mask -nasal - for claustrophobia

## 2012-10-09 NOTE — Telephone Encounter (Signed)
I spoke with pt and she stated she gets anxiety attacks and she feels like she is going to choke. She stated she is going to try using the machine. She stated she is claustrophobic. Order sent to get download in 4 weeks.

## 2012-10-09 NOTE — Addendum Note (Signed)
Addended by: Tommie Sams on: 10/09/2012 01:55 PM   Modules accepted: Orders

## 2012-10-09 NOTE — Telephone Encounter (Signed)
Order has been sent

## 2012-10-29 ENCOUNTER — Ambulatory Visit: Payer: Medicare Other | Admitting: Pulmonary Disease

## 2012-11-07 DIAGNOSIS — F411 Generalized anxiety disorder: Secondary | ICD-10-CM | POA: Diagnosis not present

## 2012-11-07 DIAGNOSIS — E1129 Type 2 diabetes mellitus with other diabetic kidney complication: Secondary | ICD-10-CM | POA: Diagnosis not present

## 2012-11-07 DIAGNOSIS — I10 Essential (primary) hypertension: Secondary | ICD-10-CM | POA: Diagnosis not present

## 2012-11-07 DIAGNOSIS — K219 Gastro-esophageal reflux disease without esophagitis: Secondary | ICD-10-CM | POA: Diagnosis not present

## 2012-11-07 DIAGNOSIS — R350 Frequency of micturition: Secondary | ICD-10-CM | POA: Diagnosis not present

## 2012-11-07 DIAGNOSIS — E7800 Pure hypercholesterolemia, unspecified: Secondary | ICD-10-CM | POA: Diagnosis not present

## 2012-11-13 DIAGNOSIS — N390 Urinary tract infection, site not specified: Secondary | ICD-10-CM | POA: Diagnosis not present

## 2012-11-14 DIAGNOSIS — H353190 Nonexudative age-related macular degeneration, unspecified eye, stage unspecified: Secondary | ICD-10-CM | POA: Diagnosis not present

## 2012-11-19 DIAGNOSIS — R350 Frequency of micturition: Secondary | ICD-10-CM | POA: Diagnosis not present

## 2012-11-19 DIAGNOSIS — K5900 Constipation, unspecified: Secondary | ICD-10-CM | POA: Diagnosis not present

## 2012-11-19 DIAGNOSIS — N390 Urinary tract infection, site not specified: Secondary | ICD-10-CM | POA: Diagnosis not present

## 2012-11-22 ENCOUNTER — Encounter: Payer: Self-pay | Admitting: Pulmonary Disease

## 2012-11-22 ENCOUNTER — Ambulatory Visit (INDEPENDENT_AMBULATORY_CARE_PROVIDER_SITE_OTHER): Payer: Medicare Other | Admitting: Pulmonary Disease

## 2012-11-22 VITALS — BP 116/76 | HR 62 | Temp 97.7°F | Ht 63.0 in | Wt 277.0 lb

## 2012-11-22 DIAGNOSIS — E662 Morbid (severe) obesity with alveolar hypoventilation: Secondary | ICD-10-CM

## 2012-11-22 DIAGNOSIS — G4733 Obstructive sleep apnea (adult) (pediatric): Secondary | ICD-10-CM

## 2012-11-22 DIAGNOSIS — R222 Localized swelling, mass and lump, trunk: Secondary | ICD-10-CM

## 2012-11-22 DIAGNOSIS — J948 Other specified pleural conditions: Secondary | ICD-10-CM

## 2012-11-22 NOTE — Patient Instructions (Addendum)
CPAP is very effective- change to 9cm & get another download in 4 wks OK to stay off oxygen for short periods - goal stan > 88%

## 2012-11-22 NOTE — Assessment & Plan Note (Signed)
CPAP is very effective- change to 9cm & get another download in 4 wks   Weight loss encouraged, compliance with goal of at least 4-6 hrs every night is the expectation. Advised against medications with sedative side effects Cautioned against driving when sleepy - understanding that sleepiness will vary on a day to day basis

## 2012-11-22 NOTE — Progress Notes (Signed)
  Subjective:    Patient ID: Makayla Rodriguez, female    DOB: March 31, 1932, 79 y.o.   MRN: [id number]  HPI  79 y.o MO(302 lbs) female with OSA/ OHS for FU.  She is severely limited in her ADL and cannot walk with a walker more than 10 feet. Admitted on 12-13 with mild resp acidosis & CXR s/o edema. She was aggressive diuresed -about 15L and supplemental O2. She has OHS by ABGs with a PH of 7.4 and PCO2 of 77 and bicarb of 47.4. She has remained O2 dependent. Ct of chest was negative for PE but did show rt diaphragm elevation and lt pleural mass.  Improved on Bipap, discharged to [facility] , then rehab - has been home x 1 wk  ACE-I was dc'd & bystolic started  PSG 07/05/12 >> AHI 98.7, SpO2 low 58%. Study done with 2 liters oxygen. - she was discharged home on 24 h O2  11/22/2012  Accompanied by aide , in wheelchair Download 4/14 showed that she is not using her CPAP at all she gets anxiety attacks and she feels like she is going to choke. She stated she is going to try using the machine. She stated she is claustrophobic. >> changed to nasal mask  Last 2 wks, Pt is wearing CPAP everynight w/ 3 L O2 at night. Pt states she is having no problems. she is feeling rested and has not had an anxiety attacks since being on CPAP.download 5/23- 11/07/12 on 7-20 auto  >> good compliance 6h, AHI 1/h, avg pr 9 cm  Much better with nasal mask, pressure ok, no dryness  Review of Systems neg for any significant sore throat, dysphagia, itching, sneezing, nasal congestion or excess/ purulent secretions, fever, chills, sweats, unintended wt loss, pleuritic or exertional cp, hempoptysis, orthopnea pnd or change in chronic leg swelling. Also denies presyncope, palpitations, heartburn, abdominal pain, nausea, vomiting, diarrhea or change in bowel or urinary habits, dysuria,hematuria, rash, arthralgias, visual complaints, headache, numbness weakness or ataxia.     Objective:   Physical Exam  Gen. Pleasant, obese, in no  distress ENT - no lesions, no post nasal drip Neck: No JVD, no thyromegaly, no carotid bruits Lungs: no use of accessory muscles, no dullness to percussion, decreased without rales or rhonchi  Cardiovascular: Rhythm regular, heart sounds  normal, no murmurs or gallops, no peripheral edema Musculoskeletal: No deformities, no cyanosis or clubbing , no tremors       Assessment & Plan:

## 2012-11-22 NOTE — Assessment & Plan Note (Signed)
OK to stay off oxygen for short periods - goal stan > 88%

## 2012-11-22 NOTE — Assessment & Plan Note (Signed)
RPt CT scan for stability

## 2012-11-27 ENCOUNTER — Ambulatory Visit (INDEPENDENT_AMBULATORY_CARE_PROVIDER_SITE_OTHER)
Admission: RE | Admit: 2012-11-27 | Discharge: 2012-11-27 | Disposition: A | Discharge status: Home / Self Care | Payer: Medicare Other | Source: Ambulatory Visit | Attending: Pulmonary Disease | Admitting: Pulmonary Disease

## 2012-11-27 ENCOUNTER — Telehealth: Payer: Self-pay | Admitting: Pulmonary Disease

## 2012-11-27 DIAGNOSIS — R222 Localized swelling, mass and lump, trunk: Secondary | ICD-10-CM

## 2012-11-27 DIAGNOSIS — R911 Solitary pulmonary nodule: Secondary | ICD-10-CM | POA: Diagnosis not present

## 2012-11-27 DIAGNOSIS — J948 Other specified pleural conditions: Secondary | ICD-10-CM

## 2012-11-27 NOTE — Telephone Encounter (Signed)
Form has been received and giving to RA. iwll fax back once done. Please advise Dr. Vassie Loll thanks

## 2012-11-27 NOTE — Telephone Encounter (Signed)
This has been faxed back. Nothing further was needed

## 2012-11-27 NOTE — Telephone Encounter (Signed)
done

## 2012-12-04 ENCOUNTER — Encounter: Payer: Self-pay | Admitting: *Deleted

## 2012-12-04 ENCOUNTER — Telehealth: Payer: Self-pay | Admitting: Pulmonary Disease

## 2012-12-04 NOTE — Telephone Encounter (Signed)
Ok for letter

## 2012-12-04 NOTE — Telephone Encounter (Signed)
Called, spoke with pt. She would like to go to the [facility] to work out.  They will not let her do this without a letter from RA saying it is safe because of her o2.  She would like letter to be mailed -- I have verified her home mailing address.  Dr. Vassie Loll, pls advise if you are ok with this letter.  Thank you.

## 2012-12-04 NOTE — Telephone Encounter (Signed)
Letter done and placed in mail to pt. Nothing further was needed

## 2013-01-29 ENCOUNTER — Other Ambulatory Visit (HOSPITAL_COMMUNITY): Payer: Self-pay | Admitting: Family Medicine

## 2013-01-29 DIAGNOSIS — Z1231 Encounter for screening mammogram for malignant neoplasm of breast: Secondary | ICD-10-CM

## 2013-02-06 ENCOUNTER — Ambulatory Visit (HOSPITAL_COMMUNITY)
Admission: RE | Admit: 2013-02-06 | Discharge: 2013-02-06 | Disposition: A | Discharge status: Home / Self Care | Payer: Medicare Other | Source: Ambulatory Visit | Attending: Family Medicine | Admitting: Family Medicine

## 2013-02-06 DIAGNOSIS — Z1231 Encounter for screening mammogram for malignant neoplasm of breast: Secondary | ICD-10-CM | POA: Diagnosis not present

## 2013-02-08 ENCOUNTER — Other Ambulatory Visit: Payer: Self-pay | Admitting: Family Medicine

## 2013-02-08 DIAGNOSIS — R928 Other abnormal and inconclusive findings on diagnostic imaging of breast: Secondary | ICD-10-CM

## 2013-02-12 DIAGNOSIS — N841 Polyp of cervix uteri: Secondary | ICD-10-CM | POA: Diagnosis not present

## 2013-02-12 DIAGNOSIS — N950 Postmenopausal bleeding: Secondary | ICD-10-CM | POA: Diagnosis not present

## 2013-02-22 ENCOUNTER — Encounter: Payer: Self-pay | Admitting: Adult Health

## 2013-02-22 ENCOUNTER — Ambulatory Visit (INDEPENDENT_AMBULATORY_CARE_PROVIDER_SITE_OTHER): Payer: Medicare Other | Admitting: Adult Health

## 2013-02-22 VITALS — BP 128/70 | HR 56 | Temp 97.3°F | Ht 63.5 in | Wt 289.6 lb

## 2013-02-22 DIAGNOSIS — G4733 Obstructive sleep apnea (adult) (pediatric): Secondary | ICD-10-CM

## 2013-02-22 DIAGNOSIS — R222 Localized swelling, mass and lump, trunk: Secondary | ICD-10-CM | POA: Diagnosis not present

## 2013-02-22 DIAGNOSIS — J948 Other specified pleural conditions: Secondary | ICD-10-CM

## 2013-02-22 NOTE — Assessment & Plan Note (Signed)
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 Liver cyst/ adrenal benign nodule - unchanged Small nodule in the lung - FU scan in 1 yr recommended  Plan  follow up CT chest ~12/22/13

## 2013-02-22 NOTE — Assessment & Plan Note (Signed)
CPAP download requested   Plan  Continue on CPAP At bedtime   Continue to work on Weight loss.  Follow up Dr. Vassie Loll  In 4 months and As needed

## 2013-02-22 NOTE — Patient Instructions (Addendum)
Continue on CPAP At bedtime   Continue to work on Weight loss.  Follow up Dr. Vassie Loll  In 4 months and As needed

## 2013-02-22 NOTE — Progress Notes (Signed)
  Subjective:    Patient ID: Makayla Rodriguez, female    DOB: [birth year], 79 y.o.   MRN: [id number]  HPI   79 y.o MO(302 lbs) female with OSA/ OHS for FU.  She is severely limited in her ADL and cannot walk with a walker more than 10 feet. Admitted on 12-13 with mild resp acidosis & CXR s/o edema. She was aggressive diuresed -about 15L and supplemental O2. She has OHS by ABGs with a PH of 7.4 and PCO2 of 77 and bicarb of 47.4. She has remained O2 dependent. Ct of chest was negative for PE but did show rt diaphragm elevation and lt pleural mass.  Improved on Bipap, discharged to [facility] , then rehab - has been home x 1 wk  ACE-I was dc'd & bystolic started  PSG 07/05/12 >> AHI 98.7, SpO2 low 58%. Study done with 2 liters oxygen. - she was discharged home on 24 h O2  11/22/12  Accompanied by aide , in wheelchair Download 4/14 showed that she is not using her CPAP at all she gets anxiety attacks and she feels like she is going to choke. She stated she is going to try using the machine. She stated she is claustrophobic. >> changed to nasal mask  Last 2 wks, Pt is wearing CPAP everynight w/ 3 L O2 at night. Pt states she is having no problems. she is feeling rested and has not had an anxiety attacks since being on CPAP.download 5/23- 11/07/12 on 7-20 auto  >> good compliance 6h, AHI 1/h, avg pr 9 cm  Much better with nasal mask, pressure ok, no dryness >no changes   02/22/2013 Follow up OSA  3 month follow up OSA - wearing CPAP every night from 12A-5:30A.   No trouble with mask. Unable to exercise d/t knee problems.  Wear CPAP all night , no trouble taking off.  Has aide with her today, has help at home 16hr /d .  No chest pain , orthopnea, increased edema, fever .  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 Liver cyst/ adrenal benign nodule - unchanged Small nodule in the lung - FU scan in 1 yr recommended    Review of  Systems  neg for any significant sore throat, dysphagia, itching, sneezing, nasal congestion or excess/ purulent secretions, fever, chills, sweats, unintended wt loss, pleuritic or exertional cp, hempoptysis, orthopnea pnd or change in chronic leg swelling. Also denies presyncope, palpitations, heartburn, abdominal pain, nausea, vomiting, diarrhea or change in bowel or urinary habits, dysuria,hematuria, rash, arthralgias, visual complaints, headache, numbness weakness or ataxia.     Objective:   Physical Exam   Gen. Pleasant, obese, in no distress ENT - no lesions, no post nasal drip Neck: No JVD, no thyromegaly, no carotid bruits Lungs: no use of accessory muscles, no dullness to percussion, decreased without rales or rhonchi  Cardiovascular: Rhythm regular, heart sounds  normal, no murmurs or gallops, no peripheral edema Musculoskeletal: No deformities, no cyanosis or clubbing , no tremors       Assessment & Plan:

## 2013-02-26 ENCOUNTER — Ambulatory Visit
Admission: RE | Admit: 2013-02-26 | Discharge: 2013-02-26 | Disposition: A | Discharge status: Home / Self Care | Payer: Medicare Other | Source: Ambulatory Visit | Attending: Family Medicine | Admitting: Family Medicine

## 2013-02-26 ENCOUNTER — Other Ambulatory Visit: Payer: Self-pay | Admitting: Family Medicine

## 2013-02-26 DIAGNOSIS — R928 Other abnormal and inconclusive findings on diagnostic imaging of breast: Secondary | ICD-10-CM

## 2013-02-26 DIAGNOSIS — N6320 Unspecified lump in the left breast, unspecified quadrant: Secondary | ICD-10-CM

## 2013-02-26 DIAGNOSIS — C50919 Malignant neoplasm of unspecified site of unspecified female breast: Secondary | ICD-10-CM | POA: Diagnosis not present

## 2013-02-26 DIAGNOSIS — N63 Unspecified lump in unspecified breast: Secondary | ICD-10-CM | POA: Diagnosis not present

## 2013-03-07 ENCOUNTER — Ambulatory Visit (INDEPENDENT_AMBULATORY_CARE_PROVIDER_SITE_OTHER): Payer: Medicare Other | Admitting: General Surgery

## 2013-03-07 ENCOUNTER — Encounter (INDEPENDENT_AMBULATORY_CARE_PROVIDER_SITE_OTHER): Payer: Self-pay | Admitting: General Surgery

## 2013-03-07 DIAGNOSIS — C50419 Malignant neoplasm of upper-outer quadrant of unspecified female breast: Secondary | ICD-10-CM | POA: Insufficient documentation

## 2013-03-07 DIAGNOSIS — C50412 Malignant neoplasm of upper-outer quadrant of left female breast: Secondary | ICD-10-CM

## 2013-03-07 NOTE — Patient Instructions (Signed)
You have been found to have a small, 1.4 cm invasive ductal carcinoma of the left breast, upper outer quadrant. This is sensitive to estrogen.  Dr. Derrell Lolling has advised a left partial mastectomy with needle localization. Dr. Derrell Lolling does not advised lymph node surgery.  We will ask Dr. Vassie Loll for medical clearance for the surgery. Be sure to bring your CPAP mask with you the day of surgery.  After you are recovering from this surgery, you'll be referred to a medical oncologist and the radiation oncologist.    Lumpectomy, Breast Conserving Surgery A lumpectomy is breast surgery that removes only part of the breast. Another name used may be partial mastectomy. The amount removed varies. Make sure you understand how much of your breast will be removed. Reasons for a lumpectomy:  Any solid breast mass.  Grouped significant nodularity that may be confused with a solitary breast mass. Lumpectomy is the most common form of breast cancer surgery today. The surgeon removes the portion of your breast which contains the tumor (cancer). This is the lump. Some normal tissue around the lump is also removed to be sure that all the tumor has been removed.  If cancer cells are found in the margins where the breast tissue was removed, your surgeon will do more surgery to remove the remaining cancer tissue. This is called re-excision surgery. Radiation and/or chemotherapy treatments are often given following a lumpectomy to kill any cancer cells that could possibly remain.  REASONS YOU MAY NOT BE ABLE TO HAVE BREAST CONSERVING SURGERY:  The tumor is located in more than one place.  Your breast is small and the tumor is large so the breast would be disfigured.  The entire tumor removal is not successful with a lumpectomy.  You cannot commit to a full course of chemotherapy, radiation therapy or are pregnant and cannot have radiation.  You have previously had radiation to the breast to treat cancer. HOW A  LUMPECTOMY IS PERFORMED If overnight nursing is not required following a biopsy, a lumpectomy can be performed as a same-day surgery. This can be done in a hospital, clinic, or surgical center. The anesthesia used will depend on your surgeon. They will discuss this with you. A general anesthetic keeps you sleeping through the procedure. LET YOUR CAREGIVERS KNOW ABOUT THE FOLLOWING:  Allergies  Medications taken including herbs, eye drops, over the counter medications, and creams.  Use of steroids (by mouth or creams)  Previous problems with anesthetics or Novocaine.  Possibility of pregnancy, if this applies  History of blood clots (thrombophlebitis)  History of bleeding or blood problems.  Previous surgery  Other health problems BEFORE THE PROCEDURE You should be present one hour prior to your procedure unless directed otherwise.  AFTER THE PROCEDURE  After surgery, you will be taken to the recovery area where a nurse will watch and check your progress. Once you're awake, stable, and taking fluids well, barring other problems you will be allowed to go home.  Ice packs applied to your operative site may help with discomfort and keep the swelling down.  A small rubber drain may be placed in the breast for a couple of days to prevent a hematoma from developing in the breast.  A pressure dressing may be applied for 24 to 48 hours to prevent bleeding.  Keep the wound dry.  You may resume a normal diet and activities as directed. Avoid strenuous activities affecting the arm on the side of the biopsy site such as tennis,  swimming, heavy lifting (more than 10 pounds) or pulling.  Bruising in the breast is normal following this procedure.  Wearing a bra - even to bed - may be more comfortable and also help keep the dressing on.  Change dressings as directed.  Only take over-the-counter or prescription medicines for pain, discomfort, or fever as directed by your caregiver. Call for  your results as instructed by your surgeon. Remember it is your responsibility to get the results of your lumpectomy if your surgeon asked you to follow-up. Do not assume everything is fine if you have not heard from your caregiver. SEEK MEDICAL CARE IF:   There is increased bleeding (more than a small spot) from the wound.  You notice redness, swelling, or increasing pain in the wound.  Pus is coming from wound.  An unexplained oral temperature above 102 F (38.9 C) develops.  You notice a foul smell coming from the wound or dressing. SEEK IMMEDIATE MEDICAL CARE IF:   You develop a rash.  You have difficulty breathing.  You have any allergic problems. Document Released: 06/27/2006 Document Revised: 08/08/2011 Document Reviewed: 09/28/2006 [facility] Patient Information 2014 [place], [place].

## 2013-03-07 NOTE — Progress Notes (Addendum)
Patient ID: Makayla Rodriguez, female   DOB: 1932-02-13, 79 y.o.   MRN: [id number]  Chief Complaint  Patient presents with  . Breast Problem    HPI Makayla Rodriguez is a 79 y.o. female.  She is referred by Dr. Cain Saupe for evaluation and management of a newly diagnosed invasive ductal carcinoma left breast, upper outer quadrant, 1.4 cm, ER 100%, PR  0, HER-2-negative. Her primary care physician is Dr. Shaune Pollack. Dr. Vassie Loll is her pulmonologist.   The patient has a history of fibrocystic disease and has had a couple of breast biopsies in the past but no history of breast cancer. Recent screening mammograms show a suspicious density in the left breast, upper outer quadrant, 1.4 cm. Image guided biopsy shows invasive ductal carcinoma, ER positive, 100%. PR 0. HER-2 negative.Ki-67 31%. She has not had an MRI. We presented her case in breast conference Wed.   The consensus was that it was probably better to do a partial mastectomy. No indication for node dissection or sentinel node biopsy, as that would not alter adjuvant therapies for outcome.. She might be a candidate for antiestrogen therapy, or radiation therapy, but probably not both.  She has significant comorbidities. She has oxygen-dependent sleep apnea, obesity hypoventilation syndrome, osteoarthritis, limited mobility and wheelchair dependent, non-insulin-dependent diabetes mellitus, hypertension, no history of myocardial infarction. Past history umbilical hernia repair and laparoscopic cholecystectomy.  She is here with her sister and a good friend. She is extremely pleasant.   Good insight. In a wheelchair. Using her oxygen  Intermittently.  FH negative for breast/ovarian cancer. HPI  Past Medical History  Diagnosis Date  . Diabetes mellitus without complication   . Hypertension   . Anxiety   . Arthritis   . Obesity   . CKD (chronic kidney disease), stage III   . COPD (chronic obstructive pulmonary disease)     Past Surgical  History  Procedure Laterality Date  . Hernia repair    . Tonsillectomy    . Cholecystectomy    .  fybroid tumors    . Breast surgery      Family History  Problem Relation Age of Onset  . Heart failure Mother   . Gallbladder disease Father     Social History History  Substance Use Topics  . Smoking status: Former Smoker -- 0.50 packs/day for 40 years    Types: Cigarettes    Quit date: 05/30/1980  . Smokeless tobacco: Never Used  . Alcohol Use: No    Allergies  Allergen Reactions  . Demerol [Meperidine] Anaphylaxis    Reaction: Hypoventilation after surgery  . Penicillins Anaphylaxis  . Phenergan [Promethazine Hcl] Anaphylaxis    Reaction: hypoventilation  . Codeine Nausea And Vomiting  . Macrobid [Nitrofurantoin] Nausea And Vomiting  . Relafen [Nabumetone] Itching and Swelling    Current Outpatient Prescriptions  Medication Sig Dispense Refill  . ALPRAZolam (XANAX) 0.5 MG tablet Take 0.5 mg by mouth 2 (two) times daily as needed for sleep.      Marland Kitchen aspirin EC 81 MG tablet Take 81 mg by mouth every morning.      Marland Kitchen atorvastatin (LIPITOR) 10 MG tablet Take 10 mg by mouth at bedtime.      . calcium carbonate (TUMS EX) 750 MG chewable tablet Chew 1 tablet by mouth daily as needed. For heartburn and indigestion      . cholecalciferol (VITAMIN D) 1000 UNITS tablet Take 1,000 Units by mouth every morning.      . Chromium-Cinnamon [phone]  MCG-MG CAPS Take 1 tablet by mouth daily.      . cyclobenzaprine (FLEXERIL) 5 MG tablet Take 5 mg by mouth 3 (three) times daily as needed for muscle spasms.      . diclofenac sodium (VOLTAREN) 1 % GEL Apply 4 g topically 4 (four) times daily as needed. For joint pain      . Dietary Management Product (TOZAL PO) Take 3 tablets by mouth daily.      Marland Kitchen docusate sodium (COLACE) 100 MG capsule Take 100 mg by mouth every evening.      . fexofenadine (ALLEGRA) 180 MG tablet Take 180 mg by mouth at bedtime.      . fluticasone (FLONASE) 50 MCG/ACT  nasal spray Place 2 sprays into the nose daily.      Marland Kitchen glipiZIDE (GLUCOTROL XL) 5 MG 24 hr tablet 1/2 tab by mouth once daily      . guaiFENesin (MUCINEX) 600 MG 12 hr tablet Take 1,200 mg by mouth as needed.       Marland Kitchen lisinopril (PRINIVIL,ZESTRIL) 5 MG tablet Take 5 mg by mouth daily.      . metFORMIN (GLUCOPHAGE) 500 MG tablet Take 500 mg by mouth daily.      . nebivolol (BYSTOLIC) 5 MG tablet Take 5 mg by mouth daily.      . pantoprazole (PROTONIX) 40 MG tablet Take 40 mg by mouth every morning.      . torsemide (DEMADEX) 20 MG tablet Take 20 mg by mouth daily.      . traMADol (ULTRAM) 50 MG tablet 1-2 tablets by mouth 2-3 times daily       No current facility-administered medications for this visit.    Review of Systems Review of Systems  Constitutional: Negative for fever, chills and unexpected weight change.  HENT: Negative for congestion, hearing loss, sore throat, trouble swallowing and voice change.   Eyes: Negative for visual disturbance.  Respiratory: Positive for apnea and shortness of breath. Negative for cough, wheezing and stridor.   Cardiovascular: Negative for chest pain, palpitations and leg swelling.  Gastrointestinal: Negative for nausea, vomiting, abdominal pain, diarrhea, constipation, blood in stool, abdominal distention and anal bleeding.  Genitourinary: Negative for hematuria, vaginal bleeding and difficulty urinating.  Musculoskeletal: Positive for arthralgias, back pain, gait problem and myalgias.  Skin: Negative for rash and wound.  Neurological: Negative for seizures, syncope and headaches.  Hematological: Negative for adenopathy. Does not bruise/bleed easily.  Psychiatric/Behavioral: Negative for confusion.    There were no vitals taken for this visit.  Physical Exam Physical Exam  Constitutional: She is oriented to person, place, and time. She appears well-developed and well-nourished. No distress.  BMI 49.08. Alert. Mental status normal. Morbidly obese.  In a wheelchair.  HENT:  Head: Normocephalic and atraumatic.  Nose: Nose normal.  Mouth/Throat: No oropharyngeal exudate.  Eyes: Conjunctivae and EOM are normal. Pupils are equal, round, and reactive to light. Left eye exhibits no discharge. No scleral icterus.  Neck: Neck supple. No JVD present. No tracheal deviation present. No thyromegaly present.  Cardiovascular: Normal rate, regular rhythm, normal heart sounds and intact distal pulses.   No murmur heard. Pulmonary/Chest: Effort normal and breath sounds normal. No respiratory distress. She has no wheezes. She has no rales. She exhibits no tenderness.  Both breasts are examined. They are large. There are pendulous. Biopsy site left breast upper outer quadrant looks okay. No palpable mass. No axillary adenopathy.  Abdominal: Soft. Bowel sounds are normal. She exhibits no distension  and no mass. There is no tenderness. There is no rebound and no guarding.  Musculoskeletal: She exhibits no edema and no tenderness.  Lymphadenopathy:    She has no cervical adenopathy.  Neurological: She is alert and oriented to person, place, and time. She exhibits normal muscle tone. Coordination normal.  Skin: Skin is warm. No rash noted. She is not diaphoretic. No erythema. No pallor.  Psychiatric: She has a normal mood and affect. Her behavior is normal. Judgment and thought content normal.    Data Reviewed Imaging studies. Histology. Breast diagnostic profile. Dr. Reginia Naas office notes.  Assessment    Invasive ductal carcinoma left breast, upper outer quadrant, 1.4 cm. ER 100%, PR 0. HER-2 negative.   Ki-67 31%. Clinical stage T1c,., N0 (stage I)  Oxygen dependent pulmonary disease  Sleep apnea on CPAP  Remote history of fibrocystic breast disease  Morbid obesity  Limited  mobility and wheelchair dependent  Non insulin dependent diabetes mellitus  History of umbilical hernia repair and laparoscopic cholecystectomy, Dr. Orpah Greek    Plan     Long discussion. Discussed NCCN  guidelines. Discussed her individual situation. Discussed recommendations from breast conference  She'll be scheduled for left partial mastectomy with needle localization  We will ask for medical clearance from her pulmonologist, Dr. Vassie Loll  I discussed the indications, details, techniques, numerous risk of the surgery with her and her family. She is aware of the risk of bleeding, infection, reoperation for margin control, cardiac and pulmonary complications. She understands all these issues. All of her questions are answered. She agrees with this plan.       Angelia Mould. Derrell Lolling, M.D., [facility] General and Minimally invasive Surgery Breast and Colorectal Surgery Office:   [phone] Pager:   [phone]  03/07/2013, 1:58 PM

## 2013-03-10 ENCOUNTER — Telehealth: Payer: Self-pay | Admitting: Pulmonary Disease

## 2013-03-10 NOTE — Telephone Encounter (Signed)
Responding to request for pulmonary clearance for left partial mastectomy She has OSA/ OHS Recommend - Ok to proceed Maintain on CPAP 10 cm with 3L O2 blended Judicious use of pain meds - high risk for resp depression Anesthesia precautions for OSA

## 2013-03-13 ENCOUNTER — Telehealth: Payer: Self-pay | Admitting: Pulmonary Disease

## 2013-03-13 NOTE — Telephone Encounter (Signed)
I called and spoke with pt. She reports she was dx with breast cancer last week. She reports Dr. Marlise Eves is suppose to call and speak with [place]. She will need to have surgery. She knows she will need anesthesiology and just wants to make sure Dr. Vassie Loll will agree with who Dr. Marlise Eves calls [place]. She stated this is FYI for [place] for when he calls. I advised will forward to Dr. Vassie Loll. Pt states if Dr. Vassie Loll wants to speak with her then just to call her at the # listed. Will forward to RA

## 2013-03-13 NOTE — Telephone Encounter (Signed)
I called and spoke with pt. Made her aware of the below. Nothing further needed

## 2013-03-13 NOTE — Telephone Encounter (Signed)
Have sent clearance to Dr Derrell Lolling

## 2013-03-14 DIAGNOSIS — I10 Essential (primary) hypertension: Secondary | ICD-10-CM | POA: Diagnosis not present

## 2013-03-14 DIAGNOSIS — F411 Generalized anxiety disorder: Secondary | ICD-10-CM | POA: Diagnosis not present

## 2013-03-14 DIAGNOSIS — E1129 Type 2 diabetes mellitus with other diabetic kidney complication: Secondary | ICD-10-CM | POA: Diagnosis not present

## 2013-03-14 DIAGNOSIS — M159 Polyosteoarthritis, unspecified: Secondary | ICD-10-CM | POA: Diagnosis not present

## 2013-03-14 DIAGNOSIS — K219 Gastro-esophageal reflux disease without esophagitis: Secondary | ICD-10-CM | POA: Diagnosis not present

## 2013-03-14 DIAGNOSIS — M1710 Unilateral primary osteoarthritis, unspecified knee: Secondary | ICD-10-CM | POA: Diagnosis not present

## 2013-03-14 DIAGNOSIS — Z23 Encounter for immunization: Secondary | ICD-10-CM | POA: Diagnosis not present

## 2013-03-21 NOTE — Telephone Encounter (Signed)
Patient called today checking on scheduling surgery.  I told her I had not received anything from Dr Vassie Loll but I read a note in her chart that he sent to Dr Derrell Lolling.  She is clear for surgery.  I sent her posting information to [place] in scheduling.  She will call her asap.

## 2013-03-22 ENCOUNTER — Other Ambulatory Visit (INDEPENDENT_AMBULATORY_CARE_PROVIDER_SITE_OTHER): Payer: Self-pay | Admitting: General Surgery

## 2013-03-22 DIAGNOSIS — C50912 Malignant neoplasm of unspecified site of left female breast: Secondary | ICD-10-CM

## 2013-03-27 ENCOUNTER — Encounter (HOSPITAL_COMMUNITY): Payer: Self-pay | Admitting: Pharmacy Technician

## 2013-04-01 ENCOUNTER — Other Ambulatory Visit (HOSPITAL_COMMUNITY): Payer: Self-pay | Admitting: *Deleted

## 2013-04-01 NOTE — Pre-Procedure Instructions (Signed)
Makayla Rodriguez  04/01/2013   Your procedure is scheduled on:  Friday, April 05, 2013 at 12:15 PM.   Report to [facility] Entr[facility] "A" at 10:15 AM.   Call this number if you have problems the morning of surgery: [phone]   Remember:   Do not eat food or drink liquids after midnight Thursday, 04/04/13.   Take these medicines the morning of surgery with A SIP OF WATER: nebivolol (BYSTOLIC), pantoprazole (PROTONIX), traMADol (ULTRAM), fluticasone (FLONASE),  cyclobenzaprine (FLEXERIL) - if needed.  Stop all Vitamins, Herbal Medications, and Aspirin as of today, 04/02/13.                Do not wear jewelry, make-up or nail polish.  Do not wear lotions, powders, or perfumes. You may wear deodorant.  Do not shave 48 hours prior to surgery.   Do not bring valuables to the hospital.  [facility] is not responsible                  for any belongings or valuables.               Contacts, dentures or bridgework may not be worn into surgery.  Leave suitcase in the car. After surgery it may be brought to your room.  For patients admitted to the hospital, discharge time is determined by your                treatment team.               Patients discharged the day of surgery will not be allowed to drive  home.  Name and phone number of your driver: Family/friend   Special Instructions: Shower using CHG 2 nights before surgery and the night before surgery.  If you shower the day of surgery use CHG.  Use special wash - you have one bottle of CHG for all showers.  You should use approximately 1/3 of the bottle for each shower.   Please read over the following fact sheets that you were given: Pain Booklet, Coughing and Deep Breathing and Surgical Site Infection Prevention

## 2013-04-02 ENCOUNTER — Encounter (HOSPITAL_COMMUNITY): Payer: Self-pay

## 2013-04-02 ENCOUNTER — Encounter (HOSPITAL_COMMUNITY)
Admission: RE | Admit: 2013-04-02 | Discharge: 2013-04-02 | Disposition: A | Discharge status: Home / Self Care | Payer: Medicare Other | Source: Ambulatory Visit | Attending: General Surgery | Admitting: General Surgery

## 2013-04-02 DIAGNOSIS — I129 Hypertensive chronic kidney disease with stage 1 through stage 4 chronic kidney disease, or unspecified chronic kidney disease: Secondary | ICD-10-CM | POA: Diagnosis not present

## 2013-04-02 DIAGNOSIS — Z01812 Encounter for preprocedural laboratory examination: Secondary | ICD-10-CM | POA: Diagnosis not present

## 2013-04-02 DIAGNOSIS — Z170 Estrogen receptor positive status [ER+]: Secondary | ICD-10-CM | POA: Diagnosis not present

## 2013-04-02 DIAGNOSIS — G4730 Sleep apnea, unspecified: Secondary | ICD-10-CM | POA: Diagnosis not present

## 2013-04-02 DIAGNOSIS — Z6841 Body Mass Index (BMI) 40.0 and over, adult: Secondary | ICD-10-CM | POA: Diagnosis not present

## 2013-04-02 DIAGNOSIS — E662 Morbid (severe) obesity with alveolar hypoventilation: Secondary | ICD-10-CM | POA: Diagnosis not present

## 2013-04-02 DIAGNOSIS — F411 Generalized anxiety disorder: Secondary | ICD-10-CM | POA: Diagnosis not present

## 2013-04-02 DIAGNOSIS — M1990 Unspecified osteoarthritis, unspecified site: Secondary | ICD-10-CM | POA: Diagnosis not present

## 2013-04-02 DIAGNOSIS — Z01818 Encounter for other preprocedural examination: Secondary | ICD-10-CM | POA: Diagnosis not present

## 2013-04-02 DIAGNOSIS — Z9981 Dependence on supplemental oxygen: Secondary | ICD-10-CM | POA: Diagnosis not present

## 2013-04-02 DIAGNOSIS — C50419 Malignant neoplasm of upper-outer quadrant of unspecified female breast: Secondary | ICD-10-CM | POA: Diagnosis not present

## 2013-04-02 DIAGNOSIS — J449 Chronic obstructive pulmonary disease, unspecified: Secondary | ICD-10-CM | POA: Diagnosis not present

## 2013-04-02 DIAGNOSIS — E119 Type 2 diabetes mellitus without complications: Secondary | ICD-10-CM | POA: Diagnosis not present

## 2013-04-02 DIAGNOSIS — I10 Essential (primary) hypertension: Secondary | ICD-10-CM | POA: Diagnosis not present

## 2013-04-02 DIAGNOSIS — Z993 Dependence on wheelchair: Secondary | ICD-10-CM | POA: Diagnosis not present

## 2013-04-02 HISTORY — DX: Shortness of breath: R06.02

## 2013-04-02 HISTORY — DX: Other complications of anesthesia, initial encounter: T88.59XA

## 2013-04-02 HISTORY — DX: Heart failure, unspecified: I50.9

## 2013-04-02 HISTORY — DX: Adverse effect of unspecified anesthetic, initial encounter: T41.45XA

## 2013-04-02 HISTORY — DX: Sleep apnea, unspecified: G47.30

## 2013-04-02 HISTORY — DX: Other seasonal allergic rhinitis: J30.2

## 2013-04-02 HISTORY — DX: Gastro-esophageal reflux disease without esophagitis: K21.9

## 2013-04-02 LAB — COMPREHENSIVE METABOLIC PANEL
AST: 14 U/L (ref 0–37)
Albumin: 4 g/dL (ref 3.5–5.2)
Alkaline Phosphatase: 74 U/L (ref 39–117)
CO2: 32 mEq/L (ref 19–32)
Chloride: 97 mEq/L (ref 96–112)
Creatinine, Ser: 0.85 mg/dL (ref 0.50–1.10)
GFR calc non Af Amer: 63 mL/min — ABNORMAL LOW (ref >90)
Glucose, Bld: 196 mg/dL — ABNORMAL HIGH (ref 70–99)
Total Bilirubin: 0.4 mg/dL (ref 0.3–1.2)

## 2013-04-02 LAB — CBC WITH DIFFERENTIAL/PLATELET
Basophils Absolute: 0 10*3/uL (ref 0.0–0.1)
HCT: 39 % (ref 36.0–46.0)
Hemoglobin: 12.5 g/dL (ref 12.0–15.0)
Lymphocytes Relative: 27 % (ref 12–46)
Lymphs Abs: 2.3 10*3/uL (ref 0.7–4.0)
Monocytes Absolute: 0.6 10*3/uL (ref 0.1–1.0)
Monocytes Relative: 7 % (ref 3–12)
Neutro Abs: 5.2 10*3/uL (ref 1.7–7.7)
RBC: 4.27 MIL/uL (ref 3.87–5.11)
RDW: 12.3 % (ref 11.5–15.5)
WBC: 8.3 10*3/uL (ref 4.0–10.5)

## 2013-04-02 NOTE — Progress Notes (Signed)
Anesthesia PAT Evaluation: Patient is a 79 year old female scheduled for left partial mastectomy with needle localization on 04/05/13 by Dr. Derrell Lolling.  Procedure is posted for [place]. He had patient cleared by pulmonologist Dr. Vassie Loll.  History includes breast cancer, former smoker, morbid obeisty, OSA/obesity hypoventilation syndrome (uses CPAP with 3L/[place]), COPD with intermittent daytime home O2 of 2L/[place], HTN, DM2, GERD, anxiety, CKD stage III, CHF, tonsillectomy as a child, cholecystectomy, hernia repair, pilonidal cyst excision as a teen ager, fibroid tumor excision, cataract extraction, pleural based and RLL lung nodule with plans for follow up ~ 11/2013. Her last hospitalization for acute respiratory failure and acute diastolic CHF was in 04/2012.  For anesthesia history she reports oxygen desaturation with subsequent "coma"/anoxic brain injury in 12/1 following cholecystectomy.  This was prior to her OSA/CPAP diagnosis.  I reviewed discharge summary that reported these symptoms developed after receiving IV pain medication on the night of surgery.  She responded to "airway and oxygen therapy."  Pulmonologist is Dr. Vassie Loll who cleared patient for this procedure with recommendations for OSA precautions. PCP is Dr. Shaune Pollack.  EKG on 05/11/12 showed SR with first degree AVB.  Echo on 05/12/12 showed: - Left ventricle: The cavity size was normal. There was mild concentric hypertrophy, with focal basal septal hypertrophy of severe degree. Mild LVOT gradient. Systolic function was vigorous. The estimated ejection fraction was in the range of 65% to 70%. Although no diagnostic regional wall motion abnormality was identified, this possibility cannot be completely excluded on the basis of this study. - Ventricular septum: The contour showed diastolic flattening. - Aortic valve: Valve area: 3.81cm^2(VTI). Valve area: 2.79cm^2 (Vmax). - Mitral valve: Moderately to severely calcified annulus. Mildly thickened leaflets  . Mild regurgitation. - Left atrium: The atrium was mildly dilated. - Right ventricle: The cavity size was mildly dilated. Wall thickness was normal. - Right atrium: The atrium was mildly dilated. - Pulmonary arteries: Systolic pressure was moderately increased. PA peak pressure: 59mm Hg (S).  CXR on 04/02/13 showed: 1. No acute cardiopulmonary process.  2. 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  Preoperative labs noted.  Patient feels at her baseline.  She denies chest pain, SOB at rest.  She is only able to walk very short distances with her walker but denies any significant DOE when walking room to room.  She only feels SOB if she is having an anxiety attack.  Her activity is limited to her body habitus and significant LE edema/"lymphedema" and arthritis.  Her fasting glucose readings are usually 110-120.  She is sitting in a wheelchair in NAD.  No conversational dyspnea. Her heart has a baseline RRR with occasional ectopic beat.  No significant murmur noted.  Right lung base initially with faint crackles but cleared with deep breaths. Significant edema in BLE, L > R.  Patient with multiple questions today, many of which pertaining to her diagnosis and treatment plans which I encouraged her to talk further with Dr. Derrell Lolling.  She is anxious about surgery because of her multiple co-morbidities and hypoxic event after surgery in 2001.  She says she is sensitive to medication.  She uses CPAP now, so RT can see patient post-operatively for CPAP.  I assured her that pulmonology is also available for consultation if needed.  She unsure if Dr. Derrell Lolling is planning to keep her overnight.  It may need to be considered based on her history depending on  how she does in recovery.  She has an aide periodically through the day, but is planning to arrange 24 hours care for at least a few days  post-operatively.    Velna Ochs [facility] Short Stay Center/Anesthesiology Phone [phone] 04/02/2013 3:29 PM

## 2013-04-03 NOTE — H&P (Signed)
Makayla Rodriguez    MRN:  [id number]   Description: 79 year old female  Provider: Ernestene Mention, MD  Department: [facility]        Diagnoses    Cancer of upper-outer quadrant of female breast, left    -  Primary    174.4      Reason for Visit    Breast Problem          History and Physical   Ernestene Mention, MD    Status: Addendum            Patient ID: Makayla Rodriguez, female   DOB: August 30, 1931, 79 y.o.   MRN: [id number]    Chief Complaint   Patient presents with   .  Breast Problem            HPI Makayla Rodriguez is a 79 y.o. female.  She is referred by Dr. Cain Saupe for evaluation and management of a newly diagnosed invasive ductal carcinoma left breast, upper outer quadrant, 1.4 cm, ER 100%, PR  0, HER-2-negative. Her primary care physician is Dr. Shaune Pollack. Dr. Vassie Loll is her pulmonologist.    The patient has a history of fibrocystic disease and has had a couple of breast biopsies in the past but no history of breast cancer. Recent screening mammograms show a suspicious density in the left breast, upper outer quadrant, 1.4 cm. Image guided biopsy shows invasive ductal carcinoma, ER positive, 100%. PR 0. HER-2 negative.Ki-67 31%. She has not had an MRI. We presented her case in breast conference Wed.   The consensus was that it was probably better to do a partial mastectomy. No indication for node dissection or sentinel node biopsy, as that would not alter adjuvant therapies for outcome.. She might be a candidate for antiestrogen therapy, or radiation therapy, but probably not both.   She has significant comorbidities. She has oxygen-dependent sleep apnea, obesity hypoventilation syndrome, osteoarthritis, limited mobility and wheelchair dependent, non-insulin-dependent diabetes mellitus, hypertension, no history of myocardial infarction. Past history umbilical hernia repair and laparoscopic cholecystectomy.   She is here with her sister and a good  friend. She is extremely pleasant.   Good insight. In a wheelchair. Using her oxygen  Intermittently.   FH negative for breast/ovarian cancer.       Past Medical History   Diagnosis  Date   .  Diabetes mellitus without complication     .  Hypertension     .  Anxiety     .  Arthritis     .  Obesity     .  CKD (chronic kidney disease), stage III     .  COPD (chronic obstructive pulmonary disease)           Past Surgical History   Procedure  Laterality  Date   .  Hernia repair       .  Tonsillectomy       .  Cholecystectomy       .   fybroid tumors       .  Breast surgery             Family History   Problem  Relation  Age of Onset   .  Heart failure  Mother     .  Gallbladder disease  Father          Social History History   Substance Use Topics   .  Smoking status:  Former Smoker -- 0.50 packs/day  for 40 years       Types:  Cigarettes       Quit date:  05/30/1980   .  Smokeless tobacco:  Never Used   .  Alcohol Use:  No         Allergies   Allergen  Reactions   .  Demerol [Meperidine]  Anaphylaxis       Reaction: Hypoventilation after surgery   .  Penicillins  Anaphylaxis   .  Phenergan [Promethazine Hcl]  Anaphylaxis       Reaction: hypoventilation   .  Codeine  Nausea And Vomiting   .  Macrobid [Nitrofurantoin]  Nausea And Vomiting   .  Relafen [Nabumetone]  Itching and Swelling         Current Outpatient Prescriptions   Medication  Sig  Dispense  Refill   .  ALPRAZolam (XANAX) 0.5 MG tablet  Take 0.5 mg by mouth 2 (two) times daily as needed for sleep.         Marland Kitchen  aspirin EC 81 MG tablet  Take 81 mg by mouth every morning.         Marland Kitchen  atorvastatin (LIPITOR) 10 MG tablet  Take 10 mg by mouth at bedtime.         .  calcium carbonate (TUMS EX) 750 MG chewable tablet  Chew 1 tablet by mouth daily as needed. For heartburn and indigestion         .  cholecalciferol (VITAMIN D) 1000 UNITS tablet  Take 1,000 Units by mouth every morning.         .   Chromium-Cinnamon 206 370 1117 MCG-MG CAPS  Take 1 tablet by mouth daily.         .  cyclobenzaprine (FLEXERIL) 5 MG tablet  Take 5 mg by mouth 3 (three) times daily as needed for muscle spasms.         .  diclofenac sodium (VOLTAREN) 1 % GEL  Apply 4 g topically 4 (four) times daily as needed. For joint pain         .  Dietary Management Product (TOZAL PO)  Take 3 tablets by mouth daily.         Marland Kitchen  docusate sodium (COLACE) 100 MG capsule  Take 100 mg by mouth every evening.         .  fexofenadine (ALLEGRA) 180 MG tablet  Take 180 mg by mouth at bedtime.         .  fluticasone (FLONASE) 50 MCG/ACT nasal spray  Place 2 sprays into the nose daily.         Marland Kitchen  glipiZIDE (GLUCOTROL XL) 5 MG 24 hr tablet  1/2 tab by mouth once daily         .  guaiFENesin (MUCINEX) 600 MG 12 hr tablet  Take 1,200 mg by mouth as needed.          Marland Kitchen  lisinopril (PRINIVIL,ZESTRIL) 5 MG tablet  Take 5 mg by mouth daily.         .  metFORMIN (GLUCOPHAGE) 500 MG tablet  Take 500 mg by mouth daily.         .  nebivolol (BYSTOLIC) 5 MG tablet  Take 5 mg by mouth daily.         .  pantoprazole (PROTONIX) 40 MG tablet  Take 40 mg by mouth every morning.         .  torsemide (DEMADEX) 20 MG tablet  Take  20 mg by mouth daily.         .  traMADol (ULTRAM) 50 MG tablet  1-2 tablets by mouth 2-3 times daily                     Review of Systems  Constitutional: Negative for fever, chills and unexpected weight change.  HENT: Negative for congestion, hearing loss, sore throat, trouble swallowing and voice change.   Eyes: Negative for visual disturbance.  Respiratory: Positive for apnea and shortness of breath. Negative for cough, wheezing and stridor.   Cardiovascular: Negative for chest pain, palpitations and leg swelling.  Gastrointestinal: Negative for nausea, vomiting, abdominal pain, diarrhea, constipation, blood in stool, abdominal distention and anal bleeding.  Genitourinary: Negative for hematuria, vaginal bleeding and  difficulty urinating.  Musculoskeletal: Positive for arthralgias, back pain, gait problem and myalgias.  Skin: Negative for rash and wound.  Neurological: Negative for seizures, syncope and headaches.  Hematological: Negative for adenopathy. Does not bruise/bleed easily.  Psychiatric/Behavioral: Negative for confusion.        Physical Exam   Constitutional: She is oriented to person, place, and time. She appears well-developed and well-nourished. No distress.  BMI 49.08. Alert. Mental status normal. Morbidly obese. In a wheelchair.  HENT:   Head: Normocephalic and atraumatic.   Nose: Nose normal.   Mouth/Throat: No oropharyngeal exudate.  Eyes: Conjunctivae and EOM are normal. Pupils are equal, round, and reactive to light. Left eye exhibits no discharge. No scleral icterus.  Neck: Neck supple. No JVD present. No tracheal deviation present. No thyromegaly present.  Cardiovascular: Normal rate, regular rhythm, normal heart sounds and intact distal pulses.    No murmur heard. Pulmonary/Chest: Effort normal and breath sounds normal. No respiratory distress. She has no wheezes. She has no rales. She exhibits no tenderness.  Both breasts are examined. They are large. There are pendulous. Biopsy site left breast upper outer quadrant looks okay. No palpable mass. No axillary adenopathy.  Abdominal: Soft. Bowel sounds are normal. She exhibits no distension and no mass. There is no tenderness. There is no rebound and no guarding.  Musculoskeletal: She exhibits no edema and no tenderness.  Lymphadenopathy:    She has no cervical adenopathy.  Neurological: She is alert and oriented to person, place, and time. She exhibits normal muscle tone. Coordination normal.  Skin: Skin is warm. No rash noted. She is not diaphoretic. No erythema. No pallor.  Psychiatric: She has a normal mood and affect. Her behavior is normal. Judgment and thought content normal.      Data Reviewed Imaging studies.  Histology. Breast diagnostic profile. Dr. Reginia Naas office notes.   Assessment    Invasive ductal carcinoma left breast, upper outer quadrant, 1.4 cm. ER 100%, PR 0. HER-2 negative.   Ki-67 31%. Clinical stage T1c,., N0 (stage I)   Oxygen dependent pulmonary disease   Sleep apnea on CPAP   Remote history of fibrocystic breast disease   Morbid obesity   Limited  mobility and wheelchair dependent   Non insulin dependent diabetes mellitus   History of umbilical hernia repair and laparoscopic cholecystectomy, Dr. Orpah Greek     Plan    Long discussion. Discussed NCCN  guidelines. Discussed her individual situation. Discussed recommendations from breast conference   She'll be scheduled for left partial mastectomy with needle localization   We will ask for medical clearance from her pulmonologist, Dr. Vassie Loll   I discussed the indications, details, techniques, numerous risk of the surgery with her  and her family. She is aware of the risk of bleeding, infection, reoperation for margin control, cardiac and pulmonary complications. She understands all these issues. All of her questions are answered. She agrees with this plan.          Angelia Mould. Derrell Lolling, M.D., [facility] General and Minimally invasive Surgery Breast and Colorectal Surgery Office:   [phone] Pager:   [phone]

## 2013-04-04 MED ORDER — VANCOMYCIN HCL 10 G IV SOLR
1500.0000 mg | INTRAVENOUS | Status: AC
  Administered 2013-04-05: 1500 mg via INTRAVENOUS
  Filled 2013-04-04 (×2): qty 1500

## 2013-04-05 ENCOUNTER — Ambulatory Visit
Admission: RE | Admit: 2013-04-05 | Discharge: 2013-04-05 | Disposition: A | Discharge status: Home / Self Care | Payer: Medicare Other | Source: Ambulatory Visit | Attending: General Surgery | Admitting: General Surgery

## 2013-04-05 ENCOUNTER — Ambulatory Visit (HOSPITAL_COMMUNITY): Payer: Medicare Other | Admitting: Anesthesiology

## 2013-04-05 ENCOUNTER — Encounter (HOSPITAL_COMMUNITY): Payer: Medicare Other | Admitting: Vascular Surgery

## 2013-04-05 ENCOUNTER — Encounter (HOSPITAL_COMMUNITY): Payer: Self-pay | Admitting: *Deleted

## 2013-04-05 ENCOUNTER — Encounter (HOSPITAL_COMMUNITY)
Admission: RE | Disposition: A | Discharge status: Home / Self Care | Payer: Self-pay | Source: Ambulatory Visit | Attending: General Surgery

## 2013-04-05 ENCOUNTER — Other Ambulatory Visit: Payer: Self-pay

## 2013-04-05 ENCOUNTER — Observation Stay (HOSPITAL_COMMUNITY)
Admission: RE | Admit: 2013-04-05 | Discharge: 2013-04-06 | Disposition: A | Discharge status: Home / Self Care | Payer: Medicare Other | Source: Ambulatory Visit | Attending: General Surgery | Admitting: General Surgery

## 2013-04-05 DIAGNOSIS — Z170 Estrogen receptor positive status [ER+]: Secondary | ICD-10-CM | POA: Insufficient documentation

## 2013-04-05 DIAGNOSIS — F411 Generalized anxiety disorder: Secondary | ICD-10-CM | POA: Insufficient documentation

## 2013-04-05 DIAGNOSIS — Z993 Dependence on wheelchair: Secondary | ICD-10-CM | POA: Insufficient documentation

## 2013-04-05 DIAGNOSIS — Z01812 Encounter for preprocedural laboratory examination: Secondary | ICD-10-CM | POA: Insufficient documentation

## 2013-04-05 DIAGNOSIS — Z9981 Dependence on supplemental oxygen: Secondary | ICD-10-CM | POA: Insufficient documentation

## 2013-04-05 DIAGNOSIS — Z01818 Encounter for other preprocedural examination: Secondary | ICD-10-CM | POA: Insufficient documentation

## 2013-04-05 DIAGNOSIS — N183 Chronic kidney disease, stage 3 unspecified: Secondary | ICD-10-CM | POA: Insufficient documentation

## 2013-04-05 DIAGNOSIS — G4730 Sleep apnea, unspecified: Secondary | ICD-10-CM | POA: Insufficient documentation

## 2013-04-05 DIAGNOSIS — M1990 Unspecified osteoarthritis, unspecified site: Secondary | ICD-10-CM | POA: Insufficient documentation

## 2013-04-05 DIAGNOSIS — I129 Hypertensive chronic kidney disease with stage 1 through stage 4 chronic kidney disease, or unspecified chronic kidney disease: Secondary | ICD-10-CM | POA: Insufficient documentation

## 2013-04-05 DIAGNOSIS — E662 Morbid (severe) obesity with alveolar hypoventilation: Secondary | ICD-10-CM | POA: Insufficient documentation

## 2013-04-05 DIAGNOSIS — J449 Chronic obstructive pulmonary disease, unspecified: Secondary | ICD-10-CM | POA: Insufficient documentation

## 2013-04-05 DIAGNOSIS — E119 Type 2 diabetes mellitus without complications: Secondary | ICD-10-CM | POA: Insufficient documentation

## 2013-04-05 DIAGNOSIS — C50419 Malignant neoplasm of upper-outer quadrant of unspecified female breast: Principal | ICD-10-CM | POA: Insufficient documentation

## 2013-04-05 DIAGNOSIS — C50412 Malignant neoplasm of upper-outer quadrant of left female breast: Secondary | ICD-10-CM

## 2013-04-05 DIAGNOSIS — C50912 Malignant neoplasm of unspecified site of left female breast: Secondary | ICD-10-CM

## 2013-04-05 DIAGNOSIS — Z6841 Body Mass Index (BMI) 40.0 and over, adult: Secondary | ICD-10-CM | POA: Insufficient documentation

## 2013-04-05 DIAGNOSIS — J4489 Other specified chronic obstructive pulmonary disease: Secondary | ICD-10-CM | POA: Insufficient documentation

## 2013-04-05 DIAGNOSIS — C50919 Malignant neoplasm of unspecified site of unspecified female breast: Secondary | ICD-10-CM

## 2013-04-05 HISTORY — PX: PARTIAL MASTECTOMY WITH NEEDLE LOCALIZATION: SHX6008

## 2013-04-05 LAB — CBC
Hemoglobin: 11.7 g/dL — ABNORMAL LOW (ref 12.0–15.0)
MCV: 92.4 fL (ref 78.0–100.0)
Platelets: 178 10*3/uL (ref 150–400)
RBC: 3.82 MIL/uL — ABNORMAL LOW (ref 3.87–5.11)
WBC: 9.8 10*3/uL (ref 4.0–10.5)

## 2013-04-05 LAB — CREATININE, SERUM
GFR calc Af Amer: 72 mL/min — ABNORMAL LOW (ref >90)
GFR calc non Af Amer: 62 mL/min — ABNORMAL LOW (ref >90)

## 2013-04-05 LAB — GLUCOSE, CAPILLARY: Glucose-Capillary: 139 mg/dL — ABNORMAL HIGH (ref 70–99)

## 2013-04-05 SURGERY — PARTIAL MASTECTOMY WITH NEEDLE LOCALIZATION
Anesthesia: General | Site: Breast | Laterality: Left | Wound class: Clean

## 2013-04-05 MED ORDER — ASPIRIN EC 81 MG PO TBEC
81.0000 mg | DELAYED_RELEASE_TABLET | Freq: Every morning | ORAL | Status: DC
  Administered 2013-04-06: 81 mg via ORAL
  Filled 2013-04-05: qty 1

## 2013-04-05 MED ORDER — ARTIFICIAL TEARS OP OINT
TOPICAL_OINTMENT | OPHTHALMIC | Status: DC | PRN
  Administered 2013-04-05: 1 via OPHTHALMIC

## 2013-04-05 MED ORDER — LIDOCAINE HCL (CARDIAC) 20 MG/ML IV SOLN
INTRAVENOUS | Status: DC | PRN
  Administered 2013-04-05: 80 mg via INTRAVENOUS

## 2013-04-05 MED ORDER — CHLORHEXIDINE GLUCONATE 4 % EX LIQD: 1.0000 "application " | Freq: Once | CUTANEOUS | Status: DC

## 2013-04-05 MED ORDER — ONDANSETRON HCL 4 MG/2ML IJ SOLN: 4.0000 mg | Freq: Four times a day (QID) | INTRAMUSCULAR | Status: DC | PRN

## 2013-04-05 MED ORDER — POTASSIUM CHLORIDE IN NACL 20-0.9 MEQ/L-% IV SOLN
INTRAVENOUS | Status: DC
  Administered 2013-04-05: 20:00:00 via INTRAVENOUS
  Filled 2013-04-05 (×3): qty 1000

## 2013-04-05 MED ORDER — ONDANSETRON HCL 4 MG PO TABS: 4.0000 mg | ORAL_TABLET | Freq: Four times a day (QID) | ORAL | Status: DC | PRN

## 2013-04-05 MED ORDER — 0.9 % SODIUM CHLORIDE (POUR BTL) OPTIME
TOPICAL | Status: DC | PRN
  Administered 2013-04-05: 1000 mL

## 2013-04-05 MED ORDER — LISINOPRIL 5 MG PO TABS
5.0000 mg | ORAL_TABLET | Freq: Every day | ORAL | Status: DC
  Administered 2013-04-06: 5 mg via ORAL
  Filled 2013-04-05: qty 1

## 2013-04-05 MED ORDER — FLUTICASONE PROPIONATE 50 MCG/ACT NA SUSP
2.0000 | Freq: Every day | NASAL | Status: AC
  Administered 2013-04-05 – 2013-04-06 (×2): 2 via NASAL
  Filled 2013-04-05: qty 16

## 2013-04-05 MED ORDER — FENTANYL CITRATE 0.05 MG/ML IJ SOLN
12.5000 ug | INTRAMUSCULAR | Status: DC | PRN
  Administered 2013-04-05: 12.5 ug via INTRAVENOUS
  Filled 2013-04-05: qty 2

## 2013-04-05 MED ORDER — BUPIVACAINE-EPINEPHRINE 0.25% -1:200000 IJ SOLN
INTRAMUSCULAR | Status: DC | PRN
  Administered 2013-04-05: 30 mL

## 2013-04-05 MED ORDER — POLYVINYL ALCOHOL 1.4 % OP SOLN
1.0000 [drp] | Freq: Three times a day (TID) | OPHTHALMIC | Status: DC | PRN
  Filled 2013-04-05: qty 15

## 2013-04-05 MED ORDER — LACTATED RINGERS IV SOLN
INTRAVENOUS | Status: DC
  Administered 2013-04-05: 12:00:00 via INTRAVENOUS

## 2013-04-05 MED ORDER — PHENYLEPHRINE HCL 10 MG/ML IJ SOLN
INTRAMUSCULAR | Status: DC | PRN
  Administered 2013-04-05: 80 ug via INTRAVENOUS
  Administered 2013-04-05: 120 ug via INTRAVENOUS
  Administered 2013-04-05: 80 ug via INTRAVENOUS

## 2013-04-05 MED ORDER — TORSEMIDE 20 MG PO TABS
20.0000 mg | ORAL_TABLET | Freq: Every day | ORAL | Status: DC
Start: 2013-04-05 — End: 2013-04-06
  Administered 2013-04-06: 20 mg via ORAL
  Filled 2013-04-05 (×2): qty 1

## 2013-04-05 MED ORDER — GLIPIZIDE ER 10 MG PO TB24
10.0000 mg | ORAL_TABLET | Freq: Every morning | ORAL | Status: DC
Start: 2013-04-06 — End: 2013-04-06
  Administered 2013-04-06: 10 mg via ORAL
  Filled 2013-04-05: qty 1

## 2013-04-05 MED ORDER — ALPRAZOLAM 0.5 MG PO TABS: 0.5000 mg | ORAL_TABLET | Freq: Two times a day (BID) | ORAL | Status: DC | PRN

## 2013-04-05 MED ORDER — BUPIVACAINE-EPINEPHRINE PF 0.25-1:200000 % IJ SOLN
INTRAMUSCULAR | Status: AC
  Filled 2013-04-05: qty 30

## 2013-04-05 MED ORDER — HYDROCODONE-ACETAMINOPHEN 5-325 MG PO TABS
1.0000 | ORAL_TABLET | ORAL | Status: DC | PRN
  Administered 2013-04-06 (×2): 1 via ORAL
  Filled 2013-04-05: qty 2

## 2013-04-05 MED ORDER — ATORVASTATIN CALCIUM 10 MG PO TABS
10.0000 mg | ORAL_TABLET | Freq: Every day | ORAL | Status: DC
Start: 2013-04-05 — End: 2013-04-06
  Administered 2013-04-05: 10 mg via ORAL
  Filled 2013-04-05 (×2): qty 1

## 2013-04-05 MED ORDER — PROPOFOL 10 MG/ML IV BOLUS
INTRAVENOUS | Status: DC | PRN
  Administered 2013-04-05: 50 mg via INTRAVENOUS
  Administered 2013-04-05: 150 mg via INTRAVENOUS

## 2013-04-05 MED ORDER — HYPROMELLOSE (GONIOSCOPIC) 2.5 % OP SOLN
1.0000 [drp] | Freq: Three times a day (TID) | OPHTHALMIC | Status: DC | PRN
  Filled 2013-04-05: qty 15

## 2013-04-05 MED ORDER — VITAMIN D3 25 MCG (1000 UNIT) PO TABS
1000.0000 [IU] | ORAL_TABLET | Freq: Every morning | ORAL | Status: DC
  Administered 2013-04-06: 1000 [IU] via ORAL
  Filled 2013-04-05: qty 1

## 2013-04-05 MED ORDER — POLYETHYLENE GLYCOL 3350 17 G PO PACK: 17.0000 g | PACK | Freq: Every day | ORAL | Status: DC | PRN

## 2013-04-05 MED ORDER — LACTATED RINGERS IV SOLN
INTRAVENOUS | Status: DC | PRN
  Administered 2013-04-05: 15:00:00 via INTRAVENOUS

## 2013-04-05 MED ORDER — METOCLOPRAMIDE HCL 5 MG/ML IJ SOLN: 10.0000 mg | Freq: Once | INTRAMUSCULAR | Status: DC | PRN

## 2013-04-05 MED ORDER — PANTOPRAZOLE SODIUM 40 MG PO TBEC
40.0000 mg | DELAYED_RELEASE_TABLET | Freq: Every morning | ORAL | Status: DC
  Administered 2013-04-06: 40 mg via ORAL
  Filled 2013-04-05: qty 1

## 2013-04-05 MED ORDER — EPHEDRINE SULFATE 50 MG/ML IJ SOLN
INTRAMUSCULAR | Status: DC | PRN
  Administered 2013-04-05 (×2): 10 mg via INTRAVENOUS

## 2013-04-05 MED ORDER — ONDANSETRON HCL 4 MG/2ML IJ SOLN
INTRAMUSCULAR | Status: DC | PRN
  Administered 2013-04-05: 4 mg via INTRAVENOUS

## 2013-04-05 MED ORDER — GLYCOPYRROLATE 0.2 MG/ML IJ SOLN
INTRAMUSCULAR | Status: DC | PRN
  Administered 2013-04-05: 0.2 mg via INTRAVENOUS

## 2013-04-05 MED ORDER — FENTANYL CITRATE 0.05 MG/ML IJ SOLN: 25.0000 ug | INTRAMUSCULAR | Status: DC | PRN

## 2013-04-05 MED ORDER — LORATADINE 10 MG PO TABS
10.0000 mg | ORAL_TABLET | Freq: Every day | ORAL | Status: DC
  Administered 2013-04-06: 10 mg via ORAL
  Filled 2013-04-05: qty 1

## 2013-04-05 MED ORDER — NEBIVOLOL HCL 5 MG PO TABS
5.0000 mg | ORAL_TABLET | Freq: Every day | ORAL | Status: DC
  Administered 2013-04-06: 5 mg via ORAL
  Filled 2013-04-05 (×2): qty 1

## 2013-04-05 MED ORDER — ENOXAPARIN SODIUM 40 MG/0.4ML ~~LOC~~ SOLN
40.0000 mg | SUBCUTANEOUS | Status: DC
  Administered 2013-04-06: 40 mg via SUBCUTANEOUS
  Filled 2013-04-05: qty 0.4

## 2013-04-05 MED ORDER — DOCUSATE SODIUM 100 MG PO CAPS: 100.0000 mg | ORAL_CAPSULE | Freq: Every day | ORAL | Status: DC | PRN

## 2013-04-05 MED ORDER — FENTANYL CITRATE 0.05 MG/ML IJ SOLN
INTRAMUSCULAR | Status: DC | PRN
  Administered 2013-04-05 (×2): 50 ug via INTRAVENOUS

## 2013-04-05 MED ORDER — SUCCINYLCHOLINE CHLORIDE 20 MG/ML IJ SOLN
INTRAMUSCULAR | Status: DC | PRN
  Administered 2013-04-05: 100 mg via INTRAVENOUS

## 2013-04-05 MED ORDER — CYCLOBENZAPRINE HCL 5 MG PO TABS: 5.0000 mg | ORAL_TABLET | Freq: Three times a day (TID) | ORAL | Status: DC | PRN

## 2013-04-05 SURGICAL SUPPLY — 43 items
ADH SKN CLS APL DERMABOND .7 (GAUZE/BANDAGES/DRESSINGS) ×1
APPLIER CLIP 11 MED OPEN (CLIP) ×2
APR CLP MED 11 20 MLT OPN (CLIP) ×1
BINDER BREAST XXLRG (GAUZE/BANDAGES/DRESSINGS) ×1 IMPLANT
BLADE SURG 15 STRL LF DISP TIS (BLADE) ×1 IMPLANT
BLADE SURG 15 STRL SS (BLADE) ×2
CANISTER SUCTION 2500CC (MISCELLANEOUS) ×2 IMPLANT
CHLORAPREP W/TINT 26ML (MISCELLANEOUS) ×2 IMPLANT
CLIP APPLIE 11 MED OPEN (CLIP) IMPLANT
COVER SURGICAL LIGHT HANDLE (MISCELLANEOUS) ×2 IMPLANT
DERMABOND ADVANCED (GAUZE/BANDAGES/DRESSINGS) ×1
DERMABOND ADVANCED .7 DNX12 (GAUZE/BANDAGES/DRESSINGS) ×1 IMPLANT
DEVICE DUBIN SPECIMEN MAMMOGRA (MISCELLANEOUS) ×2 IMPLANT
DRAPE CHEST BREAST 15X10 FENES (DRAPES) ×2 IMPLANT
DRAPE UTILITY 15X26 W/TAPE STR (DRAPE) ×4 IMPLANT
DRSG PAD ABDOMINAL 8X10 ST (GAUZE/BANDAGES/DRESSINGS) ×2 IMPLANT
ELECT CAUTERY BLADE 6.4 (BLADE) ×2 IMPLANT
ELECT REM PT RETURN 9FT ADLT (ELECTROSURGICAL) ×2
ELECTRODE REM PT RTRN 9FT ADLT (ELECTROSURGICAL) ×1 IMPLANT
GLOVE EUDERMIC 7 POWDERFREE (GLOVE) ×4 IMPLANT
GLOVE SURG SS PI 6.5 STRL IVOR (GLOVE) ×1 IMPLANT
GOWN STRL NON-REIN LRG LVL3 (GOWN DISPOSABLE) ×3 IMPLANT
GOWN STRL REIN XL XLG (GOWN DISPOSABLE) ×2 IMPLANT
KIT BASIN OR (CUSTOM PROCEDURE TRAY) ×2 IMPLANT
KIT MARKER MARGIN INK (KITS) ×1 IMPLANT
KIT ROOM TURNOVER OR (KITS) ×2 IMPLANT
NDL HYPO 25GX1X1/2 BEV (NEEDLE) ×1 IMPLANT
NEEDLE HYPO 25GX1X1/2 BEV (NEEDLE) ×2 IMPLANT
NS IRRIG 1000ML POUR BTL (IV SOLUTION) ×2 IMPLANT
PACK SURGICAL SETUP 50X90 (CUSTOM PROCEDURE TRAY) ×2 IMPLANT
PAD ARMBOARD 7.5X6 YLW CONV (MISCELLANEOUS) ×3 IMPLANT
PENCIL BUTTON HOLSTER BLD 10FT (ELECTRODE) ×2 IMPLANT
SPONGE GAUZE 4X4 12PLY (GAUZE/BANDAGES/DRESSINGS) ×1 IMPLANT
SPONGE LAP 18X18 X RAY DECT (DISPOSABLE) ×2 IMPLANT
SUT MNCRL AB 4-0 PS2 18 (SUTURE) ×2 IMPLANT
SUT SILK 2 0 SH (SUTURE) ×1 IMPLANT
SUT VIC AB 3-0 SH 18 (SUTURE) ×3 IMPLANT
SYR BULB 3OZ (MISCELLANEOUS) ×2 IMPLANT
SYR CONTROL 10ML LL (SYRINGE) ×2 IMPLANT
TOWEL OR 17X24 6PK STRL BLUE (TOWEL DISPOSABLE) ×2 IMPLANT
TOWEL OR 17X26 10 PK STRL BLUE (TOWEL DISPOSABLE) ×2 IMPLANT
TUBE CONNECTING 12X1/4 (SUCTIONS) ×2 IMPLANT
YANKAUER SUCT BULB TIP NO VENT (SUCTIONS) ×2 IMPLANT

## 2013-04-05 NOTE — Anesthesia Procedure Notes (Signed)
Procedure Name: Intubation Date/Time: 04/05/2013 3:26 PM Performed by: Gayla Medicus Pre-anesthesia Checklist: Patient identified, Timeout performed, Emergency Drugs available, Suction available and Patient being monitored Patient Re-evaluated:Patient Re-evaluated prior to inductionOxygen Delivery Method: Circle system utilized Preoxygenation: Pre-oxygenation with 100% oxygen Intubation Type: IV induction and Rapid sequence Ventilation: Mask ventilation without difficulty and Oral airway inserted - appropriate to patient size Laryngoscope Size: Mac and 3 Grade View: Grade II Tube type: Oral Tube size: 7.5 mm Number of attempts: 1 Airway Equipment and Method: Stylet and Video-laryngoscopy Placement Confirmation: ETT inserted through vocal cords under direct vision,  positive ETCO2 and breath sounds checked- equal and bilateral Secured at: 22 cm Tube secured with: Tape Dental Injury: Teeth and Oropharynx as per pre-operative assessment  Difficulty Due To: Difficulty was anticipated Future Recommendations: Recommend- induction with short-acting agent, and alternative techniques readily available

## 2013-04-05 NOTE — Anesthesia Postprocedure Evaluation (Signed)
  Anesthesia Post-op Note  Patient: Makayla Rodriguez  Procedure(s) Performed: Procedure(s): PARTIAL MASTECTOMY WITH NEEDLE LOCALIZATION (Left)  Patient Location: PACU  Anesthesia Type:General  Level of Consciousness: awake, alert  and oriented  Airway and Oxygen Therapy: Patient Spontanous Breathing  Post-op Pain: mild  Post-op Assessment: Post-op Vital signs reviewed  Post-op Vital Signs: Reviewed  Complications: No apparent anesthesia complications

## 2013-04-05 NOTE — Op Note (Signed)
Patient Name:           Makayla Rodriguez   Date of Surgery:        04/05/2013  Pre op Diagnosis:      Invasive ductal carcinoma left breast, upper outer quadrant, 1.4 cm, estrogen receptor 100%, progesterone receptor 0. HER-2 negative. He 6731%. Clinical stage T1c, N0.  (I)  Post op Diagnosis:    same  Procedure:                 Left partial mastectomy with needle localization, Reexcision of superior margin  Surgeon:                     Angelia Mould. Derrell Lolling, M.D., FACS  Assistant:                      None  Operative Indications:   Makayla Rodriguez is a 79 y.o. female. She is referred by Dr. Cain Saupe for evaluation and management of a newly diagnosed invasive ductal carcinoma left breast, upper outer quadrant, 1.4 cm, ER 100%, PR 0, HER-2-negative. Her primary care physician is Dr. Shaune Pollack. Dr. Vassie Loll is her pulmonologist.  The patient has a history of fibrocystic disease and has had a couple of breast biopsies in the past but no history of breast cancer. Recent screening mammograms show a suspicious density in the left breast, upper outer quadrant, 1.4 cm. Image guided biopsy shows invasive ductal carcinoma, ER positive, 100%. PR 0. HER-2 negative.Ki-67 31%. She has not had an MRI. We presented her case in breast conference Wed. The consensus was that it was probably better to do a partial mastectomy. No indication for node dissection or sentinel node biopsy, as that would not alter adjuvant therapies for outcome.. She might be a candidate for antiestrogen therapy, or radiation therapy, but probably not both.  She has significant comorbidities. She has oxygen-dependent sleep apnea, obesity hypoventilation syndrome, osteoarthritis, limited mobility and wheelchair dependent, non-insulin-dependent diabetes mellitus, hypertension, no history of myocardial infarction. Past history umbilical hernia repair and laparoscopic cholecystectomy.  FH negative for breast/ovarian cancer.   Operative Findings:        The localizing wire entered the left breast laterally and was directed medially and posteriorly. The rod was well placed very near the marker clip. The specimen mammogram contained the entire wire and the neoplastic distally, but it seemed over to one side. During the procedure I felt that I might be close to the cancer of the superior margin and so I reexcised the superior margin.  Procedure in Detail:          Wire localization was performed by Dr. Mayford Knife at the breast center of  [place]. The patient was brought to the operating room at [facility] and general anesthesia was induced. Intravenous antibiotics were given. Surgical time out was performed. The left breast was prepped and draped in a sterile fashion. After review of the imaging films I anesthetized the breast with 0.5% Marcaine with epinephrine. I made a curvilinear incision in the lateral left breast in the far lateral area immediately overlying the wire insertion site. This incision paralleled the areolar margin but was several centimeters lateral. Dissection was carried down into the breast tissue widely around the wire. I removed the specimen. The superior margin felt slightly thickened although I did not see cancer. I generously reexcised the superior margin and marked it as a separate specimen. The primary lumpectomy specimen was marked with sutures  and a 6 color ink kit. Specimen mammogram was read as being satisfactory and it was marked. The specimen was sent to the lab. The reexcised superior margin was sent as a separate specimen to the lab. The breast wound  was irrigated with saline. Hemostasis was excellent and achieved with electrocautery. The breast tissues were reconstructed as best as possible with 10 or 12 interrupted sutures of 3-0 Vicryl. There some volume loss laterally. The skin was closed with a running subcuticular suture of 4-0 Monocryl and Dermabond. Clean cushioning bandages and a breast binder were placed after  the Dermabond was dry. The patient tolerated the procedure well and was taken to recovery in stable condition. EBL 20 cc. Counts correct. Complications none.     Angelia Mould. Derrell Lolling, M.D., FACS General and Minimally Invasive Surgery Breast and Colorectal Surgery  04/05/2013 4:54 PM

## 2013-04-05 NOTE — Progress Notes (Signed)
Pt resting in bed, has possible afib rhythm on monitor, Dr.Hodierne notified and at bedside to eval patient, EKG completed, reporting NSR with 1deg block with PAC, no new orders rec'd ok to be transferred to floor

## 2013-04-05 NOTE — Anesthesia Preprocedure Evaluation (Addendum)
Anesthesia Evaluation  Patient identified by MRN, date of birth, ID band Patient awake    Reviewed: Allergy & Precautions, H&P , NPO status , Patient's Chart, lab work & pertinent test results  History of Anesthesia Complications (+) history of anesthetic complications  Airway Mallampati: III TM Distance: >3 FB Neck ROM: Full    Dental no notable dental hx. (+) Teeth Intact   Pulmonary shortness of breath, sleep apnea and Continuous Positive Airway Pressure Ventilation , COPD breath sounds clear to auscultation  Pulmonary exam normal       Cardiovascular hypertension, Pt. on medications and Pt. on home beta blockers +CHF negative cardio ROS  Rhythm:Regular Rate:Normal     Neuro/Psych Anxiety negative neurological ROS     GI/Hepatic GERD-  Medicated and Controlled,  Endo/Other  diabetes, Poorly Controlled, Type 2, Oral Hypoglycemic Agents and Insulin DependentMorbid obesityHyperlipidemia Left Breast Ca  Renal/GU Renal InsufficiencyRenal disease  negative genitourinary   Musculoskeletal  (+) Arthritis -,   Abdominal (+) + obese,   Peds  Hematology negative hematology ROS (+)   Anesthesia Other Findings   Reproductive/Obstetrics negative OB ROS                          Anesthesia Physical Anesthesia Plan  ASA: III  Anesthesia Plan: General   Post-op Pain Management:    Induction: Intravenous  Airway Management Planned: LMA  Additional Equipment:   Intra-op Plan:   Post-operative Plan: Extubation in OR  Informed Consent: I have reviewed the patients History and Physical, chart, labs and discussed the procedure including the risks, benefits and alternatives for the proposed anesthesia with the patient or authorized representative who has indicated his/her understanding and acceptance.   Dental advisory given  Plan Discussed with: Anesthesiologist, CRNA and Surgeon  Anesthesia  Plan Comments:         Anesthesia Quick Evaluation

## 2013-04-05 NOTE — Preoperative (Signed)
Beta Blockers   Reason not to administer Beta Blockers:Not Applicable 

## 2013-04-05 NOTE — Interval H&P Note (Signed)
History and Physical Interval Note:  04/05/2013 2:14 PM  Makayla Rodriguez  has presented today for surgery, with the diagnosis of Left breast cancer  The various methods of treatment have been discussed with the patient and family. After consideration of risks, benefits and other options for treatment, the patient has consented to  Procedure(s): PARTIAL MASTECTOMY WITH NEEDLE LOCALIZATION (Left) as a surgical intervention .  The patient's history has been reviewed, patient examined, no change in status, stable for surgery.  I have reviewed the patient's chart and labs.  Questions were answered to the patient's satisfaction.     Ernestene Mention

## 2013-04-05 NOTE — Transfer of Care (Signed)
Immediate Anesthesia Transfer of Care Note  Patient: Makayla Rodriguez  Procedure(s) Performed: Procedure(s): PARTIAL MASTECTOMY WITH NEEDLE LOCALIZATION (Left)  Patient Location: PACU  Anesthesia Type:General  Level of Consciousness: awake, alert  and oriented  Airway & Oxygen Therapy: Patient Spontanous Breathing and Patient connected to face mask oxygen  Post-op Assessment: Report given to PACU RN and Post -op Vital signs reviewed and stable  Post vital signs: Reviewed and stable  Complications: No apparent anesthesia complications

## 2013-04-06 DIAGNOSIS — I129 Hypertensive chronic kidney disease with stage 1 through stage 4 chronic kidney disease, or unspecified chronic kidney disease: Secondary | ICD-10-CM | POA: Diagnosis not present

## 2013-04-06 DIAGNOSIS — C50419 Malignant neoplasm of upper-outer quadrant of unspecified female breast: Secondary | ICD-10-CM | POA: Diagnosis not present

## 2013-04-06 DIAGNOSIS — F411 Generalized anxiety disorder: Secondary | ICD-10-CM | POA: Diagnosis not present

## 2013-04-06 DIAGNOSIS — Z170 Estrogen receptor positive status [ER+]: Secondary | ICD-10-CM | POA: Diagnosis not present

## 2013-04-06 DIAGNOSIS — E119 Type 2 diabetes mellitus without complications: Secondary | ICD-10-CM | POA: Diagnosis not present

## 2013-04-06 LAB — GLUCOSE, CAPILLARY: Glucose-Capillary: 146 mg/dL — ABNORMAL HIGH (ref 70–99)

## 2013-04-06 MED ORDER — HYDROCODONE-ACETAMINOPHEN 5-325 MG PO TABS: 1.0000 | ORAL_TABLET | ORAL | Status: DC | PRN

## 2013-04-06 NOTE — Progress Notes (Signed)
Pt c/o that she's not receiving enough O2 through her CPAP and feel like she's suffocating. O2 sats.checked it's 100%. RT called, and started O2 via nasal cannula.

## 2013-04-06 NOTE — Progress Notes (Signed)
Placed patient on auto titrate max 20 min 5 with 3lpm 02 bleed in.  Patient is wearing a medium nasal mask and tolerating well.

## 2013-04-06 NOTE — Progress Notes (Signed)
Patient could not tolerate hospital cpap mask.  Patient is now wearing 2.5 lpm nasal canula.

## 2013-04-06 NOTE — Progress Notes (Signed)
1 Day Post-Op  Subjective: No issues  Objective: Vital signs in last 24 hours: Temp:  [97.3 F (36.3 C)-98.5 F (36.9 C)] 97.7 F (36.5 C) (11/08 0543) Pulse Rate:  [76-109] 76 (11/08 0543) Resp:  [10-20] 20 (11/08 0543) BP: (101-169)/(47-84) 135/53 mmHg (11/08 0543) SpO2:  [93 %-100 %] 97 % (11/08 0543) Weight:  [288 lb 9.3 oz (130.9 kg)] 288 lb 9.3 oz (130.9 kg) (11/07 2145) Last BM Date: 04/03/13  Intake/Output from previous day: 11/07 0701 - 11/08 0700 In: 1880 [P.O.:530; I.V.:1350] Out: 250 [Urine:250] Intake/Output this shift: Total I/O In: 220 [P.O.:220] Out: -   General appearance: alert, cooperative and no distress Resp: nonlabored Breasts: left breast examined with chaperone, incision looks okay with slight bruising.  no evidence of infection or hematoma Cardio: normal rate  Lab Results:   Recent Labs  04/05/13 2227  WBC 9.8  HGB 11.7*  HCT 35.3*  PLT 178   BMET  Recent Labs  04/05/13 2227  CREATININE 0.87   PT/INR No results found for this basename: LABPROT, INR,  in the last 72 hours ABG No results found for this basename: PHART, PCO2, PO2, HCO3,  in the last 72 hours  Studies/Results: [facility] Breast Loc Dev   1st Lesion  Inc Mammo Guide  04/05/2013   CLINICAL DATA:  Left breast cancer. The patient presents for localization prior lumpectomy.  EXAM: NEEDLE LOCALIZATION OF THE LEFT BREAST WITH MAMMO GUIDANCE  COMPARISON:  Previous exams.  FINDINGS: Patient presents for needle localization prior to wire localization. I met with the patient and we discussed the procedure of needle localization including benefits and alternatives. We discussed the high likelihood of a successful procedure. We discussed the risks of the procedure, including infection, bleeding, tissue injury, and further surgery. Informed, written consent was given. The usual time-out protocol was performed immediately prior to the procedure.  Using mammographic guidance, sterile  technique, 2% lidocaine and a 9 cm modified Kopans needle, a mass and biopsy clip were localized using a lateral to medial approach. The films were marked for Dr. Derrell Lolling.  Specimen radiograph was performed at [facility], and confirms a mass, a biopsy clip, and intact wire present in the tissue sample. The specimen was marked for pathology.  IMPRESSION: Needle localization left breast. No apparent complications.   Electronically Signed   By: Britta Mccreedy M.D.   On: 04/05/2013 17:06    Anti-infectives: Anti-infectives   Start     Dose/Rate Route Frequency Ordered Stop   04/05/13 0600  vancomycin (VANCOCIN) 1,500 mg in sodium chloride 0.9 % 500 mL IVPB     1,500 mg 250 mL/hr over 120 Minutes Intravenous On call to O.R. 04/04/13 1438 04/05/13 1515      Assessment/Plan: s/p Procedure(s): PARTIAL MASTECTOMY WITH NEEDLE LOCALIZATION (Left) she has several chronic issues such as pulmonary and arthritis which are chronic.  her wound looks okay and she has nurses aide and should be okay for discharge later today  LOS: 1 day    Makayla Rodriguez DAVID 04/06/2013

## 2013-04-08 ENCOUNTER — Encounter (HOSPITAL_COMMUNITY): Payer: Self-pay | Admitting: General Surgery

## 2013-04-09 ENCOUNTER — Telehealth (INDEPENDENT_AMBULATORY_CARE_PROVIDER_SITE_OTHER): Payer: Self-pay

## 2013-04-09 ENCOUNTER — Other Ambulatory Visit (INDEPENDENT_AMBULATORY_CARE_PROVIDER_SITE_OTHER): Payer: Self-pay

## 2013-04-09 DIAGNOSIS — C50412 Malignant neoplasm of upper-outer quadrant of left female breast: Secondary | ICD-10-CM

## 2013-04-09 NOTE — Telephone Encounter (Signed)
Message copied by Ivory Broad on Tue Apr 09, 2013  9:14 AM ------      Message from: Ernestene Mention      Created: Tue Apr 09, 2013  8:30 AM       Inform patient of Pathology report,.Negative margins. No more surgery required.  Needs referral to med onc ([facility] or [facility])   and rad- onc(first available). ------

## 2013-04-09 NOTE — Progress Notes (Signed)
Quick Note:  Inform patient of Pathology report,.Negative margins. No more surgery required. Needs referral to med onc ([facility] or [facility]) and rad- onc(first available). ______

## 2013-04-09 NOTE — Telephone Encounter (Signed)
Left message for pt to call and get her pathology results below.  I will refer her to med and rad onc.  She also needs a po appointment.  I will schedule her for 11/21 at 3:15

## 2013-04-09 NOTE — Addendum Note (Signed)
Addendum created 04/09/13 1215 by Tyrone Apple. Malen Gauze, MD   Modules edited: Anesthesia Attestations

## 2013-04-09 NOTE — Telephone Encounter (Signed)
Pt returned call. Pt given attached information. Pt will await call with onc and rad onc dates.

## 2013-04-11 ENCOUNTER — Telehealth: Payer: Self-pay | Admitting: *Deleted

## 2013-04-11 NOTE — Telephone Encounter (Signed)
Called pt and confirmed 04/12/13 appt w/ pt.  Unable to mail before appt letter - gave verbal.  Unable to mail packet - placed note for pt to get one at time of check in.  Emailed Huntley Dec at [facility] to make aware.  Made chart.

## 2013-04-12 ENCOUNTER — Ambulatory Visit: Payer: Medicare Other

## 2013-04-12 ENCOUNTER — Telehealth: Payer: Self-pay | Admitting: *Deleted

## 2013-04-12 ENCOUNTER — Encounter (INDEPENDENT_AMBULATORY_CARE_PROVIDER_SITE_OTHER): Payer: Self-pay

## 2013-04-12 ENCOUNTER — Ambulatory Visit (HOSPITAL_BASED_OUTPATIENT_CLINIC_OR_DEPARTMENT_OTHER): Payer: Medicare Other | Admitting: Oncology

## 2013-04-12 ENCOUNTER — Encounter: Payer: Self-pay | Admitting: Oncology

## 2013-04-12 ENCOUNTER — Other Ambulatory Visit: Payer: Medicare Other | Admitting: Lab

## 2013-04-12 VITALS — BP 140/85 | HR 85 | Temp 97.3°F | Resp 20 | Ht 63.0 in

## 2013-04-12 DIAGNOSIS — C50419 Malignant neoplasm of upper-outer quadrant of unspecified female breast: Secondary | ICD-10-CM

## 2013-04-12 DIAGNOSIS — C50412 Malignant neoplasm of upper-outer quadrant of left female breast: Secondary | ICD-10-CM

## 2013-04-12 DIAGNOSIS — Z170 Estrogen receptor positive status [ER+]: Secondary | ICD-10-CM

## 2013-04-12 NOTE — Progress Notes (Signed)
Makayla Rodriguez [id number] [birth year] 79 y.o. 04/12/2013 11:52 AM  CC  Hollice Espy, MD [address] [place] [address] Dr. Claud Kelp Dr. Lurline Hare  REASON FOR CONSULTATION:  79 year old female with new diagnosis of left breast cancer. Patient is seen in medical oncology for discussion of treatment options  STAGE:  T1 C. N0 Invasive ductal carcinoma left breast 1.6 cm, grade 2 ER +100% PR 0% proliferation marker Ki-67 51% Stage II   REFERRING PHYSICIAN: Dr. Claud Kelp  HISTORY OF PRESENT ILLNESS:  Makayla Rodriguez is a 79 y.o. female.  Who presented for a screening mammogram and was found to have an abnormality in the left breast. Biopsy revealed invasive ductal carcinoma. Ultrasound confirmed 1.2 cm x 1.5 cm area no abnormal appearing lymph nodes. Patient underwent a lumpectomy on 04/05/2013. The final pathology revealed a 1.6 cm invasive ductal carcinoma margins were negative tumor was ER +100% PR negative HER-2/neu negative with an elevated proliferation marker Ki-67 of 51%. Sentinel node was not performed. Postoperatively patient is doing well. She has multiple comorbidities as noted in the past medical history. Patient did not have sentinel node performed because of high risk of postoperative complications. She will be seen by radiation oncology for consideration of post lumpectomy radiation therapy.   Past Medical History: Past Medical History  Diagnosis Date  . Diabetes mellitus without complication   . Hypertension   . Anxiety   . Arthritis   . Obesity   . CKD (chronic kidney disease), stage III   . COPD (chronic obstructive pulmonary disease)     uses O2 at home  . Shortness of breath     with annxiety attack  . CHF (congestive heart failure)     12/13 admission  . GERD (gastroesophageal reflux disease)   . Seasonal allergies   . Complication of anesthesia     O2 sat drop during  and went into coma  . Sleep apnea      Past Surgical History: Past Surgical History  Procedure Laterality Date  . Tonsillectomy    . Cholecystectomy    .  fybroid tumors    . Breast surgery    . Eye surgery      cataracts  . Hernia repair      umbilical  . Pilonidal cyst excision      buttock  . Partial mastectomy with needle localization Left 04/05/2013    Procedure: PARTIAL MASTECTOMY WITH NEEDLE LOCALIZATION;  Surgeon: Ernestene Mention, MD;  Location: [facility] OR;  Service: General;  Laterality: Left;    Family History: Family History  Problem Relation Age of Onset  . Heart failure Mother   . Gallbladder disease Father     Social History History  Substance Use Topics  . Smoking status: Former Smoker -- 0.50 packs/day for 40 years    Types: Cigarettes    Quit date: 05/30/1980  . Smokeless tobacco: Never Used  . Alcohol Use: No    Allergies: Allergies  Allergen Reactions  . Demerol [Meperidine] Anaphylaxis    Reaction: Hypoventilation after surgery  . Penicillins Anaphylaxis  . Phenergan [Promethazine Hcl] Anaphylaxis    Reaction: hypoventilation  . Codeine Nausea And Vomiting  . Macrobid [Nitrofurantoin] Nausea And Vomiting  . Relafen [Nabumetone] Itching and Swelling    Current Medications: Current Outpatient Prescriptions  Medication Sig Dispense Refill  . ALPRAZolam (XANAX) 0.5 MG tablet Take 0.5 mg by mouth 2 (two) times daily as needed for sleep.      Marland Kitchen  aspirin EC 81 MG tablet Take 81 mg by mouth every morning.      Marland Kitchen atorvastatin (LIPITOR) 10 MG tablet Take 10 mg by mouth at bedtime.      . calcium carbonate (TUMS EX) 750 MG chewable tablet Chew 1 tablet by mouth daily as needed. For heartburn and indigestion      . cholecalciferol (VITAMIN D) 1000 UNITS tablet Take 1,000 Units by mouth every morning.      . Chromium-Cinnamon [phone] MCG-MG CAPS Take 1 tablet by mouth at bedtime.       . cyclobenzaprine (FLEXERIL) 5 MG tablet Take 5 mg by mouth 3 (three) times daily as needed for muscle  spasms.      . diclofenac sodium (VOLTAREN) 1 % GEL Apply 4 g topically 2 (two) times daily as needed (for joint pain). For joint pain      . Dietary Management Product (TOZAL PO) Take 3 tablets by mouth daily. For eyes      . docusate sodium (COLACE) 100 MG capsule Take 100-200 mg by mouth at bedtime as needed for constipation.       . fexofenadine (ALLEGRA) 180 MG tablet Take 180 mg by mouth at bedtime.      . fluticasone (FLONASE) 50 MCG/ACT nasal spray Place 2 sprays into the nose daily.       Marland Kitchen glipiZIDE (GLUCOTROL XL) 10 MG 24 hr tablet Take 10 mg by mouth every morning.      . hydroxypropyl methylcellulose (ISOPTO TEARS) 2.5 % ophthalmic solution Place 1 drop into both eyes 3 (three) times daily as needed (for dry eyes).      Marland Kitchen lisinopril (PRINIVIL,ZESTRIL) 5 MG tablet Take 5 mg by mouth daily.      . nebivolol (BYSTOLIC) 5 MG tablet Take 5 mg by mouth daily.      . pantoprazole (PROTONIX) 40 MG tablet Take 40 mg by mouth every morning.      . polyethylene glycol (MIRALAX / GLYCOLAX) packet Take 17 g by mouth daily as needed (for constipation).      . torsemide (DEMADEX) 20 MG tablet Take 20 mg by mouth daily.      . traMADol (ULTRAM) 50 MG tablet 50 mg 3 (three) times daily.        No current facility-administered medications for this visit.    OB/GYN History: menarche 67 patient underwent menopause at 70 she was never been on hormone replacement therapy she is nulliparous  Fertility Discussion: not applicable Prior History of Cancer: no  Health Maintenance:  Colonoscopy  Bone Densityyes yes Last PAP smear 2014  ECOG PERFORMANCE STATUS: 2 - Symptomatic, <50% confined to bed  Genetic Counseling/testing: no  REVIEW OF SYSTEMS:  Comprehensive 14 point review of system was obtained and it is scan separately into the electronic medical record  PHYSICAL EXAMINATION: Blood pressure 140/85, pulse 85, temperature 97.3 F (36.3 C), temperature source Oral, resp. rate 20, height  5\' 3"  (1.6 m), weight 0 lb (0 kg), SpO2 97.00%.  ZOX:WRUEA, cooperative, dyspneic and pale SKIN: no rashes or significant lesions HEAD: Normocephalic EYES: PERRLA, EOMI EARS: External ears normal OROPHARYNX:no exudate and no erythema  NECK: no adenopathy LYMPH:  no palpable lymphadenopathy, no hepatosplenomegaly BREAST:right breast normal without mass, skin or nipple changes or axillary nodes, left breast normal without mass, skin or nipple changes or axillary nodes, surgical scars noted the left breast LUNGS: clear to auscultation  HEART: regular rate & rhythm ABDOMEN:abdomen soft, normal bowel sounds and no  masses or organomegaly BACK: No CVA tenderness EXTREMITIES:less then 2 second capillary refill  NEURO: alert & oriented x 3 with fluent speech, no focal motor/sensory deficits     STUDIES/RESULTS: Dg Chest 2 View  04/02/2013   CLINICAL DATA:  Hypertension.  EXAM: CHEST  2 VIEW  COMPARISON:  Chest radiograph 05/19/2012.  FINDINGS: Stable cardiac and mediastinal contours. Persistent pleural-based density within the left lower hemi thorax. No large consolidative pulmonary opacities. No definite pleural effusion or pneumothorax. Mid thoracic spine degenerative change. Cholecystectomy clips.  IMPRESSION: 1. No acute cardiopulmonary process. 2. 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   Electronically Signed   By: Annia Belt M.D.   On: 04/02/2013 12:07   Mm Lt Plc Breast Loc Dev   1st Lesion  Inc Mammo Guide  04/05/2013   CLINICAL DATA:  Left breast cancer. The patient presents for localization prior lumpectomy.  EXAM: NEEDLE LOCALIZATION OF THE LEFT BREAST WITH MAMMO GUIDANCE  COMPARISON:  Previous exams.  FINDINGS: Patient presents for needle localization prior to wire localization. I met with the patient and we discussed the procedure of needle localization including benefits and  alternatives. We discussed the high likelihood of a successful procedure. We discussed the risks of the procedure, including infection, bleeding, tissue injury, and further surgery. Informed, written consent was given. The usual time-out protocol was performed immediately prior to the procedure.  Using mammographic guidance, sterile technique, 2% lidocaine and a 9 cm modified Kopans needle, a mass and biopsy clip were localized using a lateral to medial approach. The films were marked for Dr. Derrell Lolling.  Specimen radiograph was performed at [facility], and confirms a mass, a biopsy clip, and intact wire present in the tissue sample. The specimen was marked for pathology.  IMPRESSION: Needle localization left breast. No apparent complications.   Electronically Signed   By: Britta Mccreedy M.D.   On: 04/05/2013 17:06     LABS:    Chemistry      Component Value Date/Time   NA 140 04/02/2013 1022   K 4.2 04/02/2013 1022   CL 97 04/02/2013 1022   CO2 32 04/02/2013 1022   BUN 28* 04/02/2013 1022   CREATININE 0.87 04/05/2013 2227      Component Value Date/Time   CALCIUM 10.0 04/02/2013 1022   ALKPHOS 74 04/02/2013 1022   AST 14 04/02/2013 1022   ALT 13 04/02/2013 1022   BILITOT 0.4 04/02/2013 1022      Lab Results  Component Value Date   WBC 9.8 04/05/2013   HGB 11.7* 04/05/2013   HCT 35.3* 04/05/2013   MCV 92.4 04/05/2013   PLT 178 04/05/2013   PATHOLOGY:  ASSESSMENT    79 year old female with new diagnosis of 1.6 cm invasive ductal carcinoma, grade 2, ER positive PR negative HER-2/neu negative proliferation marker Ki-67 elevated at 51%. Patient is status post lumpectomy performed on 04/05/2013. Sentinel lymph node biopsy was not performed. Patient is now seen in medical oncology for discussion of adjuvant treatment options. Patient has multiple comorbidities as listed. I do think that she may be a good candidate for antiestrogen therapy adjuvantly. We discussed the rationale for this. We also discussed  her pathology in detail, we discussed the treatment options for breast cancer that is stage I.  We discussed antiestrogen therapy in detail including tamoxifen versus aromatase inhibitors. We discussed side effects of each of these agents.  Patient is scheduled  to be seen by radiation oncology we discussed that the antiestrogen therapy would begin after  Radiation. I  Clinical Trial Eligibility:no  Multidisciplinary conference discussion yes      PLAN:    #1 patient will proceed with consultation with radiation oncology.  #2 patient will return to see me in a few months time or sooner if need arises to discuss adjuvant antiestrogen therapy. In this individual We would consider using either Arimidex or tamoxifen.       Discussion: Patient is being treated per NCCN breast cancer care guidelines appropriate for stage.I   Thank you so much for allowing me to participate in the care of Makayla Rodriguez. I will continue to follow up the patient with you and assist in her care.  All questions were answered. The patient knows to call the clinic with any problems, questions or concerns. We can certainly see the patient much sooner if necessary.  I spent 40 minutes counseling the patient face to face. The total time spent in the appointment was 60 minutes.  Drue Second, MD Medical/Oncology [facility] [phone] (beeper) [phone] (Office)  04/12/2013, 11:52 AM

## 2013-04-12 NOTE — Telephone Encounter (Signed)
appts made and printed...td 

## 2013-04-15 NOTE — Progress Notes (Signed)
Location of Breast Cancer:Left breast cancer upper outer quadrant  1.4 cm  Histology per Pathology Report:04/05/2013 Diagnosis 1. Breast, lumpectomy, Left - INVASIVE DUCTAL CARCINOMA, SEE COMMENT. - INVASIVE TUMOR IS 1 MM FROM NEAREST MARGIN (SUPERIOR). - PREVIOUS BIOPSY SITE. - SEE TUMOR TEMPLATE BELOW. 2. Breast, excision, Left, superior margin - BENIGN BREAST TISSUE, SEE COMMENT. - NEGATIVE FOR ATYPIA OR MALIGNANCY. - SURGICAL MARGIN, NEGATIVE FOR ATYPIA OR MALIGNANCY   Receptor Status: ER(+), PR (0), Her2-neu (-)  Did patient present with symptoms (if so, please note symptoms) or was this found on screening mammography  Past/Anticipated interventions by surgeon, if ZOX:WRUE partial mastectomy with needle localization of superior margin on 04/05/2013.Post op follow up on Friday 04/19/13.  Past/Anticipated interventions by medical oncology, if any: Chemotherapy:No chemotherapy but may be candidate for anti-estrogen and radiation but not both.  Lymphedema issues, if any:No  Pain issues, if any:Has feeling of "heaviness" in left breast. Takes tramadol for relief.Doesn't like oxycodone.  SAFETY ISSUES:  Prior radiation? No  Pacemaker/ICD?No  Possible current pregnancy?No  Is the patient on methotrexate?No  Current Complaints / other details:Patient has several co morbidities.Oxygen and wheelchair dependent. Uses CPAP for sleep apnea, No family history of breast/ovarin cancer.Single, no children.Has certified nurse assistants to help with care at home. Surgeon;Dr.Haywood Derrell Lolling Medical Oncologist:Dr.[place] Breed Pulmonologist:Dr.Alva    Tessa Lerner, RN 04/15/2013,12:23 PM

## 2013-04-17 ENCOUNTER — Ambulatory Visit
Admission: RE | Admit: 2013-04-17 | Discharge: 2013-04-17 | Disposition: A | Discharge status: Home / Self Care | Payer: Medicare Other | Source: Ambulatory Visit | Attending: Radiation Oncology | Admitting: Radiation Oncology

## 2013-04-17 ENCOUNTER — Encounter: Payer: Self-pay | Admitting: Radiation Oncology

## 2013-04-17 VITALS — BP 143/56 | HR 69 | Temp 98.0°F | Resp 18 | Wt 288.0 lb

## 2013-04-17 DIAGNOSIS — I129 Hypertensive chronic kidney disease with stage 1 through stage 4 chronic kidney disease, or unspecified chronic kidney disease: Secondary | ICD-10-CM | POA: Insufficient documentation

## 2013-04-17 DIAGNOSIS — C50419 Malignant neoplasm of upper-outer quadrant of unspecified female breast: Secondary | ICD-10-CM | POA: Diagnosis not present

## 2013-04-17 DIAGNOSIS — Z9981 Dependence on supplemental oxygen: Secondary | ICD-10-CM | POA: Insufficient documentation

## 2013-04-17 DIAGNOSIS — J449 Chronic obstructive pulmonary disease, unspecified: Secondary | ICD-10-CM | POA: Diagnosis not present

## 2013-04-17 DIAGNOSIS — Z79899 Other long term (current) drug therapy: Secondary | ICD-10-CM | POA: Insufficient documentation

## 2013-04-17 DIAGNOSIS — C50919 Malignant neoplasm of unspecified site of unspecified female breast: Secondary | ICD-10-CM | POA: Insufficient documentation

## 2013-04-17 DIAGNOSIS — J4489 Other specified chronic obstructive pulmonary disease: Secondary | ICD-10-CM | POA: Insufficient documentation

## 2013-04-17 DIAGNOSIS — N183 Chronic kidney disease, stage 3 unspecified: Secondary | ICD-10-CM | POA: Insufficient documentation

## 2013-04-17 DIAGNOSIS — R609 Edema, unspecified: Secondary | ICD-10-CM | POA: Diagnosis not present

## 2013-04-17 DIAGNOSIS — Z87891 Personal history of nicotine dependence: Secondary | ICD-10-CM | POA: Insufficient documentation

## 2013-04-17 DIAGNOSIS — C50412 Malignant neoplasm of upper-outer quadrant of left female breast: Secondary | ICD-10-CM

## 2013-04-17 DIAGNOSIS — E119 Type 2 diabetes mellitus without complications: Secondary | ICD-10-CM | POA: Insufficient documentation

## 2013-04-17 DIAGNOSIS — K219 Gastro-esophageal reflux disease without esophagitis: Secondary | ICD-10-CM | POA: Diagnosis not present

## 2013-04-17 DIAGNOSIS — Z170 Estrogen receptor positive status [ER+]: Secondary | ICD-10-CM | POA: Insufficient documentation

## 2013-04-17 DIAGNOSIS — E669 Obesity, unspecified: Secondary | ICD-10-CM | POA: Insufficient documentation

## 2013-04-17 NOTE — Progress Notes (Signed)
Radiation Oncology         [phone]) [phone] ________________________________  Initial outpatient Consultation - Date: 04/17/2013   Name: Makayla Rodriguez MRN: [id number]   DOB: [birth year]  REFERRING PHYSICIAN: Ernestene Mention, MD  DIAGNOSIS: T1cN0 Invasive Ductal Carcinoma of the Left breast  HISTORY OF PRESENT ILLNESS::Makayla Rodriguez is a 79 y.o. female  who presented for her first screening mammogram in 4 years. An abnormality was seen in the left breast. A biopsy revealed this to be an invasive ductal carcinoma. Ultrasound confirmed a 1.2 x 1.0 x 1.5 cm mass no abnormal appearing lymph nodes were noted. After discussion with Dr. Derrell Lolling she underwent a lumpectomy on 04/05/2013. This revealed a 1.6 cm invasive ductal carcinoma with negative margins. The tumor was ER positive at 100% PR negative HER-2 negative with a Ki-67 of 51%. Sentinel lymph node biopsy was not performed. She saw Dr. Welton Flakes and discussed antiestrogen therapy. She was referred by Dr. Derrell Lolling for consideration of whole breast radiation in the management of her disease. She does have significant medical comorbidities including obesity, lower extremity edema, COPD for which he is oxygen dependent, hypertension and chronic kidney disease. He is accompanied by her sister-in-law today who is a Engineer, civil (consulting). She has some concerns about the amount of fluid that is present in her breast. She also has concerns about her my ability to transfer for multiple treatments of radiation. She spends most of her time in a chair and has a CNA who performs most of her activities of daily living. She has a very supportive family. She is G0 P0. She was never on hormone replacement. She is taking tramadol for pain.  PREVIOUS RADIATION THERAPY: No  PAST MEDICAL HISTORY:  has a past medical history of Diabetes mellitus without complication; Hypertension; Anxiety; Arthritis; Obesity; CKD (chronic kidney disease), stage III; COPD (chronic obstructive pulmonary disease);  Shortness of breath; CHF (congestive heart failure); GERD (gastroesophageal reflux disease); Seasonal allergies; Complication of anesthesia; and Sleep apnea.    PAST SURGICAL HISTORY: Past Surgical History  Procedure Laterality Date  . Tonsillectomy    . Cholecystectomy    .  fybroid tumors    . Breast surgery    . Eye surgery      cataracts  . Hernia repair      umbilical  . Pilonidal cyst excision      buttock  . Partial mastectomy with needle localization Left 04/05/2013    Procedure: PARTIAL MASTECTOMY WITH NEEDLE LOCALIZATION;  Surgeon: Ernestene Mention, MD;  Location: [facility] OR;  Service: General;  Laterality: Left;    FAMILY HISTORY:  Family History  Problem Relation Age of Onset  . Heart failure Mother   . Gallbladder disease Father   . Pancreatic cancer Sister     SOCIAL HISTORY:  History  Substance Use Topics  . Smoking status: Former Smoker -- 0.50 packs/day for 40 years    Types: Cigarettes    Quit date: 05/30/1980  . Smokeless tobacco: Never Used  . Alcohol Use: No    ALLERGIES: Demerol; Penicillins; Phenergan; Codeine; Macrobid; and Relafen  MEDICATIONS:  Current Outpatient Prescriptions  Medication Sig Dispense Refill  . ALPRAZolam (XANAX) 0.5 MG tablet Take 0.5 mg by mouth 2 (two) times daily as needed for sleep.      Marland Kitchen aspirin EC 81 MG tablet Take 81 mg by mouth every morning.      Marland Kitchen atorvastatin (LIPITOR) 10 MG tablet Take 10 mg by mouth at bedtime.      Marland Kitchen  calcium carbonate (TUMS EX) 750 MG chewable tablet Chew 1 tablet by mouth daily as needed. For heartburn and indigestion      . cholecalciferol (VITAMIN D) 1000 UNITS tablet Take 1,000 Units by mouth every morning.      . Chromium-Cinnamon [phone] MCG-MG CAPS Take 1 tablet by mouth at bedtime.       . cyclobenzaprine (FLEXERIL) 5 MG tablet Take 5 mg by mouth 3 (three) times daily as needed for muscle spasms.      . diclofenac sodium (VOLTAREN) 1 % GEL Apply 4 g topically 2 (two) times daily as needed  (for joint pain). For joint pain      . Dietary Management Product (TOZAL PO) Take 3 tablets by mouth daily. For eyes      . docusate sodium (COLACE) 100 MG capsule Take 100-200 mg by mouth at bedtime as needed for constipation.       . fexofenadine (ALLEGRA) 180 MG tablet Take 180 mg by mouth at bedtime.      . fluticasone (FLONASE) 50 MCG/ACT nasal spray Place 2 sprays into the nose daily.       Marland Kitchen glipiZIDE (GLUCOTROL XL) 10 MG 24 hr tablet Take 10 mg by mouth every morning.      . hydroxypropyl methylcellulose (ISOPTO TEARS) 2.5 % ophthalmic solution Place 1 drop into both eyes 3 (three) times daily as needed (for dry eyes).      Marland Kitchen lisinopril (PRINIVIL,ZESTRIL) 5 MG tablet Take 5 mg by mouth daily.      . nebivolol (BYSTOLIC) 5 MG tablet Take 5 mg by mouth daily.      . pantoprazole (PROTONIX) 40 MG tablet Take 40 mg by mouth every morning.      . polyethylene glycol (MIRALAX / GLYCOLAX) packet Take 17 g by mouth daily as needed (for constipation).      . torsemide (DEMADEX) 20 MG tablet Take 20 mg by mouth daily.      . traMADol (ULTRAM) 50 MG tablet 50 mg 3 (three) times daily.        No current facility-administered medications for this encounter.    REVIEW OF SYSTEMS:  A 15 point review of systems is documented in the electronic medical record. This was obtained by the nursing staff. However, I reviewed this with the patient to discuss relevant findings and make appropriate changes.  Pertinent items are noted in HPI.  PHYSICAL EXAM:  Filed Vitals:   04/17/13 1449  BP: 143/56  Pulse: 69  Temp: 98 F (36.7 C)  Resp: 18  .288 lb (130.636 kg). She is an obese female in no distress sitting comfortably on a wheelchair. She is not able to transfer to the examining room table. I examined only her left breast to see her surgical incision as well as some edema in the lateral aspect of her left breast. There is no signs of infection. She is alert and oriented x3. She has significant lower  extremity edema bilaterally.  LABORATORY DATA:  Lab Results  Component Value Date   WBC 9.8 04/05/2013   HGB 11.7* 04/05/2013   HCT 35.3* 04/05/2013   MCV 92.4 04/05/2013   PLT 178 04/05/2013   Lab Results  Component Value Date   NA 140 04/02/2013   K 4.2 04/02/2013   CL 97 04/02/2013   CO2 32 04/02/2013   Lab Results  Component Value Date   ALT 13 04/02/2013   AST 14 04/02/2013   ALKPHOS 74 04/02/2013   BILITOT  0.4 04/02/2013     RADIOGRAPHY: Dg Chest 2 View  04/02/2013   CLINICAL DATA:  Hypertension.  EXAM: CHEST  2 VIEW  COMPARISON:  Chest radiograph 05/19/2012.  FINDINGS: Stable cardiac and mediastinal contours. Persistent pleural-based density within the left lower hemi thorax. No large consolidative pulmonary opacities. No definite pleural effusion or pneumothorax. Mid thoracic spine degenerative change. Cholecystectomy clips.  IMPRESSION: 1. No acute cardiopulmonary process. 2. 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   Electronically Signed   By: Annia Belt M.D.   On: 04/02/2013 12:07   Mm Lt Plc Breast Loc Dev   1st Lesion  Inc Mammo Guide  04/05/2013   CLINICAL DATA:  Left breast cancer. The patient presents for localization prior lumpectomy.  EXAM: NEEDLE LOCALIZATION OF THE LEFT BREAST WITH MAMMO GUIDANCE  COMPARISON:  Previous exams.  FINDINGS: Patient presents for needle localization prior to wire localization. I met with the patient and we discussed the procedure of needle localization including benefits and alternatives. We discussed the high likelihood of a successful procedure. We discussed the risks of the procedure, including infection, bleeding, tissue injury, and further surgery. Informed, written consent was given. The usual time-out protocol was performed immediately prior to the procedure.  Using mammographic guidance, sterile technique, 2% lidocaine and a 9 cm  modified Kopans needle, a mass and biopsy clip were localized using a lateral to medial approach. The films were marked for Dr. Derrell Lolling.  Specimen radiograph was performed at [facility], and confirms a mass, a biopsy clip, and intact wire present in the tissue sample. The specimen was marked for pathology.  IMPRESSION: Needle localization left breast. No apparent complications.   Electronically Signed   By: Britta Mccreedy M.D.   On: 04/05/2013 17:06      IMPRESSION: T1 C. NX invasive ductal carcinoma of the left breast status post lumpectomy  PLAN: I discussed with Makayla Rodriguez and her sister-in-law her options for treatment. We discussed the results of large randomized studies looking at the addition of radiation to antiestrogen therapy alone in patients over 70. We discussed the absence of a survival benefit. We discussed the slight benefit on the order of maybe 3 or 4% to radiation in addition antiestrogen therapy. At this point I think her limited mobility put her at risk for falls while transferring to and from her wheelchair to the treatment table. In addition she has significantly O2 dependent and may in fact experience a decrease in her pulmonary function with radiation. She also would be pushed to the limit physically to come in for treatment every day when she does not normally leave the house every day. This would be a significant strain on her given her pulmonary status, her weight, and her lower extremity edema. All these risk would not outweigh I believe this small improvement in local control that radiation could offer on top of antiestrogen therapy alone. She was actually very relieved by this decision and has discussed this with Dr. Welton Flakes as well. We discussed the need for followup imaging as well as followup with Dr. Welton Flakes. She is waiting for a prescription for antiestrogen to be called in by Dr. Welton Flakes to her pharmacy. She has a followup appointment with Dr. Welton Flakes scheduled in February. She is seeing  Dr. Derrell Lolling at the end of the week for a postoperative visit.  I spent 40 minutes  face to face with the patient and  more than 50% of that time was spent in counseling and/or coordination of care.   ------------------------------------------------  Lurline Hare, MD

## 2013-04-17 NOTE — Addendum Note (Signed)
Encounter addended by: Nalla Purdy Marie Aycen Porreca, RN on: 04/17/2013  6:12 PM<BR>     Documentation filed: Charges VN

## 2013-04-17 NOTE — Progress Notes (Signed)
Please see the Nurse Progress Note in the MD Initial Consult Encounter for this patient. 

## 2013-04-18 ENCOUNTER — Encounter: Payer: Self-pay | Admitting: *Deleted

## 2013-04-18 NOTE — Progress Notes (Signed)
Mailed after appt letter to pt. 

## 2013-04-19 ENCOUNTER — Ambulatory Visit (INDEPENDENT_AMBULATORY_CARE_PROVIDER_SITE_OTHER): Payer: Medicare Other | Admitting: General Surgery

## 2013-04-19 ENCOUNTER — Encounter (INDEPENDENT_AMBULATORY_CARE_PROVIDER_SITE_OTHER): Payer: Self-pay | Admitting: General Surgery

## 2013-04-19 VITALS — BP 105/66 | HR 68 | Temp 98.6°F | Resp 14

## 2013-04-19 DIAGNOSIS — C50419 Malignant neoplasm of upper-outer quadrant of unspecified female breast: Secondary | ICD-10-CM

## 2013-04-19 DIAGNOSIS — C50412 Malignant neoplasm of upper-outer quadrant of left female breast: Secondary | ICD-10-CM

## 2013-04-19 NOTE — Patient Instructions (Signed)
Your left breast incision is healing normally. You may bathe or shower any way you wish.  We have decided that you do not need radiation therapy, but you do need antiestrogen medication.  Please call Dr. Drue Second to get your prescription for antiestrogen medication. She will decide which one is best for you.  Return to see Dr. Derrell Lolling in 6 months.

## 2013-04-19 NOTE — Progress Notes (Signed)
Patient ID: Makayla Rodriguez, female   DOB: Oct 03, 1931, 79 y.o.   MRN: [id number] History: This patient underwent left partial mastectomy with needle localization and reexcision of superior margins on 04/05/2013. Considering the reexcision, her margins are widely negative. She has invasive ductal carcinoma, 1.6 cm diameter. Negative margins.  ER 100%, PR 0%, HER-2/neu negative. Ki-67 51%. Pathologic stage T1c, Nx..    we have discussed this in tumor board this Wednesday and the agreement was she should be placed on antiestrogen therapy. She is too disabled in a wheelchair to tolerate radiation therapy. She has seen. Dr. Drue Second. Apparently Dr. Welton Flakes is going to [facility] in a anti-estrogen prescription to her . She is having no problems with breast wound healing.  Exam: Patient ordered. No distress. In a wheelchair as usual, on oxygen Left breast wound, upper outer quadrant is healing normally. Normal amount of thickening and fullness. No infection.  Assessment: Invasive ductal carcinoma left breast, upper outer quadrant, 1.6 cm, ER 100%, PR 0, HER-2-negative, pathologic stage TI C., NX Recovering uneventfully following left partial mastectomy Oxygen-dependent sleep apnea Obesity hypoventilation syndrome Severe osteoarthritis Non-insulin-dependent diabetes mellitus Hypertension  Plan: She was instructed to contact Dr. Drue Second to get a prescription for antiestrogen medication Return to see me in 6 months Bilateral mammograms in one year.   Angelia Mould. Derrell Lolling, M.D., [facility] General and Minimally invasive Surgery Breast and Colorectal Surgery Office:   [phone] Pager:   [phone]

## 2013-04-24 NOTE — Discharge Summary (Signed)
Patient ID: Makayla Rodriguez [id number] 79 y.o. 1931-07-29  Admit date: 04/05/2013  Discharge date and time: 04/06/2013  3:35 PM  Admitting Physician: Ernestene Mention  Discharge Physician: Ernestene Mention  Admission Diagnoses: Left breast cancer  Discharge Diagnoses:  Invasive ductal carcinoma left breast, estrogen receptor 100%, progesterone receptor 0%, HER-2/neu negative. Pathologic stage TI C., NX.  Operations: Procedure(s): PARTIAL MASTECTOMY WITH NEEDLE LOCALIZATION  Admission Condition: fair  Discharged Condition: fair  Indication for Admission: Makayla Rodriguez is a 79 y.o. Female who was referred by Dr. Cain Saupe for evaluation and management of a newly diagnosed invasive ductal carcinoma left breast, upper outer quadrant, 1.4 cm, ER 100%, PR 0, HER-2-negative. Her primary care physician is Dr. Shaune Pollack. Dr. Vassie Loll is her pulmonologist.  The patient has a history of fibrocystic disease and has had a couple of breast biopsies in the past but no history of breast cancer. Recent screening mammograms show a suspicious density in the left breast, upper outer quadrant, 1.4 cm. Image guided biopsy shows invasive ductal carcinoma, ER positive, 100%. PR 0. HER-2 negative.Ki-67 31%. She has not had an MRI. We presented her case in breast conference Wed. The consensus was that it was probably better to do a partial mastectomy. No indication for node dissection or sentinel node biopsy, as that would not alter adjuvant therapies for outcome.. She might be a candidate for antiestrogen therapy, or radiation therapy, but probably not both.  She has significant comorbidities. She has oxygen-dependent sleep apnea, obesity hypoventilation syndrome, osteoarthritis, limited mobility and wheelchair dependent, non-insulin-dependent diabetes mellitus, hypertension, no history of myocardial infarction. Past history umbilical hernia repair and laparoscopic cholecystectomy.    She was admitted  postoperatively for observation because of her sleep apnea, obesity hypoventilation syndrome, and other medical problems   Hospital Course: On the day of admission the patient was taken to the operating room and underwent a left partial mastectomy with needle localization. The surgery was uneventful. She was observed overnight and had no pulmonary or cardiac problems. She had no wound problems or bleeding. She was discharged the following morning. Diet and activities were discussed. Followup arrangements with me and her medical oncologist were made.  Consults: None  Significant Diagnostic Studies: Surgical pathology  Treatments: surgery: Left partial mastectomy with needle localization  Disposition: Home  Patient Instructions:    Medication List         ALPRAZolam 0.5 MG tablet  Commonly known as:  XANAX  Take 0.5 mg by mouth 2 (two) times daily as needed for sleep.     aspirin EC 81 MG tablet  Take 81 mg by mouth every morning.     atorvastatin 10 MG tablet  Commonly known as:  LIPITOR  Take 10 mg by mouth at bedtime.     calcium carbonate 750 MG chewable tablet  Commonly known as:  TUMS EX  Chew 1 tablet by mouth daily as needed. For heartburn and indigestion     cholecalciferol 1000 UNITS tablet  Commonly known as:  VITAMIN D  Take 1,000 Units by mouth every morning.     Chromium-Cinnamon [phone] MCG-MG Caps  Take 1 tablet by mouth at bedtime.     cyclobenzaprine 5 MG tablet  Commonly known as:  FLEXERIL  Take 5 mg by mouth 3 (three) times daily as needed for muscle spasms.     diclofenac sodium 1 % Gel  Commonly known as:  VOLTAREN  Apply 4 g topically 2 (two) times daily as needed (for joint pain).  For joint pain     docusate sodium 100 MG capsule  Commonly known as:  COLACE  Take 100-200 mg by mouth at bedtime as needed for constipation.     fexofenadine 180 MG tablet  Commonly known as:  ALLEGRA  Take 180 mg by mouth at bedtime.     fluticasone 50  MCG/ACT nasal spray  Commonly known as:  FLONASE  Place 2 sprays into the nose daily.     glipiZIDE 10 MG 24 hr tablet  Commonly known as:  GLUCOTROL XL  Take 10 mg by mouth every morning.     hydroxypropyl methylcellulose 2.5 % ophthalmic solution  Commonly known as:  ISOPTO TEARS  Place 1 drop into both eyes 3 (three) times daily as needed (for dry eyes).     lisinopril 5 MG tablet  Commonly known as:  PRINIVIL,ZESTRIL  Take 5 mg by mouth daily.     nebivolol 5 MG tablet  Commonly known as:  BYSTOLIC  Take 5 mg by mouth daily.     pantoprazole 40 MG tablet  Commonly known as:  PROTONIX  Take 40 mg by mouth every morning.     polyethylene glycol packet  Commonly known as:  MIRALAX / GLYCOLAX  Take 17 g by mouth daily as needed (for constipation).     torsemide 20 MG tablet  Commonly known as:  DEMADEX  Take 20 mg by mouth daily.     TOZAL PO  Take 3 tablets by mouth daily. For eyes     traMADol 50 MG tablet  Commonly known as:  ULTRAM  50 mg 3 (three) times daily.        Activity: activity as tolerated Diet: low fat, low cholesterol diet Wound Care: none needed  Follow-up:  With Dr. Derrell Lolling in 2 weeks.  Signed: Angelia Mould. Derrell Lolling, M.D., FACS General and minimally invasive surgery Breast and Colorectal Surgery  04/24/2013, 1:22 PM

## 2013-05-03 ENCOUNTER — Encounter: Payer: Self-pay | Admitting: *Deleted

## 2013-05-03 ENCOUNTER — Telehealth: Payer: Self-pay | Admitting: Emergency Medicine

## 2013-05-03 NOTE — Telephone Encounter (Signed)
Patient called inquiring about prescription for antiestrogen therapy. Patient will not be receiving radiation tx and wanted to know if she needed to start the antiestrogen therapy now or wait until she is seen in the office with Dr Welton Flakes on 07/18/13.

## 2013-05-03 NOTE — Progress Notes (Signed)
[facility] Psychosocial Distress Screening Clinical Social Work  Clinical Social Work was referred by distress screening protocol.  The patient scored a 5 on the Psychosocial Distress Thermometer which indicates moderate distress. Clinical Social Worker phoned Pt at home to assess for distress and other psychosocial needs. She denies current concerns and feels much better about her situation since her last appointment. She reports to have good support from family and CNAs in her home that assist with cooking and ADLs for her. CSW educated Pt on [facility] resources and supports. She is very interested in the upcoming cooking classes for her CNA to attend.  No other concerns noted. Pt very much appreciated the phone call.    Clinical Social Worker follow up needed: no  Doreen Salvage, LCSW Clinical Social Worker [facility] Wednesday, Thursday and Friday Phone: [phone] Fax: [phone]

## 2013-05-03 NOTE — Telephone Encounter (Signed)
SEE ME ON 05/06/13 AT 4:30

## 2013-05-06 ENCOUNTER — Ambulatory Visit: Payer: Medicare Other | Admitting: Oncology

## 2013-05-15 DIAGNOSIS — I872 Venous insufficiency (chronic) (peripheral): Secondary | ICD-10-CM | POA: Diagnosis not present

## 2013-05-15 DIAGNOSIS — M7989 Other specified soft tissue disorders: Secondary | ICD-10-CM | POA: Diagnosis not present

## 2013-05-15 DIAGNOSIS — E119 Type 2 diabetes mellitus without complications: Secondary | ICD-10-CM | POA: Diagnosis not present

## 2013-06-29 ENCOUNTER — Encounter: Payer: Self-pay | Admitting: *Deleted

## 2013-07-03 DIAGNOSIS — E1129 Type 2 diabetes mellitus with other diabetic kidney complication: Secondary | ICD-10-CM | POA: Diagnosis not present

## 2013-07-03 DIAGNOSIS — N183 Chronic kidney disease, stage 3 unspecified: Secondary | ICD-10-CM | POA: Diagnosis not present

## 2013-07-03 DIAGNOSIS — I890 Lymphedema, not elsewhere classified: Secondary | ICD-10-CM | POA: Diagnosis not present

## 2013-07-03 DIAGNOSIS — R42 Dizziness and giddiness: Secondary | ICD-10-CM | POA: Diagnosis not present

## 2013-07-03 DIAGNOSIS — I10 Essential (primary) hypertension: Secondary | ICD-10-CM | POA: Diagnosis not present

## 2013-07-03 DIAGNOSIS — E1165 Type 2 diabetes mellitus with hyperglycemia: Secondary | ICD-10-CM | POA: Diagnosis not present

## 2013-07-18 ENCOUNTER — Ambulatory Visit: Payer: Medicare Other | Admitting: Oncology

## 2013-07-31 ENCOUNTER — Ambulatory Visit: Payer: Medicare Other | Admitting: Pulmonary Disease

## 2013-08-21 ENCOUNTER — Ambulatory Visit (INDEPENDENT_AMBULATORY_CARE_PROVIDER_SITE_OTHER): Payer: Medicare Other | Admitting: Pulmonary Disease

## 2013-08-21 ENCOUNTER — Encounter: Payer: Self-pay | Admitting: Pulmonary Disease

## 2013-08-21 VITALS — BP 124/76 | HR 75 | Temp 97.7°F | Ht 63.5 in | Wt 293.4 lb

## 2013-08-21 DIAGNOSIS — G4733 Obstructive sleep apnea (adult) (pediatric): Secondary | ICD-10-CM | POA: Diagnosis not present

## 2013-08-21 DIAGNOSIS — J948 Other specified pleural conditions: Secondary | ICD-10-CM

## 2013-08-21 DIAGNOSIS — R222 Localized swelling, mass and lump, trunk: Secondary | ICD-10-CM | POA: Diagnosis not present

## 2013-08-21 NOTE — Patient Instructions (Signed)
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

## 2013-08-21 NOTE — Progress Notes (Signed)
   Subjective:    Patient ID: Makayla Rodriguez, female    DOB: [birth year], 80 y.o.   MRN: [id number]  HPI  80 y.o MO(302 lbs) female with OSA/ OHS for FU.  She is severely limited in her ADL and cannot walk with a walker more than 10 feet. Admitted on 04-2012 with mild resp acidosis & CXR s/o edema. She was aggressive diuresed -about 15L and  Placed on 24 h O2. She has OHS by ABGs with a PH of 7.4 and PCO2 of 77 and bicarb of 47.4. She has remained O2 dependent. Ct of chest was negative for PE but did show rt diaphragm elevation and lt pleural mass.  Improved on Bipap ACE-I was dc'd & bystolic started  PSG 6/96/29 >> AHI 98.7, SpO2 low 58%. Study done with 2 liters oxygen.   CPAP.download 5/23- 11/07/12 on 7-20 auto , nasal mask>> good compliance 6h, AHI 1/h, avg pr 9 cm    08/21/2013 Remains wheelchair bound, has help at home 16hr /d .  left partial mastectomy in 02/2013 Allergies worse, c/o PND Stays off O2 sometimes , pressure ok, no dryness  Unable to exercise d/t knee problems.    No chest pain , orthopnea, increased edema, fever .  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 Liver cyst/ adrenal benign nodule - unchanged two small 6 mm nodules in the right lower lobe    Review of Systems neg for any significant sore throat, dysphagia, itching, sneezing, nasal congestion or excess/ purulent secretions, fever, chills, sweats, unintended wt loss, pleuritic or exertional cp, hempoptysis, orthopnea pnd or change in chronic leg swelling. Also denies presyncope, palpitations, heartburn, abdominal pain, nausea, vomiting, diarrhea or change in bowel or urinary habits, dysuria,hematuria, rash, arthralgias, visual complaints, headache, numbness weakness or ataxia.     Objective:   Physical Exam  Gen. Pleasant, obese, in no distress ENT - no lesions, no post nasal drip Neck: No JVD, no thyromegaly, no carotid bruits Lungs: no  use of accessory muscles, no dullness to percussion, decreased without rales or rhonchi  Cardiovascular: Rhythm regular, heart sounds  normal, no murmurs or gallops, no peripheral edema Musculoskeletal: No deformities, no cyanosis or clubbing , no tremors       Assessment & Plan:

## 2013-08-25 NOTE — Assessment & Plan Note (Signed)
Stay on CPAP & oxygen during sleep OK to take ZYRTEC instead of allegra daily  Weight loss encouraged, compliance with goal of at least 4-6 hrs every night is the expectation. Advised against medications with sedative side effects Cautioned against driving when sleepy - understanding that sleepiness will vary on a day to day basis

## 2013-08-25 NOTE — Assessment & Plan Note (Signed)
1 yr FU CT 

## 2013-09-05 ENCOUNTER — Ambulatory Visit (INDEPENDENT_AMBULATORY_CARE_PROVIDER_SITE_OTHER)
Admission: RE | Admit: 2013-09-05 | Discharge: 2013-09-05 | Disposition: A | Discharge status: Home / Self Care | Payer: Medicare Other | Source: Ambulatory Visit | Attending: Pulmonary Disease | Admitting: Pulmonary Disease

## 2013-09-05 DIAGNOSIS — R222 Localized swelling, mass and lump, trunk: Secondary | ICD-10-CM

## 2013-09-05 DIAGNOSIS — J984 Other disorders of lung: Secondary | ICD-10-CM | POA: Diagnosis not present

## 2013-09-05 DIAGNOSIS — J948 Other specified pleural conditions: Secondary | ICD-10-CM

## 2013-09-10 ENCOUNTER — Telehealth: Payer: Self-pay | Admitting: Pulmonary Disease

## 2013-09-10 NOTE — Telephone Encounter (Signed)
Spoke with pt. appt scheduled to see [place] tomorrow at 11:30. Nothing further needed

## 2013-09-11 ENCOUNTER — Ambulatory Visit (INDEPENDENT_AMBULATORY_CARE_PROVIDER_SITE_OTHER): Payer: Medicare Other | Admitting: Pulmonary Disease

## 2013-09-11 ENCOUNTER — Encounter: Payer: Self-pay | Admitting: Pulmonary Disease

## 2013-09-11 VITALS — BP 108/82 | HR 66 | Temp 97.8°F | Wt 288.0 lb

## 2013-09-11 DIAGNOSIS — J309 Allergic rhinitis, unspecified: Secondary | ICD-10-CM | POA: Insufficient documentation

## 2013-09-11 NOTE — Progress Notes (Signed)
   Subjective:    Patient ID: Makayla Rodriguez, female    DOB: [birth year], 80 y.o.   MRN: [id number]  HPI Patient comes in today for an acute sick visit. She is normally followed by Dr. Elsworth Soho for obesity hypoventilation syndrome and chronic respiratory failure.  She gives a one-week history of increased rhinorrhea and postnasal drip, nasal congestion, as well as increasing cough which leads to hoarseness. She has produced some mucus, but no purulence. She believes this is related to the worsening allergy season, and is staying on her oral antihistamine and nasal corticosteroid. She has had some increasing shortness of breath, but is unsure if her edema has worsened.  She is refusing to weight today to check for that.    Review of Systems  Constitutional: Negative for fever and unexpected weight change.  HENT: Positive for congestion, postnasal drip, rhinorrhea and sneezing. Negative for dental problem, ear pain, nosebleeds, sinus pressure and trouble swallowing.   Eyes: Negative for redness and itching.  Respiratory: Positive for cough and shortness of breath. Negative for chest tightness and wheezing.        Side pain d/t cough  Cardiovascular: Negative for palpitations and leg swelling.  Gastrointestinal: Negative for nausea and vomiting.  Genitourinary: Negative for dysuria.  Musculoskeletal: Negative for joint swelling.  Skin: Negative for rash.  Neurological: Negative for headaches.  Hematological: Does not bruise/bleed easily.  Psychiatric/Behavioral: Negative for dysphoric mood. The patient is not nervous/anxious.        Objective:   Physical Exam Morbidly obese female in no acute distress Nose with drainage noted, no purulence Oropharynx clear Neck without lymphadenopathy or thyromegaly Chest with mild basilar crackles, no wheezes or rhonchi Cardiac exam with regular rate and rhythm, 3/6 systolic murmur Lower extremities with 3+ edema, no cyanosis Alert and oriented, moves all 4  extremities.       Assessment & Plan:

## 2013-09-11 NOTE — Assessment & Plan Note (Signed)
The patient is complaining of worsening nasal congestion and rhinorrhea, as well as postnasal drip leading to increased cough and hoarseness. She is staying on her Allegra and Flonase, but I would like to try her on something a little different. She is having some increased shortness of breath, but refused to weigh to see if she has been taking on fluid. I made her aware of this, but she did not change her mind. I will change her Flonase to Dymista to see if things improve.

## 2013-09-11 NOTE — Patient Instructions (Signed)
Stay on allegra or zyrtec everyday until allergy season is over (June) Stop flonase for now, and try dymista one spray each nostril every am and pm for next few weeks to see if will improve your allergy symptoms.  Can then go back to your flonase Keep followup visit with Dr. Elsworth Soho as scheduled.

## 2013-09-28 ENCOUNTER — Emergency Department (HOSPITAL_COMMUNITY): Payer: Medicare Other

## 2013-09-28 ENCOUNTER — Encounter (HOSPITAL_COMMUNITY): Payer: Self-pay | Admitting: Emergency Medicine

## 2013-09-28 ENCOUNTER — Inpatient Hospital Stay (HOSPITAL_COMMUNITY)
Admission: EM | Admit: 2013-09-28 | Discharge: 2013-10-03 | DRG: 291 | Disposition: A | Discharge status: Home / Self Care | Payer: Medicare Other | Attending: Internal Medicine | Admitting: Internal Medicine

## 2013-09-28 DIAGNOSIS — C50419 Malignant neoplasm of upper-outer quadrant of unspecified female breast: Secondary | ICD-10-CM | POA: Diagnosis not present

## 2013-09-28 DIAGNOSIS — M129 Arthropathy, unspecified: Secondary | ICD-10-CM | POA: Diagnosis present

## 2013-09-28 DIAGNOSIS — Z880 Allergy status to penicillin: Secondary | ICD-10-CM

## 2013-09-28 DIAGNOSIS — E119 Type 2 diabetes mellitus without complications: Secondary | ICD-10-CM | POA: Diagnosis not present

## 2013-09-28 DIAGNOSIS — T380X5A Adverse effect of glucocorticoids and synthetic analogues, initial encounter: Secondary | ICD-10-CM | POA: Diagnosis present

## 2013-09-28 DIAGNOSIS — J441 Chronic obstructive pulmonary disease with (acute) exacerbation: Secondary | ICD-10-CM

## 2013-09-28 DIAGNOSIS — R0600 Dyspnea, unspecified: Secondary | ICD-10-CM | POA: Diagnosis present

## 2013-09-28 DIAGNOSIS — N183 Chronic kidney disease, stage 3 unspecified: Secondary | ICD-10-CM | POA: Diagnosis present

## 2013-09-28 DIAGNOSIS — Z800 Family history of malignant neoplasm of digestive organs: Secondary | ICD-10-CM

## 2013-09-28 DIAGNOSIS — G4733 Obstructive sleep apnea (adult) (pediatric): Secondary | ICD-10-CM

## 2013-09-28 DIAGNOSIS — J309 Allergic rhinitis, unspecified: Secondary | ICD-10-CM | POA: Diagnosis not present

## 2013-09-28 DIAGNOSIS — R0989 Other specified symptoms and signs involving the circulatory and respiratory systems: Secondary | ICD-10-CM

## 2013-09-28 DIAGNOSIS — Z885 Allergy status to narcotic agent status: Secondary | ICD-10-CM

## 2013-09-28 DIAGNOSIS — K219 Gastro-esophageal reflux disease without esophagitis: Secondary | ICD-10-CM | POA: Diagnosis present

## 2013-09-28 DIAGNOSIS — I359 Nonrheumatic aortic valve disorder, unspecified: Secondary | ICD-10-CM | POA: Diagnosis not present

## 2013-09-28 DIAGNOSIS — Z9010 Acquired absence of unspecified breast and nipple: Secondary | ICD-10-CM

## 2013-09-28 DIAGNOSIS — Z79899 Other long term (current) drug therapy: Secondary | ICD-10-CM | POA: Diagnosis not present

## 2013-09-28 DIAGNOSIS — R05 Cough: Secondary | ICD-10-CM | POA: Diagnosis not present

## 2013-09-28 DIAGNOSIS — R0609 Other forms of dyspnea: Secondary | ICD-10-CM

## 2013-09-28 DIAGNOSIS — I5031 Acute diastolic (congestive) heart failure: Secondary | ICD-10-CM | POA: Diagnosis not present

## 2013-09-28 DIAGNOSIS — J948 Other specified pleural conditions: Secondary | ICD-10-CM

## 2013-09-28 DIAGNOSIS — Z7982 Long term (current) use of aspirin: Secondary | ICD-10-CM

## 2013-09-28 DIAGNOSIS — E662 Morbid (severe) obesity with alveolar hypoventilation: Secondary | ICD-10-CM

## 2013-09-28 DIAGNOSIS — R0602 Shortness of breath: Secondary | ICD-10-CM | POA: Diagnosis not present

## 2013-09-28 DIAGNOSIS — Z8249 Family history of ischemic heart disease and other diseases of the circulatory system: Secondary | ICD-10-CM | POA: Diagnosis not present

## 2013-09-28 DIAGNOSIS — Z6841 Body Mass Index (BMI) 40.0 and over, adult: Secondary | ICD-10-CM | POA: Diagnosis not present

## 2013-09-28 DIAGNOSIS — J9600 Acute respiratory failure, unspecified whether with hypoxia or hypercapnia: Secondary | ICD-10-CM | POA: Diagnosis present

## 2013-09-28 DIAGNOSIS — Z888 Allergy status to other drugs, medicaments and biological substances status: Secondary | ICD-10-CM | POA: Diagnosis not present

## 2013-09-28 DIAGNOSIS — I10 Essential (primary) hypertension: Secondary | ICD-10-CM | POA: Diagnosis not present

## 2013-09-28 DIAGNOSIS — F411 Generalized anxiety disorder: Secondary | ICD-10-CM | POA: Diagnosis present

## 2013-09-28 DIAGNOSIS — I890 Lymphedema, not elsewhere classified: Secondary | ICD-10-CM | POA: Diagnosis present

## 2013-09-28 DIAGNOSIS — M19079 Primary osteoarthritis, unspecified ankle and foot: Secondary | ICD-10-CM

## 2013-09-28 DIAGNOSIS — I509 Heart failure, unspecified: Secondary | ICD-10-CM | POA: Diagnosis present

## 2013-09-28 DIAGNOSIS — I5033 Acute on chronic diastolic (congestive) heart failure: Secondary | ICD-10-CM | POA: Diagnosis not present

## 2013-09-28 DIAGNOSIS — Z87891 Personal history of nicotine dependence: Secondary | ICD-10-CM

## 2013-09-28 DIAGNOSIS — I129 Hypertensive chronic kidney disease with stage 1 through stage 4 chronic kidney disease, or unspecified chronic kidney disease: Secondary | ICD-10-CM | POA: Diagnosis present

## 2013-09-28 DIAGNOSIS — R059 Cough, unspecified: Secondary | ICD-10-CM | POA: Diagnosis not present

## 2013-09-28 LAB — CBC WITH DIFFERENTIAL/PLATELET
Basophils Absolute: 0 10*3/uL (ref 0.0–0.1)
Basophils Relative: 0 % (ref 0–1)
EOS ABS: 0 10*3/uL (ref 0.0–0.7)
EOS PCT: 0 % (ref 0–5)
HCT: 41.1 % (ref 36.0–46.0)
Hemoglobin: 13 g/dL (ref 12.0–15.0)
Lymphocytes Relative: 13 % (ref 12–46)
Lymphs Abs: 2.1 10*3/uL (ref 0.7–4.0)
MCH: 29.9 pg (ref 26.0–34.0)
MCHC: 31.6 g/dL (ref 30.0–36.0)
MCV: 94.5 fL (ref 78.0–100.0)
MONO ABS: 1.8 10*3/uL — AB (ref 0.1–1.0)
Monocytes Relative: 11 % (ref 3–12)
NEUTROS PCT: 75 % (ref 43–77)
Neutro Abs: 11.6 10*3/uL — ABNORMAL HIGH (ref 1.7–7.7)
Platelets: 240 10*3/uL (ref 150–400)
RBC: 4.35 MIL/uL (ref 3.87–5.11)
RDW: 12.7 % (ref 11.5–15.5)
WBC: 15.5 10*3/uL — ABNORMAL HIGH (ref 4.0–10.5)

## 2013-09-28 LAB — CBC
HEMATOCRIT: 38.6 % (ref 36.0–46.0)
Hemoglobin: 12.2 g/dL (ref 12.0–15.0)
MCH: 29.5 pg (ref 26.0–34.0)
MCHC: 31.6 g/dL (ref 30.0–36.0)
MCV: 93.2 fL (ref 78.0–100.0)
Platelets: 221 10*3/uL (ref 150–400)
RBC: 4.14 MIL/uL (ref 3.87–5.11)
RDW: 12.8 % (ref 11.5–15.5)
WBC: 12.5 10*3/uL — ABNORMAL HIGH (ref 4.0–10.5)

## 2013-09-28 LAB — GLUCOSE, CAPILLARY
GLUCOSE-CAPILLARY: 280 mg/dL — AB (ref 70–99)
GLUCOSE-CAPILLARY: 216 mg/dL — AB (ref 70–99)
Glucose-Capillary: 329 mg/dL — ABNORMAL HIGH (ref 70–99)
Glucose-Capillary: 258 mg/dL — ABNORMAL HIGH (ref 70–99)

## 2013-09-28 LAB — CREATININE, SERUM
Creatinine, Ser: 0.93 mg/dL (ref 0.50–1.10)
GFR calc Af Amer: 66 mL/min — ABNORMAL LOW (ref >90)
GFR calc non Af Amer: 57 mL/min — ABNORMAL LOW (ref >90)

## 2013-09-28 LAB — HEMOGLOBIN A1C
Hgb A1c MFr Bld: 8.6 % — ABNORMAL HIGH (ref <5.7)
Mean Plasma Glucose: 200 mg/dL — ABNORMAL HIGH (ref <117)

## 2013-09-28 LAB — PRO B NATRIURETIC PEPTIDE: PRO B NATRI PEPTIDE: 1416 pg/mL — AB (ref 0–450)

## 2013-09-28 LAB — BASIC METABOLIC PANEL
BUN: 34 mg/dL — ABNORMAL HIGH (ref 6–23)
CHLORIDE: 93 meq/L — AB (ref 96–112)
CO2: 34 mEq/L — ABNORMAL HIGH (ref 19–32)
Calcium: 9.9 mg/dL (ref 8.4–10.5)
Creatinine, Ser: 0.91 mg/dL (ref 0.50–1.10)
GFR calc Af Amer: 68 mL/min — ABNORMAL LOW (ref >90)
GFR calc non Af Amer: 58 mL/min — ABNORMAL LOW (ref >90)
Glucose, Bld: 174 mg/dL — ABNORMAL HIGH (ref 70–99)
POTASSIUM: 3.9 meq/L (ref 3.7–5.3)
Sodium: 140 mEq/L (ref 137–147)

## 2013-09-28 LAB — TROPONIN I
Troponin I: <0.3 ng/mL (ref <0.30)
Troponin I: <0.3 ng/mL (ref <0.30)
Troponin I: <0.3 ng/mL (ref <0.30)

## 2013-09-28 MED ORDER — ENOXAPARIN SODIUM 40 MG/0.4ML ~~LOC~~ SOLN
40.0000 mg | SUBCUTANEOUS | Status: DC
  Administered 2013-09-28 – 2013-10-03 (×6): 40 mg via SUBCUTANEOUS
  Filled 2013-09-28 (×6): qty 0.4

## 2013-09-28 MED ORDER — INSULIN ASPART 100 UNIT/ML ~~LOC~~ SOLN
0.0000 [IU] | Freq: Every day | SUBCUTANEOUS | Status: DC
Start: 2013-09-28 — End: 2013-10-03
  Administered 2013-09-28 – 2013-09-30 (×2): 2 [IU] via SUBCUTANEOUS
  Administered 2013-10-01 – 2013-10-02 (×2): 3 [IU] via SUBCUTANEOUS

## 2013-09-28 MED ORDER — HYDROCODONE-ACETAMINOPHEN 5-325 MG PO TABS: 1.0000 | ORAL_TABLET | ORAL | Status: DC | PRN

## 2013-09-28 MED ORDER — GUAIFENESIN ER 600 MG PO TB12
600.0000 mg | ORAL_TABLET | Freq: Two times a day (BID) | ORAL | Status: DC
  Administered 2013-09-28 (×2): 600 mg via ORAL
  Filled 2013-09-28 (×4): qty 1

## 2013-09-28 MED ORDER — ACETAMINOPHEN 650 MG RE SUPP: 650.0000 mg | Freq: Four times a day (QID) | RECTAL | Status: DC | PRN

## 2013-09-28 MED ORDER — ONDANSETRON HCL 4 MG/2ML IJ SOLN: 4.0000 mg | Freq: Four times a day (QID) | INTRAMUSCULAR | Status: DC | PRN

## 2013-09-28 MED ORDER — TORSEMIDE 20 MG PO TABS
20.0000 mg | ORAL_TABLET | Freq: Two times a day (BID) | ORAL | Status: DC
  Administered 2013-09-28 – 2013-09-30 (×5): 20 mg via ORAL
  Filled 2013-09-28 (×7): qty 1

## 2013-09-28 MED ORDER — FLUTICASONE PROPIONATE 50 MCG/ACT NA SUSP
2.0000 | Freq: Every day | NASAL | Status: DC
  Administered 2013-09-28 – 2013-10-03 (×6): 2 via NASAL
  Filled 2013-09-28: qty 16

## 2013-09-28 MED ORDER — METHYLPREDNISOLONE SODIUM SUCC 125 MG IJ SOLR
60.0000 mg | INTRAMUSCULAR | Status: DC
  Administered 2013-09-28 – 2013-09-29 (×2): 60 mg via INTRAVENOUS
  Filled 2013-09-28 (×3): qty 0.96

## 2013-09-28 MED ORDER — ACETAMINOPHEN 325 MG PO TABS
650.0000 mg | ORAL_TABLET | Freq: Four times a day (QID) | ORAL | Status: DC | PRN
  Administered 2013-09-28 – 2013-10-01 (×2): 650 mg via ORAL
  Filled 2013-09-28 (×2): qty 2

## 2013-09-28 MED ORDER — ALBUTEROL (5 MG/ML) CONTINUOUS INHALATION SOLN
10.0000 mg/h | INHALATION_SOLUTION | Freq: Once | RESPIRATORY_TRACT | Status: AC
  Administered 2013-09-28: 10 mg/h via RESPIRATORY_TRACT
  Filled 2013-09-28: qty 20

## 2013-09-28 MED ORDER — VITAMINS A & D EX OINT
TOPICAL_OINTMENT | CUTANEOUS | Status: AC
  Filled 2013-09-28: qty 10

## 2013-09-28 MED ORDER — PANTOPRAZOLE SODIUM 40 MG PO TBEC
40.0000 mg | DELAYED_RELEASE_TABLET | Freq: Every morning | ORAL | Status: DC
  Administered 2013-09-28 – 2013-10-03 (×6): 40 mg via ORAL
  Filled 2013-09-28 (×6): qty 1

## 2013-09-28 MED ORDER — DOCUSATE SODIUM 100 MG PO CAPS
100.0000 mg | ORAL_CAPSULE | Freq: Two times a day (BID) | ORAL | Status: DC
  Administered 2013-09-28 – 2013-10-03 (×8): 100 mg via ORAL
  Filled 2013-09-28 (×12): qty 1

## 2013-09-28 MED ORDER — METHYLPREDNISOLONE SODIUM SUCC 125 MG IJ SOLR
125.0000 mg | Freq: Once | INTRAMUSCULAR | Status: AC
  Administered 2013-09-28: 125 mg via INTRAVENOUS
  Filled 2013-09-28: qty 2

## 2013-09-28 MED ORDER — LEVOFLOXACIN IN D5W 750 MG/150ML IV SOLN
750.0000 mg | Freq: Once | INTRAVENOUS | Status: AC
  Administered 2013-09-28: 750 mg via INTRAVENOUS
  Filled 2013-09-28: qty 150

## 2013-09-28 MED ORDER — GLIPIZIDE ER 10 MG PO TB24
10.0000 mg | ORAL_TABLET | Freq: Every day | ORAL | Status: DC
  Administered 2013-09-28 – 2013-09-29 (×2): 10 mg via ORAL
  Filled 2013-09-28 (×3): qty 1

## 2013-09-28 MED ORDER — LEVOFLOXACIN IN D5W 750 MG/150ML IV SOLN
750.0000 mg | INTRAVENOUS | Status: DC
  Administered 2013-09-29 – 2013-10-02 (×4): 750 mg via INTRAVENOUS
  Filled 2013-09-28 (×4): qty 150

## 2013-09-28 MED ORDER — ACETAMINOPHEN 325 MG PO TABS
650.0000 mg | ORAL_TABLET | Freq: Once | ORAL | Status: AC
  Administered 2013-09-28: 650 mg via ORAL
  Filled 2013-09-28: qty 2

## 2013-09-28 MED ORDER — SODIUM CHLORIDE 0.9 % IJ SOLN
3.0000 mL | Freq: Two times a day (BID) | INTRAMUSCULAR | Status: DC
  Administered 2013-09-29 – 2013-10-03 (×2): 3 mL via INTRAVENOUS

## 2013-09-28 MED ORDER — INSULIN ASPART 100 UNIT/ML ~~LOC~~ SOLN
0.0000 [IU] | Freq: Three times a day (TID) | SUBCUTANEOUS | Status: DC
Start: 2013-09-28 — End: 2013-10-03
  Administered 2013-09-28: 11 [IU] via SUBCUTANEOUS
  Administered 2013-09-28 (×2): 8 [IU] via SUBCUTANEOUS
  Administered 2013-09-29 (×2): 5 [IU] via SUBCUTANEOUS
  Administered 2013-09-30: 2 [IU] via SUBCUTANEOUS
  Administered 2013-09-30 (×2): 8 [IU] via SUBCUTANEOUS
  Administered 2013-10-01: 5 [IU] via SUBCUTANEOUS
  Administered 2013-10-01: 11 [IU] via SUBCUTANEOUS
  Administered 2013-10-01: 8 [IU] via SUBCUTANEOUS
  Administered 2013-10-02: 3 [IU] via SUBCUTANEOUS
  Administered 2013-10-02: 15 [IU] via SUBCUTANEOUS
  Administered 2013-10-02: 5 [IU] via SUBCUTANEOUS
  Administered 2013-10-03: 2 [IU] via SUBCUTANEOUS

## 2013-09-28 MED ORDER — NEBIVOLOL HCL 5 MG PO TABS
5.0000 mg | ORAL_TABLET | Freq: Every day | ORAL | Status: DC
  Administered 2013-09-28 – 2013-10-03 (×6): 5 mg via ORAL
  Filled 2013-09-28 (×6): qty 1

## 2013-09-28 MED ORDER — SODIUM CHLORIDE 0.9 % IJ SOLN: 3.0000 mL | INTRAMUSCULAR | Status: DC | PRN

## 2013-09-28 MED ORDER — FUROSEMIDE 10 MG/ML IJ SOLN
40.0000 mg | Freq: Once | INTRAMUSCULAR | Status: AC
  Administered 2013-09-28: 40 mg via INTRAVENOUS
  Filled 2013-09-28: qty 4

## 2013-09-28 MED ORDER — POLYVINYL ALCOHOL 1.4 % OP SOLN
1.0000 [drp] | Freq: Three times a day (TID) | OPHTHALMIC | Status: DC | PRN
  Administered 2013-09-28: 1 [drp] via OPHTHALMIC
  Filled 2013-09-28 (×2): qty 15

## 2013-09-28 MED ORDER — TRAMADOL HCL 50 MG PO TABS
50.0000 mg | ORAL_TABLET | Freq: Three times a day (TID) | ORAL | Status: DC | PRN
  Administered 2013-09-28 – 2013-10-02 (×6): 50 mg via ORAL
  Filled 2013-09-28 (×5): qty 1

## 2013-09-28 MED ORDER — ALPRAZOLAM 0.25 MG PO TABS: 0.2500 mg | ORAL_TABLET | Freq: Two times a day (BID) | ORAL | Status: DC | PRN

## 2013-09-28 MED ORDER — POLYETHYLENE GLYCOL 3350 17 G PO PACK
17.0000 g | PACK | Freq: Every day | ORAL | Status: DC | PRN
  Administered 2013-09-30: 17 g via ORAL
  Filled 2013-09-28 (×2): qty 1

## 2013-09-28 MED ORDER — ALBUTEROL SULFATE (2.5 MG/3ML) 0.083% IN NEBU: 2.5000 mg | INHALATION_SOLUTION | RESPIRATORY_TRACT | Status: DC | PRN

## 2013-09-28 MED ORDER — BENZONATATE 100 MG PO CAPS
100.0000 mg | ORAL_CAPSULE | Freq: Three times a day (TID) | ORAL | Status: DC | PRN
  Administered 2013-09-28: 100 mg via ORAL
  Filled 2013-09-28: qty 1

## 2013-09-28 MED ORDER — ASPIRIN EC 81 MG PO TBEC
81.0000 mg | DELAYED_RELEASE_TABLET | Freq: Every morning | ORAL | Status: DC
  Administered 2013-09-28 – 2013-10-03 (×6): 81 mg via ORAL
  Filled 2013-09-28 (×6): qty 1

## 2013-09-28 MED ORDER — GUAIFENESIN-DM 100-10 MG/5ML PO SYRP: 5.0000 mL | ORAL_SOLUTION | Freq: Four times a day (QID) | ORAL | Status: DC | PRN

## 2013-09-28 MED ORDER — SODIUM CHLORIDE 0.9 % IV SOLN: 250.0000 mL | INTRAVENOUS | Status: DC | PRN

## 2013-09-28 MED ORDER — IPRATROPIUM BROMIDE 0.02 % IN SOLN
0.5000 mg | Freq: Four times a day (QID) | RESPIRATORY_TRACT | Status: DC
  Administered 2013-09-28 – 2013-09-29 (×7): 0.5 mg via RESPIRATORY_TRACT
  Filled 2013-09-28 (×7): qty 2.5

## 2013-09-28 MED ORDER — ONDANSETRON HCL 4 MG PO TABS: 4.0000 mg | ORAL_TABLET | Freq: Four times a day (QID) | ORAL | Status: DC | PRN

## 2013-09-28 MED ORDER — LORATADINE 10 MG PO TABS
10.0000 mg | ORAL_TABLET | Freq: Every day | ORAL | Status: DC
  Administered 2013-09-28 – 2013-10-03 (×6): 10 mg via ORAL
  Filled 2013-09-28 (×6): qty 1

## 2013-09-28 MED ORDER — SODIUM CHLORIDE 0.9 % IJ SOLN
3.0000 mL | Freq: Two times a day (BID) | INTRAMUSCULAR | Status: DC
  Administered 2013-09-28 – 2013-10-02 (×9): 3 mL via INTRAVENOUS

## 2013-09-28 MED ORDER — LISINOPRIL 5 MG PO TABS
5.0000 mg | ORAL_TABLET | Freq: Every day | ORAL | Status: DC
  Administered 2013-09-28: 5 mg via ORAL
  Filled 2013-09-28 (×2): qty 1

## 2013-09-28 NOTE — ED Notes (Signed)
Pt reports productive cough since 4/10.  Have been taking tessalon pearls without relief.  Started to have severe SOB tonight.  Pt is A&Ox 4.  Has home O2.

## 2013-09-28 NOTE — ED Notes (Signed)
Productive cough and SOB

## 2013-09-28 NOTE — Progress Notes (Signed)
[facility] PROGRESS NOTE  Makayla Rodriguez NAT:[id number] DOB: 1932/02/03 DOA: 09/28/2013 PCP: Marjorie Smolder, MD  Assessment/Plan: Acute respiratory failure probably secondary to copd exacerbation and CHF ACUTE on chronic diastolic heart failure.  -  On IV solumedrol, and she received one dose os IV LASIX.  - resume bronchodilators and torsemide. levaquin for bronchitis.  - daily weights and intake and output. Serial troponins negative. And echocardiogram pending.  hgba1c is 8.6 - CXR does not show pneumonia   Diabetes mellitus: -resume glipizide and SSI.   Hypertension: - controlled.   OSA:  ON CPAP at night.   DVT prophylaxis.  -  Code Status: full code Family Communication: family and caregiver at bedside Disposition Plan: pending further investigation and care.    Consultants:  none  Procedures:  echo  Antibiotics:  levaquin  HPI/Subjective: Feel much better than yesterday .  Objective: Filed Vitals:   09/28/13 1355  BP: 136/55  Pulse: 111  Temp: 97.9 F (36.6 C)  Resp: 18    Intake/Output Summary (Last 24 hours) at 09/28/13 1631 Last data filed at 09/28/13 1356  Gross per 24 hour  Intake    720 ml  Output      0 ml  Net    720 ml   Filed Weights   09/28/13 0533  Weight: 129.819 kg (286 lb 3.2 oz)    Exam:   General:  Alert afebrile comfortable  Cardiovascular: s1s2 tachycardic.   Respiratory: ctab  Abdomen: soft NT ND BS+  Musculoskeletal: no pedal edema.  Data Reviewed: Basic Metabolic Panel:  Recent Labs Lab 09/28/13 0147 09/28/13 0712  NA 140  --   K 3.9  --   CL 93*  --   CO2 34*  --   GLUCOSE 174*  --   BUN 34*  --   CREATININE 0.91 0.93  CALCIUM 9.9  --    Liver Function Tests: No results found for this basename: AST, ALT, ALKPHOS, BILITOT, PROT, ALBUMIN,  in the last 168 hours No results found for this basename: LIPASE, AMYLASE,  in the last 168 hours No results found for this basename: AMMONIA,  in  the last 168 hours CBC:  Recent Labs Lab 09/28/13 0147 09/28/13 0712  WBC 15.5* 12.5*  NEUTROABS 11.6*  --   HGB 13.0 12.2  HCT 41.1 38.6  MCV 94.5 93.2  PLT 240 221   Cardiac Enzymes:  Recent Labs Lab 09/28/13 0147 09/28/13 0712 09/28/13 1252  TROPONINI <0.30 <0.30 <0.30   BNP (last 3 results)  Recent Labs  09/28/13 0147  PROBNP 1416.0*   CBG:  Recent Labs Lab 09/28/13 0731 09/28/13 1121  GLUCAP 280* 329*    No results found for this or any previous visit (from the past 240 hour(s)).   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  Increased interstitial markings, favored to be chronic. No pleural effusion or pneumothorax.  Mild cardiomegaly.  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  No evidence of acute cardiopulmonary disease.   Electronically Signed   By: Julian Hy M.D.   On: 09/28/2013 01:17  Scheduled Meds: . aspirin EC  81 mg Oral q morning - 10a  . docusate sodium  100 mg Oral BID  . enoxaparin (LOVENOX) injection  40 mg Subcutaneous Q24H  . fluticasone  2 spray Each Nare Daily  . glipiZIDE  10 mg Oral QAC breakfast  . guaiFENesin  600 mg Oral BID  . insulin aspart  0-15 Units Subcutaneous TID WC  . insulin aspart  0-5 Units Subcutaneous QHS  . ipratropium  0.5 mg Nebulization Q6H  . [START ON 09/29/2013] levofloxacin (LEVAQUIN) IV  750 mg Intravenous Q24H  . lisinopril  5 mg Oral Daily  . loratadine  10 mg Oral Daily  . methylPREDNISolone (SOLU-MEDROL) injection  60 mg Intravenous Q24H  . nebivolol  5 mg Oral Daily  . pantoprazole  40 mg Oral q morning - 10a  .  sodium chloride  3 mL Intravenous Q12H  . sodium chloride  3 mL Intravenous Q12H  . torsemide  20 mg Oral BID   Continuous Infusions:   Active Problems:   Acute diastolic heart failure, NYHA class 2   Diabetes mellitus   Hypertension   OSA (obstructive sleep apnea)   Cancer of upper-outer quadrant of female breast   Dyspnea   COPD exacerbation    Time spent: 35 min    Hosie Poisson  [facility] Pager [phone]. If 7PM-7AM, please contact night-coverage at [email or web address], password [facility] 09/28/2013, 4:31 PM  LOS: 0 days

## 2013-09-28 NOTE — H&P (Signed)
PCP:  Hollice Espy, MD    Chief Complaint:  Cough and shortness of breath  HPI: Makayla Rodriguez is a 80 y.o. female   has a past medical history of Diabetes mellitus without complication; Hypertension; Anxiety; Arthritis; Obesity; CKD (chronic kidney disease), stage III; COPD (chronic obstructive pulmonary disease); Shortness of breath; CHF (congestive heart failure); GERD (gastroesophageal reflux disease); Seasonal allergies; Complication of anesthesia; and Sleep apnea.   Presented with  2 week hx of worsening cough productive of white thick phlegm. Worsening shortness of breath. For the past few days she have had subjective fever and in ER up to 100.7. CXR did not show any acute changes. Denies any muscle aches. Patient is on 2 L of oxygen at baseline with #l at night with CPAP.  Patient states she stopped taking her Torsemide for few days because it was making her urinate too much.  In ER shew as given nebulizer treatment with some improvement.  She has hx of recently diagnosed localized breast Ca sp partial mastectomy. Followed by Dr. Welton Flakes   Hospitalist was called for admission for COPD exacerbation vs early PNA  Review of Systems:    Pertinent positives include:  Fevers, chills, shortness of breath at rest.  dyspnea on exertion,  Constitutional:  No weight loss, night sweats,  fatigue, weight loss  HEENT:  No headaches, Difficulty swallowing,Tooth/dental problems,Sore throat,  No sneezing, itching, ear ache, nasal congestion, post nasal drip,  Cardio-vascular:  No chest pain, Orthopnea, PND, anasarca, dizziness, palpitations.no Bilateral lower extremity swelling  GI:  No heartburn, indigestion, abdominal pain, nausea, vomiting, diarrhea, change in bowel habits, loss of appetite, melena, blood in stool, hematemesis Resp:  no  No excess mucus, no productive cough, No non-productive cough, No coughing up of blood.No change in color of mucus.No wheezing. Skin:  no rash or  lesions. No jaundice GU:  no dysuria, change in color of urine, no urgency or frequency. No straining to urinate.  No flank pain.  Musculoskeletal:  No joint pain or no joint swelling. No decreased range of motion. No back pain.  Psych:  No change in mood or affect. No depression or anxiety. No memory loss.  Neuro: no localizing neurological complaints, no tingling, no weakness, no double vision, no gait abnormality, no slurred speech, no confusion  Otherwise ROS are negative except for above, 10 systems were reviewed  Past Medical History: Past Medical History  Diagnosis Date  . Diabetes mellitus without complication   . Hypertension   . Anxiety   . Arthritis   . Obesity   . CKD (chronic kidney disease), stage III   . COPD (chronic obstructive pulmonary disease)     uses O2 at home  . Shortness of breath     with annxiety attack  . CHF (congestive heart failure)     12/13 admission  . GERD (gastroesophageal reflux disease)   . Seasonal allergies   . Complication of anesthesia     O2 sat drop during  and went into coma  . Sleep apnea     CPAP   Past Surgical History  Procedure Laterality Date  . Tonsillectomy    . Cholecystectomy    .  fybroid tumors    . Breast surgery    . Eye surgery      cataracts  . Hernia repair      umbilical  . Pilonidal cyst excision      buttock  . Partial mastectomy with needle localization Left 04/05/2013  Procedure: PARTIAL MASTECTOMY WITH NEEDLE LOCALIZATION;  Surgeon: Adin Hector, MD;  Location: [facility];  Service: General;  Laterality: Left;     Medications: Prior to Admission medications   Medication Sig Start Date End Date Taking? Authorizing Provider  acetaminophen (TYLENOL) 500 MG tablet Take 500 mg by mouth every 6 (six) hours as needed.   Yes Historical Provider, MD  ALPRAZolam Duanne Moron) 0.5 MG tablet Take 0.25 mg by mouth 2 (two) times daily as needed for sleep.    Yes Historical Provider, MD  aspirin EC 81 MG tablet  Take 81 mg by mouth every morning.   Yes Historical Provider, MD  cholecalciferol (VITAMIN D) 1000 UNITS tablet Take 1,000 Units by mouth every morning.   Yes Historical Provider, MD  Chromium-Cinnamon [phone] MCG-MG CAPS Take 1 tablet by mouth at bedtime.    Yes Historical Provider, MD  Dextromethorphan-Guaifenesin (TUSSIN DM PO) Take 5 mLs by mouth as needed (for cough).    Yes Historical Provider, MD  diclofenac sodium (VOLTAREN) 1 % GEL Apply 4 g topically 2 (two) times daily as needed (for joint pain). For joint pain   Yes Historical Provider, MD  Dietary Management Product (TOZAL PO) Take 3 tablets by mouth daily. For eyes   Yes Historical Provider, MD  docusate sodium (COLACE) 100 MG capsule Take 100-200 mg by mouth at bedtime as needed for constipation.    Yes Historical Provider, MD  fexofenadine (ALLEGRA) 180 MG tablet Take 180 mg by mouth at bedtime.   Yes Historical Provider, MD  fluticasone (FLONASE) 50 MCG/ACT nasal spray Place 2 sprays into the nose daily.    Yes Historical Provider, MD  glipiZIDE (GLUCOTROL XL) 10 MG 24 hr tablet Take 10 mg by mouth every morning.   Yes Historical Provider, MD  hydroxypropyl methylcellulose (ISOPTO TEARS) 2.5 % ophthalmic solution Place 1 drop into both eyes 3 (three) times daily as needed (for dry eyes).   Yes Historical Provider, MD  lisinopril (PRINIVIL,ZESTRIL) 5 MG tablet Take 5 mg by mouth daily.   Yes Historical Provider, MD  nebivolol (BYSTOLIC) 5 MG tablet Take 5 mg by mouth daily. 05/20/12  Yes Barton Dubois, MD  pantoprazole (PROTONIX) 40 MG tablet Take 40 mg by mouth every morning.   Yes Historical Provider, MD  polyethylene glycol (MIRALAX / GLYCOLAX) packet Take 17 g by mouth daily as needed (for constipation).   Yes Historical Provider, MD  torsemide (DEMADEX) 20 MG tablet Take 20 mg by mouth daily.   Yes Historical Provider, MD  traMADol (ULTRAM) 50 MG tablet Take 50 mg by mouth 3 (three) times daily as needed (for pain.).  05/20/12   Yes Barton Dubois, MD    Allergies:   Allergies  Allergen Reactions  . Demerol [Meperidine] Anaphylaxis    Reaction: Hypoventilation after surgery  . Penicillins Anaphylaxis  . Phenergan [Promethazine Hcl] Anaphylaxis    Reaction: hypoventilation  . Codeine Nausea And Vomiting  . Macrobid [Nitrofurantoin] Nausea And Vomiting  . Relafen [Nabumetone] Itching and Swelling    Social History:  Ambulatory  walker   Lives at home alone with caretaker   reports that she quit smoking about 33 years ago. Her smoking use included Cigarettes. She has a 20 pack-year smoking history. She has never used smokeless tobacco. She reports that she does not drink alcohol or use illicit drugs.    Family History: family history includes Gallbladder disease in her father; Heart failure in her mother; Pancreatic cancer in her sister.  Physical Exam: Patient Vitals for the past 24 hrs:  BP Temp Temp src Pulse Resp SpO2  09/28/13 0400 127/78 mmHg - - 117 - 92 %  09/28/13 0330 115/64 mmHg - - 119 - 93 %  09/28/13 0312 103/65 mmHg 100.7 F (38.2 C) Rectal 124 22 94 %  09/28/13 0300 103/65 mmHg - - 118 - 100 %  09/28/13 0208 - - - - - 92 %  09/28/13 0200 124/67 mmHg - - 121 24 92 %  09/28/13 0135 - 99.4 F (37.4 C) Oral - - -  09/28/13 0029 138/75 mmHg - - 121 18 100 %    1. General:  in No Acute distress 2. Psychological: Alert and  Oriented 3. Head/ENT:   Moist   Mucous Membranes                          Head Non traumatic, neck supple                          Normal   Dentition 4. SKIN: normal   Skin turgor,  Skin clean Dry and intact no rash 5. Heart: Regular rate and rhythm no Murmur, Rub or gallop 6. Lungs: occasional wheezes some crackles   7. Abdomen: Soft, non-tender, Non distended, obese 8. Lower extremities: no clubbing, cyanosis, or edema, obese 9. Neurologically Grossly intact, moving all 4 extremities equally 10. MSK: Normal range of motion  body mass index is unknown  because there is no weight on file.   Labs on Admission:   Recent Labs  09/28/13 0147  NA 140  K 3.9  CL 93*  CO2 34*  GLUCOSE 174*  BUN 34*  CREATININE 0.91  CALCIUM 9.9   No results found for this basename: AST, ALT, ALKPHOS, BILITOT, PROT, ALBUMIN,  in the last 72 hours No results found for this basename: LIPASE, AMYLASE,  in the last 72 hours  Recent Labs  09/28/13 0147  WBC 15.5*  NEUTROABS 11.6*  HGB 13.0  HCT 41.1  MCV 94.5  PLT 240    Recent Labs  09/28/13 0147  TROPONINI <0.30   No results found for this basename: TSH, T4TOTAL, FREET3, T3FREE, THYROIDAB,  in the last 72 hours No results found for this basename: VITAMINB12, FOLATE, FERRITIN, TIBC, IRON, RETICCTPCT,  in the last 72 hours Lab Results  Component Value Date   HGBA1C 7.7* 12020-02-412    The CrCl is unknown because both a height and weight (above a minimum accepted value) are required for this calculation. ABG    Component Value Date/Time   PHART 7.486* 05/16/2012 1301   HCO3 42.7* 05/16/2012 1301   TCO2 37.3 05/16/2012 1301   O2SAT 91.5 05/16/2012 1301     No results found for this basename: DDIMER     Other results:    BNP (last 3 results)  Recent Labs  09/28/13 0147  PROBNP 1416.0*    There were no vitals filed for this visit.   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  Increased interstitial markings, favored to be chronic. No pleural effusion or  pneumothorax.  Mild cardiomegaly.  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  No evidence of acute cardiopulmonary disease.   Electronically Signed   By: Julian Hy M.D.   On: 09/28/2013 01:17    Chart has been reviewed  Assessment/Plan  80 year old female with history of diastolic heart failure here with dyspnea and cough and low-grade fever worrisome for COPD exacerbation versus early pneumonia. Patient had not been compliant with her torsemide  Present on Admission:  . Dyspnea multifactorial possible combination of COPD versus mild CHF will continue nebulizer treatments continue on Levaquin cycle cardiac enzymes obtain echo gram increased dose of torsemide to twice daily and watch urine output  . Acute diastolic heart failure, NYHA class 2 increased dose of torsemide to twice daily continue lisinopril obtain echo gram  . Diabetes mellitus continue glipizide and order sliding scale  . Hypertension continue home medications  . OSA (obstructive sleep apnea) growth for CPAP machine with Freet features of bleed in oxygen  . Cancer of upper-outer quadrant of female breast this was localized lesion requiring lumpectomy no chemotherapy or radiation therapy  . COPD exacerbation we'll continue home oxygen and titrate as needed, Levaquin, albuterol and Atrovent nebulizers, Solu-Medrol 60 daily  Prophylaxis: Lovenox, Protonix  CODE STATUS:  FULL CODE   Other plan as per orders.  I have spent a total of 55 min on this admission  Kashauna Celmer 09/28/2013, 4:17 AM

## 2013-09-28 NOTE — ED Provider Notes (Signed)
CSN: [id number]     Arrival date & time 09/28/13  0026 History   First MD Initiated Contact with Patient 09/28/13 0036     Chief Complaint  Patient presents with  . Shortness of Breath  . Cough     (Consider location/radiation/quality/duration/timing/severity/associated sxs/prior Treatment) HPI 80 yo female presents to the ER from home via EMS with complaint of worsening cough, congestion, and shortness of breath.  Pt has h/o DM, OSA, morbid obesity, CHF, breast CA s/p partial mastectomy and COPD (although she denies this).  Pt is on 3L at home.  She reports since having CT scan of her chest on 4/10 she has had increasing amounts of nasal congestion, mucous production, cough.  Pt was receiving CT scan for monitoring of left lung mass.  Tonight since 11 pm has had worsening SOB.  Pt received two neb tx from EMS with some improvement.  SOB worse with lying flat.  She is unsure if she has gained weight or if swelling in legs/abd has worsened.  She reports subjective fevers/chills today, temp of 99.8.  Pt reports her normal temp is 97.1  Pt was seen in pulmonology clinic on 4/15 by Dr Gwenette Greet, started on [place].  By his note, he was wanting to check weights given her dyspnea, but she refused.  Past Medical History  Diagnosis Date  . Diabetes mellitus without complication   . Hypertension   . Anxiety   . Arthritis   . Obesity   . CKD (chronic kidney disease), stage III   . COPD (chronic obstructive pulmonary disease)     uses O2 at home  . Shortness of breath     with annxiety attack  . CHF (congestive heart failure)     12/13 admission  . GERD (gastroesophageal reflux disease)   . Seasonal allergies   . Complication of anesthesia     O2 sat drop during  and went into coma  . Sleep apnea     CPAP   Past Surgical History  Procedure Laterality Date  . Tonsillectomy    . Cholecystectomy    .  fybroid tumors    . Breast surgery    . Eye surgery      cataracts  . Hernia repair     umbilical  . Pilonidal cyst excision      buttock  . Partial mastectomy with needle localization Left 04/05/2013    Procedure: PARTIAL MASTECTOMY WITH NEEDLE LOCALIZATION;  Surgeon: Adin Hector, MD;  Location: [facility];  Service: General;  Laterality: Left;   Family History  Problem Relation Age of Onset  . Heart failure Mother   . Gallbladder disease Father   . Pancreatic cancer Sister    History  Substance Use Topics  . Smoking status: Former Smoker -- 0.50 packs/day for 40 years    Types: Cigarettes    Quit date: 05/30/1980  . Smokeless tobacco: Never Used  . Alcohol Use: No   OB History   Grav Para Term Preterm Abortions TAB SAB Ect Mult Living                 Review of Systems  All other systems reviewed and are negative.  Other than listed in hpi   Allergies  Demerol; Penicillins; Phenergan; Codeine; Macrobid; and Relafen  Home Medications   Prior to Admission medications   Medication Sig Start Date End Date Taking? Authorizing Provider  acetaminophen (TYLENOL) 500 MG tablet Take 500 mg by mouth every 6 (six)  hours as needed.   Yes Historical Provider, MD  ALPRAZolam Duanne Moron) 0.5 MG tablet Take 0.25 mg by mouth 2 (two) times daily as needed for sleep.    Yes Historical Provider, MD  aspirin EC 81 MG tablet Take 81 mg by mouth every morning.   Yes Historical Provider, MD  cholecalciferol (VITAMIN D) 1000 UNITS tablet Take 1,000 Units by mouth every morning.   Yes Historical Provider, MD  Chromium-Cinnamon [phone] MCG-MG CAPS Take 1 tablet by mouth at bedtime.    Yes Historical Provider, MD  Dextromethorphan-Guaifenesin (TUSSIN DM PO) Take 5 mLs by mouth as needed.    Yes Historical Provider, MD  diclofenac sodium (VOLTAREN) 1 % GEL Apply 4 g topically 2 (two) times daily as needed (for joint pain). For joint pain    Historical Provider, MD  Dietary Management Product (TOZAL PO) Take 3 tablets by mouth daily. For eyes    Historical Provider, MD  docusate sodium  (COLACE) 100 MG capsule Take 100-200 mg by mouth at bedtime as needed for constipation.     Historical Provider, MD  fexofenadine (ALLEGRA) 180 MG tablet Take 180 mg by mouth at bedtime.    Historical Provider, MD  fluticasone (FLONASE) 50 MCG/ACT nasal spray Place 2 sprays into the nose daily.     Historical Provider, MD  glipiZIDE (GLUCOTROL XL) 10 MG 24 hr tablet Take 10 mg by mouth every morning.    Historical Provider, MD  hydroxypropyl methylcellulose (ISOPTO TEARS) 2.5 % ophthalmic solution Place 1 drop into both eyes 3 (three) times daily as needed (for dry eyes).    Historical Provider, MD  lisinopril (PRINIVIL,ZESTRIL) 5 MG tablet Take 5 mg by mouth daily.    Historical Provider, MD  nebivolol (BYSTOLIC) 5 MG tablet Take 5 mg by mouth daily. 05/20/12   Barton Dubois, MD  pantoprazole (PROTONIX) 40 MG tablet Take 40 mg by mouth every morning.    Historical Provider, MD  polyethylene glycol (MIRALAX / GLYCOLAX) packet Take 17 g by mouth daily as needed (for constipation).    Historical Provider, MD  torsemide (DEMADEX) 20 MG tablet Take 20 mg by mouth daily.    Historical Provider, MD  traMADol (ULTRAM) 50 MG tablet 50 mg 3 (three) times daily.  05/20/12   Barton Dubois, MD   BP 103/65  Pulse 124  Temp(Src) 100.7 F (38.2 C) (Rectal)  Resp 22  SpO2 94% Physical Exam  Nursing note and vitals reviewed. Constitutional: She is oriented to person, place, and time. She appears well-developed and well-nourished. She appears distressed (coughing, sob).  Morbidly obese  HENT:  Head: Normocephalic and atraumatic.  Right Ear: External ear normal.  Left Ear: External ear normal.  Nose: Nose normal.  Mouth/Throat: Oropharynx is clear and moist.  Eyes: Conjunctivae and EOM are normal. Pupils are equal, round, and reactive to light.  Neck: Normal range of motion. Neck supple. No JVD present. No tracheal deviation present. No thyromegaly present.  Cardiovascular: Normal rate, regular rhythm,  normal heart sounds and intact distal pulses.  Exam reveals no gallop and no friction rub.   No murmur heard. Pulmonary/Chest: No stridor. She is in respiratory distress (mild). She has wheezes. She has rales. She exhibits no tenderness.  Abdominal: Soft. Bowel sounds are normal. She exhibits no distension and no mass. There is no tenderness. There is no rebound and no guarding.  Musculoskeletal: Normal range of motion. She exhibits edema (1+ to knees). She exhibits no tenderness.  Lymphadenopathy:  She has no cervical adenopathy.  Neurological: She is alert and oriented to person, place, and time. She exhibits normal muscle tone. Coordination normal.  Skin: Skin is warm and dry. No rash noted. No erythema. No pallor.  Psychiatric: She has a normal mood and affect. Her behavior is normal. Judgment and thought content normal.    ED Course  Procedures (including critical care time) Labs Review Labs Reviewed  CBC WITH DIFFERENTIAL - Abnormal; Notable for the following:    WBC 15.5 (*)    Neutro Abs 11.6 (*)    Monocytes Absolute 1.8 (*)    All other components within normal limits  PRO B NATRIURETIC PEPTIDE - Abnormal; Notable for the following:    Pro B Natriuretic peptide (BNP) 1416.0 (*)    All other components within normal limits  BASIC METABOLIC PANEL - Abnormal; Notable for the following:    Chloride 93 (*)    CO2 34 (*)    Glucose, Bld 174 (*)    BUN 34 (*)    GFR calc non Af Amer 58 (*)    GFR calc Af Amer 68 (*)    All other components within normal limits  TROPONIN I    Imaging Review Dg Chest 2 View  09/28/2013   CLINICAL DATA:  Shortness of breath, cough  EXAM: CHEST  2 VIEW  COMPARISON:  CT chest dated 09/05/2013  FINDINGS: Rounded opacity at the lateral left lung base, corresponding to the pleural based lesion on prior chest CT, 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  Increased interstitial markings, favored to be chronic. No pleural  effusion or pneumothorax.  Mild cardiomegaly.  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  No evidence of acute cardiopulmonary disease.   Electronically Signed   By: Julian Hy M.D.   On: 09/28/2013 01:17     EKG Interpretation None      MDM   Final diagnoses:  Dyspnea    80 yo female with dyspnea.  Most likely multifactorial, has both wheezing and rales, wheezing much improved after hour neb.  No pna seen on cxr, but reports subjective fevers and has elevated wbc.  Will cover with levaquin.  To receive lasix 40 mg iv.  D/w hospitalist for admission.    Kalman Drape, MD 09/28/13 [phone]

## 2013-09-28 NOTE — ED Notes (Signed)
RT contacted to start neb tx as ordered

## 2013-09-28 NOTE — ED Notes (Signed)
Bed: OF12 Expected date: 09/28/13 Expected time: 12:25 AM Means of arrival: Ambulance Comments: Bed 22, EMS, 69 F, Cough/wheezing

## 2013-09-29 ENCOUNTER — Other Ambulatory Visit: Payer: Self-pay

## 2013-09-29 DIAGNOSIS — I359 Nonrheumatic aortic valve disorder, unspecified: Secondary | ICD-10-CM

## 2013-09-29 LAB — CBC WITH DIFFERENTIAL/PLATELET
BASOS PCT: 0 % (ref 0–1)
Basophils Absolute: 0 10*3/uL (ref 0.0–0.1)
EOS PCT: 0 % (ref 0–5)
Eosinophils Absolute: 0 10*3/uL (ref 0.0–0.7)
HCT: 38.7 % (ref 36.0–46.0)
HEMOGLOBIN: 12 g/dL (ref 12.0–15.0)
LYMPHS ABS: 1.2 10*3/uL (ref 0.7–4.0)
Lymphocytes Relative: 11 % — ABNORMAL LOW (ref 12–46)
MCH: 29.1 pg (ref 26.0–34.0)
MCHC: 31 g/dL (ref 30.0–36.0)
MCV: 93.7 fL (ref 78.0–100.0)
MONO ABS: 0.4 10*3/uL (ref 0.1–1.0)
Monocytes Relative: 4 % (ref 3–12)
NEUTROS ABS: 8.9 10*3/uL — AB (ref 1.7–7.7)
Neutrophils Relative %: 85 % — ABNORMAL HIGH (ref 43–77)
Platelets: 242 10*3/uL (ref 150–400)
RBC: 4.13 MIL/uL (ref 3.87–5.11)
RDW: 12.5 % (ref 11.5–15.5)
WBC: 10.5 10*3/uL (ref 4.0–10.5)

## 2013-09-29 LAB — COMPREHENSIVE METABOLIC PANEL
ALT: 16 U/L (ref 0–35)
AST: 16 U/L (ref 0–37)
Albumin: 2.8 g/dL — ABNORMAL LOW (ref 3.5–5.2)
Alkaline Phosphatase: 75 U/L (ref 39–117)
BUN: 50 mg/dL — ABNORMAL HIGH (ref 6–23)
CO2: 34 meq/L — AB (ref 19–32)
CREATININE: 1.09 mg/dL (ref 0.50–1.10)
Calcium: 9.8 mg/dL (ref 8.4–10.5)
Chloride: 95 mEq/L — ABNORMAL LOW (ref 96–112)
GFR, EST AFRICAN AMERICAN: 54 mL/min — AB (ref >90)
GFR, EST NON AFRICAN AMERICAN: 47 mL/min — AB (ref >90)
GLUCOSE: 228 mg/dL — AB (ref 70–99)
Potassium: 5.4 mEq/L — ABNORMAL HIGH (ref 3.7–5.3)
Sodium: 138 mEq/L (ref 137–147)
Total Bilirubin: <0.2 mg/dL — ABNORMAL LOW (ref 0.3–1.2)
Total Protein: 7.3 g/dL (ref 6.0–8.3)

## 2013-09-29 LAB — PHOSPHORUS: Phosphorus: 4 mg/dL (ref 2.3–4.6)

## 2013-09-29 LAB — GLUCOSE, CAPILLARY
GLUCOSE-CAPILLARY: 224 mg/dL — AB (ref 70–99)
Glucose-Capillary: 222 mg/dL — ABNORMAL HIGH (ref 70–99)
Glucose-Capillary: 105 mg/dL — ABNORMAL HIGH (ref 70–99)
Glucose-Capillary: 93 mg/dL (ref 70–99)

## 2013-09-29 LAB — TSH: TSH: 0.558 u[IU]/mL (ref 0.350–4.500)

## 2013-09-29 LAB — PRO B NATRIURETIC PEPTIDE: Pro B Natriuretic peptide (BNP): 2609 pg/mL — ABNORMAL HIGH (ref 0–450)

## 2013-09-29 LAB — MAGNESIUM: MAGNESIUM: 2.3 mg/dL (ref 1.5–2.5)

## 2013-09-29 MED ORDER — MENTHOL 3 MG MT LOZG
1.0000 | LOZENGE | OROMUCOSAL | Status: DC | PRN
  Administered 2013-09-30: 3 mg via ORAL
  Filled 2013-09-29: qty 9

## 2013-09-29 MED ORDER — VITAMINS A & D EX OINT
TOPICAL_OINTMENT | CUTANEOUS | Status: AC
  Administered 2013-09-29: 21:00:00
  Filled 2013-09-29: qty 5

## 2013-09-29 MED ORDER — BENZONATATE 100 MG PO CAPS
200.0000 mg | ORAL_CAPSULE | Freq: Three times a day (TID) | ORAL | Status: DC | PRN
  Administered 2013-09-29: 200 mg via ORAL
  Filled 2013-09-29: qty 2

## 2013-09-29 MED ORDER — SODIUM POLYSTYRENE SULFONATE 15 GM/60ML PO SUSP
30.0000 g | Freq: Once | ORAL | Status: AC
  Administered 2013-09-29: 30 g via ORAL
  Filled 2013-09-29: qty 120

## 2013-09-29 NOTE — Progress Notes (Signed)
EKG performed this am however unable to transmit to EMR. Result shows sinus brady at 52 bpm. Hard copy placed in shadow chart. Lovina Reach, RN

## 2013-09-29 NOTE — Progress Notes (Signed)
[facility] PROGRESS NOTE  Montine Hight QPY:[id number] DOB: 1931/10/11 DOA: 09/28/2013 PCP: Marjorie Smolder, MD  Assessment/Plan: Acute respiratory failure probably secondary to copd exacerbation and CHF ACUTE on chronic diastolic heart failure.  -  On IV solumedrol, and she received one dose os IV LASIX.  - resume bronchodilators and torsemide. levaquin for bronchitis.  - daily weights and intake and output. Serial troponins negative. And echocardiogram showed grade 2 diastolic dysfunction with elevated pulmonary pressures.  hgba1c is 8.6 - CXR does not show pneumonia   Diabetes mellitus: -resume SSI.   Hypertension: - controlled.   OSA:  ON CPAP at night.   DVT prophylaxis.  -  Code Status: full code Family Communication: family and caregiver at bedside Disposition Plan: pending further investigation and care.    Consultants:  none  Procedures:  echo  Antibiotics:  levaquin  HPI/Subjective: Feel much better than yesterday .  Objective: Filed Vitals:   09/29/13 1430  BP: 137/57  Pulse: 62  Temp: 98.4 F (36.9 C)  Resp: 19    Intake/Output Summary (Last 24 hours) at 09/29/13 1734 Last data filed at 09/29/13 1500  Gross per 24 hour  Intake    846 ml  Output      0 ml  Net    846 ml   Filed Weights   09/28/13 0533 09/29/13 0613  Weight: 129.819 kg (286 lb 3.2 oz) 130.817 kg (288 lb 6.4 oz)    Exam:   General:  Alert afebrile comfortable  Cardiovascular: s1s2 tachycardic.   Respiratory: ctab  Abdomen: soft NT ND BS+  Musculoskeletal: no pedal edema.  Data Reviewed: Basic Metabolic Panel:  Recent Labs Lab 09/28/13 0147 09/28/13 0712 09/29/13 0535  NA 140  --  138  K 3.9  --  5.4*  CL 93*  --  95*  CO2 34*  --  34*  GLUCOSE 174*  --  228*  BUN 34*  --  50*  CREATININE 0.91 0.93 1.09  CALCIUM 9.9  --  9.8  MG  --   --  2.3  PHOS  --   --  4.0   Liver Function Tests:  Recent Labs Lab 09/29/13 0535  AST 16  ALT 16   ALKPHOS 75  BILITOT <0.2*  PROT 7.3  ALBUMIN 2.8*   No results found for this basename: LIPASE, AMYLASE,  in the last 168 hours No results found for this basename: AMMONIA,  in the last 168 hours CBC:  Recent Labs Lab 09/28/13 0147 09/28/13 0712 09/29/13 0535  WBC 15.5* 12.5* 10.5  NEUTROABS 11.6*  --  8.9*  HGB 13.0 12.2 12.0  HCT 41.1 38.6 38.7  MCV 94.5 93.2 93.7  PLT 240 221 242   Cardiac Enzymes:  Recent Labs Lab 09/28/13 0147 09/28/13 0712 09/28/13 1252  TROPONINI <0.30 <0.30 <0.30   BNP (last 3 results)  Recent Labs  09/28/13 0147 09/29/13 1024  PROBNP 1416.0* 2609.0*   CBG:  Recent Labs Lab 09/28/13 1704 09/28/13 2049 09/29/13 0757 09/29/13 1217 09/29/13 1702  GLUCAP 258* 216* 224* 222* 105*    No results found for this or any previous visit (from the past 240 hour(s)).   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  Increased interstitial markings, favored to be chronic. No pleural effusion or pneumothorax.  Mild cardiomegaly.  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  No evidence of acute cardiopulmonary disease.   Electronically Signed   By: Julian Hy M.D.   On: 09/28/2013 01:17    Scheduled Meds: . aspirin EC  81 mg Oral q morning - 10a  . docusate sodium  100 mg Oral BID  . enoxaparin (LOVENOX) injection  40 mg Subcutaneous Q24H  . fluticasone  2 spray Each Nare Daily  . glipiZIDE  10 mg Oral QAC breakfast  . insulin aspart  0-15 Units Subcutaneous TID WC  . insulin aspart  0-5 Units Subcutaneous QHS  . ipratropium  0.5 mg Nebulization Q6H  . levofloxacin  (LEVAQUIN) IV  750 mg Intravenous Q24H  . loratadine  10 mg Oral Daily  . methylPREDNISolone (SOLU-MEDROL) injection  60 mg Intravenous Q24H  . nebivolol  5 mg Oral Daily  . pantoprazole  40 mg Oral q morning - 10a  . sodium chloride  3 mL Intravenous Q12H  . sodium chloride  3 mL Intravenous Q12H  . torsemide  20 mg Oral BID   Continuous Infusions:   Active Problems:   Acute diastolic heart failure, NYHA class 2   Diabetes mellitus   Hypertension   OSA (obstructive sleep apnea)   Cancer of upper-outer quadrant of female breast   Dyspnea   COPD exacerbation    Time spent: 35 min    Hosie Poisson  [facility] Pager [phone]. If 7PM-7AM, please contact night-coverage at [email or web address], password [facility] 09/29/2013, 5:34 PM  LOS: 1 day

## 2013-09-29 NOTE — Progress Notes (Signed)
Echocardiogram 2D Echocardiogram has been performed.  Makayla Rodriguez 09/29/2013, 12:09 PM

## 2013-09-29 NOTE — Plan of Care (Signed)
Problem: Phase I Progression Outcomes Goal: Progress activity as tolerated unless otherwise ordered Outcome: Progressing Up to chair today

## 2013-09-30 LAB — GLUCOSE, CAPILLARY
GLUCOSE-CAPILLARY: 259 mg/dL — AB (ref 70–99)
GLUCOSE-CAPILLARY: 137 mg/dL — AB (ref 70–99)
Glucose-Capillary: 296 mg/dL — ABNORMAL HIGH (ref 70–99)
Glucose-Capillary: 216 mg/dL — ABNORMAL HIGH (ref 70–99)

## 2013-09-30 LAB — BASIC METABOLIC PANEL
BUN: 63 mg/dL — ABNORMAL HIGH (ref 6–23)
CALCIUM: 9.4 mg/dL (ref 8.4–10.5)
CHLORIDE: 92 meq/L — AB (ref 96–112)
CO2: 37 meq/L — AB (ref 19–32)
CREATININE: 1.12 mg/dL — AB (ref 0.50–1.10)
GFR calc Af Amer: 53 mL/min — ABNORMAL LOW (ref >90)
GFR calc non Af Amer: 45 mL/min — ABNORMAL LOW (ref >90)
GLUCOSE: 261 mg/dL — AB (ref 70–99)
Potassium: 5 mEq/L (ref 3.7–5.3)
SODIUM: 138 meq/L (ref 137–147)

## 2013-09-30 MED ORDER — PREDNISONE 50 MG PO TABS
60.0000 mg | ORAL_TABLET | Freq: Every day | ORAL | Status: AC
  Administered 2013-09-30 – 2013-10-02 (×3): 60 mg via ORAL
  Filled 2013-09-30 (×3): qty 1

## 2013-09-30 MED ORDER — TORSEMIDE 20 MG PO TABS
20.0000 mg | ORAL_TABLET | Freq: Every day | ORAL | Status: DC
  Administered 2013-10-01 – 2013-10-02 (×2): 20 mg via ORAL
  Filled 2013-09-30 (×3): qty 1

## 2013-09-30 MED ORDER — IPRATROPIUM BROMIDE 0.02 % IN SOLN
0.5000 mg | Freq: Four times a day (QID) | RESPIRATORY_TRACT | Status: DC
  Administered 2013-09-30 – 2013-10-01 (×5): 0.5 mg via RESPIRATORY_TRACT
  Filled 2013-09-30 (×5): qty 2.5

## 2013-09-30 MED ORDER — ALPRAZOLAM 0.25 MG PO TABS
0.2500 mg | ORAL_TABLET | Freq: Three times a day (TID) | ORAL | Status: DC | PRN
  Administered 2013-09-30: 0.25 mg via ORAL
  Filled 2013-09-30 (×3): qty 1

## 2013-09-30 NOTE — Evaluation (Signed)
Physical Therapy Evaluation Patient Details Name: Makayla Rodriguez MRN: [id number] DOB: 1932/01/07 Today's Date: 09/30/2013   History of Present Illness  80 yo female admitted with dyspnea.   Clinical Impression  On eval, pt required Min guard assist for mobility-able to ambulate ~20 feet in room with walker. Pt declined to ambulate in hallway. Plans to return home with nursing aides.     Follow Up Recommendations No PT follow up;Supervision/Assistance - 24 hour    Equipment Recommendations  None recommended by PT    Recommendations for Other Services       Precautions / Restrictions Precautions Precautions: Fall Restrictions Weight Bearing Restrictions: No      Mobility  Bed Mobility               General bed mobility comments: pt sitting in recliner  Transfers Overall transfer level: Needs assistance Equipment used: Rolling walker (2 wheeled) Transfers: Sit to/from Stand Sit to Stand: Min guard         General transfer comment: close guard for safety  Ambulation/Gait Ambulation/Gait assistance: Min guard Ambulation Distance (Feet): 20 Feet Assistive device: Rolling walker (2 wheeled) Gait Pattern/deviations: Decreased stride length;Step-through pattern     General Gait Details: Pt declined to ambulate in hallway and only agreeable to one short lap in room. slow gait speed. fatigues fairly easily.   Stairs            Wheelchair Mobility    Modified Rankin (Stroke Patients Only)       Balance                                             Pertinent Vitals/Pain Back 7/10 with activity    Home Living Family/patient expects to be discharged to:: Private residence   Available Help at Discharge: Available 24 hours/day;Personal care attendant Type of Home: House Home Access: Stairs to enter   [employer] of Steps: 1 threshold Home Layout: One level Home Equipment: [place] - 4 wheels;Bedside commode;Shower seat -  built in      Prior Function Level of Independence: Needs assistance   Gait / Transfers Assistance Needed: usesd rollator. walks household distances  ADL's / Homemaking Assistance Needed: assist with bathing, dressing, meal preparation. Pt feeds herself        Hand Dominance        Extremity/Trunk Assessment   Upper Extremity Assessment: Overall WFL for tasks assessed           Lower Extremity Assessment: Generalized weakness;LLE deficits/detail;RLE deficits/detail RLE Deficits / Details: LE swelling LLE Deficits / Details: LE swelling  Cervical / Trunk Assessment: Normal  Communication   Communication: No difficulties  Cognition Arousal/Alertness: Awake/alert Behavior During Therapy: WFL for tasks assessed/performed Overall Cognitive Status: Within Functional Limits for tasks assessed                      General Comments      Exercises        Assessment/Plan    PT Assessment Patient needs continued PT services  PT Diagnosis Difficulty walking;Generalized weakness   PT Problem List Decreased mobility;Decreased activity tolerance;Decreased strength;Obesity  PT Treatment Interventions Gait training;Functional mobility training;Therapeutic exercise;Therapeutic activities;Patient/family education   PT Goals (Current goals can be found in the Care Plan section) Acute Rehab PT Goals Patient Stated Goal: return home PT Goal Formulation: With patient Time  For Goal Achievement: 10/14/13 Potential to Achieve Goals: Good    Frequency Min 3X/week   Barriers to discharge        Co-evaluation               End of Session   Activity Tolerance: Patient limited by fatigue Patient left: in chair;with call bell/phone within reach;with family/visitor present           Time: 3335-4562 PT Time Calculation (min): 22 min   Charges:   PT Evaluation $Initial PT Evaluation Tier I: 1 Procedure PT Treatments $Gait Training: 8-22 mins   PT G  Codes:          Weston Anna, MPT Pager: [phone]

## 2013-09-30 NOTE — Progress Notes (Signed)
[facility] PROGRESS NOTE  Makayla Rodriguez UXN:[id number] DOB: [birth year] DOA: 09/28/2013 PCP: Marjorie Smolder, MD  Assessment/Plan: Acute respiratory failure probably secondary to copd exacerbation and CHF ACUTE on chronic diastolic heart failure.  -  On IV solumedrol, and she received one dose os IV LASIX.  - resume bronchodilators and torsemide. levaquin for bronchitis.  - daily weights and intake and output. Serial troponins negative. And echocardiogram showed grade 2 diastolic dysfunction with elevated pulmonary pressures.  hgba1c is 8.6 - CXR does not show pneumonia   Diabetes mellitus: -resume SSI.   Hypertension: - controlled.   OSA:  ON CPAP at night.   DVT prophylaxis.  -  Code Status: full code Family Communication: family and caregiver at bedside Disposition Plan: pending further investigation and care.    Consultants:  none  Procedures:  echo  Antibiotics:  levaquin  HPI/Subjective: Feel much better than yesterday .  Objective: Filed Vitals:   09/30/13 1342  BP: 105/79  Pulse: 72  Temp: 98.1 F (36.7 C)  Resp: 22    Intake/Output Summary (Last 24 hours) at 09/30/13 1946 Last data filed at 09/30/13 1844  Gross per 24 hour  Intake   1260 ml  Output      0 ml  Net   1260 ml   Filed Weights   09/28/13 0533 09/29/13 0613 09/30/13 0726  Weight: 129.819 kg (286 lb 3.2 oz) 130.817 kg (288 lb 6.4 oz) 128.7 kg (283 lb 11.7 oz)    Exam:   General:  Alert afebrile comfortable  Cardiovascular: s1s2 tachycardic.   Respiratory: ctab  Abdomen: soft NT ND BS+  Musculoskeletal: no pedal edema.  Data Reviewed: Basic Metabolic Panel:  Recent Labs Lab 09/28/13 0147 09/28/13 0712 09/29/13 0535 09/30/13 0408  NA 140  --  138 138  K 3.9  --  5.4* 5.0  CL 93*  --  95* 92*  CO2 34*  --  34* 37*  GLUCOSE 174*  --  228* 261*  BUN 34*  --  50* 63*  CREATININE 0.91 0.93 1.09 1.12*  CALCIUM 9.9  --  9.8 9.4  MG  --   --  2.3  --    PHOS  --   --  4.0  --    Liver Function Tests:  Recent Labs Lab 09/29/13 0535  AST 16  ALT 16  ALKPHOS 75  BILITOT <0.2*  PROT 7.3  ALBUMIN 2.8*   No results found for this basename: LIPASE, AMYLASE,  in the last 168 hours No results found for this basename: AMMONIA,  in the last 168 hours CBC:  Recent Labs Lab 09/28/13 0147 09/28/13 0712 09/29/13 0535  WBC 15.5* 12.5* 10.5  NEUTROABS 11.6*  --  8.9*  HGB 13.0 12.2 12.0  HCT 41.1 38.6 38.7  MCV 94.5 93.2 93.7  PLT 240 221 242   Cardiac Enzymes:  Recent Labs Lab 09/28/13 0147 09/28/13 0712 09/28/13 1252  TROPONINI <0.30 <0.30 <0.30   BNP (last 3 results)  Recent Labs  09/28/13 0147 09/29/13 1024  PROBNP 1416.0* 2609.0*   CBG:  Recent Labs Lab 09/29/13 1702 09/29/13 2141 09/30/13 0646 09/30/13 1111 09/30/13 1646  GLUCAP 105* 93 296* 259* 137*    No results found for this or any previous visit (from the past 240 hour(s)).   Studies: No results found.  Scheduled Meds: . aspirin EC  81 mg Oral q morning - 10a  . docusate sodium  100 mg Oral BID  . enoxaparin (LOVENOX)  injection  40 mg Subcutaneous Q24H  . fluticasone  2 spray Each Nare Daily  . insulin aspart  0-15 Units Subcutaneous TID WC  . insulin aspart  0-5 Units Subcutaneous QHS  . ipratropium  0.5 mg Nebulization QID  . levofloxacin (LEVAQUIN) IV  750 mg Intravenous Q24H  . loratadine  10 mg Oral Daily  . nebivolol  5 mg Oral Daily  . pantoprazole  40 mg Oral q morning - 10a  . predniSONE  60 mg Oral QAC breakfast  . sodium chloride  3 mL Intravenous Q12H  . sodium chloride  3 mL Intravenous Q12H  . [START ON 10/01/2013] torsemide  20 mg Oral Daily   Continuous Infusions:   Active Problems:   Acute diastolic heart failure, NYHA class 2   Diabetes mellitus   Hypertension   OSA (obstructive sleep apnea)   Cancer of upper-outer quadrant of female breast   Dyspnea   COPD exacerbation    Time spent: 35 min    Hosie Poisson  [facility] Pager [phone]. If 7PM-7AM, please contact night-coverage at [email or web address], password [facility] 09/30/2013, 7:46 PM  LOS: 2 days

## 2013-09-30 NOTE — Progress Notes (Signed)
CARE MANAGEMENT NOTE 09/30/2013  Patient:  Makayla Rodriguez, Makayla Rodriguez   Account Number:  [id number]  Date Initiated:  09/28/2013  Documentation initiated by:  [facility]  Subjective/Objective Assessment:   CHF     Action/Plan:   oxygen at home   Anticipated DC Date:  10/02/2013   Anticipated DC Plan:  [place]  CM consult      Choice offered to / List presented to:  C-1 Patient           Status of service:  In process, will continue to follow Medicare Important Message given?  YES (If response is "NO", the following Medicare IM given date fields will be blank) Date Medicare IM given:  09/28/2013 Date Additional Medicare IM given:    Discharge Disposition:    Per UR Regulation:  Reviewed for med. necessity/level of care/duration of stay  If discussed at Long Length of Stay Meetings, dates discussed:    Comments:  09/30/13 Emanual Lamountain RN,BSN NCM [place] 8HR/DY/7 DAYS/WK.USED [place] IN PAST AGREE TO USE THEM AGAIN IF [place].TC DEBBIE LIAS[place] FOR REFERRAL.AWAIT FINAL HHC ORDER.WOULD RECOMMEND PT CONS.USES [facility] FOR HOME 02.COPD,CHF-IV ABX,IV SAOLUMEDROL,02.

## 2013-09-30 NOTE — Progress Notes (Signed)
Inpatient Diabetes Program Recommendations  AACE/ADA: New Consensus Statement on Inpatient Glycemic Control (2013)  Target Ranges:  Prepandial:   less than 140 mg/dL      Peak postprandial:   less than 180 mg/dL (1-2 hours)      Critically ill patients:  140 - 180 mg/dL   Results for DEAISHA, WELBORN (MRN [id number]) as of 09/30/2013 14:12  Ref. Range 09/29/2013 07:57 09/29/2013 12:17 09/29/2013 17:02 09/29/2013 21:41 09/30/2013 06:46 09/30/2013 11:11  Glucose-Capillary Latest Range: 70-99 mg/dL 224 (H) 222 (H) 105 (H) 93 296 (H) 259 (H)   Diabetes history: DM2 Outpatient Diabetes medications: Glipizide 10 mg QAM Current orders for Inpatient glycemic control: Novolog 0-15 units AC, Novolog 0-5 units HS  Inpatient Diabetes Program Recommendations Insulin - Basal: Please consider ordering low dose basal insulin; recommend ordering Levemir 6 units QHS. Insulin - Meal Coverage: If post prandial glucose continues to be elevated, may want to consider ordering Novolog 3 units TID with meals for meal coverage.  Note: Noted fasting glucose 224 mg/dl on 5/3 and 296 mg/dl today. Also noted steroids have been decreased and changed to PO.  Please consider ordering Levemir 6 units QHS and if post prandial glucose continues to be elevate, may want to consider ordering Novolog 3 units TID with meals for meal coverage.  Thanks, Barnie Alderman, RN, MSN, CCRN Diabetes Coordinator Inpatient Diabetes Program [phone] (Team Pager) [phone] ([facility] office) [phone] [facility] office)

## 2013-10-01 LAB — GLUCOSE, CAPILLARY
GLUCOSE-CAPILLARY: 290 mg/dL — AB (ref 70–99)
GLUCOSE-CAPILLARY: 224 mg/dL — AB (ref 70–99)
Glucose-Capillary: 307 mg/dL — ABNORMAL HIGH (ref 70–99)

## 2013-10-01 LAB — BASIC METABOLIC PANEL
BUN: 58 mg/dL — ABNORMAL HIGH (ref 6–23)
CO2: 39 meq/L — AB (ref 19–32)
Calcium: 9.5 mg/dL (ref 8.4–10.5)
Chloride: 93 mEq/L — ABNORMAL LOW (ref 96–112)
Creatinine, Ser: 1 mg/dL (ref 0.50–1.10)
GFR calc non Af Amer: 52 mL/min — ABNORMAL LOW (ref >90)
GFR, EST AFRICAN AMERICAN: 60 mL/min — AB (ref >90)
Glucose, Bld: 232 mg/dL — ABNORMAL HIGH (ref 70–99)
POTASSIUM: 4.4 meq/L (ref 3.7–5.3)
Sodium: 139 mEq/L (ref 137–147)

## 2013-10-01 MED ORDER — IPRATROPIUM BROMIDE 0.02 % IN SOLN
0.5000 mg | Freq: Two times a day (BID) | RESPIRATORY_TRACT | Status: DC
  Administered 2013-10-01 – 2013-10-03 (×4): 0.5 mg via RESPIRATORY_TRACT
  Filled 2013-10-01 (×4): qty 2.5

## 2013-10-01 MED ORDER — SALINE SPRAY 0.65 % NA SOLN
1.0000 | NASAL | Status: DC | PRN
  Filled 2013-10-01: qty 44

## 2013-10-01 NOTE — Evaluation (Signed)
Occupational Therapy Evaluation Patient Details Name: Makayla Rodriguez MRN: [id number] DOB: 12/02/1931 Today's Date: 10/01/2013    History of Present Illness 80 yo female admitted with dyspnea.    Clinical Impression   Pt initially declining any OT needs but as toilet transfers were discussed pt with questions regarding 3in1 option. Practiced pivot to 3in1 BSC and pt with some difficulty due to pt states she has a history of arthritis and it started to bother her in standing. Needed assist with clothing management. Will continue to see pt for OT to progress toileting independence. She has assist with bath/dressing/household tasks at home.    Follow Up Recommendations  No OT follow up;Supervision/Assistance - 24 hour    Equipment Recommendations  None recommended by OT    Recommendations for Other Services       Precautions / Restrictions Precautions Precautions: Fall Precaution Comments: O2 at home      Mobility Bed Mobility               General bed mobility comments: pt sitting in recliner  Transfers Overall transfer level: Needs assistance Equipment used: Rolling walker (2 wheeled) Transfers: Sit to/from [facility] Sit to Stand: Min guard Stand pivot transfers: Min assist       General transfer comment: mod verbal cues for safety with walker. She doesnt tend to bring it all the way around during pivot with her. Min assist as back discomfort increased in standing.     Balance                                            ADL Overall ADL's : Needs assistance/impaired Eating/Feeding: Independent;Sitting   Grooming: Wash/dry hands;Set up;Sitting                   Toilet Transfer: Minimal assistance;Stand-pivot;BSC;RW Toilet Transfer Details (indicate cue type and reason): pivot to [facility] but pt didnt want to sit on it because she felt it was going to be too narrow. see notes below Toileting- Clothing Manipulation and  Hygiene: Moderate assistance;Sit to/from stand         General ADL Comments: Pt has assist for all bathing/dressing tasks so not assessed for this visit as she will continue to have assist at home. She states there are times she may be home a lone an hour or two so wanted to practice toilet transfers with pt in case she has to toilet at home when no one is available. Although pt does states she can have 24/7 if needed. She states her toilet is 17 inches and she would like it to be a little higher. Discusseed option of having taller toilet installed versus 3in1 over toilet to make it taller. Pt wanted to try [facility] pivot. Used walker to pivot to [facility] but pt didnt want to sit down because she felt it would be too narrow. Explained there is a wide 3in1 also. She declines getting 3in1 at this time. She did start to have some discomfort in her back with standing and needed assist to manage depends in standing. She needed mod verbal cues to safely manuever back over to recliner with walker. O2 sats on RA after pivot 88% so replaced O2 and up to 93% on 2L.     Vision  Perception     Praxis      Pertinent Vitals/Pain No pain initially. States some back discomfort with standing (states history of arthritis) better when she sat back in chair again.     Hand Dominance     Extremity/Trunk Assessment Upper Extremity Assessment Upper Extremity Assessment: Generalized weakness           Communication Communication Communication: No difficulties   Cognition Arousal/Alertness: Awake/alert Behavior During Therapy: WFL for tasks assessed/performed Overall Cognitive Status: Within Functional Limits for tasks assessed                     General Comments       Exercises       Shoulder Instructions      Home Living Family/patient expects to be discharged to:: Private residence   Available Help at Discharge: Available 24 hours/day;Personal care attendant  (currently a few hours a lone but can be available 24/7) Type of Home: House Home Access: Stairs to enter [employer] of Steps: 1 threshold   Home Layout: One level         Bathroom Toilet: Handicapped height     Home Equipment: Environmental consultant - 4 wheels;Shower seat - built in;Grab bars - toilet   Additional Comments: pt states her toilet is 17 inches and has grab bar and vanity. she would like toilet to be a little higher.      Prior Functioning/Environment Level of Independence: Needs assistance  Gait / Transfers Assistance Needed: usesd rollator. walks household distances ADL's / Homemaking Assistance Needed: assist with bathing, dressing, meal preparation. Pt feeds herself        OT Diagnosis: Generalized weakness   OT Problem List: Decreased strength;Decreased knowledge of use of DME or AE   OT Treatment/Interventions: Self-care/ADL training;Patient/family education;Therapeutic activities;DME and/or AE instruction    OT Goals(Current goals can be found in the care plan section) Acute Rehab OT Goals Patient Stated Goal: return home OT Goal Formulation: With patient Time For Goal Achievement: 10/15/13 Potential to Achieve Goals: Good  OT Frequency: Min 2X/week   Barriers to D/C:            Co-evaluation              End of Session Equipment Utilized During Treatment: Rolling walker;Oxygen  Activity Tolerance: Patient limited by pain (better once in chair) Patient left: in chair;with call bell/phone within reach;with nursing/sitter in room   Time: 9518-8416 OT Time Calculation (min): 24 min Charges:  OT General Charges $OT Visit: 1 Procedure OT Evaluation $Initial OT Evaluation Tier I: 1 Procedure OT Treatments $Therapeutic Activity: 8-22 mins G-Codes:    Jules Schick [phone] 10/01/2013, 9:43 AM

## 2013-10-01 NOTE — Plan of Care (Signed)
Problem: Food- and Nutrition-Related Knowledge Deficit (NB-1.1) Goal: Nutrition education Formal process to instruct or train a patient/client in a skill or to impart knowledge to help patients/clients voluntarily manage or modify food choices and eating behavior to maintain or improve health. Outcome: Completed/Met Date Met:  10/01/13  RD consulted for nutrition education regarding diabetes.     Lab Results  Component Value Date    HGBA1C 8.6* 09/28/2013    RD provided "Carbohydrate Counting for People with Diabetes" handout from the Academy of Nutrition and Dietetics. Discussed different food groups and their effects on blood sugar, emphasizing carbohydrate-containing foods. Provided list of carbohydrates and recommended serving sizes of common foods.  Discussed importance of controlled and consistent carbohydrate intake throughout the day. Provided examples of ways to balance meals/snacks and encouraged intake of high-fiber, whole grain complex carbohydrates. Teach back method used.  Expect good compliance. Pt had many questions concerning specific foods for controlling blood glucose control. Discussed meal planning, and importance of portion control as pt noted this was her biggest obstacle. Provided pt multiple copies of "MyPlate Meal Planning" for additional examples/resources for portion control   Diet recall indicated pt eating large amounts of fruit, starchy vegetables and chicken/turkey/eggs for protein overall healthy diet. Has been trying to comply to heart healthy diet by monitoring sodium and implementing whole wheat products. Provided pt with "Heart Healthy Nutrition Therapy" handout for additional low sodium food options  Pt also w/breast cancer dx, had questions concerning soy. Discussed current research recommendations of maintaining soy intake, and not to exceed 1-2 servings of soy/day.   Pt would benefit from outpatient RD referral for follow up questions as pt was very eager  and motivated to properly implement recommendations   Body mass index is 51.02 kg/(m^2). Pt meets criteria for Morbid Obesity/Obesity III based on current BMI.  Current diet order is Carb Modified/Heart healthy, patient is consuming approximately 75%% of meals at this time. Labs and medications reviewed. No further nutrition interventions warranted at this time. RD contact information provided. If additional nutrition issues arise, please re-consult RD.  Atlee Abide MS RD LDN Clinical Dietitian [phone]

## 2013-10-01 NOTE — Progress Notes (Signed)
Pt is on sustaining Sinus Tach 110's-120's. Pt is asymptomatic. N.P. Informed. No new orders. Will continue to monitor.

## 2013-10-01 NOTE — Progress Notes (Signed)
[facility] PROGRESS NOTE  Bari Leib DDU:[id number] DOB: [birth year] DOA: 09/28/2013 PCP: Marjorie Smolder, MD Interim summary: Makayla Rodriguez is a 80 y.o. female with a past medical history of Diabetes mellitus without complication; Hypertension; Anxiety; Arthritis; Obesity; CKD (chronic kidney disease), stage III; COPD (chronic obstructive pulmonary disease); Shortness of breath; CHF (congestive heart failure); GERD (gastroesophageal reflux disease); Presented with 2 week hx of worsening cough productive of white thick phlegm and worsening shortness of breath. She was found to be febrile 100.7 in ED. Hospitalist was called for admission for COPD exacerbation vs early PNA    Assessment/Plan: Acute respiratory failure probably secondary to copd exacerbation and  ACUTE on chronic diastolic heart failure.  -  On IV solumedrol, and she received one dose os IV LASIX.  - resume bronchodilators and torsemide. levaquin for bronchitis.  - daily weights and intake and output. Serial troponins negative. And echocardiogram showed grade 2 diastolic dysfunction with elevated pulmonary pressures.  hgba1c is 8.6 - CXR does not show pneumonia   Diabetes mellitus: -resume SSI.   Hypertension: - controlled.   OSA:  ON CPAP at night.   DVT prophylaxis.  -  Code Status: full code Family Communication: family and caregiver at bedside Disposition Plan: pending further investigation and care.    Consultants:  none  Procedures:  echo  Antibiotics:  levaquin 5/2  HPI/Subjective: Feel much better than yesterday .coughing has improved. Discussed about possible discharge tomorrow.   Objective: Filed Vitals:   10/01/13 1435  BP: 115/71  Pulse: 76  Temp: 98.2 F (36.8 C)  Resp: 20    Intake/Output Summary (Last 24 hours) at 10/01/13 1834 Last data filed at 10/01/13 1300  Gross per 24 hour  Intake    990 ml  Output      0 ml  Net    990 ml   Filed Weights   09/29/13 0613  09/30/13 0726 10/01/13 0603  Weight: 130.817 kg (288 lb 6.4 oz) 128.7 kg (283 lb 11.7 oz) 130.6 kg (287 lb 14.7 oz)    Exam:   General:  Alert afebrile comfortable  Cardiovascular: s1s2 tachycardic.   Respiratory: ctab  Abdomen: soft NT ND BS+  Musculoskeletal: no pedal edema.  Data Reviewed: Basic Metabolic Panel:  Recent Labs Lab 09/28/13 0147 09/28/13 0712 09/29/13 0535 09/30/13 0408 10/01/13 1122  NA 140  --  138 138 139  K 3.9  --  5.4* 5.0 4.4  CL 93*  --  95* 92* 93*  CO2 34*  --  34* 37* 39*  GLUCOSE 174*  --  228* 261* 232*  BUN 34*  --  50* 63* 58*  CREATININE 0.91 0.93 1.09 1.12* 1.00  CALCIUM 9.9  --  9.8 9.4 9.5  MG  --   --  2.3  --   --   PHOS  --   --  4.0  --   --    Liver Function Tests:  Recent Labs Lab 09/29/13 0535  AST 16  ALT 16  ALKPHOS 75  BILITOT <0.2*  PROT 7.3  ALBUMIN 2.8*   No results found for this basename: LIPASE, AMYLASE,  in the last 168 hours No results found for this basename: AMMONIA,  in the last 168 hours CBC:  Recent Labs Lab 09/28/13 0147 09/28/13 0712 09/29/13 0535  WBC 15.5* 12.5* 10.5  NEUTROABS 11.6*  --  8.9*  HGB 13.0 12.2 12.0  HCT 41.1 38.6 38.7  MCV 94.5 93.2 93.7  PLT 240 221  242   Cardiac Enzymes:  Recent Labs Lab 09/28/13 0147 09/28/13 0712 09/28/13 1252  TROPONINI <0.30 <0.30 <0.30   BNP (last 3 results)  Recent Labs  09/28/13 0147 09/29/13 1024  PROBNP 1416.0* 2609.0*   CBG:  Recent Labs Lab 09/30/13 1646 09/30/13 2145 10/01/13 0706 10/01/13 1131 10/01/13 1721  GLUCAP 137* 216* 290* 224* 307*    No results found for this or any previous visit (from the past 240 hour(s)).   Studies: No results found.  Scheduled Meds: . aspirin EC  81 mg Oral q morning - 10a  . docusate sodium  100 mg Oral BID  . enoxaparin (LOVENOX) injection  40 mg Subcutaneous Q24H  . fluticasone  2 spray Each Nare Daily  . insulin aspart  0-15 Units Subcutaneous TID WC  . insulin aspart   0-5 Units Subcutaneous QHS  . ipratropium  0.5 mg Nebulization BID  . levofloxacin (LEVAQUIN) IV  750 mg Intravenous Q24H  . loratadine  10 mg Oral Daily  . nebivolol  5 mg Oral Daily  . pantoprazole  40 mg Oral q morning - 10a  . predniSONE  60 mg Oral QAC breakfast  . sodium chloride  3 mL Intravenous Q12H  . sodium chloride  3 mL Intravenous Q12H  . torsemide  20 mg Oral Daily   Continuous Infusions:   Active Problems:   Acute diastolic heart failure, NYHA class 2   Diabetes mellitus   Hypertension   OSA (obstructive sleep apnea)   Cancer of upper-outer quadrant of female breast   Dyspnea   COPD exacerbation    Time spent: 35 min    Hosie Poisson  [facility] Pager [phone]. If 7PM-7AM, please contact night-coverage at [email or web address], password [facility] 10/01/2013, 6:34 PM  LOS: 3 days

## 2013-10-01 NOTE — Progress Notes (Signed)
Placed pt on nasal cpap for rest.  Pt stated she can't stand to wear a full face mask and does not wear one at home.  Cpap setup on autotitration mode 5-20cm h2o with 3l o2 bledin,  Pt tolerating well at this time.  RN aware.

## 2013-10-02 DIAGNOSIS — C50419 Malignant neoplasm of upper-outer quadrant of unspecified female breast: Secondary | ICD-10-CM

## 2013-10-02 DIAGNOSIS — I10 Essential (primary) hypertension: Secondary | ICD-10-CM

## 2013-10-02 LAB — BASIC METABOLIC PANEL
BUN: 57 mg/dL — AB (ref 6–23)
CO2: 37 mEq/L — ABNORMAL HIGH (ref 19–32)
CREATININE: 0.98 mg/dL (ref 0.50–1.10)
Calcium: 9.7 mg/dL (ref 8.4–10.5)
Chloride: 92 mEq/L — ABNORMAL LOW (ref 96–112)
GFR calc Af Amer: 62 mL/min — ABNORMAL LOW (ref >90)
GFR, EST NON AFRICAN AMERICAN: 53 mL/min — AB (ref >90)
GLUCOSE: 177 mg/dL — AB (ref 70–99)
Potassium: 4.5 mEq/L (ref 3.7–5.3)
Sodium: 138 mEq/L (ref 137–147)

## 2013-10-02 LAB — CBC
HCT: 40.4 % (ref 36.0–46.0)
HEMOGLOBIN: 12.7 g/dL (ref 12.0–15.0)
MCH: 29.2 pg (ref 26.0–34.0)
MCHC: 31.4 g/dL (ref 30.0–36.0)
MCV: 92.9 fL (ref 78.0–100.0)
Platelets: 271 10*3/uL (ref 150–400)
RBC: 4.35 MIL/uL (ref 3.87–5.11)
RDW: 12.3 % (ref 11.5–15.5)
WBC: 11.8 10*3/uL — ABNORMAL HIGH (ref 4.0–10.5)

## 2013-10-02 LAB — LIPID PANEL
CHOLESTEROL: 183 mg/dL (ref 0–200)
HDL: 42 mg/dL (ref >39)
LDL Cholesterol: 116 mg/dL — ABNORMAL HIGH (ref 0–99)
Total CHOL/HDL Ratio: 4.4 RATIO
Triglycerides: 127 mg/dL (ref <150)
VLDL: 25 mg/dL (ref 0–40)

## 2013-10-02 LAB — GLUCOSE, CAPILLARY
Glucose-Capillary: 270 mg/dL — ABNORMAL HIGH (ref 70–99)
Glucose-Capillary: 167 mg/dL — ABNORMAL HIGH (ref 70–99)
Glucose-Capillary: 212 mg/dL — ABNORMAL HIGH (ref 70–99)
Glucose-Capillary: 432 mg/dL — ABNORMAL HIGH (ref 70–99)

## 2013-10-02 MED ORDER — INSULIN ASPART 100 UNIT/ML ~~LOC~~ SOLN
5.0000 [IU] | Freq: Three times a day (TID) | SUBCUTANEOUS | Status: DC
  Administered 2013-10-02 – 2013-10-03 (×3): 5 [IU] via SUBCUTANEOUS

## 2013-10-02 MED ORDER — INSULIN ASPART 100 UNIT/ML ~~LOC~~ SOLN: 5.0000 [IU] | Freq: Three times a day (TID) | SUBCUTANEOUS | Status: DC

## 2013-10-02 MED ORDER — LEVOFLOXACIN 500 MG PO TABS
500.0000 mg | ORAL_TABLET | Freq: Every day | ORAL | Status: DC
  Administered 2013-10-03: 500 mg via ORAL
  Filled 2013-10-02: qty 1

## 2013-10-02 MED ORDER — PREDNISONE 20 MG PO TABS
40.0000 mg | ORAL_TABLET | Freq: Every day | ORAL | Status: DC
  Filled 2013-10-02 (×2): qty 2

## 2013-10-02 MED ORDER — ALBUTEROL SULFATE HFA 108 (90 BASE) MCG/ACT IN AERS
2.0000 | INHALATION_SPRAY | RESPIRATORY_TRACT | Status: DC | PRN
  Filled 2013-10-02: qty 6.7

## 2013-10-02 NOTE — Progress Notes (Signed)
Physical Therapy Treatment Patient Details Name: Makayla Rodriguez MRN: [id number] DOB: 09-08-1931 Today's Date: 10/02/2013    History of Present Illness Pt is a 80 year old female admitted for acute respiratory failure likely secondary to copd exacerbation and acute on chronic diastolic heart failure.    PT Comments    Pt only agreeable to ambulate into bathroom.  Nsg tech reports pt not agreeable to get out of chair earlier despite brief saturated in urine.  Nsg tech entered with PT, and pt agreeable to get out of chair and into bathroom for hygiene (declined any further ambulation).  Pt's caregiver provided bathing upon pt sitting on toilet and then pt assisted back to chair.     Follow Up Recommendations  No PT follow up;Supervision/Assistance - 24 hour     Equipment Recommendations  None recommended by PT    Recommendations for Other Services       Precautions / Restrictions Precautions Precautions: Fall Precaution Comments: O2 at home    Mobility  Bed Mobility               General bed mobility comments: pt sitting in recliner  Transfers Overall transfer level: Needs assistance Equipment used: Rolling walker (2 wheeled) Transfers: Sit to/from Stand Sit to Stand: Min assist         General transfer comment: close guard for safety, min/guard from recliner, min assist from lower toilet with cues for use of grab bar  Ambulation/Gait Ambulation/Gait assistance: Min guard Ambulation Distance (Feet): 20 Feet Assistive device: Rolling walker (2 wheeled) Gait Pattern/deviations: Step-through pattern;Decreased stride length;Wide base of support Gait velocity: decr   General Gait Details: Pt declined to ambulate in hallway and only agreeable to ambulate into bathroom, slow gait speed.    Stairs            Wheelchair Mobility    Modified Rankin (Stroke Patients Only)       Balance                                    Cognition  Arousal/Alertness: Awake/alert Behavior During Therapy: WFL for tasks assessed/performed Overall Cognitive Status: Within Functional Limits for tasks assessed                      Exercises      General Comments        Pertinent Vitals/Pain SpO2 on 2L at rest 92% SpO2 86% on room air upon ambulating to bathroom however increased to 88% with pursed lip breathing O2 [place] reapplied upon return to [place]                      Prior Function            PT Goals (current goals can now be found in the care plan section) Progress towards PT goals: Progressing toward goals    Frequency  Min 3X/week    PT Plan Current plan remains appropriate    Co-evaluation             End of Session   Activity Tolerance: Patient limited by fatigue Patient left: in chair;with call bell/phone within reach;with family/visitor present;with nursing/sitter in room     Time: 7026-3785 PT Time Calculation (min): 27 min  Charges:  $Gait Training: 8-22 mins  G CodesJunius Argyle 10/02/2013, 3:31 PM Carmelia Bake, PT, DPT 10/02/2013 Pager: [phone]

## 2013-10-02 NOTE — Progress Notes (Signed)
Results for CAMDEN, MAZZAFERRO (MRN [id number]) as of 10/02/2013 12:55  Ref. Range 10/01/2013 11:31 10/01/2013 17:21 10/01/2013 22:04 10/02/2013 07:25 10/02/2013 11:51  Glucose-Capillary Latest Range: 70-99 mg/dL 224 (H) 307 (H) 270 (H) 167 (H) 212 (H)   Noted that postprandial blood sugars greater than 180 mg/dl.  Recommend adding Novolog 3 units TID with meals for meal coverage if patient eats at least 50% of meals.  Continue Novolog MODERATE correction scale AC & HS as ordered.   Will continue to follow while in hospital.  Harvel Ricks RN BSN CDE

## 2013-10-02 NOTE — Care Management Note (Addendum)
    Page 1 of 1   10/03/2013     12:16:10 PM CARE MANAGEMENT NOTE 10/03/2013  Patient:  Makayla Rodriguez, Makayla Rodriguez   Account Number:  [id number]  Date Initiated:  09/28/2013  Documentation initiated by:  [facility]  Subjective/Objective Assessment:   CHF     Action/Plan:   oxygen at home   Anticipated DC Date:  10/03/2013   Anticipated DC Plan:  [place]  CM consult      Choice offered to / List presented to:  C-1 Patient           Status of service:  Completed, signed off Medicare Important Message given?  YES (If response is "NO", the following Medicare IM given date fields will be blank) Date Medicare IM given:  09/28/2013 Date Additional Medicare IM given:  10/02/2013  Discharge Disposition:  HOME/SELF CARE  Per UR Regulation:  Reviewed for med. necessity/level of care/duration of stay  If discussed at [place] of Stay Meetings, dates discussed:   10/03/2013    Comments:  10/03/13 Demico Ploch RN,BSN NCM [phone] D/C HOME.HAS HOME 02,& TRAVEL.HAS PRIVATE CAREGIVERS ALREADY.NO D/C NEEDS OR ORDERS.  10/02/13 Aadvik Roker RN,BSN NCM [phone] PATIENT FEELS SHE HAS PRIVATE CAREGIVERS THAT CAN PROVIDE ALL THE ASSISTANCE SHE NEEDS.  09/30/13 Tona Qualley RN,BSN NCM [phone] PATIENT HAS PRIVATE SITTER 8HR/DY/7 DAYS/WK.USED [place] IN PAST AGREE TO USE THEM AGAIN IF [place].TC DEBBIE [place] FOR REFERRAL.AWAIT FINAL HHC ORDER.WOULD RECOMMEND PT CONS.USES [facility] FOR HOME 02.COPD,CHF-IV ABX,IV SAOLUMEDROL,02.

## 2013-10-02 NOTE — Plan of Care (Signed)
Problem: Phase II Progression Outcomes Goal: Activity at appropriate level-compared to baseline (UP IN CHAIR FOR HEMODIALYSIS)  Outcome: Not Progressing Pt refuses to ambulate. Pt also refused PT.

## 2013-10-02 NOTE — Progress Notes (Signed)
Pt pre-dinner blood glucose was 432. Md made aware and MD did not want stat serum glucose. MD ordered additional 5 units of insulin with meals. Pt was asymptomatic.

## 2013-10-02 NOTE — Progress Notes (Addendum)
[facility] PROGRESS NOTE  Makayla Rodriguez YQI:[id number] DOB: 1932/03/27 DOA: 09/28/2013 PCP: Marjorie Smolder, MD Interim summary: Makayla Rodriguez is a 80 y.o. female with a past medical history of Diabetes mellitus without complication; Hypertension; Anxiety; Arthritis; Obesity; CKD (chronic kidney disease), stage III; Asthma; Shortness of breath; CHF (congestive heart failure); GERD (gastroesophageal reflux disease); Presented with 2 week hx of worsening cough productive of white thick phlegm and worsening shortness of breath. She was found to be febrile 100.7 in ED. Hospitalist was called for admission for COPD exacerbation vs early PNA  Assessment/Plan: Acute respiratory failure probably secondary to Bronchitis/Reactive airway disease and  ACUTE on chronic diastolic heart failure.  - Improving, treated with IV solumedrol, nebs, levaquin -Also given IV lasix, now on PO torsemide -change to PO levaquin in am -taper prednisone -Echocardiogram showed grade 2 diastolic dysfunction with elevated pulmonary pressures.   Diabetes mellitus: -CBGs up due to steroids -hbaic 8.6 -add meal coverage novolog  Hypertension: - controlled.   OSA:  ON CPAP at night.   Chronic lymphedema -continue therapy for this  CKD -stable, improved  DVT prophylaxis. lovenox  Code Status: full code Family Communication: family and caregiver at bedside Disposition Plan: home tomorrow if stable.    Consultants:  none  Procedures:  echo  Antibiotics:  levaquin 5/2  HPI/Subjective: Feel much better than yesterday .coughing has improved. Discussed about possible discharge tomorrow.   Objective: Filed Vitals:   10/02/13 0600  BP: 138/72  Pulse: 68  Temp: 97.5 F (36.4 C)  Resp: 20    Intake/Output Summary (Last 24 hours) at 10/02/13 1343 Last data filed at 10/02/13 0900  Gross per 24 hour  Intake    750 ml  Output      0 ml  Net    750 ml   Filed Weights   09/30/13 0726  10/01/13 0603 10/02/13 0600  Weight: 128.7 kg (283 lb 11.7 oz) 130.6 kg (287 lb 14.7 oz) 128.9 kg (284 lb 2.8 oz)    Exam:   General:  Alert afebrile comfortable  Cardiovascular: s1s2 tachycardic.   Respiratory: ctab  Abdomen: soft NT ND BS+  Musculoskeletal: no pedal edema.  Data Reviewed: Basic Metabolic Panel:  Recent Labs Lab 09/28/13 0147 09/28/13 0712 09/29/13 0535 09/30/13 0408 10/01/13 1122 10/02/13 0430  NA 140  --  138 138 139 138  K 3.9  --  5.4* 5.0 4.4 4.5  CL 93*  --  95* 92* 93* 92*  CO2 34*  --  34* 37* 39* 37*  GLUCOSE 174*  --  228* 261* 232* 177*  BUN 34*  --  50* 63* 58* 57*  CREATININE 0.91 0.93 1.09 1.12* 1.00 0.98  CALCIUM 9.9  --  9.8 9.4 9.5 9.7  MG  --   --  2.3  --   --   --   PHOS  --   --  4.0  --   --   --    Liver Function Tests:  Recent Labs Lab 09/29/13 0535  AST 16  ALT 16  ALKPHOS 75  BILITOT <0.2*  PROT 7.3  ALBUMIN 2.8*   No results found for this basename: LIPASE, AMYLASE,  in the last 168 hours No results found for this basename: AMMONIA,  in the last 168 hours CBC:  Recent Labs Lab 09/28/13 0147 09/28/13 0712 09/29/13 0535 10/02/13 0430  WBC 15.5* 12.5* 10.5 11.8*  NEUTROABS 11.6*  --  8.9*  --   HGB 13.0 12.2 12.0  12.7  HCT 41.1 38.6 38.7 40.4  MCV 94.5 93.2 93.7 92.9  PLT 240 221 242 271   Cardiac Enzymes:  Recent Labs Lab 09/28/13 0147 09/28/13 0712 09/28/13 1252  TROPONINI <0.30 <0.30 <0.30   BNP (last 3 results)  Recent Labs  09/28/13 0147 09/29/13 1024  PROBNP 1416.0* 2609.0*   CBG:  Recent Labs Lab 10/01/13 1131 10/01/13 1721 10/01/13 2204 10/02/13 0725 10/02/13 1151  GLUCAP 224* 307* 270* 167* 212*    No results found for this or any previous visit (from the past 240 hour(s)).   Studies: No results found.  Scheduled Meds: . aspirin EC  81 mg Oral q morning - 10a  . docusate sodium  100 mg Oral BID  . enoxaparin (LOVENOX) injection  40 mg Subcutaneous Q24H  .  fluticasone  2 spray Each Nare Daily  . insulin aspart  0-15 Units Subcutaneous TID WC  . insulin aspart  0-5 Units Subcutaneous QHS  . ipratropium  0.5 mg Nebulization BID  . [START ON 10/03/2013] levofloxacin  500 mg Oral Daily  . loratadine  10 mg Oral Daily  . nebivolol  5 mg Oral Daily  . pantoprazole  40 mg Oral q morning - 10a  . [START ON 10/03/2013] predniSONE  40 mg Oral QAC breakfast  . sodium chloride  3 mL Intravenous Q12H  . sodium chloride  3 mL Intravenous Q12H  . torsemide  20 mg Oral Daily   Continuous Infusions:   Active Problems:   Acute diastolic heart failure, NYHA class 2   Diabetes mellitus   Hypertension   OSA (obstructive sleep apnea)   Cancer of upper-outer quadrant of female breast   Dyspnea   COPD exacerbation    Time spent: 35 min    [facility] Pager [phone]. If 7PM-7AM, please contact night-coverage at [email or web address], password [facility] 10/02/2013, 1:43 PM  LOS: 4 days

## 2013-10-02 NOTE — Progress Notes (Signed)
OT Cancellation Note  Patient Details Name: Makayla Rodriguez MRN: [id number] DOB: 11-28-1931   Cancelled Treatment:    Reason Eval/Treat Not Completed: Patient declined, no reason specified. When asked about getting up, pt states, "not right now." Pt states she has been up earlier with caregiver. Encouraged pt to get up a little with therapy but pt still declining.   [name] Amrit Cress [phone] 10/02/2013, 11:35 AM

## 2013-10-03 DIAGNOSIS — J309 Allergic rhinitis, unspecified: Secondary | ICD-10-CM

## 2013-10-03 LAB — GLUCOSE, CAPILLARY
GLUCOSE-CAPILLARY: 132 mg/dL — AB (ref 70–99)
Glucose-Capillary: 262 mg/dL — ABNORMAL HIGH (ref 70–99)
Glucose-Capillary: 111 mg/dL — ABNORMAL HIGH (ref 70–99)

## 2013-10-03 MED ORDER — PREDNISONE 10 MG PO TABS: 10.0000 mg | ORAL_TABLET | Freq: Every day | ORAL | Status: DC

## 2013-10-03 MED ORDER — PREDNISONE 20 MG PO TABS
20.0000 mg | ORAL_TABLET | Freq: Every day | ORAL | Status: DC
  Administered 2013-10-03: 20 mg via ORAL
  Filled 2013-10-03 (×2): qty 1

## 2013-10-03 MED ORDER — LEVOFLOXACIN 500 MG PO TABS: 500.0000 mg | ORAL_TABLET | Freq: Every day | ORAL | Status: DC

## 2013-10-03 MED ORDER — ALBUTEROL SULFATE HFA 108 (90 BASE) MCG/ACT IN AERS: 2.0000 | INHALATION_SPRAY | RESPIRATORY_TRACT | Status: DC | PRN

## 2013-10-03 NOTE — Progress Notes (Signed)
Came to visit patient at bedside to offer [facility] [employer] services. She reports she has been called before in the past about [facility] and did not follow up as she was not interested. Reports she still is not interested at this time. Offered to explain services again. She declined hearing anymore and accepted a brochure with contact number to call in future if she thinks services are needed. Patient's caregiver at bedside.  Marthenia Rolling, MSN- [facility] [phone]

## 2013-10-03 NOTE — Progress Notes (Signed)
Patient discharged to home via wheelchair with caregiver, discharge instructions reviewed with patient who verbalized understanding. New RX's were sent to pharmacy.

## 2013-10-03 NOTE — Discharge Summary (Signed)
Physician Discharge Summary  Makayla Rodriguez DTO:[id number] DOB: September 12, 1931 DOA: 09/28/2013  PCP: Marjorie Smolder, MD  Admit date: 09/28/2013 Discharge date: 10/03/2013  Time spent: 45 minutes  Recommendations for Outpatient Follow-up:  1. PCP in 1 week  Discharge Diagnoses:  Active Problems:   Acute diastolic heart failure, NYHA class 2   Bronchitis/Reactive airway disease   Diabetes mellitus   Hypertension   OSA (obstructive sleep apnea)   Cancer of upper-outer quadrant of female breast   Dyspnea   COPD exacerbation   Discharge Condition: stable  Diet recommendation: low sodium  Filed Weights   10/01/13 0603 10/02/13 0600 10/03/13 0600  Weight: 130.6 kg (287 lb 14.7 oz) 128.9 kg (284 lb 2.8 oz) 126.4 kg (278 lb 10.6 oz)    History of present illness:  Makayla Rodriguez is a 80 y.o. female  has a past medical history of Diabetes mellitus without complication; Hypertension; Anxiety; Arthritis; Obesity; CKD (chronic kidney disease), stage III; COPD (chronic obstructive pulmonary disease); Shortness of breath; CHF (congestive heart failure); GERD (gastroesophageal reflux disease); Seasonal allergies; Complication of anesthesia; and Sleep apnea.  Presented with  2 week hx of worsening cough productive of white thick phlegm. Worsening shortness of breath. For the past few days she have had subjective fever and in ER up to 100.7. CXR did not show any acute changes. Denies any muscle aches. Patient is on 2 L of oxygen at baseline with #l at night with CPAP.  Patient states she stopped taking her Torsemide for few days because it was making her urinate too much.   Hospital Course:  Acute respiratory failure  secondary to Bronchitis/Reactive airway disease and ACUTE on chronic diastolic heart failure.  - Improving, treated with IV solumedrol, nebs, levaquin  -Also given IV lasix, now on PO torsemide  -clinically improved, 1 more day of levaquin to complete 7day course  -tapered prednisone  qucikly -Echocardiogram showed grade 2 diastolic dysfunction with elevated pulmonary pressures.   Diabetes mellitus:  -CBGs up due to steroids  -hbaic 8.6  -was on SSI in hospital  Hypertension:  - controlled.   OSA:  -CPAP QHS  Chronic lymphedema  -continue therapy for this   CKD  -stable, improved   Discharge Exam: Filed Vitals:   10/03/13 0600  BP: 140/57  Pulse: 54  Temp: 97.7 F (36.5 C)  Resp: 20    General: AAOx3 Cardiovascular: S1S2/RRR Respiratory: CTAB  Discharge Instructions You were cared for by a hospitalist during your hospital stay. If you have any questions about your discharge medications or the care you received while you were in the hospital after you are discharged, you can call the unit and asked to speak with the hospitalist on call if the hospitalist that took care of you is not available. Once you are discharged, your primary care physician will handle any further medical issues. Please note that NO REFILLS for any discharge medications will be authorized once you are discharged, as it is imperative that you return to your primary care physician (or establish a relationship with a primary care physician if you do not have one) for your aftercare needs so that they can reassess your need for medications and monitor your lab values.  Discharge Orders   Future Appointments Provider Department Dept Phone   10/17/2013 10:45 AM Adin Hector, MD [facility] [place]   11/27/2013 1:30 PM Deatra Robinson, [facility] [phone]   02/19/2014 10:00 AM Tammy Jeralene Huff,  NP [facility] [phone]   Future Orders Complete By Expires   Amb Referral to Nutrition and Diabetic E  As directed    Diet - low sodium heart healthy  As directed    Diet Carb Modified  As directed    Increase activity slowly  As directed        Medication List         acetaminophen 500 MG tablet  Commonly known  as:  TYLENOL  Take 500 mg by mouth every 6 (six) hours as needed.     ALPRAZolam 0.5 MG tablet  Commonly known as:  XANAX  Take 0.25 mg by mouth 2 (two) times daily as needed for sleep.     aspirin EC 81 MG tablet  Take 81 mg by mouth every morning.     cholecalciferol 1000 UNITS tablet  Commonly known as:  VITAMIN D  Take 1,000 Units by mouth every morning.     Chromium-Cinnamon [phone] MCG-MG Caps  Take 1 tablet by mouth at bedtime.     diclofenac sodium 1 % Gel  Commonly known as:  VOLTAREN  Apply 4 g topically 2 (two) times daily as needed (for joint pain). For joint pain     docusate sodium 100 MG capsule  Commonly known as:  COLACE  Take 100-200 mg by mouth at bedtime as needed for constipation.     fexofenadine 180 MG tablet  Commonly known as:  ALLEGRA  Take 180 mg by mouth at bedtime.     fluticasone 50 MCG/ACT nasal spray  Commonly known as:  FLONASE  Place 2 sprays into the nose daily.     glipiZIDE 10 MG 24 hr tablet  Commonly known as:  GLUCOTROL XL  Take 10 mg by mouth every morning.     hydroxypropyl methylcellulose 2.5 % ophthalmic solution  Commonly known as:  ISOPTO TEARS  Place 1 drop into both eyes 3 (three) times daily as needed (for dry eyes).     levofloxacin 500 MG tablet  Commonly known as:  LEVAQUIN  Take 1 tablet (500 mg total) by mouth daily. For 1 day     lisinopril 5 MG tablet  Commonly known as:  PRINIVIL,ZESTRIL  Take 5 mg by mouth daily.     nebivolol 5 MG tablet  Commonly known as:  BYSTOLIC  Take 5 mg by mouth daily.     pantoprazole 40 MG tablet  Commonly known as:  PROTONIX  Take 40 mg by mouth every morning.     polyethylene glycol packet  Commonly known as:  MIRALAX / GLYCOLAX  Take 17 g by mouth daily as needed (for constipation).     predniSONE 10 MG tablet  Commonly known as:  DELTASONE  Take 1 tablet (10 mg total) by mouth daily before breakfast. For 1 day     torsemide 20 MG tablet  Commonly known as:   DEMADEX  Take 20 mg by mouth daily.     TOZAL PO  Take 3 tablets by mouth daily. For eyes     traMADol 50 MG tablet  Commonly known as:  ULTRAM  Take 50 mg by mouth 3 (three) times daily as needed (for pain.).     TUSSIN DM PO  Take 5 mLs by mouth as needed (for cough).       Allergies  Allergen Reactions  . Demerol [Meperidine] Anaphylaxis    Reaction: Hypoventilation after surgery  . Penicillins Anaphylaxis  . Phenergan [Promethazine Hcl] Anaphylaxis  Reaction: hypoventilation  . Codeine Nausea And Vomiting  . Macrobid [Nitrofurantoin] Nausea And Vomiting  . Relafen [Nabumetone] Itching and Swelling       Follow-up Information   Follow up with Marjorie Smolder, MD. Schedule an appointment as soon as possible for a visit in 1 week.   Specialty:  Family Medicine   Contact information:   [address] [place] [address] [phone]        The results of significant diagnostics from this hospitalization (including imaging, microbiology, ancillary and laboratory) are listed below for reference.    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  Increased interstitial markings, favored to be chronic. No pleural effusion or pneumothorax.  Mild cardiomegaly.  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  No evidence of acute cardiopulmonary disease.   Electronically Signed   By: Julian Hy M.D.   On: 09/28/2013 01:17   Ct Chest Wo Contrast  09/05/2013   CLINICAL DATA:  Followup pleural mass  EXAM: CT CHEST WITHOUT CONTRAST  TECHNIQUE: Multidetector CT  imaging of the chest was performed following the standard protocol without IV contrast.  COMPARISON:  05/14/2012 and 11/27/2012  FINDINGS: The heart size appears mildly enlarged. No pericardial effusion. Mitral valve and aortic valve calcifications are identified. Atherosclerotic disease involves the thoracic aorta as well as the LAD coronary artery. No pathologically enlarged mediastinal or hilar lymph nodes identified. There is no axillary or supraclavicular adenopathy identified.  No pleural effusion identified. As on the previous examination there is a smoothly marginated lenticular shaped pleural based lesion in the inferior lateral aspect of the left hemi thorax. This measures 5.2 x 2.9 cm, image 38/ series 2. This is stable from the previous exam. Two pleural based calcifications in the inferior aspect of the left hemi thorax are unchanged. Left lower lobe pulmonary nodule is unchanged measuring 6 mm, image 33/series 3. The adjacent 5 mm subpleural nodule is also unchanged, image 32/series 3. Stable appearance of 3 mm right upper lobe nodule, image 22/series 3.  Incidental imaging through the upper abdomen shows no acute findings. There is a adenoma within the right adrenal gland and a cyst within the posterior right hepatic lobe.  Multi level spondylosis is noted within the stress set mild multi level degenerative disc disease noted within the thoracic spine. No aggressive lytic or sclerotic bone lesion.  IMPRESSION: 1. No acute findings. 2. 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 Stable small pulmonary nodules measuring 6 mm. In a patient that is at increased risk for lung cancer the next followup examination should be obtained on 11/27/2013. This recommendation follows the consensus statement: Guidelines for Management of Small Pulmonary Nodules Detected on CT Scans: A Statement from the [employer] as published in Radiology 2005; 237:395-400. Marland Kitchen 4. Atherosclerotic disease including coronary artery calcifications.   Electronically Signed   By: Kerby Moors M.D.   On: 09/05/2013 11:35    Microbiology: No results found for  this or any previous visit (from the past 240 hour(s)).   Labs: Basic Metabolic Panel:  Recent Labs Lab 09/28/13 0147 09/28/13 0712 09/29/13 0535 09/30/13 0408 10/01/13 1122 10/02/13 0430  NA 140  --  138 138 139 138  K 3.9  --  5.4* 5.0 4.4 4.5  CL 93*  --  95* 92* 93* 92*  CO2 34*  --  34* 37* 39* 37*  GLUCOSE 174*  --  228* 261* 232* 177*  BUN 34*  --  50* 63* 58* 57*  CREATININE 0.91 0.93 1.09 1.12* 1.00 0.98  CALCIUM 9.9  --  9.8 9.4 9.5 9.7  MG  --   --  2.3  --   --   --   PHOS  --   --  4.0  --   --   --    Liver Function Tests:  Recent Labs Lab 09/29/13 0535  AST 16  ALT 16  ALKPHOS 75  BILITOT <0.2*  PROT 7.3  ALBUMIN 2.8*   No results found for this basename: LIPASE, AMYLASE,  in the last 168 hours No results found for this basename: AMMONIA,  in the last 168 hours CBC:  Recent Labs Lab 09/28/13 0147 09/28/13 0712 09/29/13 0535 10/02/13 0430  WBC 15.5* 12.5* 10.5 11.8*  NEUTROABS 11.6*  --  8.9*  --   HGB 13.0 12.2 12.0 12.7  HCT 41.1 38.6 38.7 40.4  MCV 94.5 93.2 93.7 92.9  PLT 240 221 242 271   Cardiac Enzymes:  Recent Labs Lab 09/28/13 0147 09/28/13 0712 09/28/13 1252  TROPONINI <0.30 <0.30 <0.30   BNP: BNP (last 3 results)  Recent Labs  09/28/13 0147 09/29/13 1024  PROBNP 1416.0* 2609.0*   CBG:  Recent Labs Lab 10/02/13 0725 10/02/13 1151 10/02/13 1709 10/02/13 2159 10/03/13 0719  GLUCAP 167* 212* 432* 262* 111*       Signed:  Domenic Polite  [facility] 10/03/2013, 12:00 PM

## 2013-10-03 NOTE — Progress Notes (Signed)
OT Cancellation Note  Patient Details Name: Makayla Rodriguez MRN: [id number] DOB: 1932-03-08   Cancelled Treatment:    Reason Eval/Treat Not Completed: Patient declined, no reason specified OT did provide encouragement. Pt stated maybe later.  Betsy Pries 10/03/2013, 11:14 AM

## 2013-10-10 DIAGNOSIS — E1129 Type 2 diabetes mellitus with other diabetic kidney complication: Secondary | ICD-10-CM | POA: Diagnosis not present

## 2013-10-10 DIAGNOSIS — E1165 Type 2 diabetes mellitus with hyperglycemia: Secondary | ICD-10-CM | POA: Diagnosis not present

## 2013-10-10 DIAGNOSIS — J449 Chronic obstructive pulmonary disease, unspecified: Secondary | ICD-10-CM | POA: Diagnosis not present

## 2013-10-10 DIAGNOSIS — E7800 Pure hypercholesterolemia, unspecified: Secondary | ICD-10-CM | POA: Diagnosis not present

## 2013-10-10 DIAGNOSIS — I5030 Unspecified diastolic (congestive) heart failure: Secondary | ICD-10-CM | POA: Diagnosis not present

## 2013-10-17 ENCOUNTER — Ambulatory Visit (INDEPENDENT_AMBULATORY_CARE_PROVIDER_SITE_OTHER): Payer: Medicare Other | Admitting: General Surgery

## 2013-10-25 ENCOUNTER — Encounter: Payer: Self-pay | Admitting: *Deleted

## 2013-10-31 ENCOUNTER — Encounter (INDEPENDENT_AMBULATORY_CARE_PROVIDER_SITE_OTHER): Payer: Self-pay | Admitting: General Surgery

## 2013-10-31 ENCOUNTER — Ambulatory Visit (INDEPENDENT_AMBULATORY_CARE_PROVIDER_SITE_OTHER): Payer: Medicare Other | Admitting: General Surgery

## 2013-10-31 ENCOUNTER — Telehealth: Payer: Self-pay | Admitting: Oncology

## 2013-10-31 VITALS — BP 124/78 | HR 72 | Temp 97.6°F | Resp 16 | Wt 275.0 lb

## 2013-10-31 DIAGNOSIS — C50419 Malignant neoplasm of upper-outer quadrant of unspecified female breast: Secondary | ICD-10-CM

## 2013-10-31 NOTE — Progress Notes (Signed)
Patient ID: Makayla Rodriguez, female   DOB: 07/03/1931, 80 y.o.   MRN: [id number] History:  This patient underwent left partial mastectomy with needle localization and reexcision of superior margins on 04/05/2013. Considering the reexcision, her margins are widely negative. She has invasive ductal carcinoma, 1.6 cm diameter. Negative margins. ER 100%, PR 0%, HER-2/neu negative. Ki-67 51%. Pathologic stage T1c, Nx.. we have discussed this in tumor board  and the agreement was she should be placed on antiestrogen therapy. She is too disabled in a wheelchair to tolerate radiation therapy. She has seen. Dr. Marcy Panning and we thought she was going to call in an antiestrogen prescription for her, but apparently that was never done. I told the patient that she needs to be on antiestrogen medications since she did not receive radiation therapy. She understands this. She states that they called her today and reassigned her to Dr. Marko Plume I encouraged her to keep that appointment next week. She is having no problems with breast wound healing.    Past history, family history, social history, and review of systems are documented on the chart, unchanged, and noncontributory except as described above.  Exam:  Patient  Looks good. Caregiver with her.. No distress. In a wheelchair as usual, on oxygen  Left breast wound, upper outer quadrant is healing normally. Normal amount of thickening and fullness. No infection.  No adenopathy Lungs clear to auscultation bilaterally Heart regular rate and rhythm. No ectopy  Assessment:  Invasive ductal carcinoma left breast, upper outer quadrant, 1.6 cm, ER 100%, PR 0, HER-2-negative, pathologic stage TI C., NX  Recovering uneventfully following left partial mastectomy  Oxygen-dependent sleep apnea  Obesity hypoventilation syndrome  Severe osteoarthritis  Non-insulin-dependent diabetes mellitus  Hypertension   Plan:  She was instructed to keep her appointment with Dr.  Marko Plume next week. She understands the importance of this  Return to see me in 6 months  Bilateral mammograms in 6 months      Ceferino Lang M. Dalbert Batman, M.D., [facility]  General and Minimally invasive Surgery  Breast and Colorectal Surgery  Office: [phone]  Pager: [phone]

## 2013-10-31 NOTE — Patient Instructions (Signed)
Examination of your breast and lymph nodes today is normal. There is no evidence of cancer.  Since you did not get radiation therapy, it is very important that a decision is made for you to start antiestrogen medication.  Therefore, it is important to keep your appointment with Dr. Marko Plume at the cancer center  Be sure to get your mammograms in October of this year  Return to see Dr. Dalbert Batman in November of this year.

## 2013-10-31 NOTE — Telephone Encounter (Signed)
, °

## 2013-11-03 ENCOUNTER — Other Ambulatory Visit: Payer: Self-pay | Admitting: Oncology

## 2013-11-03 DIAGNOSIS — C50419 Malignant neoplasm of upper-outer quadrant of unspecified female breast: Secondary | ICD-10-CM

## 2013-11-04 ENCOUNTER — Telehealth: Payer: Self-pay | Admitting: Oncology

## 2013-11-04 NOTE — Telephone Encounter (Signed)
added lab to 6/12 f/u. lmonvm for pt and mailed schedule.

## 2013-11-06 DIAGNOSIS — Z01419 Encounter for gynecological examination (general) (routine) without abnormal findings: Secondary | ICD-10-CM | POA: Diagnosis not present

## 2013-11-08 ENCOUNTER — Other Ambulatory Visit (HOSPITAL_BASED_OUTPATIENT_CLINIC_OR_DEPARTMENT_OTHER): Payer: Medicare Other

## 2013-11-08 ENCOUNTER — Telehealth: Payer: Self-pay | Admitting: Oncology

## 2013-11-08 ENCOUNTER — Ambulatory Visit (HOSPITAL_BASED_OUTPATIENT_CLINIC_OR_DEPARTMENT_OTHER): Payer: Medicare Other | Admitting: Oncology

## 2013-11-08 ENCOUNTER — Encounter: Payer: Self-pay | Admitting: Oncology

## 2013-11-08 VITALS — BP 146/84 | HR 98 | Temp 98.0°F | Resp 18 | Ht 63.0 in

## 2013-11-08 DIAGNOSIS — E119 Type 2 diabetes mellitus without complications: Secondary | ICD-10-CM | POA: Diagnosis not present

## 2013-11-08 DIAGNOSIS — J449 Chronic obstructive pulmonary disease, unspecified: Secondary | ICD-10-CM

## 2013-11-08 DIAGNOSIS — C50419 Malignant neoplasm of upper-outer quadrant of unspecified female breast: Secondary | ICD-10-CM

## 2013-11-08 DIAGNOSIS — Z170 Estrogen receptor positive status [ER+]: Secondary | ICD-10-CM

## 2013-11-08 LAB — COMPREHENSIVE METABOLIC PANEL (CC13)
ALT: 9 U/L (ref 0–55)
AST: 11 U/L (ref 5–34)
Albumin: 3.6 g/dL (ref 3.5–5.0)
Alkaline Phosphatase: 58 U/L (ref 40–150)
Anion Gap: 10 mEq/L (ref 3–11)
BUN: 36.7 mg/dL — AB (ref 7.0–26.0)
CALCIUM: 9.5 mg/dL (ref 8.4–10.4)
CHLORIDE: 101 meq/L (ref 98–109)
CO2: 32 mEq/L — ABNORMAL HIGH (ref 22–29)
Creatinine: 1.2 mg/dL — ABNORMAL HIGH (ref 0.6–1.1)
Glucose: 247 mg/dl — ABNORMAL HIGH (ref 70–140)
Potassium: 4.3 mEq/L (ref 3.5–5.1)
Sodium: 142 mEq/L (ref 136–145)
Total Bilirubin: 0.28 mg/dL (ref 0.20–1.20)
Total Protein: 7.2 g/dL (ref 6.4–8.3)

## 2013-11-08 LAB — CBC WITH DIFFERENTIAL/PLATELET
BASO%: 0.8 % (ref 0.0–2.0)
BASOS ABS: 0.1 10*3/uL (ref 0.0–0.1)
EOS%: 1.6 % (ref 0.0–7.0)
Eosinophils Absolute: 0.2 10*3/uL (ref 0.0–0.5)
HEMATOCRIT: 41.6 % (ref 34.8–46.6)
HEMOGLOBIN: 13.3 g/dL (ref 11.6–15.9)
LYMPH#: 2.2 10*3/uL (ref 0.9–3.3)
LYMPH%: 22.8 % (ref 14.0–49.7)
MCH: 29.4 pg (ref 25.1–34.0)
MCHC: 32.1 g/dL (ref 31.5–36.0)
MCV: 91.8 fL (ref 79.5–101.0)
MONO#: 0.7 10*3/uL (ref 0.1–0.9)
MONO%: 7.1 % (ref 0.0–14.0)
NEUT#: 6.6 10*3/uL — ABNORMAL HIGH (ref 1.5–6.5)
NEUT%: 67.7 % (ref 38.4–76.8)
Platelets: 229 10*3/uL (ref 145–400)
RBC: 4.53 10*6/uL (ref 3.70–5.45)
RDW: 13.7 % (ref 11.2–14.5)
WBC: 9.7 10*3/uL (ref 3.9–10.3)

## 2013-11-08 NOTE — Progress Notes (Signed)
OFFICE PROGRESS NOTE   11/08/2013   Physicians: Drue Second, Claud Kelp, S.Michell Heinrich, R.Duaine Dredge, R.Buccini  INTERVAL HISTORY:  Patient is seen, together with sister in law and also accompanied by CNA, at the request of Dr Derrell Lolling for follow up of left breast cancer. This is the first time that I have seen her, previously seen in consultation by Dr Welton Flakes in 03-2013. She has multiple other significant medical problems and poor performance status such that she was not felt to be candidate for radiation following lumpectomy without sentinel node evaluation 03-2013.  Patient has had no problems related to the lumpectomy, saw Dr Derrell Lolling 10-31-2013 and will see him again in 6 months. Last bilateral mammograms were at [facility] 02-26-2013, not extremely dense breast tissue. She had bone density scan at [facility] radiology years ago, does not know result. She has not had hysterectomy, apparently had postmenopausal bleeding in 2014, did have gyn exam by Dr.E.Varnado last week and will see her yearly.  PCP is Dr Shaune Pollack, whom she sees monthly. She was hospitalized in 09-2013 with heart failure and bronchitis. She is on home O2 and CPAP, followed by Dr Vassie Loll.    ONCOLOGIC HISTORY Patient was found to have left breast abnormality on screening mammograms 02-26-2013, biopsy with infiltrating ductal carcinoma ER 100%, PR negative, HER2 negative. Ki67 of 51%. She had lumpectomy without axillary node evaluation by Dr Derrell Lolling on 04-05-2013, path with 1.6 cm grade 2 invasive ductal carcinoma, margins negative. She saw Dr Welton Flakes in consultation initially, with no decision reached then about adjuvant hormonal blockade. She saw Dr Michell Heinrich, with recommendation not to attempt radiation therapy.   Review of systems: Minimally ambulatory at home. Uses O2 nearly continuously. Needs assistance with all ADLs. Breathing at baseline with O2 now. Chronic swelling LE. No noted changes in breasts bilaterally  since the lumpectomy healed. No chest pain now. Significant arthritis LE. Bowels ok. Appetite OK. Denies bleeding now. Remainder of 10 point Review of Systems negative.  Family History Sister with pancreatic cancer. Mother had CHF, CVA No children  Past Medical/ Surgical History Gravida 0, DM, HTN, stage III chronic kidney disease, morbid obesity, CHF, COPD, osteoarthris, degenerative disc disease, GERD, cholecystectomy, uterine fibroids, cataract extraction, umbilical hernia repair, tonsils  Social History: widowed, lives alone with round the clock CNAs assisting. Smoked 0.5 ppd x 40 years, quit 1982. Sister in law is retired Charity fundraiser, and had breast and endometrial cancer herself. Has Living Will and HCPOA. She hopes to resume some water exercise at [facility] if she can tolerate without O2.  Objective:  Vital signs in last 24 hours:  BP 146/84  Pulse 98  Temp(Src) 98 F (36.7 C) (Oral)  Resp 18  Ht 5\' 3"  (1.6 m) Weight 10-31-2013 275 lbs for BMI 48.7. Morbidly obese elderly lady. Alert, oriented and appropriate, good historian, very pleasant. In WC with [place] O2 on.  HEENT:PERRL, sclerae not icteric. Oral mucosa moist without lesions, posterior pharynx clear.  Neck supple. No JVD. TMs clear bilaterally Lymphatics:no cervical,suraclavicular, axillary adenopathy Resp: clear to auscultation bilaterally, diminished BS in bases consistent with habitus. Respirations not labored on [place] O2 Cardio: regular rate and rhythm. No gallop. GI: abdomen obese, soft, nontender, no discernable mass or organomegaly. Normally active bowel sounds.  Musculoskeletal/ Extremities: LE bilaterally with trace - 1+ edema, no cords, tenderness. No swelling LUE. Neuro: speech fluent and appropriate, CN appear intact, moves all extremities in WC. PSYCH: mood and affect appropriate Skin without rash, ecchymosis, petechiae  Breasts: Left with well healed lumpectomy scar in upper outer quadrant, otherwise without dominant mass, skin or  nipple findings bilaterally. Axillae benign.  Lab Results:  Results for orders placed in visit on 11/08/13  CBC WITH DIFFERENTIAL      Result Value Ref Range   WBC 9.7  3.9 - 10.3 10e3/uL   NEUT# 6.6 (*) 1.5 - 6.5 10e3/uL   HGB 13.3  11.6 - 15.9 g/dL   HCT 41.6  34.8 - 46.6 %   Platelets 229  145 - 400 10e3/uL   MCV 91.8  79.5 - 101.0 fL   MCH 29.4  25.1 - 34.0 pg   MCHC 32.1  31.5 - 36.0 g/dL   RBC 4.53  3.70 - 5.45 10e6/uL   RDW 13.7  11.2 - 14.5 %   lymph# 2.2  0.9 - 3.3 10e3/uL   MONO# 0.7  0.1 - 0.9 10e3/uL   Eosinophils Absolute 0.2  0.0 - 0.5 10e3/uL   Basophils Absolute 0.1  0.0 - 0.1 10e3/uL   NEUT% 67.7  38.4 - 76.8 %   LYMPH% 22.8  14.0 - 49.7 %   MONO% 7.1  0.0 - 14.0 %   EOS% 1.6  0.0 - 7.0 %   BASO% 0.8  0.0 - 2.0 %  COMPREHENSIVE METABOLIC PANEL (GG26)      Result Value Ref Range   Sodium 142  136 - 145 mEq/L   Potassium 4.3  3.5 - 5.1 mEq/L   Chloride 101  98 - 109 mEq/L   CO2 32 (*) 22 - 29 mEq/L   Glucose 247 (*) 70 - 140 mg/dl   BUN 36.7 (*) 7.0 - 26.0 mg/dL   Creatinine 1.2 (*) 0.6 - 1.1 mg/dL   Total Bilirubin 0.28  0.20 - 1.20 mg/dL   Alkaline Phosphatase 58  40 - 150 U/L   AST 11  5 - 34 U/L   ALT 9  0 - 55 U/L   Total Protein 7.2  6.4 - 8.3 g/dL   Albumin 3.6  3.5 - 5.0 g/dL   Calcium 9.5  8.4 - 10.4 mg/dL   Anion Gap 10  3 - 11 mEq/L     Studies/Results:  PATHOLOGY Narda Amber Collected: 04/05/2013 Client: [place] Accession: [id number] Received: 04/05/2013 Fanny Skates SIS Diagnosis 1. Breast, lumpectomy, Left - INVASIVE DUCTAL CARCINOMA, SEE COMMENT. - INVASIVE TUMOR IS 1 MM FROM NEAREST MARGIN (SUPERIOR). - PREVIOUS BIOPSY SITE. - SEE TUMOR TEMPLATE BELOW. 2. Breast, excision, Left, superior margin - BENIGN BREAST TISSUE, SEE COMMENT. - NEGATIVE FOR ATYPIA OR MALIGNANCY. - SURGICAL MARGIN, NEGATIVE FOR ATYPIA OR MALIGNANCY. Microscopic Comment 1. BREAST, INVASIVE TUMOR, WITHOUT LYMPH NODE  SAMPLING Specimen, including laterality and lymph node sampling (sentinel, non-sentinel): Left breast without lymph node sampling Procedure: Lumpectomy Histologic type: Ductal Grade: II of III Tubule formation: III Nuclear pleomorphism: II Mitotic: I Tumor size (gross measurement): 1.6 cm Margins: Invasive, distance to closest margin: 1 mm In-situ, distance to closest margin: N/A If margin positive, focally or broadly: N/A Lymphovascular invasion: Absent Ductal carcinoma in situ: Absent Grade: Extensive Extensive intraductal component: N/A Lobular neoplasia: N/A Tumor focality: Unifocal Treatment effect: None If present, treatment effect in breast tissue, lymph nodes or both: N/A  Extent of tumor: Skin: N/A Nipple: N/A Skeletal muscle: N/A Breast prognostic profile: Estrogen receptor: Not repeated, previous study demonstrated 100% positivity ([id number]) Progesterone receptor: Not repeated, previous study demonstrated 0% positivity ([id number]) Her 2 neu: Repeated, previous study demonstrated no  amplification ([phone]) [phone]) Ki-67: Not repeated, previous study demonstrated 51% proliferation rate ([id number]) Non-neoplastic breast: Previous biopsy site, fibrocystic change, columnar cell change / hyperplasia and microcalcifications TNM: pT1c, pNX, pMX Comments: The 1.6 cm invasive carcinoma was located with the ribbon clip. The additional 0.7 cm nodule without a localization clip demonstrated well demarcated area of densely hyalinized and calcified breast with vague papillary features. There is no viable epithelium present. Differential considerations include intraductal papilloma or biphasic lesion (fibroadenoma) completely replaced by dense hyalinization. There are no atypia or malignant features present. (CRR:caf 04/08/13) 2. Representative sections demonstrate predominantly benign fibrovascular and adipose mammary soft tissue. There are no atypical or malignant  features present  Medications: I have reviewed the patient's current medications.  DISCUSSION: I have reviewed all of information above with patient and her sister in law. We have discussed option of adjuvant hormone blocker in attempt to decrease chance that the known ER+ breast cancer would recur and in attempt to decrease chance of other different primary breast cancers. We have discussed fact that, while generally well tolerated, both tamoxifen and aromatase inhibitors have potential side effects, including increased hot flashes, increased risk of blood clots and CVAs, endometrial cancer with tamoxifen, decreased bone density and arthralgias with aromatase inhibitors, more elevated lipids. She understands that she has other risk factors for many of these problems, and certainly performance status is poor at baseline. Patient tells me that, even prior to our discussion now, she has felt very reluctant to pursue hormonal blockers. Patient and sister in law both feel that even low risk of these complications is not acceptable given her age and other comorbidities, and are entirely in agreement with decision to follow with observation only, without attempting either tamoxifen or AI. Patient would like me to continue to see her, which I will do alternating with Dr Darrel Hoover visits, next here after her mammograms in Oct. 2015.  Assessment/Plan:  1.T1Nx grade 2 invasive ductal carcinoma of left breast: ER +, PR and HER2 negative, post lumpectomy 03-2013, not candidate for radiation or hormonal blocker. She will have bilateral diagnostic mammograms at [facility] early Oct 2015, then return visit here as we follow with observation. 2.morbid obesity with BMI >48 3.COPD, long past tobacco, sleep apnea: on home O2 and CPAP 4.DM 5.postmenopausal bleeding 2014: followed by Dr Simona Huh, gyn exam done last week 6.osteoarthritis and degenerative disc disease 7.cholecystectomy, cataract extractions, previous  colonoscopy by Dr Cristina Gong 8.remote bone density scan 9.PS no better than ECOG 3   Time spent 45 min including >50% counseling and coordination of care   Tayona Sarnowski P, MD   11/08/2013, 9:01 PM

## 2013-11-08 NOTE — Telephone Encounter (Signed)
per pof to sch appt & mamma-adv pt of time date & location-pt understood-gave pt copy of the sch

## 2013-11-13 DIAGNOSIS — E1129 Type 2 diabetes mellitus with other diabetic kidney complication: Secondary | ICD-10-CM | POA: Diagnosis not present

## 2013-11-13 DIAGNOSIS — Z23 Encounter for immunization: Secondary | ICD-10-CM | POA: Diagnosis not present

## 2013-11-13 DIAGNOSIS — F411 Generalized anxiety disorder: Secondary | ICD-10-CM | POA: Diagnosis not present

## 2013-11-27 ENCOUNTER — Ambulatory Visit: Payer: Medicare Other | Admitting: Oncology

## 2013-12-04 ENCOUNTER — Encounter: Payer: Self-pay | Admitting: *Deleted

## 2013-12-04 ENCOUNTER — Encounter: Payer: Medicare Other | Attending: Family Medicine | Admitting: *Deleted

## 2013-12-04 VITALS — Ht 63.5 in | Wt 283.2 lb

## 2013-12-04 DIAGNOSIS — E119 Type 2 diabetes mellitus without complications: Secondary | ICD-10-CM | POA: Diagnosis not present

## 2013-12-04 DIAGNOSIS — Z713 Dietary counseling and surveillance: Secondary | ICD-10-CM | POA: Diagnosis not present

## 2013-12-04 NOTE — Patient Instructions (Signed)
Plan:  Aim for 2-3 Carb Choices per meal (30-45 grams) +/- 1 either way  Aim for 0-15 Carbs per snack if hungry  Include protein in moderation with your meals and snacks Consider reading food labels for Total Carbohydrate and Fat Grams of foods Consider  increasing your activity level by doing chair exercises 10 minutes daily as tolerated Consider checking BG at alternate times per day to include fasting, before lunch and 2 hours after 1st bite of dinner directed by MD  Continue taking medication as directed by MD  Have a snack at 10:00 pm every night before going to bed Consider [place] bacon and/or ground [place] Consider [place]

## 2013-12-04 NOTE — Progress Notes (Signed)
Appt start time: 1530 end time:  1700.  Assessment:  Patient was seen on  12/04/13 for individual diabetes education. Patient comes for DSME  To control her glucose and lose weight. She is accompanied by her CNA. Patient is wheel chair confined. Angelic is engaged int he conversation yet has trouble remembering. Her CNA will take the information back to the home and provide the information to the rest of the staff. They are with her around the clock. They do the grocery shopping and food preparation for her. Ms. Mckone has high anxiety concerning her diabetes and generalized health. She worries a great deal about her glucose readings and to potential for going low at night/early morning. For this I have instructed her to have a carb/protein snack every evening at 10:00pm.  Current HbA1c: 8.8% 10/10/13   Down from     9.7%  07/03/13      Test glucose 3 times daily FBS: 73-106 mg/dl       Random glucose range 135 - 247 mg/dl  Preferred Learning Style:   No preference indicated   Learning Readiness:   Change in progress  MEDICATIONS: See List: Actos, glipizide, onglyza  DIETARY INTAKE:  B ( AM):  whole wheat toast, omlette low sodium cheese, onions, mushroom  Snk ( AM): other  L ( PM): other Snk ( PM): sugar free jello D ( PM): grape leaf rolls with meat and rice, vegetable Snk ( PM): Atkins Bar Beverages: water, diet Sierra Mist  Usual physical activity: none, confined to wheel chair. Encouraged wheel chair exercises.   Intervention:  Nutrition counseling provided.  Discussed diabetes disease process and treatment options.  Discussed physiology of diabetes and role of obesity on insulin resistance.  Encouraged moderate weight reduction to improve glucose levels.  Discussed role of medications and diet in glucose control  Provided education on macronutrients on glucose levels.  Provided education on carb counting, importance of regularly scheduled meals/snacks, and meal  planning  Discussed effects of physical activity on glucose levels and long-term glucose control.  Recommended 150 minutes of physical activity/week.  Reviewed patient medications.  Discussed role of medication on blood glucose and possible side effects  Discussed blood glucose monitoring and interpretation.  Discussed recommended target ranges and individual ranges.    Described short-term complications: hyper- and hypo-glycemia.  Discussed causes,symptoms, and treatment options.  Discussed prevention, detection, and treatment of long-term complications.  Discussed the role of prolonged elevated glucose levels on body systems.  Discussed role of stress on blood glucose levels and discussed strategies to manage psychosocial issues.  Discussed recommendations for long-term diabetes self-care.  Established checklist for medical, dental, and emotional self-care.  Plan:  Aim for 2-3 Carb Choices per meal (30-45 grams) +/- 1 either way  Aim for 0-15 Carbs per snack if hungry  Include protein in moderation with your meals and snacks Consider reading food labels for Total Carbohydrate and Fat Grams of foods Consider  increasing your activity level by doing chair exercises 10 minutes daily as tolerated Consider checking BG at alternate times per day to include fasting, before lunch and 2 hours after 1st bite of dinner directed by MD  Continue taking medication as directed by MD  Have a snack at 10:00 pm every night before going to bed Consider [place] bacon and/or ground [place] Consider [facility] Protein Bars  Teaching Method Utilized:  Visual Auditory Hands on  Handouts given during visit include: Living Well with Diabetes Carb Counting and Food Label handouts  Meal Plan Card Snack sheet A1c sheet  Barriers to learning/adherence to lifestyle change: none noted, she has dedicated staff to care for her needs  Diabetes self-care support plan:   [facility] support group  Nurse Aid  staff at home  Demonstrated degree of understanding via:  Teach Back   Monitoring/Evaluation:  Dietary intake, exercise, Test glucose, and body weight prn.

## 2014-01-22 DIAGNOSIS — E1129 Type 2 diabetes mellitus with other diabetic kidney complication: Secondary | ICD-10-CM | POA: Diagnosis not present

## 2014-01-22 DIAGNOSIS — Z6841 Body Mass Index (BMI) 40.0 and over, adult: Secondary | ICD-10-CM | POA: Diagnosis not present

## 2014-01-22 DIAGNOSIS — J301 Allergic rhinitis due to pollen: Secondary | ICD-10-CM | POA: Diagnosis not present

## 2014-01-22 DIAGNOSIS — E1165 Type 2 diabetes mellitus with hyperglycemia: Secondary | ICD-10-CM | POA: Diagnosis not present

## 2014-01-22 DIAGNOSIS — N183 Chronic kidney disease, stage 3 unspecified: Secondary | ICD-10-CM | POA: Diagnosis not present

## 2014-01-22 DIAGNOSIS — I10 Essential (primary) hypertension: Secondary | ICD-10-CM | POA: Diagnosis not present

## 2014-02-06 DIAGNOSIS — H9209 Otalgia, unspecified ear: Secondary | ICD-10-CM | POA: Diagnosis not present

## 2014-02-06 DIAGNOSIS — H579 Unspecified disorder of eye and adnexa: Secondary | ICD-10-CM | POA: Diagnosis not present

## 2014-02-06 DIAGNOSIS — Z23 Encounter for immunization: Secondary | ICD-10-CM | POA: Diagnosis not present

## 2014-02-06 DIAGNOSIS — H9319 Tinnitus, unspecified ear: Secondary | ICD-10-CM | POA: Diagnosis not present

## 2014-02-19 ENCOUNTER — Ambulatory Visit: Payer: Medicare Other | Admitting: Adult Health

## 2014-02-26 ENCOUNTER — Ambulatory Visit: Payer: Medicare Other | Admitting: Adult Health

## 2014-03-06 ENCOUNTER — Ambulatory Visit
Admission: RE | Admit: 2014-03-06 | Discharge: 2014-03-06 | Disposition: A | Discharge status: Home / Self Care | Payer: PRIVATE HEALTH INSURANCE | Source: Ambulatory Visit | Attending: Oncology | Admitting: Oncology

## 2014-03-06 DIAGNOSIS — N6489 Other specified disorders of breast: Secondary | ICD-10-CM | POA: Diagnosis not present

## 2014-03-06 DIAGNOSIS — C50419 Malignant neoplasm of upper-outer quadrant of unspecified female breast: Secondary | ICD-10-CM

## 2014-03-11 ENCOUNTER — Other Ambulatory Visit: Payer: Self-pay

## 2014-03-11 ENCOUNTER — Telehealth: Payer: Self-pay | Admitting: Oncology

## 2014-03-11 ENCOUNTER — Other Ambulatory Visit: Payer: Self-pay | Admitting: Oncology

## 2014-03-11 DIAGNOSIS — C50412 Malignant neoplasm of upper-outer quadrant of left female breast: Secondary | ICD-10-CM

## 2014-03-11 NOTE — Telephone Encounter (Signed)
, °

## 2014-03-12 ENCOUNTER — Other Ambulatory Visit: Payer: Medicare Other

## 2014-03-12 ENCOUNTER — Telehealth: Payer: Self-pay | Admitting: Oncology

## 2014-03-12 ENCOUNTER — Other Ambulatory Visit (HOSPITAL_BASED_OUTPATIENT_CLINIC_OR_DEPARTMENT_OTHER): Payer: Medicare Other

## 2014-03-12 ENCOUNTER — Encounter: Payer: Self-pay | Admitting: Oncology

## 2014-03-12 ENCOUNTER — Ambulatory Visit (HOSPITAL_BASED_OUTPATIENT_CLINIC_OR_DEPARTMENT_OTHER): Payer: Medicare Other | Admitting: Oncology

## 2014-03-12 ENCOUNTER — Ambulatory Visit: Payer: Medicare Other | Admitting: Oncology

## 2014-03-12 VITALS — BP 128/45 | HR 71 | Temp 98.5°F | Resp 20

## 2014-03-12 DIAGNOSIS — Z170 Estrogen receptor positive status [ER+]: Secondary | ICD-10-CM

## 2014-03-12 DIAGNOSIS — C50412 Malignant neoplasm of upper-outer quadrant of left female breast: Secondary | ICD-10-CM | POA: Diagnosis not present

## 2014-03-12 DIAGNOSIS — E119 Type 2 diabetes mellitus without complications: Secondary | ICD-10-CM | POA: Diagnosis not present

## 2014-03-12 DIAGNOSIS — J449 Chronic obstructive pulmonary disease, unspecified: Secondary | ICD-10-CM | POA: Diagnosis not present

## 2014-03-12 LAB — CBC WITH DIFFERENTIAL/PLATELET
BASO%: 0.6 % (ref 0.0–2.0)
BASOS ABS: 0.1 10*3/uL (ref 0.0–0.1)
EOS ABS: 0.2 10*3/uL (ref 0.0–0.5)
EOS%: 3 % (ref 0.0–7.0)
HCT: 38.7 % (ref 34.8–46.6)
HGB: 12.2 g/dL (ref 11.6–15.9)
LYMPH%: 19.4 % (ref 14.0–49.7)
MCH: 29.2 pg (ref 25.1–34.0)
MCHC: 31.6 g/dL (ref 31.5–36.0)
MCV: 92.5 fL (ref 79.5–101.0)
MONO#: 0.7 10*3/uL (ref 0.1–0.9)
MONO%: 8.6 % (ref 0.0–14.0)
NEUT#: 5.5 10*3/uL (ref 1.5–6.5)
NEUT%: 68.4 % (ref 38.4–76.8)
Platelets: 186 10*3/uL (ref 145–400)
RBC: 4.18 10*6/uL (ref 3.70–5.45)
RDW: 13.3 % (ref 11.2–14.5)
WBC: 8 10*3/uL (ref 3.9–10.3)
lymph#: 1.6 10*3/uL (ref 0.9–3.3)

## 2014-03-12 LAB — COMPREHENSIVE METABOLIC PANEL (CC13)
ALBUMIN: 3.4 g/dL — AB (ref 3.5–5.0)
ALT: 12 U/L (ref 0–55)
AST: 12 U/L (ref 5–34)
Alkaline Phosphatase: 57 U/L (ref 40–150)
Anion Gap: 10 mEq/L (ref 3–11)
BUN: 38.8 mg/dL — AB (ref 7.0–26.0)
CALCIUM: 9.7 mg/dL (ref 8.4–10.4)
CHLORIDE: 97 meq/L — AB (ref 98–109)
CO2: 36 mEq/L — ABNORMAL HIGH (ref 22–29)
Creatinine: 1 mg/dL (ref 0.6–1.1)
Glucose: 194 mg/dl — ABNORMAL HIGH (ref 70–140)
Potassium: 4.7 mEq/L (ref 3.5–5.1)
Sodium: 144 mEq/L (ref 136–145)
Total Bilirubin: 0.31 mg/dL (ref 0.20–1.20)
Total Protein: 7.1 g/dL (ref 6.4–8.3)

## 2014-03-12 NOTE — Progress Notes (Signed)
OFFICE PROGRESS NOTE   03/12/2014   Physicians:K[place]om Army Fossa, [place].Pablo Ledger, R.Dareen Piano, R.Buccini   INTERVAL HISTORY:  Patient is seen, together with caregiver, in scheduled follow up of T1cNx left breast cancer, on observation since lumpectomy 03-2013. She had bilateral diagnostic mammograms at [facility] 03-06-14 without findings of concern. She is not aware of any changes in breasts bilaterally. She does not follow with general surgery or radiation oncology now.  Patient sees PCP Dr Inda Merlin ~ every 3 months. She saw Dr Simona Huh in 12-2013, no further vaginal bleeding since episode evaluated in 2014. She seems to have been at usual baseline since I met her in 10-2013, on home oxygen, minimally ambulatory, chronic LE swelling. She denies any recent infectious illness or any new or different pain.  She has had flu vaccine. She does not have central catheter.   ONCOLOGIC HISTORY Patient was found to have left breast abnormality on screening mammograms 02-26-2013, biopsy with infiltrating ductal carcinoma ER 100%, PR negative, HER2 negative. Ki67 of 51%. She had lumpectomy without axillary node evaluation by Dr Dalbert Batman on 04-05-2013, path with 1.6 cm grade 2 invasive ductal carcinoma, margins negative. She saw Dr Humphrey Rolls in consultation initially, with no decision reached then about adjuvant hormonal blockade. She saw Dr Pablo Ledger, with recommendation not to attempt radiation therapy.   Review of systems as above, also: Bowels move regularly. No other bleeding. No swelling UE. Remainder of 10 point Review of Systems negative.  Objective:  Vital signs in last 24 hours:  BP 128/45  Pulse 71  Temp(Src) 98.5 F (36.9 C) (Oral)  Resp 20 Did not stand for weight, Respirations not labored seated in WC on [place] O2. Alert, oriented and appropriate. Morbidly obese.  HEENT:PERRL, sclerae not icteric. Oral mucosa moist without lesions, posterior pharynx clear.  Neck  supple. No JVD.  Lymphatics:no cervical,suraclavicular, axillary adenopathy Resp: clear to auscultation bilaterally and normal percussion bilaterally, diminished BS in bases consistent with size. Cardio: regular rate and rhythm. No gallop. GI: abdomen obese, soft, nontender, not distended, cannot appreciate any mass or organomegaly. Normally active bowel sounds.  Musculoskeletal/ Extremities: LE 2+ swelling without cords, tenderness. No edema apparent in UE. Neuro: speech fluent and appropriate. Moves all extremities equally in [facility] Skin without rash, ecchymosis, petechiae Breasts: left lumpectomy well healed and otherwise bilaterally without dominant mass, skin or nipple findings. Axillae benign.   Lab Results:  Results for orders placed in visit on 03/12/14  CBC WITH DIFFERENTIAL      Result Value Ref Range   WBC 8.0  3.9 - 10.3 10e3/uL   NEUT# 5.5  1.5 - 6.5 10e3/uL   HGB 12.2  11.6 - 15.9 g/dL   HCT 38.7  34.8 - 46.6 %   Platelets 186  145 - 400 10e3/uL   MCV 92.5  79.5 - 101.0 fL   MCH 29.2  25.1 - 34.0 pg   MCHC 31.6  31.5 - 36.0 g/dL   RBC 4.18  3.70 - 5.45 10e6/uL   RDW 13.3  11.2 - 14.5 %   lymph# 1.6  0.9 - 3.3 10e3/uL   MONO# 0.7  0.1 - 0.9 10e3/uL   Eosinophils Absolute 0.2  0.0 - 0.5 10e3/uL   Basophils Absolute 0.1  0.0 - 0.1 10e3/uL   NEUT% 68.4  38.4 - 76.8 %   LYMPH% 19.4  14.0 - 49.7 %   MONO% 8.6  0.0 - 14.0 %   EOS% 3.0  0.0 - 7.0 %  BASO% 0.6  0.0 - 2.0 %  COMPREHENSIVE METABOLIC PANEL (ZX67)      Result Value Ref Range   Sodium 144  136 - 145 mEq/L   Potassium 4.7  3.5 - 5.1 mEq/L   Chloride 97 (*) 98 - 109 mEq/L   CO2 36 (*) 22 - 29 mEq/L   Glucose 194 (*) 70 - 140 mg/dl   BUN 38.8 (*) 7.0 - 26.0 mg/dL   Creatinine 1.0  0.6 - 1.1 mg/dL   Total Bilirubin 0.31  0.20 - 1.20 mg/dL   Alkaline Phosphatase 57  40 - 150 U/L   AST 12  5 - 34 U/L   ALT 12  0 - 55 U/L   Total Protein 7.1  6.4 - 8.3 g/dL   Albumin 3.4 (*) 3.5 - 5.0 g/dL   Calcium 9.7  8.4 -  10.4 mg/dL   Anion Gap 10  3 - 11 mEq/L     Studies/Results: DIGITAL DIAGNOSTIC bilateral MAMMOGRAM WITH CAD [facility]-06-14 [facility] COMPARISON: 02/26/2013, 02/06/2013, 10/31/2008.  ACR Breast Density Category b: There are scattered areas of  fibroglandular density.  FINDINGS:  There are mild postoperative scarring changes located within the  upper outer quadrant left breast related to the patient's  lumpectomy. There is no specific evidence for recurrent tumor or  developing malignancy within either breast area  Mammographic images were processed with CAD.  IMPRESSION:  No findings worrisome for recurrent tumor or developing malignancy.  RECOMMENDATION:  Bilateral diagnostic mammography 1 year.   Medications: I have reviewed the patient's current medications.  DISCUSSION: Patient requests continued follow up at this office. I will see her back in 6 months, then may extend interval as she sees other MDs so frequently  Assessment/Plan: 1.T1Nx grade 2 invasive ductal carcinoma of left breast: ER +, PR and HER2 negative, post lumpectomy 03-2013, not candidate for radiation or hormonal blocker.  return visit 6 mo as we follow with observation.  2.morbid obesity with BMI >48  3.COPD, long past tobacco, sleep apnea: on home O2 and CPAP  4.DM  5.postmenopausal bleeding 2014: followed by Dr Simona Huh, gyn exam done August 6.osteoarthritis and degenerative disc disease  7.cholecystectomy, cataract extractions, previous colonoscopy by Dr Cristina Gong  8.remote bone density scan  9.PS no better than ECOG 3    All questions answered and she is in agreement with plan    Gordy Levan, MD   03/12/2014, 3:33 PM

## 2014-03-12 NOTE — Telephone Encounter (Signed)
per pof to sch pt appt-LL sch not opened-will give pof to [facility] for sch later after sch open-pt understood

## 2014-03-14 ENCOUNTER — Telehealth: Payer: Self-pay

## 2014-03-14 NOTE — Telephone Encounter (Signed)
[name] called requesting two copies of her lab work from 03-12-14 to be mailed to her home.  Mailed  Labs to her home address listed in EMR. Told [name] that Dr. Mariana Kaufman appointment  Schedule for April 2016 is not open yet.  She should call the [facility] scheduling department  In Feb/2016 if she has not heard form the schedulers with appointment for April.  [name] verbalized understanding.

## 2014-03-19 DIAGNOSIS — H3531 Nonexudative age-related macular degeneration: Secondary | ICD-10-CM | POA: Diagnosis not present

## 2014-04-03 DIAGNOSIS — K219 Gastro-esophageal reflux disease without esophagitis: Secondary | ICD-10-CM | POA: Diagnosis not present

## 2014-04-03 DIAGNOSIS — H9313 Tinnitus, bilateral: Secondary | ICD-10-CM | POA: Diagnosis not present

## 2014-04-03 DIAGNOSIS — M2669 Other specified disorders of temporomandibular joint: Secondary | ICD-10-CM | POA: Diagnosis not present

## 2014-04-03 DIAGNOSIS — H9190 Unspecified hearing loss, unspecified ear: Secondary | ICD-10-CM | POA: Diagnosis not present

## 2014-04-03 DIAGNOSIS — E119 Type 2 diabetes mellitus without complications: Secondary | ICD-10-CM | POA: Diagnosis not present

## 2014-04-03 DIAGNOSIS — J45909 Unspecified asthma, uncomplicated: Secondary | ICD-10-CM | POA: Diagnosis not present

## 2014-04-03 DIAGNOSIS — G4730 Sleep apnea, unspecified: Secondary | ICD-10-CM | POA: Diagnosis not present

## 2014-04-09 DIAGNOSIS — E1165 Type 2 diabetes mellitus with hyperglycemia: Secondary | ICD-10-CM | POA: Diagnosis not present

## 2014-04-09 DIAGNOSIS — F419 Anxiety disorder, unspecified: Secondary | ICD-10-CM | POA: Diagnosis not present

## 2014-04-09 DIAGNOSIS — I10 Essential (primary) hypertension: Secondary | ICD-10-CM | POA: Diagnosis not present

## 2014-04-09 DIAGNOSIS — E1121 Type 2 diabetes mellitus with diabetic nephropathy: Secondary | ICD-10-CM | POA: Diagnosis not present

## 2014-04-12 ENCOUNTER — Encounter (HOSPITAL_COMMUNITY): Payer: Self-pay | Admitting: Emergency Medicine

## 2014-04-12 ENCOUNTER — Inpatient Hospital Stay (HOSPITAL_COMMUNITY)
Admission: EM | Admit: 2014-04-12 | Discharge: 2014-04-16 | DRG: 189 | Disposition: A | Discharge status: Home / Self Care | Payer: Medicare Other | Attending: Internal Medicine | Admitting: Internal Medicine

## 2014-04-12 ENCOUNTER — Emergency Department (HOSPITAL_COMMUNITY): Payer: Medicare Other

## 2014-04-12 DIAGNOSIS — J810 Acute pulmonary edema: Secondary | ICD-10-CM | POA: Diagnosis not present

## 2014-04-12 DIAGNOSIS — Z79899 Other long term (current) drug therapy: Secondary | ICD-10-CM | POA: Diagnosis not present

## 2014-04-12 DIAGNOSIS — R0602 Shortness of breath: Secondary | ICD-10-CM | POA: Diagnosis not present

## 2014-04-12 DIAGNOSIS — Z880 Allergy status to penicillin: Secondary | ICD-10-CM | POA: Diagnosis not present

## 2014-04-12 DIAGNOSIS — G92 Toxic encephalopathy: Secondary | ICD-10-CM | POA: Diagnosis present

## 2014-04-12 DIAGNOSIS — Z87891 Personal history of nicotine dependence: Secondary | ICD-10-CM | POA: Diagnosis not present

## 2014-04-12 DIAGNOSIS — K219 Gastro-esophageal reflux disease without esophagitis: Secondary | ICD-10-CM | POA: Diagnosis present

## 2014-04-12 DIAGNOSIS — R531 Weakness: Secondary | ICD-10-CM | POA: Diagnosis not present

## 2014-04-12 DIAGNOSIS — J9601 Acute respiratory failure with hypoxia: Secondary | ICD-10-CM | POA: Diagnosis not present

## 2014-04-12 DIAGNOSIS — J9622 Acute and chronic respiratory failure with hypercapnia: Secondary | ICD-10-CM | POA: Insufficient documentation

## 2014-04-12 DIAGNOSIS — J309 Allergic rhinitis, unspecified: Secondary | ICD-10-CM | POA: Diagnosis present

## 2014-04-12 DIAGNOSIS — J9600 Acute respiratory failure, unspecified whether with hypoxia or hypercapnia: Secondary | ICD-10-CM | POA: Diagnosis not present

## 2014-04-12 DIAGNOSIS — K044 Acute apical periodontitis of pulpal origin: Secondary | ICD-10-CM | POA: Diagnosis present

## 2014-04-12 DIAGNOSIS — E11649 Type 2 diabetes mellitus with hypoglycemia without coma: Secondary | ICD-10-CM | POA: Diagnosis present

## 2014-04-12 DIAGNOSIS — H3530 Unspecified macular degeneration: Secondary | ICD-10-CM | POA: Diagnosis present

## 2014-04-12 DIAGNOSIS — M1990 Unspecified osteoarthritis, unspecified site: Secondary | ICD-10-CM | POA: Diagnosis present

## 2014-04-12 DIAGNOSIS — Z9981 Dependence on supplemental oxygen: Secondary | ICD-10-CM

## 2014-04-12 DIAGNOSIS — G4733 Obstructive sleep apnea (adult) (pediatric): Secondary | ICD-10-CM | POA: Diagnosis not present

## 2014-04-12 DIAGNOSIS — J9811 Atelectasis: Secondary | ICD-10-CM | POA: Diagnosis not present

## 2014-04-12 DIAGNOSIS — J9621 Acute and chronic respiratory failure with hypoxia: Secondary | ICD-10-CM | POA: Diagnosis present

## 2014-04-12 DIAGNOSIS — R4182 Altered mental status, unspecified: Secondary | ICD-10-CM | POA: Diagnosis not present

## 2014-04-12 DIAGNOSIS — T424X5A Adverse effect of benzodiazepines, initial encounter: Secondary | ICD-10-CM | POA: Diagnosis present

## 2014-04-12 DIAGNOSIS — J90 Pleural effusion, not elsewhere classified: Secondary | ICD-10-CM | POA: Diagnosis not present

## 2014-04-12 DIAGNOSIS — Z7982 Long term (current) use of aspirin: Secondary | ICD-10-CM

## 2014-04-12 DIAGNOSIS — J449 Chronic obstructive pulmonary disease, unspecified: Secondary | ICD-10-CM | POA: Diagnosis present

## 2014-04-12 DIAGNOSIS — Z853 Personal history of malignant neoplasm of breast: Secondary | ICD-10-CM

## 2014-04-12 DIAGNOSIS — E662 Morbid (severe) obesity with alveolar hypoventilation: Secondary | ICD-10-CM | POA: Diagnosis present

## 2014-04-12 DIAGNOSIS — N183 Chronic kidney disease, stage 3 (moderate): Secondary | ICD-10-CM | POA: Diagnosis present

## 2014-04-12 DIAGNOSIS — R918 Other nonspecific abnormal finding of lung field: Secondary | ICD-10-CM | POA: Diagnosis not present

## 2014-04-12 DIAGNOSIS — Z6841 Body Mass Index (BMI) 40.0 and over, adult: Secondary | ICD-10-CM

## 2014-04-12 DIAGNOSIS — I129 Hypertensive chronic kidney disease with stage 1 through stage 4 chronic kidney disease, or unspecified chronic kidney disease: Secondary | ICD-10-CM | POA: Diagnosis present

## 2014-04-12 DIAGNOSIS — J9612 Chronic respiratory failure with hypercapnia: Secondary | ICD-10-CM | POA: Diagnosis not present

## 2014-04-12 DIAGNOSIS — R0600 Dyspnea, unspecified: Secondary | ICD-10-CM | POA: Diagnosis not present

## 2014-04-12 DIAGNOSIS — I5033 Acute on chronic diastolic (congestive) heart failure: Principal | ICD-10-CM | POA: Diagnosis present

## 2014-04-12 DIAGNOSIS — I509 Heart failure, unspecified: Secondary | ICD-10-CM | POA: Diagnosis not present

## 2014-04-12 DIAGNOSIS — F419 Anxiety disorder, unspecified: Secondary | ICD-10-CM | POA: Diagnosis present

## 2014-04-12 DIAGNOSIS — J9602 Acute respiratory failure with hypercapnia: Secondary | ICD-10-CM

## 2014-04-12 DIAGNOSIS — J811 Chronic pulmonary edema: Secondary | ICD-10-CM

## 2014-04-12 DIAGNOSIS — I517 Cardiomegaly: Secondary | ICD-10-CM | POA: Diagnosis not present

## 2014-04-12 DIAGNOSIS — J9692 Respiratory failure, unspecified with hypercapnia: Secondary | ICD-10-CM | POA: Diagnosis present

## 2014-04-12 DIAGNOSIS — R0902 Hypoxemia: Secondary | ICD-10-CM | POA: Diagnosis not present

## 2014-04-12 HISTORY — DX: Morbid (severe) obesity with alveolar hypoventilation: E66.2

## 2014-04-12 LAB — CBC WITH DIFFERENTIAL/PLATELET
BASOS ABS: 0 10*3/uL (ref 0.0–0.1)
Basophils Relative: 0 % (ref 0–1)
EOS ABS: 0 10*3/uL (ref 0.0–0.7)
Eosinophils Relative: 0 % (ref 0–5)
HCT: 43.5 % (ref 36.0–46.0)
Hemoglobin: 13 g/dL (ref 12.0–15.0)
LYMPHS ABS: 0.9 10*3/uL (ref 0.7–4.0)
Lymphocytes Relative: 10 % — ABNORMAL LOW (ref 12–46)
MCH: 29.7 pg (ref 26.0–34.0)
MCHC: 29.9 g/dL — ABNORMAL LOW (ref 30.0–36.0)
MCV: 99.5 fL (ref 78.0–100.0)
Monocytes Absolute: 0.6 10*3/uL (ref 0.1–1.0)
Monocytes Relative: 7 % (ref 3–12)
NEUTROS PCT: 83 % — AB (ref 43–77)
Neutro Abs: 7.1 10*3/uL (ref 1.7–7.7)
PLATELETS: 203 10*3/uL (ref 150–400)
RBC: 4.37 MIL/uL (ref 3.87–5.11)
RDW: 12.7 % (ref 11.5–15.5)
WBC: 8.7 10*3/uL (ref 4.0–10.5)

## 2014-04-12 LAB — URINE MICROSCOPIC-ADD ON

## 2014-04-12 LAB — URINALYSIS, ROUTINE W REFLEX MICROSCOPIC
Bilirubin Urine: NEGATIVE
GLUCOSE, UA: NEGATIVE mg/dL
KETONES UR: NEGATIVE mg/dL
Leukocytes, UA: NEGATIVE
Nitrite: NEGATIVE
Protein, ur: 100 mg/dL — AB
Specific Gravity, Urine: 1.022 (ref 1.005–1.030)
Urobilinogen, UA: 0.2 mg/dL (ref 0.0–1.0)
pH: 5.5 (ref 5.0–8.0)

## 2014-04-12 LAB — BLOOD GAS, ARTERIAL
Acid-Base Excess: 4.1 mmol/L — ABNORMAL HIGH (ref 0.0–2.0)
Acid-Base Excess: 7.2 mmol/L — ABNORMAL HIGH (ref 0.0–2.0)
BICARBONATE: 35 meq/L — AB (ref 20.0–24.0)
Bicarbonate: 38.2 mEq/L — ABNORMAL HIGH (ref 20.0–24.0)
DRAWN BY: 362771
Drawn by: 331471
FIO2: 0.45 %
LHR: 15 {breaths}/min
O2 CONTENT: 2 L/min
O2 Saturation: 90.8 %
O2 Saturation: 95 %
PATIENT TEMPERATURE: 98.6
PCO2 ART: 92.7 mmHg — AB (ref 35.0–45.0)
PEEP/CPAP: 5 cmH2O
PH ART: 7.201 — AB (ref 7.350–7.450)
PO2 ART: 63.8 mmHg — AB (ref 80.0–100.0)
Patient temperature: 98.5
TCO2: 33 mmol/L (ref 0–100)
TCO2: 36.3 mmol/L (ref 0–100)
pCO2 arterial: 99.9 mmHg (ref 35.0–45.0)
pH, Arterial: 7.207 — ABNORMAL LOW (ref 7.350–7.450)
pO2, Arterial: 96.9 mmHg (ref 80.0–100.0)

## 2014-04-12 LAB — RAPID URINE DRUG SCREEN, HOSP PERFORMED
Amphetamines: NOT DETECTED
BARBITURATES: NOT DETECTED
Benzodiazepines: POSITIVE — AB
COCAINE: NOT DETECTED
Opiates: NOT DETECTED
TETRAHYDROCANNABINOL: NOT DETECTED

## 2014-04-12 LAB — COMPREHENSIVE METABOLIC PANEL
ALBUMIN: 3.5 g/dL (ref 3.5–5.2)
ALK PHOS: 61 U/L (ref 39–117)
ALT: 13 U/L (ref 0–35)
AST: 16 U/L (ref 0–37)
Anion gap: 9 (ref 5–15)
BUN: 28 mg/dL — ABNORMAL HIGH (ref 6–23)
CO2: 37 mEq/L — ABNORMAL HIGH (ref 19–32)
Calcium: 9.4 mg/dL (ref 8.4–10.5)
Chloride: 97 mEq/L (ref 96–112)
Creatinine, Ser: 0.82 mg/dL (ref 0.50–1.10)
GFR calc non Af Amer: 66 mL/min — ABNORMAL LOW (ref >90)
GFR, EST AFRICAN AMERICAN: 76 mL/min — AB (ref >90)
GLUCOSE: 232 mg/dL — AB (ref 70–99)
POTASSIUM: 5.2 meq/L (ref 3.7–5.3)
SODIUM: 143 meq/L (ref 137–147)
TOTAL PROTEIN: 8.3 g/dL (ref 6.0–8.3)
Total Bilirubin: 0.3 mg/dL (ref 0.3–1.2)

## 2014-04-12 LAB — TROPONIN I: Troponin I: <0.3 ng/mL (ref <0.30)

## 2014-04-12 LAB — AMMONIA: Ammonia: 28 umol/L (ref 11–60)

## 2014-04-12 LAB — CBG MONITORING, ED: GLUCOSE-CAPILLARY: 222 mg/dL — AB (ref 70–99)

## 2014-04-12 LAB — ETHANOL: Alcohol, Ethyl (B): <11 mg/dL (ref 0–11)

## 2014-04-12 MED ORDER — DOCUSATE SODIUM 100 MG PO CAPS
100.0000 mg | ORAL_CAPSULE | Freq: Every day | ORAL | Status: DC | PRN
Start: 2014-04-12 — End: 2014-04-16
  Filled 2014-04-12: qty 2

## 2014-04-12 MED ORDER — ALBUTEROL SULFATE (2.5 MG/3ML) 0.083% IN NEBU: 2.5000 mg | INHALATION_SOLUTION | RESPIRATORY_TRACT | Status: DC | PRN

## 2014-04-12 MED ORDER — HYPROMELLOSE (GONIOSCOPIC) 2.5 % OP SOLN: 1.0000 [drp] | Freq: Three times a day (TID) | OPHTHALMIC | Status: DC | PRN

## 2014-04-12 MED ORDER — DEXTROSE 5 % IV SOLN
500.0000 mg | Freq: Once | INTRAVENOUS | Status: AC
  Administered 2014-04-12: 500 mg via INTRAVENOUS
  Filled 2014-04-12: qty 500

## 2014-04-12 MED ORDER — FLUTICASONE PROPIONATE 50 MCG/ACT NA SUSP
2.0000 | Freq: Every day | NASAL | Status: DC
  Administered 2014-04-14 – 2014-04-16 (×3): 2 via NASAL
  Filled 2014-04-12: qty 16

## 2014-04-12 MED ORDER — INSULIN ASPART 100 UNIT/ML ~~LOC~~ SOLN
0.0000 [IU] | SUBCUTANEOUS | Status: DC
  Administered 2014-04-12: 7 [IU] via SUBCUTANEOUS
  Administered 2014-04-14: 3 [IU] via SUBCUTANEOUS
  Administered 2014-04-14 (×2): 4 [IU] via SUBCUTANEOUS
  Administered 2014-04-14 (×2): 7 [IU] via SUBCUTANEOUS
  Administered 2014-04-15: 3 [IU] via SUBCUTANEOUS
  Filled 2014-04-12 (×2): qty 1

## 2014-04-12 MED ORDER — IPRATROPIUM-ALBUTEROL 0.5-2.5 (3) MG/3ML IN SOLN
3.0000 mL | RESPIRATORY_TRACT | Status: DC
  Administered 2014-04-12 – 2014-04-14 (×10): 3 mL via RESPIRATORY_TRACT
  Filled 2014-04-12 (×10): qty 3

## 2014-04-12 MED ORDER — HEPARIN SODIUM (PORCINE) 5000 UNIT/ML IJ SOLN
5000.0000 [IU] | Freq: Three times a day (TID) | INTRAMUSCULAR | Status: DC
  Administered 2014-04-13 – 2014-04-16 (×9): 5000 [IU] via SUBCUTANEOUS
  Filled 2014-04-12 (×17): qty 1

## 2014-04-12 MED ORDER — POLYVINYL ALCOHOL 1.4 % OP SOLN
1.0000 [drp] | Freq: Three times a day (TID) | OPHTHALMIC | Status: DC | PRN
  Administered 2014-04-13: 1 [drp] via OPHTHALMIC
  Filled 2014-04-12 (×2): qty 15

## 2014-04-12 MED ORDER — FUROSEMIDE 10 MG/ML IJ SOLN
80.0000 mg | Freq: Four times a day (QID) | INTRAMUSCULAR | Status: DC
  Administered 2014-04-12 – 2014-04-13 (×3): 80 mg via INTRAVENOUS
  Filled 2014-04-12 (×3): qty 8

## 2014-04-12 MED ORDER — SODIUM CHLORIDE 0.9 % IV SOLN: 250.0000 mL | INTRAVENOUS | Status: DC | PRN

## 2014-04-12 MED ORDER — DEXTROSE 5 % IV SOLN
1.0000 g | Freq: Once | INTRAVENOUS | Status: AC
  Administered 2014-04-12: 1 g via INTRAVENOUS
  Filled 2014-04-12: qty 10

## 2014-04-12 NOTE — ED Notes (Signed)
Bed: WA16 Expected date:  Expected time:  Means of arrival:  Comments: 

## 2014-04-12 NOTE — ED Notes (Signed)
Pt is resting quietly,  Sitter at bedside   Pt will be held in Ed all night due to lack of beds

## 2014-04-12 NOTE — ED Provider Notes (Addendum)
CSN: [id number]     Arrival date & time 04/12/14  1629 History   First MD Initiated Contact with Patient 04/12/14 1637     Chief Complaint  Patient presents with  . Fatigue     Level V caveat: Altered mental status  HPI Patient presents to the emergency department from home for increasing lethargy and confusion over the past 2 days.  Family does report some increase in her Xanax recently to help her with anxiety and mood.  Family reports decreased oral intake.  Patient with a history of diastolic congestive heart failure.  Felt reports productive cough without fever.  No reports of vomiting or diarrhea.  No recent new medication changes besides her Xanax and her Zoloft.  Patient is able to answer simple questions and follows simple commands during the history.  She falls asleep during the history.   Past Medical History  Diagnosis Date  . Diabetes mellitus without complication   . Hypertension   . Anxiety   . Arthritis   . Obesity   . CKD (chronic kidney disease), stage III   . COPD (chronic obstructive pulmonary disease)     uses O2 at home  . Shortness of breath     with annxiety attack  . CHF (congestive heart failure)     12/13 admission  . GERD (gastroesophageal reflux disease)   . Seasonal allergies   . Complication of anesthesia     O2 sat drop during  and went into coma  . Sleep apnea     CPAP   Past Surgical History  Procedure Laterality Date  . Tonsillectomy    . Cholecystectomy    .  fybroid tumors    . Breast surgery    . Eye surgery      cataracts  . Hernia repair      umbilical  . Pilonidal cyst excision      buttock  . Partial mastectomy with needle localization Left 04/05/2013    Procedure: PARTIAL MASTECTOMY WITH NEEDLE LOCALIZATION;  Surgeon: Adin Hector, MD;  Location: [facility];  Service: General;  Laterality: Left;   Family History  Problem Relation Age of Onset  . Heart failure Mother   . Gallbladder disease Father   . Pancreatic cancer  Sister    History  Substance Use Topics  . Smoking status: Former Smoker -- 0.50 packs/day for 40 years    Types: Cigarettes    Quit date: 05/30/1980  . Smokeless tobacco: Never Used  . Alcohol Use: No   OB History    No data available     Review of Systems  Unable to perform ROS     Allergies  Demerol; Penicillins; Phenergan; Codeine; Macrobid; and Relafen  Home Medications   Prior to Admission medications   Medication Sig Start Date End Date Taking? Authorizing Provider  acetaminophen (TYLENOL) 500 MG tablet Take 500 mg by mouth every 6 (six) hours as needed.   Yes Historical Provider, MD  albuterol (PROVENTIL HFA;VENTOLIN HFA) 108 (90 BASE) MCG/ACT inhaler Inhale 2 puffs into the lungs every 4 (four) hours as needed for wheezing or shortness of breath. 10/03/13  Yes Domenic Polite, MD  ALPRAZolam Duanne Moron) 0.5 MG tablet Take 0.25 mg by mouth 4 (four) times daily as needed for sleep. 5am, noon, 6pm, 12am if needed   Yes Historical Provider, MD  aspirin EC 81 MG tablet Take 81 mg by mouth every morning.   Yes Historical Provider, MD  cholecalciferol (VITAMIN D) 1000  UNITS tablet Take 1,000 Units by mouth every morning.   Yes Historical Provider, MD  Chromium-Cinnamon [phone] MCG-MG CAPS Take 1 tablet by mouth at bedtime.    Yes Historical Provider, MD  Dextromethorphan-Guaifenesin (TUSSIN DM PO) Take 5 mLs by mouth as needed (for cough).    Yes Historical Provider, MD  diclofenac sodium (VOLTAREN) 1 % GEL Apply 4 g topically 2 (two) times daily as needed (for joint pain). For joint pain   Yes Historical Provider, MD  Dietary Management Product (TOZAL PO) Take 3 tablets by mouth daily. For eyes   Yes Historical Provider, MD  docusate sodium (COLACE) 100 MG capsule Take 100-200 mg by mouth at bedtime as needed for constipation.    Yes Historical Provider, MD  fexofenadine (ALLEGRA) 180 MG tablet Take 180 mg by mouth at bedtime.   Yes Historical Provider, MD  fluticasone (FLONASE) 50  MCG/ACT nasal spray Place 2 sprays into the nose 2 (two) times daily.    Yes Historical Provider, MD  glipiZIDE (GLUCOTROL XL) 10 MG 24 hr tablet Take 10 mg by mouth every morning.   Yes Historical Provider, MD  hydroxypropyl methylcellulose (ISOPTO TEARS) 2.5 % ophthalmic solution Place 1 drop into both eyes 3 (three) times daily as needed (for dry eyes).   Yes Historical Provider, MD  lisinopril (PRINIVIL,ZESTRIL) 5 MG tablet Take 5 mg by mouth daily.   Yes Historical Provider, MD  nebivolol (BYSTOLIC) 5 MG tablet Take 5 mg by mouth daily. 05/20/12  Yes Barton Dubois, MD  ONGLYZA 5 MG TABS tablet 5 mg daily.  10/11/13  Yes Historical Provider, MD  pantoprazole (PROTONIX) 40 MG tablet Take 40 mg by mouth every morning.   Yes Historical Provider, MD  polyethylene glycol (MIRALAX / GLYCOLAX) packet Take 17 g by mouth daily as needed (for constipation).   Yes Historical Provider, MD  sertraline (ZOLOFT) 50 MG tablet Take 25-50 mg by mouth daily. For the first 6 days take half tab. Then a full tab as of 04-17-14   Yes Historical Provider, MD  torsemide (DEMADEX) 20 MG tablet Take 20 mg by mouth daily.   Yes Historical Provider, MD  traMADol (ULTRAM) 50 MG tablet Take 50 mg by mouth 3 (three) times daily as needed (for pain.).  05/20/12  Yes Barton Dubois, MD   BP 121/52 mmHg  Pulse 73  Temp(Src) 98.9 F (37.2 C) (Rectal)  Resp 20  SpO2 93% Physical Exam  Constitutional: She appears well-developed and well-nourished. No distress.  Morbid obesity  HENT:  Head: Normocephalic and atraumatic.  Eyes: EOM are normal.  Neck: Normal range of motion.  Cardiovascular: Normal rate, regular rhythm and normal heart sounds.   Pulmonary/Chest: Effort normal and breath sounds normal.  Abdominal: Soft. She exhibits no distension. There is no tenderness.  Musculoskeletal: Normal range of motion.  Neurological:  Follow simple commands.  Moves all 4 extremities equally.  Responds in short phrases  Skin: Skin  is warm and dry.  Psychiatric: She has a normal mood and affect. Judgment normal.  Nursing note and vitals reviewed.   ED Course  Procedures (including critical care time)  CRITICAL CARE Performed by: Hoy Morn Total critical care time: 40 Critical care time was exclusive of separately billable procedures and treating other patients. Critical care was necessary to treat or prevent imminent or life-threatening deterioration. Critical care was time spent personally by me on the following activities: development of treatment plan with patient and/or surrogate as well as nursing, discussions with  consultants, evaluation of patient's response to treatment, examination of patient, obtaining history from patient or surrogate, ordering and performing treatments and interventions, ordering and review of laboratory studies, ordering and review of radiographic studies, pulse oximetry and re-evaluation of patient's condition.   Labs Review Labs Reviewed  CBC WITH DIFFERENTIAL - Abnormal; Notable for the following:    MCHC 29.9 (*)    Neutrophils Relative % 83 (*)    Lymphocytes Relative 10 (*)    All other components within normal limits  COMPREHENSIVE METABOLIC PANEL - Abnormal; Notable for the following:    CO2 37 (*)    Glucose, Bld 232 (*)    BUN 28 (*)    GFR calc non Af Amer 66 (*)    GFR calc Af Amer 76 (*)    All other components within normal limits  BLOOD GAS, ARTERIAL - Abnormal; Notable for the following:    pH, Arterial 7.201 (*)    pCO2 arterial 92.7 (*)    pO2, Arterial 63.8 (*)    Bicarbonate 35.0 (*)    Acid-Base Excess 4.1 (*)    All other components within normal limits  URINE RAPID DRUG SCREEN (HOSP PERFORMED) - Abnormal; Notable for the following:    Benzodiazepines POSITIVE (*)    All other components within normal limits  URINALYSIS, ROUTINE W REFLEX MICROSCOPIC - Abnormal; Notable for the following:    Hgb urine dipstick SMALL (*)    Protein, ur 100 (*)     All other components within normal limits  URINE MICROSCOPIC-ADD ON - Abnormal; Notable for the following:    Casts HYALINE CASTS (*)    All other components within normal limits  CULTURE, BLOOD (ROUTINE X 2)  CULTURE, BLOOD (ROUTINE X 2)  TROPONIN I  AMMONIA  ETHANOL    Imaging Review Dg Chest Portable 1 View  04/12/2014   CLINICAL DATA:  Shortness of breath.  ICD10: R 6.02.  EXAM: PORTABLE CHEST - 1 VIEW  COMPARISON:  09/28/2013  FINDINGS: Mildly degraded exam due to AP portable technique and patient body habitus. Suboptimal patient positioning with patient rotation to the right. Moderate cardiomegaly. Cannot exclude small bilateral pleural effusions. No pneumothorax. Diffuse interstitial and airspace disease. Slightly lower lobe predominant and greater on the right.  IMPRESSION: Cardiomegaly with diffuse interstitial and airspace opacities. At least partially felt to be due to congestive heart failure. Concurrent bibasilar infection or aspiration cannot be excluded. Consider short term radiographic follow up.  Possible small bilateral pleural effusions.   Electronically Signed   By: Abigail Miyamoto M.D.   On: 04/12/2014 19:46  I personally reviewed the imaging tests through PACS system I reviewed available ER/hospitalization records through the EMR    EKG Interpretation None      MDM   Final diagnoses:  SOB (shortness of breath)  Acute respiratory failure with hypoxia and hypercarbia    Patient with hypercarbic respiratory failure.  Altered mental status likely secondary to CO2 of 92.seems to be correcting her airway at this time.  I do not think she needs to be intubated.  If she worsens she may require intubation.  She will need to be placed in ICU on BiPAP overnight.  Repeat ABG now.  Question for pneumonia.  Antibiotics now.  Blood cultures obtained.  Urinalysis and urine cultures.  White count is normal.  Vitals are otherwise normal.  Will continue to monitor the patient closely  while in the emergency department.  Admit to ICU. Spoke with PCCM  Hoy Morn, MD 04/12/14 2013  Hoy Morn, MD 04/12/14 [phone]

## 2014-04-12 NOTE — ED Notes (Signed)
Resp at bedside

## 2014-04-12 NOTE — ED Notes (Signed)
Pt from home via [facility]-Per EMS pt family called and reports that pt is more lethargic today than normal with difficulty arousing. Pt was seen at this facility for the same and was determined that pt may have taken too much xanax. Pt was given 1 xanax this am, but pt has hx of taking more meds than normal per family. Pt is A&O once aroused. Pt in NAD. Pt on 2L at home at all times. Pt on 4 L at this time. Pt denies pain or other c/o.

## 2014-04-12 NOTE — H&P (Signed)
PULMONARY / CRITICAL CARE MEDICINE   Name: Makayla Rodriguez MRN: [id number] DOB: [birth year]    ADMISSION DATE:  04/12/2014 CONSULTATION DATE:  04/12/2014   REFERRING MD :  EDP  CHIEF COMPLAINT:  Hypercarbic respiratory failure  INITIAL PRESENTATION: Increased somnolence and hypoxia.  STUDIES:  CXR 04/12/14 --> Increased pulmonary interstitial markings, possible small bilateral pleural effusions.   SIGNIFICANT EVENTS: BiPap initiated 04-12-14   HISTORY OF PRESENT ILLNESS:  80 y/o woman with past medical history significant for diastolic CHF, Obesity hypoventilation syndrome, diabetes melitis, possible tooth abscess and anxiety disorder.  She has had worsening anxiety over the last few weeks and this week was started on zoloft and xanex by her PCP.  Her caregiver reports that she has not been her normal self since at least 5am this morning.  She has been more somnolent and had trouble staying awake.  Her sister reports that she was also recently treated for an UTI.  Her caregiver also reports that she has not been wearing her CPAP through the entire night the last few nights as she has felt like it is smothering her.   PAST MEDICAL HISTORY :   has a past medical history of Diabetes mellitus without complication; Hypertension; Anxiety; Arthritis; Obesity; CKD (chronic kidney disease), stage III; COPD (chronic obstructive pulmonary disease); Shortness of breath; CHF (congestive heart failure); GERD (gastroesophageal reflux disease); Seasonal allergies; Complication of anesthesia; and Sleep apnea.  has past surgical history that includes Tonsillectomy; Cholecystectomy;  fybroid tumors; Breast surgery; Eye surgery; Hernia repair; Pilonidal cyst excision; and Partial mastectomy with needle localization (Left, 04/05/2013). Prior to Admission medications   Medication Sig Start Date End Date Taking? Authorizing Provider  acetaminophen (TYLENOL) 500 MG tablet Take 500 mg by mouth every 6 (six) hours as  needed.   Yes Historical Provider, MD  albuterol (PROVENTIL HFA;VENTOLIN HFA) 108 (90 BASE) MCG/ACT inhaler Inhale 2 puffs into the lungs every 4 (four) hours as needed for wheezing or shortness of breath. 10/03/13  Yes Domenic Polite, MD  ALPRAZolam Duanne Moron) 0.5 MG tablet Take 0.25 mg by mouth 4 (four) times daily as needed for sleep. 5am, noon, 6pm, 12am if needed   Yes Historical Provider, MD  aspirin EC 81 MG tablet Take 81 mg by mouth every morning.   Yes Historical Provider, MD  cholecalciferol (VITAMIN D) 1000 UNITS tablet Take 1,000 Units by mouth every morning.   Yes Historical Provider, MD  Chromium-Cinnamon [phone] MCG-MG CAPS Take 1 tablet by mouth at bedtime.    Yes Historical Provider, MD  Dextromethorphan-Guaifenesin (TUSSIN DM PO) Take 5 mLs by mouth as needed (for cough).    Yes Historical Provider, MD  diclofenac sodium (VOLTAREN) 1 % GEL Apply 4 g topically 2 (two) times daily as needed (for joint pain). For joint pain   Yes Historical Provider, MD  Dietary Management Product (TOZAL PO) Take 3 tablets by mouth daily. For eyes   Yes Historical Provider, MD  docusate sodium (COLACE) 100 MG capsule Take 100-200 mg by mouth at bedtime as needed for constipation.    Yes Historical Provider, MD  fexofenadine (ALLEGRA) 180 MG tablet Take 180 mg by mouth at bedtime.   Yes Historical Provider, MD  fluticasone (FLONASE) 50 MCG/ACT nasal spray Place 2 sprays into the nose 2 (two) times daily.    Yes Historical Provider, MD  glipiZIDE (GLUCOTROL XL) 10 MG 24 hr tablet Take 10 mg by mouth every morning.   Yes Historical Provider, MD  hydroxypropyl methylcellulose (ISOPTO TEARS)  2.5 % ophthalmic solution Place 1 drop into both eyes 3 (three) times daily as needed (for dry eyes).   Yes Historical Provider, MD  lisinopril (PRINIVIL,ZESTRIL) 5 MG tablet Take 5 mg by mouth daily.   Yes Historical Provider, MD  nebivolol (BYSTOLIC) 5 MG tablet Take 5 mg by mouth daily. 05/20/12  Yes Barton Dubois, MD   ONGLYZA 5 MG TABS tablet 5 mg daily.  10/11/13  Yes Historical Provider, MD  pantoprazole (PROTONIX) 40 MG tablet Take 40 mg by mouth every morning.   Yes Historical Provider, MD  polyethylene glycol (MIRALAX / GLYCOLAX) packet Take 17 g by mouth daily as needed (for constipation).   Yes Historical Provider, MD  sertraline (ZOLOFT) 50 MG tablet Take 25-50 mg by mouth daily. For the first 6 days take half tab. Then a full tab as of 04-17-14   Yes Historical Provider, MD  torsemide (DEMADEX) 20 MG tablet Take 20 mg by mouth daily.   Yes Historical Provider, MD  traMADol (ULTRAM) 50 MG tablet Take 50 mg by mouth 3 (three) times daily as needed (for pain.).  05/20/12  Yes Barton Dubois, MD   Allergies  Allergen Reactions  . Demerol [Meperidine] Anaphylaxis    Reaction: Hypoventilation after surgery  . Penicillins Anaphylaxis  . Phenergan [Promethazine Hcl] Anaphylaxis    Reaction: hypoventilation  . Codeine Nausea And Vomiting  . Macrobid [Nitrofurantoin] Nausea And Vomiting  . Relafen [Nabumetone] Itching and Swelling    FAMILY HISTORY:  indicated that her mother is deceased. She indicated that her father is deceased.  SOCIAL HISTORY:  reports that she quit smoking about 33 years ago. Her smoking use included Cigarettes. She has a 20 pack-year smoking history. She has never used smokeless tobacco. She reports that she does not drink alcohol or use illicit drugs.  REVIEW OF SYSTEMS:  A full review of systems could not be obtained as the patient is somnolent and not able to answer questions.   SUBJECTIVE:   VITAL SIGNS: Temp:  [98.7 F (37.1 C)-98.9 F (37.2 C)] 98.9 F (37.2 C) (11/14 1739) Pulse Rate:  [73-80] 73 (11/14 1851) Resp:  [20] 20 (11/14 1851) BP: (121-146)/(51-52) 121/52 mmHg (11/14 1851) SpO2:  [93 %-100 %] 93 % (11/14 1851) FiO2 (%):  [35 %] 35 % (11/14 1910) HEMODYNAMICS:   VENTILATOR SETTINGS: Vent Mode:  [-] BIPAP FiO2 (%):  [35 %] 35 % Set Rate:  [15 bmp] 15  bmp PEEP:  [5 cmH20] 5 cmH20 INTAKE / OUTPUT: No intake or output data in the 24 hours ending 04/12/14 2024  PHYSICAL EXAMINATION: General:  Laying on stretcher, BiPap in place. Neuro:  Moves all extremities, awakes to voice and touch but quickly falls back to sleep. HEENT:  PERRL, EOMI, BiPap mask in place Cardiovascular:  NRRR, no MRG Lungs:  Decreased air movement throughout.  Bibasilar crackles.  Abdomen:  Soft, NTND, obese Musculoskeletal:  No joint deformities Skin:  No rashes or other lesions.   LABS:  CBC  Recent Labs Lab 04/12/14 1730  WBC 8.7  HGB 13.0  HCT 43.5  PLT 203   Coag's No results for input(s): APTT, INR in the last 168 hours. BMET  Recent Labs Lab 04/12/14 1730  NA 143  K 5.2  CL 97  CO2 37*  BUN 28*  CREATININE 0.82  GLUCOSE 232*   Electrolytes  Recent Labs Lab 04/12/14 1730  CALCIUM 9.4   Sepsis Markers No results for input(s): LATICACIDVEN, PROCALCITON, O2SATVEN in the last  168 hours. ABG  Recent Labs Lab 04/12/14 1750  PHART 7.201*  PCO2ART 92.7*  PO2ART 63.8*   Liver Enzymes  Recent Labs Lab 04/12/14 1730  AST 16  ALT 13  ALKPHOS 61  BILITOT 0.3  ALBUMIN 3.5   Cardiac Enzymes  Recent Labs Lab 04/12/14 1730  TROPONINI <0.30   Glucose No results for input(s): GLUCAP in the last 168 hours.  Imaging No results found.   ASSESSMENT / PLAN:  PULMONARY A:Hypercarbic respiratory failure 2/2 OHS and benzodiazepines P:   Continue BiPap, adjust settings as needed. Repeat ABG pending. Albuterol/Ippratroprium q4, albuterol q2PRN Maintain sats between 88 and 94% Does wear 2-3L O2 at home.  CARDIOVASCULAR A: H/o diastolic CHF with pulmonary edema on CXR P:  Lasix q6x 4 doses. Strict ins and outs Place foley catheter to monitor urine output.   RENAL A:  H/o CKD P:   Monitor Cr and urine output  GASTROINTESTINAL A:  At risk for stress ulcer P:   Continue protonix   INFECTIOUS A:  Tooth  abscess? P:   BCx2 04/12/14 UC 04/12/14 Abx: Ceftriaxone, Azithro, start date11/14/15  ENDOCRINE A:  DM2   P:   SSI q4, holding home oral agents while mental status altered   NEUROLOGIC A:  AMS P:   RASS goal: 0 2/2 benzos and hypercarbia No benzos, no narcotics   FAMILY  - Updates: spoke with sister by phone at time of admission.  Updated to plan and pt status.   - Inter-disciplinary family meet or Palliative Care meeting due by:  04/19/14    TODAY'S SUMMARY: Admitted from ED for hypercarbic resp failure 2/2 xanex and OHS.      Pulmonary and [place] Pager: [phone]  04/12/2014, 8:24 PM

## 2014-04-12 NOTE — ED Notes (Signed)
Pt family asked how much longer it will be before EDP comes to talk to them. Family advised that after Dr Venora Maples has all results and reviews, he will talk to them. Dr Venora Maples notified of family request

## 2014-04-13 ENCOUNTER — Inpatient Hospital Stay (HOSPITAL_COMMUNITY): Payer: Medicare Other

## 2014-04-13 DIAGNOSIS — I517 Cardiomegaly: Secondary | ICD-10-CM

## 2014-04-13 DIAGNOSIS — R0600 Dyspnea, unspecified: Secondary | ICD-10-CM

## 2014-04-13 DIAGNOSIS — J811 Chronic pulmonary edema: Secondary | ICD-10-CM | POA: Insufficient documentation

## 2014-04-13 DIAGNOSIS — G4733 Obstructive sleep apnea (adult) (pediatric): Secondary | ICD-10-CM

## 2014-04-13 DIAGNOSIS — J810 Acute pulmonary edema: Secondary | ICD-10-CM

## 2014-04-13 DIAGNOSIS — J9601 Acute respiratory failure with hypoxia: Secondary | ICD-10-CM

## 2014-04-13 DIAGNOSIS — J9622 Acute and chronic respiratory failure with hypercapnia: Secondary | ICD-10-CM | POA: Insufficient documentation

## 2014-04-13 DIAGNOSIS — J9602 Acute respiratory failure with hypercapnia: Secondary | ICD-10-CM

## 2014-04-13 LAB — BLOOD GAS, ARTERIAL
Acid-Base Excess: 9.7 mmol/L — ABNORMAL HIGH (ref 0.0–2.0)
Bicarbonate: 36.9 mEq/L — ABNORMAL HIGH (ref 20.0–24.0)
FIO2: 0.35 %
O2 Saturation: 97.1 %
PEEP/CPAP: 5 cmH2O
Patient temperature: 37
Pressure control: 20 cmH2O
RATE: 20 resp/min
SAMPLE TYPE: 308601
TCO2: 34.2 mmol/L (ref 0–100)
pCO2 arterial: 67.4 mmHg (ref 35.0–45.0)
pH, Arterial: 7.358 (ref 7.350–7.450)
pO2, Arterial: 92.7 mmHg (ref 80.0–100.0)

## 2014-04-13 LAB — GLUCOSE, CAPILLARY
Glucose-Capillary: 69 mg/dL — ABNORMAL LOW (ref 70–99)
Glucose-Capillary: 115 mg/dL — ABNORMAL HIGH (ref 70–99)
Glucose-Capillary: 64 mg/dL — ABNORMAL LOW (ref 70–99)
Glucose-Capillary: 109 mg/dL — ABNORMAL HIGH (ref 70–99)
Glucose-Capillary: 105 mg/dL — ABNORMAL HIGH (ref 70–99)
Glucose-Capillary: 92 mg/dL (ref 70–99)
Glucose-Capillary: 79 mg/dL (ref 70–99)

## 2014-04-13 LAB — BASIC METABOLIC PANEL
Anion gap: 9 (ref 5–15)
BUN: 30 mg/dL — ABNORMAL HIGH (ref 6–23)
CALCIUM: 9.3 mg/dL (ref 8.4–10.5)
CO2: 37 mEq/L — ABNORMAL HIGH (ref 19–32)
Chloride: 94 mEq/L — ABNORMAL LOW (ref 96–112)
Creatinine, Ser: 0.86 mg/dL (ref 0.50–1.10)
GFR calc Af Amer: 72 mL/min — ABNORMAL LOW (ref >90)
GFR calc non Af Amer: 62 mL/min — ABNORMAL LOW (ref >90)
GLUCOSE: 85 mg/dL (ref 70–99)
Potassium: 4.3 mEq/L (ref 3.7–5.3)
SODIUM: 140 meq/L (ref 137–147)

## 2014-04-13 LAB — CBG MONITORING, ED: Glucose-Capillary: 126 mg/dL — ABNORMAL HIGH (ref 70–99)

## 2014-04-13 LAB — CBC
HCT: 38 % (ref 36.0–46.0)
HEMOGLOBIN: 11.4 g/dL — AB (ref 12.0–15.0)
MCH: 29.6 pg (ref 26.0–34.0)
MCHC: 30 g/dL (ref 30.0–36.0)
MCV: 98.7 fL (ref 78.0–100.0)
Platelets: 182 10*3/uL (ref 150–400)
RBC: 3.85 MIL/uL — ABNORMAL LOW (ref 3.87–5.11)
RDW: 12.9 % (ref 11.5–15.5)
WBC: 9.1 10*3/uL (ref 4.0–10.5)

## 2014-04-13 LAB — MRSA PCR SCREENING: MRSA by PCR: NEGATIVE

## 2014-04-13 MED ORDER — CHLORHEXIDINE GLUCONATE 0.12 % MT SOLN
15.0000 mL | Freq: Two times a day (BID) | OROMUCOSAL | Status: DC
  Administered 2014-04-13 – 2014-04-15 (×6): 15 mL via OROMUCOSAL
  Filled 2014-04-13 (×7): qty 15

## 2014-04-13 MED ORDER — FUROSEMIDE 10 MG/ML IJ SOLN
80.0000 mg | Freq: Three times a day (TID) | INTRAMUSCULAR | Status: DC
  Administered 2014-04-13 – 2014-04-15 (×5): 80 mg via INTRAVENOUS
  Filled 2014-04-13 (×5): qty 8

## 2014-04-13 MED ORDER — DEXTROSE 5 % IV SOLN
500.0000 mg | INTRAVENOUS | Status: DC
  Administered 2014-04-13 – 2014-04-14 (×2): 500 mg via INTRAVENOUS
  Filled 2014-04-13 (×2): qty 500

## 2014-04-13 MED ORDER — DEXTROSE 50 % IV SOLN
25.0000 mL | Freq: Once | INTRAVENOUS | Status: AC | PRN
  Administered 2014-04-13: 25 mL via INTRAVENOUS

## 2014-04-13 MED ORDER — DEXTROSE 50 % IV SOLN
INTRAVENOUS | Status: AC
  Filled 2014-04-13: qty 50

## 2014-04-13 MED ORDER — CETYLPYRIDINIUM CHLORIDE 0.05 % MT LIQD
7.0000 mL | Freq: Two times a day (BID) | OROMUCOSAL | Status: DC
  Administered 2014-04-13 – 2014-04-16 (×6): 7 mL via OROMUCOSAL

## 2014-04-13 MED ORDER — DEXTROSE 50 % IV SOLN
INTRAVENOUS | Status: AC
Start: 2014-04-13 — End: 2014-04-13
  Filled 2014-04-13: qty 50

## 2014-04-13 MED ORDER — DEXTROSE 5 % IV SOLN
1.0000 g | INTRAVENOUS | Status: DC
  Administered 2014-04-13 – 2014-04-15 (×3): 1 g via INTRAVENOUS
  Filled 2014-04-13 (×4): qty 10

## 2014-04-13 NOTE — H&P (Signed)
PULMONARY / CRITICAL CARE MEDICINE   Name: Makayla Rodriguez MRN: [id number] DOB: 02-05-1932    ADMISSION DATE:  04/12/2014 CONSULTATION DATE:  04/12/2014   REFERRING MD :  EDP  CHIEF COMPLAINT:  Hypercarbic respiratory failure  INITIAL PRESENTATION: Increased somnolence and hypoxia.  STUDIES:  CXR 04/12/14 --> Increased pulmonary interstitial markings, possible small bilateral pleural effusions.   SIGNIFICANT EVENTS: BiPap initiated 04-12-14  SUBJECTIVE: no distress, neg 1 liter, improved  VITAL SIGNS: Temp:  [98 F (36.7 C)-98.9 F (37.2 C)] 98 F (36.7 C) (11/15 0455) Pulse Rate:  [47-99] 55 (11/15 0900) Resp:  [14-28] 19 (11/15 0900) BP: (101-147)/(32-100) 136/73 mmHg (11/15 0900) SpO2:  [93 %-100 %] 99 % (11/15 0900) FiO2 (%):  [35 %-45 %] 35 % (11/15 0811) Weight:  [133.4 kg (294 lb 1.5 oz)] 133.4 kg (294 lb 1.5 oz) (11/15 0301) HEMODYNAMICS:   VENTILATOR SETTINGS: Vent Mode:  [-] BIPAP FiO2 (%):  [35 %-45 %] 35 % Set Rate:  [15 bmp-20 bmp] 20 bmp PEEP:  [5 cmH20] 5 cmH20 INTAKE / OUTPUT:  Intake/Output Summary (Last 24 hours) at 04/13/14 1105 Last data filed at 04/13/14 0700  Gross per 24 hour  Intake      0 ml  Output   1225 ml  Net  -1225 ml    PHYSICAL EXAMINATION: General:  No distress off NIMV Neuro:  Moves all extremities awake, calm HEENT:  PERRL, EOMI, BiPap off Cardiovascular:  NRRR, no MRG Lungs:  Reduced, coarse Abdomen:  Soft, NTND, obese Musculoskeletal:  No joint deformities Skin:  No rashes or other lesions.   LABS:  CBC  Recent Labs Lab 04/12/14 1730 04/13/14 0402  WBC 8.7 9.1  HGB 13.0 11.4*  HCT 43.5 38.0  PLT 203 182   Coag's No results for input(s): APTT, INR in the last 168 hours. BMET  Recent Labs Lab 04/12/14 1730 04/13/14 0402  NA 143 140  K 5.2 4.3  CL 97 94*  CO2 37* 37*  BUN 28* 30*  CREATININE 0.82 0.86  GLUCOSE 232* 85   Electrolytes  Recent Labs Lab 04/12/14 1730 04/13/14 0402  CALCIUM 9.4  9.3   Sepsis Markers No results for input(s): LATICACIDVEN, PROCALCITON, O2SATVEN in the last 168 hours. ABG  Recent Labs Lab 04/12/14 1750 04/12/14 2120 04/13/14 0412  PHART 7.201* 7.207* 7.358  PCO2ART 92.7* 99.9* 67.4*  PO2ART 63.8* 96.9 92.7   Liver Enzymes  Recent Labs Lab 04/12/14 1730  AST 16  ALT 13  ALKPHOS 61  BILITOT 0.3  ALBUMIN 3.5   Cardiac Enzymes  Recent Labs Lab 04/12/14 1730  TROPONINI <0.30   Glucose  Recent Labs Lab 04/12/14 2146 04/13/14 0038 04/13/14 0353 04/13/14 0433 04/13/14 1006 04/13/14 1055  GLUCAP 222* 126* 69* 115* 64* 109*    Imaging Dg Chest Portable 1 View  04/12/2014   CLINICAL DATA:  Shortness of breath.  ICD10: R 6.02.  EXAM: PORTABLE CHEST - 1 VIEW  COMPARISON:  09/28/2013  FINDINGS: Mildly degraded exam due to AP portable technique and patient body habitus. Suboptimal patient positioning with patient rotation to the right. Moderate cardiomegaly. Cannot exclude small bilateral pleural effusions. No pneumothorax. Diffuse interstitial and airspace disease. Slightly lower lobe predominant and greater on the right.  IMPRESSION: Cardiomegaly with diffuse interstitial and airspace opacities. At least partially felt to be due to congestive heart failure. Concurrent bibasilar infection or aspiration cannot be excluded. Consider short term radiographic follow up.  Possible small bilateral pleural effusions.   Electronically  Signed   By: Abigail Miyamoto M.D.   On: 04/12/2014 19:46     ASSESSMENT / PLAN:  PULMONARY A:Hypercarbic respiratory failure 2/2 OHS and benzodiazepines, pulm edema component P:   Continue BiPap, interrupt now, likely needs to use cycled scheduled but less frequent Nocturnal mandatory nimv Repeat ABG pending improved, no repeat Albuterol/Ippratroprium q4, albuterol q2PRN Maintain sats between 88 and 94% Does wear 2-3L O2 at home. Neg balance maintain  CARDIOVASCULAR A: H/o diastolic CHF with pulmonary  edema on CXR P:  Lasix q6x 4 doses, continued but reduce freq Strict ins and outs Place foley catheter to monitor urine output.  Neg balance remains as goal  RENAL A:  pulm edema P:   Monitor Cr and urine output Lasix to q8h kvo  GASTROINTESTINAL A:  At risk for stress ulcer P:   Continue protonix Start diet   INFECTIOUS A:  Tooth abscess? P:   BCx2 04/12/14 UC 04/12/14 Abx: Ceftriaxone, Azithro, start date11/14/15>>>  likely can dc azithro in am  May need dentist in house to see and panarex when resp status allows  ENDOCRINE A:  DM2  , int low glu P:   SSI q4, holding home oral agents while mental status altered Add diet re eval if need to dc insluin  NEUROLOGIC A:  AMS- improved P:   RASS goal: 0 2/2 benzos and hypercarbia Treat resp failure   FAMILY  - Updates: spoke with sister by phone at time of admission.  Updated to plan and pt status.   - Inter-disciplinary family meet or Palliative Care meeting due by:  04/19/14    TODAY'S SUMMARY: improved, lasix to q8h, dc NIMV and re eval, use noctrunam NIMV, pcxr in am      Lavon Paganini. Titus Mould, MD, FACP Pgr: [facility]  Pulmonary and [place] Pager: [phone]  04/13/2014, 11:05 AM

## 2014-04-13 NOTE — Progress Notes (Signed)
  Echocardiogram 2D Echocardiogram has been performed.  Makayla Rodriguez 04/13/2014, 9:58 AM

## 2014-04-13 NOTE — Progress Notes (Signed)
Hypoglycemic Event  CBG: 69  Treatment: D50 56ml  Symptoms: none  Follow-up CBG: [id number] CBG Result:115  Possible Reasons for Event: novolog 7 units given at 2000  Comments/MD notified:   Pricilla Riffle  Remember to initiate Hypoglycemia Order Set & complete

## 2014-04-13 NOTE — ED Notes (Signed)
Spoke with JC in pharmacy and he is sending medication now since we are holding oin ED

## 2014-04-13 NOTE — ED Notes (Signed)
Pt moved from ER stretcher to hospital bed with help of EMS staff,  Autumn RN ,  Margaretmary Eddy,  Domingo Dimes  RN  And this Probation officer.  Pt was also cleaned with cleanser and dried,

## 2014-04-13 NOTE — Progress Notes (Signed)
Pt removed from BIPAP and placed on 4 LPM [place], Pt tolerating well at this time, Pt is alert and oriented, vitals are WNL.  RT to monitor and assess as needed.

## 2014-04-13 NOTE — Progress Notes (Signed)
CBG 64  Patient asymptomatic 25 ml of dextrose given will recheck in 15 minutes and notify MD.

## 2014-04-14 ENCOUNTER — Inpatient Hospital Stay (HOSPITAL_COMMUNITY): Payer: Medicare Other

## 2014-04-14 DIAGNOSIS — J9612 Chronic respiratory failure with hypercapnia: Secondary | ICD-10-CM

## 2014-04-14 LAB — CBC WITH DIFFERENTIAL/PLATELET
BASOS ABS: 0 10*3/uL (ref 0.0–0.1)
BASOS PCT: 0 % (ref 0–1)
EOS ABS: 0.2 10*3/uL (ref 0.0–0.7)
Eosinophils Relative: 3 % (ref 0–5)
HEMATOCRIT: 37.3 % (ref 36.0–46.0)
Hemoglobin: 11.4 g/dL — ABNORMAL LOW (ref 12.0–15.0)
Lymphocytes Relative: 25 % (ref 12–46)
Lymphs Abs: 2 10*3/uL (ref 0.7–4.0)
MCH: 29.3 pg (ref 26.0–34.0)
MCHC: 30.6 g/dL (ref 30.0–36.0)
MCV: 95.9 fL (ref 78.0–100.0)
MONO ABS: 0.9 10*3/uL (ref 0.1–1.0)
Monocytes Relative: 11 % (ref 3–12)
NEUTROS ABS: 4.8 10*3/uL (ref 1.7–7.7)
Neutrophils Relative %: 61 % (ref 43–77)
Platelets: 190 10*3/uL (ref 150–400)
RBC: 3.89 MIL/uL (ref 3.87–5.11)
RDW: 12.9 % (ref 11.5–15.5)
WBC: 7.9 10*3/uL (ref 4.0–10.5)

## 2014-04-14 LAB — GLUCOSE, CAPILLARY
GLUCOSE-CAPILLARY: 83 mg/dL (ref 70–99)
GLUCOSE-CAPILLARY: 151 mg/dL — AB (ref 70–99)
GLUCOSE-CAPILLARY: 194 mg/dL — AB (ref 70–99)
Glucose-Capillary: 131 mg/dL — ABNORMAL HIGH (ref 70–99)
Glucose-Capillary: 248 mg/dL — ABNORMAL HIGH (ref 70–99)
Glucose-Capillary: 249 mg/dL — ABNORMAL HIGH (ref 70–99)
Glucose-Capillary: 93 mg/dL (ref 70–99)

## 2014-04-14 LAB — BASIC METABOLIC PANEL
Anion gap: 10 (ref 5–15)
BUN: 28 mg/dL — ABNORMAL HIGH (ref 6–23)
CO2: 40 mEq/L (ref 19–32)
Calcium: 9.1 mg/dL (ref 8.4–10.5)
Chloride: 92 mEq/L — ABNORMAL LOW (ref 96–112)
Creatinine, Ser: 0.86 mg/dL (ref 0.50–1.10)
GFR calc non Af Amer: 62 mL/min — ABNORMAL LOW (ref >90)
GFR, EST AFRICAN AMERICAN: 72 mL/min — AB (ref >90)
Glucose, Bld: 83 mg/dL (ref 70–99)
POTASSIUM: 3.7 meq/L (ref 3.7–5.3)
SODIUM: 142 meq/L (ref 137–147)

## 2014-04-14 LAB — HEMOGLOBIN A1C
HEMOGLOBIN A1C: 7.7 % — AB (ref <5.7)
Mean Plasma Glucose: 174 mg/dL — ABNORMAL HIGH (ref <117)

## 2014-04-14 LAB — MAGNESIUM: MAGNESIUM: 1.8 mg/dL (ref 1.5–2.5)

## 2014-04-14 LAB — PHOSPHORUS: Phosphorus: 2.2 mg/dL — ABNORMAL LOW (ref 2.3–4.6)

## 2014-04-14 MED ORDER — IPRATROPIUM-ALBUTEROL 0.5-2.5 (3) MG/3ML IN SOLN
3.0000 mL | Freq: Four times a day (QID) | RESPIRATORY_TRACT | Status: DC
  Administered 2014-04-14 – 2014-04-15 (×4): 3 mL via RESPIRATORY_TRACT
  Filled 2014-04-14 (×4): qty 3

## 2014-04-14 MED ORDER — SALINE SPRAY 0.65 % NA SOLN
1.0000 | NASAL | Status: DC | PRN
  Filled 2014-04-14: qty 44

## 2014-04-14 MED ORDER — MAGNESIUM SULFATE 2 GM/50ML IV SOLN
2.0000 g | Freq: Once | INTRAVENOUS | Status: AC
  Administered 2014-04-14: 2 g via INTRAVENOUS
  Filled 2014-04-14: qty 50

## 2014-04-14 MED ORDER — ALBUTEROL SULFATE (2.5 MG/3ML) 0.083% IN NEBU
2.5000 mg | INHALATION_SOLUTION | RESPIRATORY_TRACT | Status: DC | PRN
Start: 2014-04-14 — End: 2014-04-16

## 2014-04-14 MED ORDER — DICLOFENAC SODIUM 1 % TD GEL
2.0000 g | Freq: Four times a day (QID) | TRANSDERMAL | Status: DC | PRN
  Filled 2014-04-14: qty 100

## 2014-04-14 MED ORDER — AZELASTINE HCL 0.1 % NA SOLN
2.0000 | Freq: Two times a day (BID) | NASAL | Status: DC
  Administered 2014-04-14 – 2014-04-16 (×5): 2 via NASAL
  Filled 2014-04-14: qty 30

## 2014-04-14 NOTE — Progress Notes (Signed)
Pt requesting to come off BIPAP at this time, Pt placed on 3 LPM [place] and tolerating well at this time, RT to monitor and assess as needed.

## 2014-04-14 NOTE — Progress Notes (Signed)
PULMONARY / CRITICAL CARE MEDICINE   Name: Makayla Rodriguez MRN: [id number] DOB: Oct 02, 1931    ADMISSION DATE:  04/12/2014 CONSULTATION DATE:  04/12/2014   REFERRING MD :  EDP  CHIEF COMPLAINT:  Hypercarbic respiratory failure  INITIAL PRESENTATION: 80 y/o F with PMH of OHS on nasal CPAP, dCHF, DM, Anxiety, Macular Degeneration admitted 11/14 with increased somnolence and hypoxia.  STUDIES:  11/14  CXR > Increased pulmonary interstitial markings, possible small bilateral pleural effusions.   SIGNIFICANT EVENTS: 11/14  Admit with hypercarbic resp fx, BiPAP initiated  11/15  Neg 1L with lasix, improved resp status 11/16  Improved O2 needs, on [place] 3L  SUBJECTIVE: Pt reports wearing BiPAP last night but had difficulty due to noisy machine.  Wears nasal mask at home but is a "mouth breather".  She does not have a chin strap.     VITAL SIGNS: Temp:  [98.7 F (37.1 C)-98.9 F (37.2 C)] 98.7 F (37.1 C) (11/15 2000) Pulse Rate:  [51-112] 79 (11/16 0700) Resp:  [17-26] 19 (11/16 0700) BP: (106-162)/(32-108) 146/68 mmHg (11/16 0700) SpO2:  [91 %-99 %] 96 % (11/16 0740) FiO2 (%):  [35 %] 35 % (11/15 2339)   HEMODYNAMICS:     VENTILATOR SETTINGS: Vent Mode:  [-]  FiO2 (%):  [35 %] 35 %   INTAKE / OUTPUT:  Intake/Output Summary (Last 24 hours) at 04/14/14 0904 Last data filed at 04/14/14 0700  Gross per 24 hour  Intake    494 ml  Output   4525 ml  Net  -4031 ml    PHYSICAL EXAMINATION: General:  Morbidly obese female in NAD Neuro:  Moves all extremities awake, calm HEENT:  PERRL, EOMI, poor dentition Cardiovascular:  s1s2 rrr, no MRG Lungs:  resp's even/non-labored, lungs bilaterally distant but clear Abdomen:  Soft, NTND, obese Musculoskeletal:  No joint deformities Skin:  No rashes or other lesions.   LABS:  CBC  Recent Labs Lab 04/12/14 1730 04/13/14 0402 04/14/14 0337  WBC 8.7 9.1 7.9  HGB 13.0 11.4* 11.4*  HCT 43.5 38.0 37.3  PLT 203 182 190     BMET  Recent Labs Lab 04/12/14 1730 04/13/14 0402 04/14/14 0337  NA 143 140 142  K 5.2 4.3 3.7  CL 97 94* 92*  CO2 37* 37* 40*  BUN 28* 30* 28*  CREATININE 0.82 0.86 0.86  GLUCOSE 232* 85 83   Electrolytes  Recent Labs Lab 04/12/14 1730 04/13/14 0402 04/14/14 0337  CALCIUM 9.4 9.3 9.1  MG  --   --  1.8  PHOS  --   --  2.2*   ABG  Recent Labs Lab 04/12/14 1750 04/12/14 2120 04/13/14 0412  PHART 7.201* 7.207* 7.358  PCO2ART 92.7* 99.9* 67.4*  PO2ART 63.8* 96.9 92.7   Liver Enzymes  Recent Labs Lab 04/12/14 1730  AST 16  ALT 13  ALKPHOS 61  BILITOT 0.3  ALBUMIN 3.5   Cardiac Enzymes  Recent Labs Lab 04/12/14 1730  TROPONINI <0.30   Glucose  Recent Labs Lab 04/13/14 1142 04/13/14 1637 04/13/14 2009 04/14/14 0004 04/14/14 0353 04/14/14 0744  GLUCAP 105* 92 79 131* 83 93    Imaging Dg Chest Port 1 View  04/13/2014   CLINICAL DATA:  Pulmonary edema, history of diabetes, hypertension  EXAM: PORTABLE CHEST - 1 VIEW  COMPARISON:  04/12/2014  FINDINGS: There is diffuse bilateral interstitial thickening. There is a trace left pleural effusion. There is no pneumothorax. There is stable cardiomegaly. The osseous structures are unremarkable.  IMPRESSION: Findings most concerning for CHF.   Electronically Signed   By: Kathreen Devoid   On: 04/13/2014 05:42     ASSESSMENT / PLAN:  PULMONARY A: Hypercarbic respiratory failure 2/2 OHS and benzodiazepines, pulm edema component Oxygen Dependent  OHS  Pulmonary Edema  P:   Change BiPAP to QHS mandatory and PRN. Arrange for outpatient chin strap, she uses nasal CPAP and is a reported "mouth breather"  Albuterol/Ippratroprium q6, albuterol q3PRN Maintain sats between 88 and 94% Does wear 2-3L O2 at home. Continue daily assessment of I/O's, lasix need.  Currently on 80 Q8 Astelin BID  CARDIOVASCULAR A:  Diastolic CHF - with pulmonary edema on CXR P:  Lasix 80 Q8 Monitor I/O's Foley catheter to  monitor urine output.  Negative balance as sr cr / BP tolerate   RENAL A:  No acute issues - at risk hypokalemia / AKI with lasix dosing  P:   Monitor Cr and urine output Lasix q8h Trend BMP KVO IVF Replace electrolytes as indicated.  GASTROINTESTINAL A:   Morbid Obesity At Risk Stress Ulcer P:   Continue protonix Carb modified diet  INFECTIOUS A:   Tooth abscess? P:   BCx2 11/14 >> UC 11/14 >> Abx: Ceftriaxone, start date 11/14 >> Azithro, start date 11/14 >>   Consider narrow abx after follow cxr in am 11/17 May need dentist in house to see and panarex when resp status allows  ENDOCRINE A:   DM2   Hypoglycemia - on admit  P:   SSI q4 Hold home oral agents  Monitor glucose with diet  NEUROLOGIC A:   AMS - resolved, secondary to benzo's & hypercarbia  Macular Degeneration  P:   RASS goal: 0 Monitor   FAMILY  - Updates: patient updated.  Change to SDU status.    - Inter-disciplinary family meet or Palliative Care meeting due by:  04/19/14  Noe Gens, NP-C [facility] Pgr: [phone] or [phone]  Note above edited in full, transfer to SDU, begin ambulation, titrate O2, continue diureses and replace electrolytes as needed, will f/u in AM.  Patient seen and examined, agree with above note.  I dictated the care and orders written for this patient under my direction.  Rush Farmer, MD [phone]

## 2014-04-15 ENCOUNTER — Inpatient Hospital Stay (HOSPITAL_COMMUNITY): Payer: Medicare Other

## 2014-04-15 LAB — CBC
HEMATOCRIT: 40.4 % (ref 36.0–46.0)
Hemoglobin: 12.5 g/dL (ref 12.0–15.0)
MCH: 29.3 pg (ref 26.0–34.0)
MCHC: 30.9 g/dL (ref 30.0–36.0)
MCV: 94.8 fL (ref 78.0–100.0)
Platelets: 198 10*3/uL (ref 150–400)
RBC: 4.26 MIL/uL (ref 3.87–5.11)
RDW: 13.1 % (ref 11.5–15.5)
WBC: 7.7 10*3/uL (ref 4.0–10.5)

## 2014-04-15 LAB — BASIC METABOLIC PANEL
Anion gap: 11 (ref 5–15)
BUN: 34 mg/dL — ABNORMAL HIGH (ref 6–23)
CALCIUM: 9.7 mg/dL (ref 8.4–10.5)
CO2: 42 meq/L — AB (ref 19–32)
CREATININE: 1 mg/dL (ref 0.50–1.10)
Chloride: 90 mEq/L — ABNORMAL LOW (ref 96–112)
GFR calc Af Amer: 60 mL/min — ABNORMAL LOW (ref >90)
GFR calc non Af Amer: 52 mL/min — ABNORMAL LOW (ref >90)
GLUCOSE: 138 mg/dL — AB (ref 70–99)
Potassium: 3.6 mEq/L — ABNORMAL LOW (ref 3.7–5.3)
SODIUM: 143 meq/L (ref 137–147)

## 2014-04-15 LAB — GLUCOSE, CAPILLARY
GLUCOSE-CAPILLARY: 102 mg/dL — AB (ref 70–99)
Glucose-Capillary: 140 mg/dL — ABNORMAL HIGH (ref 70–99)
Glucose-Capillary: 165 mg/dL — ABNORMAL HIGH (ref 70–99)
Glucose-Capillary: 263 mg/dL — ABNORMAL HIGH (ref 70–99)
Glucose-Capillary: 169 mg/dL — ABNORMAL HIGH (ref 70–99)

## 2014-04-15 LAB — MAGNESIUM: Magnesium: 2.2 mg/dL (ref 1.5–2.5)

## 2014-04-15 LAB — PRO B NATRIURETIC PEPTIDE: Pro B Natriuretic peptide (BNP): 976 pg/mL — ABNORMAL HIGH (ref 0–450)

## 2014-04-15 LAB — PHOSPHORUS: PHOSPHORUS: 3.4 mg/dL (ref 2.3–4.6)

## 2014-04-15 MED ORDER — TORSEMIDE 20 MG PO TABS
20.0000 mg | ORAL_TABLET | Freq: Every day | ORAL | Status: DC
  Administered 2014-04-15 – 2014-04-16 (×2): 20 mg via ORAL
  Filled 2014-04-15 (×3): qty 1

## 2014-04-15 MED ORDER — NEBIVOLOL HCL 5 MG PO TABS
5.0000 mg | ORAL_TABLET | Freq: Every day | ORAL | Status: DC
  Administered 2014-04-15 – 2014-04-16 (×2): 5 mg via ORAL
  Filled 2014-04-15 (×2): qty 1

## 2014-04-15 MED ORDER — GLIPIZIDE ER 10 MG PO TB24
10.0000 mg | ORAL_TABLET | Freq: Every day | ORAL | Status: DC
  Administered 2014-04-15 – 2014-04-16 (×2): 10 mg via ORAL
  Filled 2014-04-15 (×3): qty 1

## 2014-04-15 MED ORDER — SERTRALINE HCL 50 MG PO TABS
50.0000 mg | ORAL_TABLET | Freq: Every day | ORAL | Status: DC
  Administered 2014-04-15: 25 mg via ORAL
  Administered 2014-04-16: 50 mg via ORAL
  Filled 2014-04-15 (×2): qty 1

## 2014-04-15 MED ORDER — INSULIN ASPART 100 UNIT/ML ~~LOC~~ SOLN
0.0000 [IU] | Freq: Three times a day (TID) | SUBCUTANEOUS | Status: DC
Start: 2014-04-15 — End: 2014-04-16
  Administered 2014-04-15: 8 [IU] via SUBCUTANEOUS
  Administered 2014-04-15: 3 [IU] via SUBCUTANEOUS
  Administered 2014-04-16 (×2): 2 [IU] via SUBCUTANEOUS

## 2014-04-15 MED ORDER — LISINOPRIL 5 MG PO TABS
5.0000 mg | ORAL_TABLET | Freq: Every day | ORAL | Status: DC
  Administered 2014-04-15 – 2014-04-16 (×2): 5 mg via ORAL
  Filled 2014-04-15 (×2): qty 1

## 2014-04-15 MED ORDER — LINAGLIPTIN 5 MG PO TABS
5.0000 mg | ORAL_TABLET | Freq: Every day | ORAL | Status: DC
  Administered 2014-04-15 – 2014-04-16 (×2): 5 mg via ORAL
  Filled 2014-04-15 (×2): qty 1

## 2014-04-15 MED ORDER — FUROSEMIDE 10 MG/ML IJ SOLN
40.0000 mg | Freq: Two times a day (BID) | INTRAMUSCULAR | Status: DC
Start: 2014-04-15 — End: 2014-04-15

## 2014-04-15 MED ORDER — ACETAMINOPHEN 325 MG PO TABS
650.0000 mg | ORAL_TABLET | ORAL | Status: DC | PRN
  Administered 2014-04-15: 650 mg via ORAL
  Filled 2014-04-15: qty 2

## 2014-04-15 NOTE — Progress Notes (Signed)
Pt refuses to keep Bipap on complaining of claustrophobia and that the "air is too cold." The importance of wearing Bipap was explained by RT and RN however pt refuses to wear Bipap. Pt prefers to wear nasal CPAP. Pt is wearing 3L nasal cannula SpO2 94%. RT will continue to monitor.

## 2014-04-15 NOTE — Progress Notes (Signed)
CRITICAL VALUE ALERT  Critical value received:  C02 42  Date of notification:  04/15/14   Time of notification:  0500  Critical value read back:Yes.    Nurse who received alert:  Star Age, RN  MD notified (1st page):  Dr Deterding  Time of first page:  0503  MD notified (2nd page):  Time of second page:  Responding MD:  Dr. Jimmy Footman  Time MD responded:  [phone]

## 2014-04-15 NOTE — Clinical Documentation Improvement (Signed)
'  Increasing lethargy and confusion over the past 2 days'; 'Acute respiratory failure with hypoxia'; 'Altered mental status likely secondary to CO2 of 92'; Altered mental status 'secondary to benzo's and hypercarbia' documented in chart.  After study, can the 'Altered Mental Status' be further clarified? If so, please document in your progress note and carry over to the discharge summary.    Possible Clinical Conditions: -acute encephalopathy -encephalopathy, other (please specify) -Altered mental status only -Other condition  Thank you, Mateo Flow, RN [phone] Clinical Documentation Specialist

## 2014-04-15 NOTE — Progress Notes (Signed)
Full report given to 4W RN Tess. Pt stable for transport.

## 2014-04-15 NOTE — Progress Notes (Signed)
Rt pulled V60 out of room and placed a CPAP (black box) in room for QHS per MD order.

## 2014-04-15 NOTE — Clinical Documentation Improvement (Addendum)
Current notes reflect 'Diastolic CHF - with pulmonary edema on CXR' and "11/17 Neg 10L over past 72 hours".  Please specify the acuity of the diastolic CHF (acute, acute on chronic, chronic only) and document in your progress note and carry over to the discharge summary.   Thank you, Mateo Flow, RN [phone] Clinical Documentation Specialist

## 2014-04-15 NOTE — Progress Notes (Signed)
PT Cancellation Note  Patient Details Name: Shaquanda Graves MRN: [id number] DOB: 1932-05-15   Cancelled Treatment:    Reason Eval/Treat Not Completed: Fatigue/lethargy limiting ability to participate (pt pleasantly stated  that she did not want to get up. stated she had been up already)   Claretha Cooper 04/15/2014, 4:45 PM Tresa Endo PT [phone]

## 2014-04-15 NOTE — Progress Notes (Signed)
RT placed patient on CPAP. Patient setting is auto CPAP (min 5 and max 20). Sterile water added to water chamber for humidification. 2 liter oxygen bleed into tubing.Rt will continue to monitor as needed.

## 2014-04-15 NOTE — Progress Notes (Signed)
PULMONARY / CRITICAL CARE MEDICINE   Name: Makayla Rodriguez MRN: [id number] DOB: 01-10-1932    ADMISSION DATE:  04/12/2014 CONSULTATION DATE:  04/12/2014   REFERRING MD :  EDP  CHIEF COMPLAINT:  Hypercarbic respiratory failure  INITIAL PRESENTATION: 79 y/o F with PMH of OHS on nasal CPAP, dCHF, DM, Anxiety, Macular Degeneration admitted 11/14 with increased somnolence and hypoxia.  STUDIES:  11/14  CXR > Increased pulmonary interstitial markings, possible small bilateral pleural effusions.   SIGNIFICANT EVENTS: 11/14  Admit with hypercarbic resp fx, BiPAP initiated  11/15  Neg 1L with lasix, improved resp status 11/16  Improved O2 needs, on [place] 3L 11/17  Neg 10L over past 72 hours  SUBJECTIVE:   Pt reports wearing bipap for ~ 2hours, no acute events.  Neg 10 L over past 72 hours.     VITAL SIGNS: Temp:  [98 F (36.7 C)-100.1 F (37.8 C)] 98.4 F (36.9 C) (11/17 0400) Pulse Rate:  [59-93] 67 (11/17 0600) Resp:  [20-26] 22 (11/17 0600) BP: (98-164)/(61-77) 136/66 mmHg (11/17 0600) SpO2:  [89 %-97 %] 94 % (11/17 0600) FiO2 (%):  [35 %] 35 % (11/16 2255) Weight:  [279 lb 8.7 oz (126.8 kg)] 279 lb 8.7 oz (126.8 kg) (11/17 0500)    VENTILATOR SETTINGS: Vent Mode:  [-]  FiO2 (%):  [35 %] 35 %   INTAKE / OUTPUT:  Intake/Output Summary (Last 24 hours) at 04/15/14 0816 Last data filed at 04/15/14 0517  Gross per 24 hour  Intake    300 ml  Output   6150 ml  Net  -5850 ml    PHYSICAL EXAMINATION: General:  Morbidly obese female in NAD Neuro:  Moves all extremities awake, calm HEENT:  PERRL, EOMI, poor dentition Cardiovascular:  s1s2 rrr, no MRG Lungs:  resp's even/non-labored, lungs bilaterally distant but clear Abdomen:  Soft, NTND, obese Musculoskeletal:  No joint deformities Skin:  No rashes or other lesions.   LABS:  CBC  Recent Labs Lab 04/13/14 0402 04/14/14 0337 04/15/14 0410  WBC 9.1 7.9 7.7  HGB 11.4* 11.4* 12.5  HCT 38.0 37.3 40.4  PLT 182 190 198     BMET  Recent Labs Lab 04/13/14 0402 04/14/14 0337 04/15/14 0410  NA 140 142 143  K 4.3 3.7 3.6*  CL 94* 92* 90*  CO2 37* 40* 42*  BUN 30* 28* 34*  CREATININE 0.86 0.86 1.00  GLUCOSE 85 83 138*   Electrolytes  Recent Labs Lab 04/13/14 0402 04/14/14 0337 04/15/14 0410  CALCIUM 9.3 9.1 9.7  MG  --  1.8 2.2  PHOS  --  2.2* 3.4   ABG  Recent Labs Lab 04/12/14 1750 04/12/14 2120 04/13/14 0412  PHART 7.201* 7.207* 7.358  PCO2ART 92.7* 99.9* 67.4*  PO2ART 63.8* 96.9 92.7   Liver Enzymes  Recent Labs Lab 04/12/14 1730  AST 16  ALT 13  ALKPHOS 61  BILITOT 0.3  ALBUMIN 3.5   Cardiac Enzymes  Recent Labs Lab 04/12/14 1730 04/15/14 0410  TROPONINI <0.30  --   PROBNP  --  976.0*   Glucose  Recent Labs Lab 04/14/14 0353 04/14/14 0744 04/14/14 1201 04/14/14 1556 04/14/14 2008 04/14/14 2313  GLUCAP 83 93 151* 249* 248* 194*    Imaging Dg Chest Port 1 View  04/14/2014   CLINICAL DATA:  Obstructive sleep apnea with history of COPD, CHF, and diabetes  EXAM: PORTABLE CHEST - 1 VIEW  COMPARISON:  Portable chest x-ray of April 13, 2014  FINDINGS: The lungs  are slightly better inflated today. The retrocardiac region on the left remains is more dense and blunting of the left lateral costophrenic angle persists. The pulmonary interstitial markings are slightly less prominent today. There is stable cardiomegaly. The bony structures exhibit no acute abnormalities.  IMPRESSION: Low-grade CHF. There is left lower lobe atelectasis and small left pleural effusion slightly more conspicuous today.  A follow-up PA and lateral chest x-ray with deep inspiration would be useful.   Electronically Signed   By: David  Martinique   On: 04/14/2014 07:41     ASSESSMENT / PLAN:  PULMONARY A: Hypercarbic respiratory failure 2/2 OHS and benzodiazepines, pulm edema component Oxygen Dependent  OHS  Pulmonary Edema  P:   Change to autoset CPAP QHS with nasal mask Arrange for  outpatient chin strap, she uses nasal CPAP and is a reported "mouth breather"  Albuterol/Ippratroprium q6, albuterol q3PRN Maintain sats between 88 and 94% Does wear 2-3L O2 at home. Continue daily assessment of I/O's, lasix need.   Astelin BID  CARDIOVASCULAR A:  Diastolic CHF - with pulmonary edema on CXR P:  Decrease lasix to 40 BID, on 20 mg Torsemide at home Monitor I/O's Foley catheter to monitor urine output.  Negative balance as sr cr / BP tolerate  Resume Bystolic   RENAL A:  No acute issues - at risk hypokalemia / AKI with lasix dosing  P:   Monitor Cr and urine output Reduce lasix to 40 Q12 Trend BMP KVO IVF Replace electrolytes as indicated.  GASTROINTESTINAL A:   Morbid Obesity At Risk Stress Ulcer P:   Continue protonix Carb modified diet  INFECTIOUS A:   Tooth abscess? P:   BCx2 11/14 >> UC 11/14 >> ? If done Abx: Ceftriaxone, start date 11/14 >> Azithro, start date 11/14 >> 11/16  Narrow abx 11/17 May need dentist in house to see and panarex when resp status allows  ENDOCRINE A:   DM2   Hypoglycemia - on admit  P:   Change SSI to ACHS Resume home oral agents:  Glucotrol, onglyza  Monitor glucose with diet  NEUROLOGIC A:   AMS - resolved, secondary to benzo's & hypercarbia  Macular Degeneration  P:   RASS goal: 0 Monitor  Mobilize, PT   FAMILY  - Updates: patient updated, Tx to medical floor.      Noe Gens, NP-C [facility] Pgr: [phone] or [phone]  Reviewed above, and examined.    Her mental status and respiratory status have improved.  Edema decreased.  Not convinced she has dx of COPD >> she does not feel like nebs help much.  Will change nebs to prn.  Will transition to her home medical regimen, and CPAP.  Need to avoid sedative/narcotic medications in the future.    If stable overnight, then she might be ready for d/c home on 11/18.  Chesley Mires, MD [facility] 04/15/2014, 10:41 AM Pager:  [phone] After 3pm call: [phone]

## 2014-04-15 NOTE — Plan of Care (Signed)
Problem: Phase I Progression Outcomes Goal: O2 sats > or equal 90% or at baseline Outcome: Completed/Met Date Met:  04/15/14 Goal: Dyspnea controlled at rest Outcome: Completed/Met Date Met:  04/15/14 Goal: Hemodynamically stable Outcome: Completed/Met Date Met:  04/15/14 Goal: Tolerating diet Outcome: Completed/Met Date Met:  04/15/14

## 2014-04-16 ENCOUNTER — Encounter (HOSPITAL_COMMUNITY): Payer: Self-pay | Admitting: Pulmonary Disease

## 2014-04-16 LAB — GLUCOSE, CAPILLARY
GLUCOSE-CAPILLARY: 146 mg/dL — AB (ref 70–99)
Glucose-Capillary: 176 mg/dL — ABNORMAL HIGH (ref 70–99)

## 2014-04-16 MED ORDER — CLINDAMYCIN HCL 300 MG PO CAPS: 300.0000 mg | ORAL_CAPSULE | Freq: Three times a day (TID) | ORAL | Status: AC

## 2014-04-16 MED ORDER — AZELASTINE HCL 0.1 % NA SOLN: 2.0000 | Freq: Two times a day (BID) | NASAL | Status: AC

## 2014-04-16 MED ORDER — SALINE SPRAY 0.65 % NA SOLN: 1.0000 | NASAL | Status: AC | PRN

## 2014-04-16 NOTE — Discharge Instructions (Signed)
1.  Complete all of your antibiotics as prescribed 2.  Eat yogurt 1-2 times per day for the next week  3.  Home health should set you up with a chin strap for your CPAP 4.  Review your medications carefully as they have changed.  5.  Follow up with Dr. Elsworth Soho as scheduled.

## 2014-04-16 NOTE — Progress Notes (Signed)
PULMONARY / CRITICAL CARE MEDICINE   Name: Makayla Rodriguez MRN: [id number] DOB: 24-Jun-1931    ADMISSION DATE:  04/12/2014 CONSULTATION DATE:  04/12/2014   REFERRING MD :  EDP  CHIEF COMPLAINT:  Hypercarbic respiratory failure  INITIAL PRESENTATION: 80 y/o F with PMH of OHS on nasal CPAP, dCHF, DM, Anxiety, Macular Degeneration admitted 11/14 with increased somnolence and hypoxia.  STUDIES:  11/14  CXR > Increased pulmonary interstitial markings, possible small bilateral pleural effusions.   SIGNIFICANT EVENTS: 11/14  Admit with hypercarbic resp fx, BiPAP initiated  11/15  Neg 1L with lasix, improved resp status 11/16  Improved O2 needs, on [place] 3L 11/17  Neg 10L over past 72 hours  SUBJECTIVE:    Feels much better.  More alert.  Slept well over night with CPAP.  VITAL SIGNS: Temp:  [98.2 F (36.8 C)-99 F (37.2 C)] 98.5 F (36.9 C) (11/18 0547) Pulse Rate:  [69-87] 69 (11/18 0547) Resp:  [18-24] 20 (11/18 0547) BP: (114-134)/(45-68) 118/62 mmHg (11/18 0547) SpO2:  [92 %-96 %] 96 % (11/18 0547) Weight:  [277 lb 12.5 oz (126 kg)] 277 lb 12.5 oz (126 kg) (11/18 0547)   INTAKE / OUTPUT:  Intake/Output Summary (Last 24 hours) at 04/16/14 0954 Last data filed at 04/16/14 0700  Gross per 24 hour  Intake    770 ml  Output   1000 ml  Net   -230 ml    PHYSICAL EXAMINATION: General: pleasant Neuro: normal strength HEENT: no sinus tenderness Cardiovascular: regular Lungs: decreased breath sounds, no wheeze Abdomen:  Soft, non tender Musculoskeletal: no edema Skin: no rashes  LABS:  CBC  Recent Labs Lab 04/13/14 0402 04/14/14 0337 04/15/14 0410  WBC 9.1 7.9 7.7  HGB 11.4* 11.4* 12.5  HCT 38.0 37.3 40.4  PLT 182 190 198    BMET  Recent Labs Lab 04/13/14 0402 04/14/14 0337 04/15/14 0410  NA 140 142 143  K 4.3 3.7 3.6*  CL 94* 92* 90*  CO2 37* 40* 42*  BUN 30* 28* 34*  CREATININE 0.86 0.86 1.00  GLUCOSE 85 83 138*   Electrolytes  Recent Labs Lab  04/13/14 0402 04/14/14 0337 04/15/14 0410  CALCIUM 9.3 9.1 9.7  MG  --  1.8 2.2  PHOS  --  2.2* 3.4   ABG  Recent Labs Lab 04/12/14 1750 04/12/14 2120 04/13/14 0412  PHART 7.201* 7.207* 7.358  PCO2ART 92.7* 99.9* 67.4*  PO2ART 63.8* 96.9 92.7   Liver Enzymes  Recent Labs Lab 04/12/14 1730  AST 16  ALT 13  ALKPHOS 61  BILITOT 0.3  ALBUMIN 3.5   Cardiac Enzymes  Recent Labs Lab 04/12/14 1730 04/15/14 0410  TROPONINI <0.30  --   PROBNP  --  976.0*   Glucose  Recent Labs Lab 04/15/14 0357 04/15/14 0807 04/15/14 1146 04/15/14 1638 04/15/14 2056 04/16/14 0730  GLUCAP 140* 165* 263* 169* 102* 176*    Imaging Dg Chest Port 1 View  04/15/2014   CLINICAL DATA:  Acute respiratory failure with history of COPD, CHF, long-term tobacco use, and diabetes  EXAM: PORTABLE CHEST - 1 VIEW  COMPARISON:  Portable chest x-ray of April 14, 2014  FINDINGS: The lungs are slightly better inflated today. There is persistent atelectasis or infiltrate at the left lung base laterally with small effusion. The cardiac silhouette is top-normal in size. The pulmonary vascularity remains prominent.  IMPRESSION: Persistent atelectasis/pneumonia and small pleural effusion at the left lung base laterally. Mild stable CHF.   Electronically Signed  By: David  Martinique   On: 04/15/2014 08:01     ASSESSMENT / PLAN:   Acute on chronic hypoxic/hypercarbic respiratory failure 2/2 OSA/OHS, benzodiazepines, acute pulm edema. P:   Continue CPAP qhs Can continue prn albuterol as outpt for ?hx of COPD >> will need further assessment as outpt Continue oxygen to keep SpO2 88 to 94%  Hx of pleural mass. Plan: Will need f/u with Dr. Elsworth Soho after CT chest in April 2016  Allergic rhintis. Plan: Astelin BID Flonase qd Prn nasal irrigation  Acute on chronic diastolic CHF - improved. Hx of HTN. P:  D/c foley Continue bystolic, prinivil, demadex Resume ASA  Tooth infection.  Hx of PCN  allergy. Plan: Day 5/7 of Abx >> change to clindamycin 11/18 to complete course of abx F/u with dentist as outpt  Hx of DM2. Hypoglycemia on admit >> resolved. P:   Glucotrol, onglyza   AMS - resolved, secondary to benzo's & hypercarbia >> resolved. Hx of anxiety. Plan: Continue zoloft Avoid Benzo's, narcotics     Hx of Lt breast cancer >> T1Nx grade 2 invasive ductal carcinoma of left breast: ER +, PR and HER2 negative, post lumpectomy 03-2013, not candidate for radiation or hormonal blocker. Plan: F/u with oncology as outpt  SUMMARY: She is ready for d/c home after foley removed.  Will need f/u with PCP within next one week.  She can f/u with Dr. Elsworth Soho within next 1 month.  Chesley Mires, MD [facility] Pulmonary/Critical Care 04/16/2014, 9:54 AM Pager:  [phone] After 3pm call: [phone]

## 2014-04-16 NOTE — Discharge Summary (Signed)
Physician Discharge Summary  Patient ID: Makayla Rodriguez MRN: [id number] DOB/AGE: 10-19-1931 80 y.o.  Admit date: 04/12/2014 Discharge date: 04/16/2014    Discharge Diagnoses:  Acute on Chronic Hypoxic / Hypercarbic Respiratory Failure OSA / OHS  Acute Pulmonary Edema  Pleural Mass Allergic Rhinitis  Acute on Chronic Diastolic CHF Tooth Infection  Hypoglycemia  Acute Encephalopathy secondary to benzodiazepines & hypercarbia.  Anxiety                                                                       DISCHARGE PLAN BY DIAGNOSIS     Acute on chronic hypoxic/hypercarbic respiratory failure 2/2 OSA/OHS, benzodiazepines, acute pulm edema.  Discharge Plan: Continue CPAP qhs, arranged for home chin strap Can continue prn albuterol as outpt for ?hx of COPD >> will need further assessment as outpt Continue oxygen to keep SpO2 88 to 94% Follow up with Dr. Vassie Loll as below  Hx of pleural mass.  Discharge Plan: Will need f/u with Dr. Vassie Loll after CT chest in April 2016  Allergic rhintis.  Discharge Plan: Astelin BID Flonase qd PRN nasal irrigation  Acute on Chronic Diastolic CHF - improved. Hx of HTN.  Discharge Plan:  Continue bystolic, prinivil, demadex Resume ASA  Tooth infection. Hx of PCN allergy.  Discharge Plan: Day 5/7 of Abx >> change to clindamycin 11/18 to complete course of abx F/u with dentist as outpt  Hx of DM2. Hypoglycemia on admit >> resolved.  Discharge Plan: Continue Glucotrol, Onglyza   Acute Encephalopathy - resolved, secondary to benzo's & hypercarbia >> resolved. Hx of anxiety.  Discharge Plan: Continue zoloft Avoid Benzo's, narcotics   Hx of Lt breast cancer >> T1Nx grade 2 invasive ductal carcinoma of left breast: ER +, PR and HER2 negative, post lumpectomy 03-2013, not candidate for radiation or hormonal blocker.  Discharge Plan: F/u with oncology as outpt                    DISCHARGE SUMMARY   Makayla Rodriguez is a  80 y.o. y/o female with a PMH of diastolic CHF, Obesity hypoventilation syndrome on 2L [place] O2, diabetes melitis, possible tooth abscess and anxiety disorder who presented to the [facility] on 11/14 via EMS with her caregiver who reported the patient to have a change in mental status. The patient recently has had a worsening anxiety and the week prior to admission was started on zoloft and xanex by her PCP. Her caregiver reported that the patient was noted to be more lethargic and difficult to arouse for 2 days, decreased PO intake and cough without fever.  She was recently treated for a UTI.  Her caregiver also reported that the patient had not been wearing her CPAP through the entire night the last few nights as she has felt like it is smothering her.     Initial ER work up notable for ABG 7.201 / CO2 92.7 / PO2 63, Na 143, K 5.2, CO2 37, BUN 28, sr cr 0.82, glucose 232, WBC 8.7, & Hgb 13.  She was admitted to ICU and treated with BiPAP support, nebulized bronchodilators, oxygen, lasix, empiric antibiotics for possible tooth abscess and sedating medications held.  The patient made slow improvement.  She was aggressively  diuresed with > 10L negative.  Her oxygen needs improved and she was able to transition off BiPAP to her regular home CPAP.  Of note, the patient did not tolerate the full face mask for bipap well due to claustrophobia.  The patient uses a nasal mask at home but reports she is a mouth breather.  Home chin strap arranged prior to discharge.  She was transitioned to the medical floor and back to her home regimen of medications prior to discharge to ensure tolerance.  Sedating medications stopped at time of discharge.  See plans as above.               STUDIES:  11/14 CXR > Increased pulmonary interstitial markings, possible small bilateral pleural effusions.   SIGNIFICANT EVENTS: 11/14 Admit with hypercarbic resp fx, BiPAP initiated  11/15 Neg 1L with lasix, improved resp  status 11/16 Improved O2 needs, on [place] 3L 11/17 Neg 10L over past 72 hours   Discharge Exam: General: pleasant Neuro: normal strength HEENT: no sinus tenderness Cardiovascular: regular Lungs: decreased breath sounds, no wheeze Abdomen: Soft, non tender Musculoskeletal: no edema Skin: no rashes  Filed Vitals:   04/15/14 1149 04/15/14 1431 04/15/14 2057 04/16/14 0547  BP: 134/61 115/68 114/54 118/62  Pulse: 86 79 81 69  Temp: 98.2 F (36.8 C) 98.6 F (37 C) 99 F (37.2 C) 98.5 F (36.9 C)  TempSrc: Oral Oral Oral Oral  Resp: _0 Height:      Weight:    277 lb 12.5 oz (126 kg)  SpO2: 92% 93% 96% 96%     Discharge Labs  BMET  Recent Labs Lab 04/12/14 1730 04/13/14 0402 04/14/14 0337 04/15/14 0410  NA 143 140 142 143  K 5.2 4.3 3.7 3.6*  CL 97 94* 92* 90*  CO2 37* 37* 40* 42*  GLUCOSE 232* 85 83 138*  BUN 28* 30* 28* 34*  CREATININE 0.82 0.86 0.86 1.00  CALCIUM 9.4 9.3 9.1 9.7  MG  --   --  1.8 2.2  PHOS  --   --  2.2* 3.4   CBC  Recent Labs Lab 04/13/14 0402 04/14/14 0337 04/15/14 0410  HGB 11.4* 11.4* 12.5  HCT 38.0 37.3 40.4  WBC 9.1 7.9 7.7  PLT 182 190 198       Follow-up Information    Follow up with Rigoberto Noel., MD On 04/28/2014.   Specialty:  Pulmonary Disease   Why:  Appt at 11:45    Contact information:   [address] [place] [address] [phone]       Follow up with Dentist . Schedule an appointment as soon as possible for a visit in 1 week.      Follow up with Marjorie Smolder, MD On 04/23/2014.   Specialty:  Family Medicine   Why:  Appt at 3:00 PM   Contact information:   [address] [place] [address] [phone]         Medication List    STOP taking these medications        ALPRAZolam 0.5 MG tablet  Commonly known as:  XANAX      TAKE these medications        acetaminophen 500 MG tablet  Commonly known as:  TYLENOL  Take 500 mg by mouth every 6 (six) hours as  needed.     albuterol 108 (90 BASE) MCG/ACT inhaler  Commonly known as:  PROVENTIL HFA;VENTOLIN HFA  Inhale  2 puffs into the lungs every 4 (four) hours as needed for wheezing or shortness of breath.     aspirin EC 81 MG tablet  Take 81 mg by mouth every morning.     azelastine 0.1 % nasal spray  Commonly known as:  ASTELIN  Place 2 sprays into both nostrils 2 (two) times daily. Use in each nostril as directed     cholecalciferol 1000 UNITS tablet  Commonly known as:  VITAMIN D  Take 1,000 Units by mouth every morning.     Chromium-Cinnamon [phone] MCG-MG Caps  Take 1 tablet by mouth at bedtime.     clindamycin 300 MG capsule  Commonly known as:  CLEOCIN  Take 1 capsule (300 mg total) by mouth 3 (three) times daily.     diclofenac sodium 1 % Gel  Commonly known as:  VOLTAREN  Apply 4 g topically 2 (two) times daily as needed (for joint pain). For joint pain     docusate sodium 100 MG capsule  Commonly known as:  COLACE  Take 100-200 mg by mouth at bedtime as needed for constipation.     fexofenadine 180 MG tablet  Commonly known as:  ALLEGRA  Take 180 mg by mouth at bedtime.     fluticasone 50 MCG/ACT nasal spray  Commonly known as:  FLONASE  Place 2 sprays into the nose 2 (two) times daily.     glipiZIDE 10 MG 24 hr tablet  Commonly known as:  GLUCOTROL XL  Take 10 mg by mouth every morning.     hydroxypropyl methylcellulose / hypromellose 2.5 % ophthalmic solution  Commonly known as:  ISOPTO TEARS / GONIOVISC  Place 1 drop into both eyes 3 (three) times daily as needed (for dry eyes).     lisinopril 5 MG tablet  Commonly known as:  PRINIVIL,ZESTRIL  Take 5 mg by mouth daily.     nebivolol 5 MG tablet  Commonly known as:  BYSTOLIC  Take 5 mg by mouth daily.     ONGLYZA 5 MG Tabs tablet  Generic drug:  saxagliptin HCl  5 mg daily.     pantoprazole 40 MG tablet  Commonly known as:  PROTONIX  Take 40 mg by mouth every morning.     polyethylene glycol  packet  Commonly known as:  MIRALAX / GLYCOLAX  Take 17 g by mouth daily as needed (for constipation).     sertraline 50 MG tablet  Commonly known as:  ZOLOFT  Take 25-50 mg by mouth daily. For the first 6 days take half tab. Then a full tab as of 04-17-14     sodium chloride 0.65 % Soln nasal spray  Commonly known as:  OCEAN  Place 1 spray into both nostrils as needed for congestion.     torsemide 20 MG tablet  Commonly known as:  DEMADEX  Take 20 mg by mouth daily.     TOZAL PO  Take 3 tablets by mouth daily. For eyes     traMADol 50 MG tablet  Commonly known as:  ULTRAM  Take 50 mg by mouth 3 (three) times daily as needed (for pain.).     TUSSIN DM PO  Take 5 mLs by mouth as needed (for cough).          Disposition:  Home.  Chin strap arranged for home CPAP usage.   Discharged Condition: Makayla Rodriguez has met maximum benefit of inpatient care and is medically stable and cleared for discharge.  Patient is  pending follow up as above.      Time spent on disposition:  Greater than 35 minutes.   Signed: Noe Gens, NP-C [facility] Pgr: [phone] Office: [place], [place] 04/16/2014, 11:11 AM Pager:  [phone] After 3pm call: [phone]

## 2014-04-16 NOTE — Progress Notes (Signed)
CARE MANAGEMENT NOTE 04/16/2014  Patient:  Makayla Rodriguez, Makayla Rodriguez   Account Number:  [id number]  Date Initiated:  04/16/2014  Documentation initiated by:  Karl Bales  Subjective/Objective Assessment:   Pt trans from ICU with cco resp failure     Action/Plan:   from home with 24/7 sitters   Anticipated DC Date:  04/16/2014   Anticipated DC Plan:  [place]  CM consult      Choice offered to / List presented to:             Status of service:  Completed, signed off Medicare Important Message given?  YES (If response is "NO", the following Medicare IM given date fields will be blank) Date Medicare IM given:  04/15/2014 Medicare IM given by:   Date Additional Medicare IM given:   Additional Medicare IM given by:    Discharge Disposition:  HOME/SELF CARE  Per UR Regulation:  Reviewed for med. necessity/level of care/duration of stay  If discussed at [place] of Stay Meetings, dates discussed:    Comments:  04/16/14 MMcGibboney, RN, BSN Spoke with pt concerning chin strap for CPAP. Checked with [place] about chin strap.   Pt will need to go to Medical retail store to purchase. I understood, states she will get someone to go to [place] or [place] to purchase.

## 2014-04-17 ENCOUNTER — Telehealth: Payer: Self-pay | Admitting: Oncology

## 2014-04-17 NOTE — Telephone Encounter (Signed)
, °

## 2014-04-18 LAB — CULTURE, BLOOD (ROUTINE X 2)
Culture: NO GROWTH
Culture: NO GROWTH

## 2014-04-23 DIAGNOSIS — F419 Anxiety disorder, unspecified: Secondary | ICD-10-CM | POA: Diagnosis not present

## 2014-04-23 DIAGNOSIS — I10 Essential (primary) hypertension: Secondary | ICD-10-CM | POA: Diagnosis not present

## 2014-04-23 DIAGNOSIS — G4733 Obstructive sleep apnea (adult) (pediatric): Secondary | ICD-10-CM | POA: Diagnosis not present

## 2014-04-23 DIAGNOSIS — E662 Morbid (severe) obesity with alveolar hypoventilation: Secondary | ICD-10-CM | POA: Diagnosis not present

## 2014-04-23 DIAGNOSIS — E1165 Type 2 diabetes mellitus with hyperglycemia: Secondary | ICD-10-CM | POA: Diagnosis not present

## 2014-04-23 DIAGNOSIS — I5030 Unspecified diastolic (congestive) heart failure: Secondary | ICD-10-CM | POA: Diagnosis not present

## 2014-04-28 ENCOUNTER — Inpatient Hospital Stay: Payer: Medicare Other | Admitting: Pulmonary Disease

## 2014-04-29 ENCOUNTER — Telehealth: Payer: Self-pay | Admitting: Oncology

## 2014-04-29 NOTE — Telephone Encounter (Signed)
, °

## 2014-06-05 DIAGNOSIS — I890 Lymphedema, not elsewhere classified: Secondary | ICD-10-CM | POA: Diagnosis not present

## 2014-06-12 DIAGNOSIS — M179 Osteoarthritis of knee, unspecified: Secondary | ICD-10-CM | POA: Diagnosis not present

## 2014-06-12 DIAGNOSIS — E1165 Type 2 diabetes mellitus with hyperglycemia: Secondary | ICD-10-CM | POA: Diagnosis not present

## 2014-06-12 DIAGNOSIS — N183 Chronic kidney disease, stage 3 (moderate): Secondary | ICD-10-CM | POA: Diagnosis not present

## 2014-06-12 DIAGNOSIS — J301 Allergic rhinitis due to pollen: Secondary | ICD-10-CM | POA: Diagnosis not present

## 2014-06-12 DIAGNOSIS — I890 Lymphedema, not elsewhere classified: Secondary | ICD-10-CM | POA: Diagnosis not present

## 2014-06-12 DIAGNOSIS — I10 Essential (primary) hypertension: Secondary | ICD-10-CM | POA: Diagnosis not present

## 2014-06-18 ENCOUNTER — Telehealth: Payer: Self-pay | Admitting: Pulmonary Disease

## 2014-06-18 NOTE — Telephone Encounter (Signed)
Patient notified. Nothing further needed at this time.  Patient will call back to reschedule follow up once the weather gets better.

## 2014-06-18 NOTE — Telephone Encounter (Signed)
Pt states that she has lymphedema pumps and uses them two hours a day, daily.  Pt states that she recently went to hospital and had 10lbs of fluid pulled off.  Pt states that she cannot tell if the lymphedema pumps help a great deal in reducing fluid--pt states that she does notice her legs stay a little less swollen when using. Pt was advised to run this by Dr Elsworth Soho for approval to use seeing that patient has CHF and current lung issues-- could using these pumps interfere with health? Pt was advised that when using these pumps the fluid is pushed elsewhere in the body and can cause issues to heart and lungs after prolonged use. Pt is in wheelchair and is sedentary.  Pt states that she is not going to use pumps until hearing back from RA. Please advise Dr Elsworth Soho. Thanks.

## 2014-06-18 NOTE — Telephone Encounter (Signed)
OK to use pumps from a lung standpoint , if they are helping her

## 2014-06-19 ENCOUNTER — Inpatient Hospital Stay: Payer: Medicare Other | Admitting: Adult Health

## 2014-07-07 ENCOUNTER — Telehealth: Payer: Self-pay | Admitting: Oncology

## 2014-07-07 NOTE — Telephone Encounter (Signed)
, °

## 2014-09-14 ENCOUNTER — Other Ambulatory Visit: Payer: Self-pay | Admitting: Oncology

## 2014-09-14 DIAGNOSIS — C50412 Malignant neoplasm of upper-outer quadrant of left female breast: Secondary | ICD-10-CM

## 2014-09-15 ENCOUNTER — Other Ambulatory Visit: Payer: PRIVATE HEALTH INSURANCE

## 2014-09-15 ENCOUNTER — Ambulatory Visit: Payer: PRIVATE HEALTH INSURANCE | Admitting: Oncology

## 2014-09-17 DIAGNOSIS — F419 Anxiety disorder, unspecified: Secondary | ICD-10-CM | POA: Diagnosis not present

## 2014-09-17 DIAGNOSIS — E1165 Type 2 diabetes mellitus with hyperglycemia: Secondary | ICD-10-CM | POA: Diagnosis not present

## 2014-09-17 DIAGNOSIS — M179 Osteoarthritis of knee, unspecified: Secondary | ICD-10-CM | POA: Diagnosis not present

## 2014-09-17 DIAGNOSIS — I5030 Unspecified diastolic (congestive) heart failure: Secondary | ICD-10-CM | POA: Diagnosis not present

## 2014-09-17 DIAGNOSIS — J449 Chronic obstructive pulmonary disease, unspecified: Secondary | ICD-10-CM | POA: Diagnosis not present

## 2014-09-17 DIAGNOSIS — N183 Chronic kidney disease, stage 3 (moderate): Secondary | ICD-10-CM | POA: Diagnosis not present

## 2014-09-17 DIAGNOSIS — I10 Essential (primary) hypertension: Secondary | ICD-10-CM | POA: Diagnosis not present

## 2014-09-17 DIAGNOSIS — E1121 Type 2 diabetes mellitus with diabetic nephropathy: Secondary | ICD-10-CM | POA: Diagnosis not present

## 2014-09-18 ENCOUNTER — Other Ambulatory Visit (HOSPITAL_BASED_OUTPATIENT_CLINIC_OR_DEPARTMENT_OTHER): Payer: Medicare Other

## 2014-09-18 ENCOUNTER — Ambulatory Visit (HOSPITAL_BASED_OUTPATIENT_CLINIC_OR_DEPARTMENT_OTHER): Payer: Medicare Other | Admitting: Oncology

## 2014-09-18 ENCOUNTER — Telehealth: Payer: Self-pay | Admitting: Oncology

## 2014-09-18 ENCOUNTER — Encounter: Payer: Self-pay | Admitting: Oncology

## 2014-09-18 VITALS — BP 156/72 | HR 67 | Temp 97.5°F | Resp 18

## 2014-09-18 DIAGNOSIS — C50412 Malignant neoplasm of upper-outer quadrant of left female breast: Secondary | ICD-10-CM | POA: Diagnosis not present

## 2014-09-18 DIAGNOSIS — Z170 Estrogen receptor positive status [ER+]: Secondary | ICD-10-CM | POA: Diagnosis not present

## 2014-09-18 DIAGNOSIS — Z6841 Body Mass Index (BMI) 40.0 and over, adult: Secondary | ICD-10-CM

## 2014-09-18 LAB — COMPREHENSIVE METABOLIC PANEL (CC13)
ALK PHOS: 53 U/L (ref 40–150)
ALT: 10 U/L (ref 0–55)
ANION GAP: 9 meq/L (ref 3–11)
AST: 12 U/L (ref 5–34)
Albumin: 3.6 g/dL (ref 3.5–5.0)
BUN: 23.8 mg/dL (ref 7.0–26.0)
CO2: 36 meq/L — AB (ref 22–29)
Calcium: 9.2 mg/dL (ref 8.4–10.4)
Chloride: 96 mEq/L — ABNORMAL LOW (ref 98–109)
Creatinine: 0.8 mg/dL (ref 0.6–1.1)
EGFR: 65 mL/min/{1.73_m2} — ABNORMAL LOW (ref >90)
Glucose: 261 mg/dl — ABNORMAL HIGH (ref 70–140)
Potassium: 4.3 mEq/L (ref 3.5–5.1)
SODIUM: 141 meq/L (ref 136–145)
TOTAL PROTEIN: 7.1 g/dL (ref 6.4–8.3)
Total Bilirubin: 0.26 mg/dL (ref 0.20–1.20)

## 2014-09-18 LAB — CBC WITH DIFFERENTIAL/PLATELET
BASO%: 0.5 % (ref 0.0–2.0)
Basophils Absolute: 0 10*3/uL (ref 0.0–0.1)
EOS%: 2.8 % (ref 0.0–7.0)
Eosinophils Absolute: 0.2 10*3/uL (ref 0.0–0.5)
HEMATOCRIT: 39.7 % (ref 34.8–46.6)
HGB: 12.4 g/dL (ref 11.6–15.9)
LYMPH%: 24.4 % (ref 14.0–49.7)
MCH: 30.2 pg (ref 25.1–34.0)
MCHC: 31.2 g/dL — AB (ref 31.5–36.0)
MCV: 96.6 fL (ref 79.5–101.0)
MONO#: 0.6 10*3/uL (ref 0.1–0.9)
MONO%: 7.8 % (ref 0.0–14.0)
NEUT#: 5.2 10*3/uL (ref 1.5–6.5)
NEUT%: 64.5 % (ref 38.4–76.8)
PLATELETS: 177 10*3/uL (ref 145–400)
RBC: 4.11 10*6/uL (ref 3.70–5.45)
RDW: 12.6 % (ref 11.2–14.5)
WBC: 8.1 10*3/uL (ref 3.9–10.3)
lymph#: 2 10*3/uL (ref 0.9–3.3)

## 2014-09-18 NOTE — Progress Notes (Signed)
OFFICE PROGRESS NOTE   September 18, 2014   Physicians:(Kalsoom Welton Flakes), Claud Kelp, [place].Michell Heinrich, R.Duaine Dredge, R.Buccini  INTERVAL HISTORY:  Patient is seen, accompanied to office by caregiver, in scheduled follow up of T1cNx left breast cancer, on observation since lumpectomy 03-2013. She has multiple other significant comorbid problems. She is to see Dr Derrell Lolling in May and will be due mammograms at [facility] in Oct, last bilateral diagnostic 03-06-14.  Patient has no complaints that seem referable to the breast cancer history, including no noted changes in breasts bilaterally.  She is on continuous oxygen and has had more difficulty recently with lymphedema and has resumed use of lymphedema pump regularly. She is morbidly obese, diabetic, heart disease and is minimally ambulatory.  he saw PCP Dr Kevan Ny yesterday and will see gyn Dr Dion Body in June, history of episode of postmenopausal bleeding in 2014.   ONCOLOGIC HISTORY Patient was found to have left breast abnormality on screening mammograms 02-26-2013, biopsy with infiltrating ductal carcinoma ER 100%, PR negative, HER2 negative. Ki67 of 51%. She had lumpectomy without axillary node evaluation by Dr Derrell Lolling on 04-05-2013, path with 1.6 cm grade 2 invasive ductal carcinoma, margins negative. She saw Dr Welton Flakes in consultation initially, with no decision reached then about adjuvant hormonal blockade. She saw Dr Michell Heinrich, with recommendation not to attempt radiation therapy. I met her in June 2015, at which point we decided it was most appropriate to follow with observation rather than attempting hormonal blockade.      Review of systems as above, also: Environmental allergies more bothersome this spring. No new or different pain. No bleeding. Bowels moving. Breathing at baseline Remainder of 10 point Review of Systems negative.  Objective:  Vital signs in last 24 hours:  BP 156/72 mmHg  Pulse 67  Temp(Src) 97.5 F  (36.4 C) (Oral)  Resp 18  Wt   SpO2 97% Unable to stand to be weighed. Morbidly obese Alert, oriented and appropriate, talkative and very pleasant as always.. In WC on [employer] O2. Respirations not labored. Alopecia  HEENT:PERRL, sclerae not icteric. Oral mucosa moist without lesions, posterior pharynx clear.  Neck supple. No JVD.  Lymphatics:no cervical,suraclavicular, axillary adenopathy Resp: clear to auscultation bilaterally Cardio: regular rate and rhythm. No gallop. GI: obese, soft, nontender, cannot appreciate mass or organomegaly. Normally active bowel sounds.  Musculoskeletal/ Extremities: LE without  cords, tenderness Neuro: speech fluent, moves all extremities in WC. PSYCH appropriate mood and affect Skin without rash, ecchymosis, petechiae Breasts: Left lumpectomy scar well healed, otherwise bilaterally without dominant mass, skin or nipple findings. Axillae benign.   Lab Results:  Results for orders placed or performed in visit on 09/18/14  CBC with Differential  Result Value Ref Range   WBC 8.1 3.9 - 10.3 10e3/uL   NEUT# 5.2 1.5 - 6.5 10e3/uL   HGB 12.4 11.6 - 15.9 g/dL   HCT 20.4 28.9 - 35.7 %   Platelets 177 145 - 400 10e3/uL   MCV 96.6 79.5 - 101.0 fL   MCH 30.2 25.1 - 34.0 pg   MCHC 31.2 (L) 31.5 - 36.0 g/dL   RBC 6.65 2.69 - 7.19 10e6/uL   RDW 12.6 11.2 - 14.5 %   lymph# 2.0 0.9 - 3.3 10e3/uL   MONO# 0.6 0.1 - 0.9 10e3/uL   Eosinophils Absolute 0.2 0.0 - 0.5 10e3/uL   Basophils Absolute 0.0 0.0 - 0.1 10e3/uL   NEUT% 64.5 38.4 - 76.8 %   LYMPH% 24.4 14.0 - 49.7 %  MONO% 7.8 0.0 - 14.0 %   EOS% 2.8 0.0 - 7.0 %   BASO% 0.5 0.0 - 2.0 %  Comprehensive metabolic panel (Cmet) - [facility]  Result Value Ref Range   Sodium 141 136 - 145 mEq/L   Potassium 4.3 3.5 - 5.1 mEq/L   Chloride 96 (L) 98 - 109 mEq/L   CO2 36 (H) 22 - 29 mEq/L   Glucose 261 (H) 70 - 140 mg/dl   BUN 23.8 7.0 - 26.0 mg/dL   Creatinine 0.8 0.6 - 1.1 mg/dL   Total Bilirubin 0.26 0.20 - 1.20  mg/dL   Alkaline Phosphatase 53 40 - 150 U/L   AST 12 5 - 34 U/L   ALT 10 0 - 55 U/L   Total Protein 7.1 6.4 - 8.3 g/dL   Albumin 3.6 3.5 - 5.0 g/dL   Calcium 9.2 8.4 - 10.4 mg/dL   Anion Gap 9 3 - 11 mEq/L   EGFR 65 (L) >90 ml/min/1.73 m2     Studies/Results:  No results found.  Medications: I have reviewed the patient's current medications.  DISCUSSION: with multiple other physicians involved, I will see her back in year, or sooner if needed. She needs mammograms at [facility] in October.  Assessment/Plan:  1.T1Nx grade 2 invasive ductal carcinoma of left breast: ER +, PR and HER2 negative, post lumpectomy 03-2013, not candidate for radiation or hormonal blocker.Mammograms in Oct and return visit here 1 year. 2.morbid obesity with BMI >48 by last weight at this office 3.COPD, long past tobacco, sleep apnea: on home O2 and CPAP  4.DM  5.postmenopausal bleeding 2014: followed by Dr Simona Huh 6.osteoarthritis and degenerative disc disease  7.cholecystectomy, cataract extractions, previous colonoscopy by Dr Cristina Gong  8.remote bone density scan  9.lymphedema, which patient tells me is familial, maternal uncle and sister  All questions answered and patient is in agreement with plans above. Time spent 15 min including >50% counseling and coordination of care. Cc this note to Drs Inda Merlin, Dalbert Batman and Simona Huh.   Thelbert Gartin P, MD   09/18/2014, 1:40 PM

## 2014-09-18 NOTE — Telephone Encounter (Signed)
per pof to see sch in 1 yr-printed sch & adv pt will call  when sch opens-pt stated mamma already made

## 2014-09-20 DIAGNOSIS — Z6841 Body Mass Index (BMI) 40.0 and over, adult: Secondary | ICD-10-CM

## 2014-10-16 DIAGNOSIS — H179 Unspecified corneal scar and opacity: Secondary | ICD-10-CM | POA: Diagnosis not present

## 2014-10-16 DIAGNOSIS — E119 Type 2 diabetes mellitus without complications: Secondary | ICD-10-CM | POA: Diagnosis not present

## 2014-10-16 DIAGNOSIS — H3532 Exudative age-related macular degeneration: Secondary | ICD-10-CM | POA: Diagnosis not present

## 2014-10-16 DIAGNOSIS — H40013 Open angle with borderline findings, low risk, bilateral: Secondary | ICD-10-CM | POA: Diagnosis not present

## 2014-10-29 DIAGNOSIS — F419 Anxiety disorder, unspecified: Secondary | ICD-10-CM | POA: Diagnosis not present

## 2014-10-29 DIAGNOSIS — E1121 Type 2 diabetes mellitus with diabetic nephropathy: Secondary | ICD-10-CM | POA: Diagnosis not present

## 2014-10-29 DIAGNOSIS — E662 Morbid (severe) obesity with alveolar hypoventilation: Secondary | ICD-10-CM | POA: Diagnosis not present

## 2014-10-29 DIAGNOSIS — E780 Pure hypercholesterolemia: Secondary | ICD-10-CM | POA: Diagnosis not present

## 2014-10-29 DIAGNOSIS — E1165 Type 2 diabetes mellitus with hyperglycemia: Secondary | ICD-10-CM | POA: Diagnosis not present

## 2014-10-29 DIAGNOSIS — I890 Lymphedema, not elsewhere classified: Secondary | ICD-10-CM | POA: Diagnosis not present

## 2014-10-29 DIAGNOSIS — I10 Essential (primary) hypertension: Secondary | ICD-10-CM | POA: Diagnosis not present

## 2014-10-29 DIAGNOSIS — I5030 Unspecified diastolic (congestive) heart failure: Secondary | ICD-10-CM | POA: Diagnosis not present

## 2014-12-03 ENCOUNTER — Telehealth: Payer: Self-pay | Admitting: Pulmonary Disease

## 2014-12-03 NOTE — Telephone Encounter (Signed)
Made in error

## 2014-12-17 DIAGNOSIS — Z7409 Other reduced mobility: Secondary | ICD-10-CM | POA: Diagnosis not present

## 2014-12-17 DIAGNOSIS — F419 Anxiety disorder, unspecified: Secondary | ICD-10-CM | POA: Diagnosis not present

## 2014-12-17 DIAGNOSIS — Z01411 Encounter for gynecological examination (general) (routine) with abnormal findings: Secondary | ICD-10-CM | POA: Diagnosis not present

## 2014-12-24 DIAGNOSIS — E1121 Type 2 diabetes mellitus with diabetic nephropathy: Secondary | ICD-10-CM | POA: Diagnosis not present

## 2014-12-24 DIAGNOSIS — E780 Pure hypercholesterolemia: Secondary | ICD-10-CM | POA: Diagnosis not present

## 2014-12-25 DIAGNOSIS — I890 Lymphedema, not elsewhere classified: Secondary | ICD-10-CM | POA: Diagnosis not present

## 2014-12-25 DIAGNOSIS — N183 Chronic kidney disease, stage 3 (moderate): Secondary | ICD-10-CM | POA: Diagnosis not present

## 2014-12-25 DIAGNOSIS — J9612 Chronic respiratory failure with hypercapnia: Secondary | ICD-10-CM | POA: Diagnosis not present

## 2014-12-25 DIAGNOSIS — E1165 Type 2 diabetes mellitus with hyperglycemia: Secondary | ICD-10-CM | POA: Diagnosis not present

## 2014-12-25 DIAGNOSIS — E662 Morbid (severe) obesity with alveolar hypoventilation: Secondary | ICD-10-CM | POA: Diagnosis not present

## 2014-12-25 DIAGNOSIS — J449 Chronic obstructive pulmonary disease, unspecified: Secondary | ICD-10-CM | POA: Diagnosis not present

## 2014-12-25 DIAGNOSIS — M179 Osteoarthritis of knee, unspecified: Secondary | ICD-10-CM | POA: Diagnosis not present

## 2014-12-25 DIAGNOSIS — I10 Essential (primary) hypertension: Secondary | ICD-10-CM | POA: Diagnosis not present

## 2014-12-25 DIAGNOSIS — I5030 Unspecified diastolic (congestive) heart failure: Secondary | ICD-10-CM | POA: Diagnosis not present

## 2014-12-25 DIAGNOSIS — E1121 Type 2 diabetes mellitus with diabetic nephropathy: Secondary | ICD-10-CM | POA: Diagnosis not present

## 2014-12-31 ENCOUNTER — Encounter: Payer: Self-pay | Admitting: Adult Health

## 2014-12-31 ENCOUNTER — Ambulatory Visit (INDEPENDENT_AMBULATORY_CARE_PROVIDER_SITE_OTHER): Payer: Medicare Other | Admitting: Adult Health

## 2014-12-31 VITALS — BP 116/64 | HR 88 | Temp 98.0°F | Ht 63.0 in | Wt 275.0 lb

## 2014-12-31 DIAGNOSIS — R918 Other nonspecific abnormal finding of lung field: Secondary | ICD-10-CM | POA: Diagnosis not present

## 2014-12-31 DIAGNOSIS — J9611 Chronic respiratory failure with hypoxia: Secondary | ICD-10-CM

## 2014-12-31 DIAGNOSIS — J9601 Acute respiratory failure with hypoxia: Secondary | ICD-10-CM | POA: Insufficient documentation

## 2014-12-31 DIAGNOSIS — G4733 Obstructive sleep apnea (adult) (pediatric): Secondary | ICD-10-CM

## 2014-12-31 DIAGNOSIS — R222 Localized swelling, mass and lump, trunk: Secondary | ICD-10-CM

## 2014-12-31 DIAGNOSIS — E662 Morbid (severe) obesity with alveolar hypoventilation: Secondary | ICD-10-CM

## 2014-12-31 DIAGNOSIS — J948 Other specified pleural conditions: Secondary | ICD-10-CM

## 2014-12-31 NOTE — Patient Instructions (Signed)
Continue on current regimen  Wear CPAP At bedtime   DME will get download from CPAP .  Wear oxygen 2l/m rest and 3 l/m with act and At bedtime  With CPAP  Order sent for portable concentrator.  Set up CT chest follow up lung nodule.  follow up Dr. Elsworth Soho  In 6 months and As needed

## 2014-12-31 NOTE — Assessment & Plan Note (Signed)
Cont on O2 and nocturnal CPAP

## 2014-12-31 NOTE — Assessment & Plan Note (Addendum)
Compensated on nocutrnal CPAP  Need download  Plan  Continue on current regimen  Wear CPAP At bedtime   DME will get download from CPAP .  Wear oxygen 2l/m rest and 3 l/m with act and At bedtime  With CPAP  Order sent for portable concentrator.  Set up CT chest follow up lung nodule.  follow up Dr. Elsworth Soho  In 6 months and As needed

## 2014-12-31 NOTE — Progress Notes (Signed)
   Subjective:    Patient ID: Makayla Rodriguez, female    DOB: [birth year], 81 y.o.   MRN: [id number]  HPI  81 y.o MO(302 lbs) female with OSA/ OHS for FU.  She is severely limited in her ADL and cannot walk with a walker more than 10 feet.  Breast cancer 2014   Admitted on 04-2012 with mild resp acidosis & CXR s/o edema. She was aggressive diuresed -about 15L and  Placed on 24 h O2. She has OHS by ABGs with a PH of 7.4 and PCO2 of 77 and bicarb of 47.4. She has remained O2 dependent.  Ct of chest was negative for PE but did show rt diaphragm elevation and lt pleural mass.  Improved on Bipap ACE-I was dc'd & bystolic started  PSG 02/19/29 >> AHI 98.7, SpO2 low 58%. Study done with 2 liters oxygen.  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 Liver cyst/ adrenal benign nodule - unchanged two small 6 mm nodules in the right lower lobe 03/2014 Admitted hypercarbic/hypoxic Resp Failure w/ pulm edmema/benzo   12/31/2014 Follow up : OSA/OHS  Pt returns for follow up . Last seen 08/2013 .  Doing well overall .  Pt states breathing is doing good.   Wears CPAP At bedtime for around 6-7 hrs each night  Says she can not sleep without CPAP .  Doing well with nasal mask with chin strap.   Needs download, DME is going to get SD card for download.   Requests portable concentrator  On 2l/m rest and 3l/m with act and night.   Says she is utd for Prevnar and PVX.   She has pleural based lung mass on CT  Last CT chest 08/2013 showed no change in mass favored for fibrous tumor  Showed stable small pulmonary nodules .  She denies hemoptysis or unintentional wt loss.     Review of Systems neg for any significant sore throat, dysphagia, itching, sneezing, nasal congestion or excess/ purulent secretions, fever, chills, sweats, unintended wt loss,  pleuritic or exertional cp, hempoptysis, orthopnea pnd or change in chronic leg swelling. Also denies presyncope, palpitations, heartburn, abdominal pain, nausea, vomiting, diarrhea or change in bowel or urinary habits, dysuria,hematuria, rash, arthralgias, visual complaints, headache, numbness weakness or ataxia.     Objective:   Physical Exam  Gen. Pleasant, obese, in no distress, in wheelchair  ENT - no lesions, no post nasal drip, class 2-3 airway  Neck: No JVD, no thyromegaly, no carotid bruits Lungs: no use of accessory muscles, no dullness to percussion, decreased without rales or rhonchi  Cardiovascular: Rhythm regular, heart sounds  normal, no murmurs or gallops, n1+peripheral edema Musculoskeletal: No deformities, no cyanosis or clubbing , no tremors       Assessment & Plan:

## 2014-12-31 NOTE — Assessment & Plan Note (Signed)
Follow up CT chest to check pleural mass and lung nodules

## 2015-01-02 NOTE — Progress Notes (Signed)
Reviewed & agree with plan  

## 2015-01-08 DIAGNOSIS — H3531 Nonexudative age-related macular degeneration: Secondary | ICD-10-CM | POA: Diagnosis not present

## 2015-01-08 DIAGNOSIS — H35363 Drusen (degenerative) of macula, bilateral: Secondary | ICD-10-CM | POA: Diagnosis not present

## 2015-01-08 DIAGNOSIS — E119 Type 2 diabetes mellitus without complications: Secondary | ICD-10-CM | POA: Diagnosis not present

## 2015-01-14 ENCOUNTER — Encounter: Payer: Self-pay | Admitting: *Deleted

## 2015-01-14 ENCOUNTER — Encounter: Payer: Medicare Other | Attending: Family Medicine | Admitting: *Deleted

## 2015-01-14 DIAGNOSIS — E119 Type 2 diabetes mellitus without complications: Secondary | ICD-10-CM | POA: Insufficient documentation

## 2015-01-14 DIAGNOSIS — Z713 Dietary counseling and surveillance: Secondary | ICD-10-CM | POA: Insufficient documentation

## 2015-01-14 NOTE — Patient Instructions (Addendum)
Plan:  Plan:  Aim for 2 Carb Choices per meal (30 grams) +/- 1 either way  Aim for 0-15 Carbs per snack if hungry  Include protein in moderation with your meals and snacks Consider reading food labels for Total Carbohydrate and Fat Grams of foods Consider  increasing your activity level by walking around the house daily as tolerated Consider checking BG at alternate times per day as directed by MD  Continue taking medication as directed by MD  When eating grits, only 1C cooked grits per meal, 1C beans, Snack/Protein bars only look at TOTAL CARBOHYDRATES NOT [place] / Yoplait 100 Greek Yogurt  AVOID Watermelon Blueberries are OK WE MUST MEASURE ALL FOODS  Log all glucose numbers in log book. Test your glucose after any of the meals that is convenient.

## 2015-01-14 NOTE — Patient Outreach (Signed)
[facility]) Care Management  01/14/2015  Makayla Rodriguez 27-Jul-1931 [id number]   Assignment from [place] List to Quinn Plowman, RN.  Thanks, Ronnell Fresh[place]. [place], [place] Assistant Phone: [phone] Fax: [phone]

## 2015-01-14 NOTE — Progress Notes (Signed)
Diabetes Self-Management Education  Visit Type: First/Initial / 1 year follow up  Appt. Start Time: 1400 Appt. End Time: 3299  01/14/2015  [name], identified by name and date of birth, is a 81 y.o. female with a diagnosis of Diabetes: Type 2. [name] presents in her wheelchair accompanied by per personal care assistant Minna Merritts. [name] is concerned about her post dinner glucose readings. She has worked very hard to decrease her glucose readings which are lower than our last visit in 2015. Due to her renal disease she is unable to increase her medications to a potentially more effective dose. Insulin is being considered for the future. In review of her dietary intake I have made some recommendations for her and her caregivers. [name] is distressed over her lack of privacy and independence.  ASSESSMENT  There were no vitals taken for this visit. There is no weight on file to calculate BMI.      Diabetes Self-Management Education - 01/14/15 1428    Visit Information   Visit Type First/Initial   Initial Visit   Diabetes Type Type 2   Are you taking your medications as prescribed? Yes   Health Coping   How would you rate your overall health? Poor   Psychosocial Assessment   Patient Belief/Attitude about Diabetes Motivated to manage diabetes   Self-care barriers Unsteady gait/risk for falls;Other (comment)  wheel chair dependent   Self-management support Doctor's office;CDE visits  Personal Caregivers   Other persons present --  Minna Merritts, [place] Assistant   Patient Concerns Nutrition/Meal planning;Monitoring;Weight Control;Glycemic Control   Special Needs Instruct caregiver   Preferred Learning Style Auditory;Hands on   Learning Readiness Change in progress   How often do you need to have someone help you when you read instructions, pamphlets, or other written materials from your doctor or pharmacy? 5 - Always  Unable to see due to macular degeneration   Complications   How often do you check your blood sugar? 1-2 times/day   Fasting Blood glucose range (mg/dL) 70-129  81-125mg /dl   Postprandial Blood glucose range (mg/dL) 180-200;<70  67-190mg /dl   Number of hypoglycemic episodes per month 1   Are you checking your feet? Yes   How many days per week are you checking your feet? 7   Dietary Intake   Breakfast toast, egg & cheese omlet , coffee, creamer /  grits, egg, sausage link , coffee    /1/2 bagel , egg & cheese omlet, coffee   Snack (morning) watermelon and blueberries /  10 crackers with peanut butter (5 sandwiches) / klondike bar   Lunch deli meat sandwich (low sodium chicken/turkey) low fat cheese, lettuce, tomato, onion  /  hebrew national hot dog & beans / pork chop, collard greens, pinto beans/baby limas    Dinner chicken breast baked/grilled peas,   Snack (evening) protein bar   Exercise   Exercise Type ADL's;Other (comment)  Assist from personal care providers   How many days per week to you exercise? 0   How many minutes per day do you exercise? 0   Total minutes per week of exercise 0   Patient Education   Previous Diabetes Education Yes (please comment)   Nutrition management  Role of diet in the treatment of diabetes and the relationship between the three main macronutrients and blood glucose level   Physical activity and exercise  Role of exercise on diabetes management, blood pressure control and cardiac health.   Medications Reviewed patients medication for  diabetes, action, purpose, timing of dose and side effects.   Monitoring Purpose and frequency of SMBG.   Psychosocial adjustment Worked with patient to identify barriers to care and solutions;Role of stress on diabetes;Identified and addressed patients feelings and concerns about diabetes   Individualized Goals (developed by patient)   Nutrition General guidelines for healthy choices and portions discussed   Medications take my medication as prescribed    Monitoring  test my blood glucose as discussed   Reducing Risk do foot checks daily   Outcomes   Expected Outcomes Demonstrated interest in learning. Expect positive outcomes   Future DMSE PRN   Program Status Completed      Individualized Plan for Diabetes Self-Management Training:   Learning Objective:  Patient will have a greater understanding of diabetes self-management. Patient education plan is to attend individual and/or group sessions per assessed needs and concerns.   Plan:   Patient Instructions  Plan:  Aim for 2 Carb Choices per meal (30 grams) +/- 1 either way  Aim for 0-15 Carbs per snack if hungry  Include protein in moderation with your meals and snacks Consider reading food labels for Total Carbohydrate and Fat Grams of foods Consider  increasing your activity level by walking around the house daily as tolerated Consider checking BG at alternate times per day as directed by MD  Continue taking medication as directed by MD  When eating grits, only 1C cooked grits per meal, 1C beans, Snack/Protein bars only look at TOTAL CARBOHYDRATES NOT [place] / Yoplait 100 Greek Yogurt  AVOID Watermelon Blueberries are OK WE MUST MEASURE ALL FOODS  Log all glucose numbers in log book. Test your glucose after any of the meals that is convenient.   Expected Outcomes:  Demonstrated interest in learning. Expect positive outcomes  Education material provided: Living Well with Diabetes and Meal plan card  If problems or questions, patient to contact team via:  Phone  Future DSME appointment: PRN

## 2015-01-15 ENCOUNTER — Ambulatory Visit (INDEPENDENT_AMBULATORY_CARE_PROVIDER_SITE_OTHER)
Admission: RE | Admit: 2015-01-15 | Discharge: 2015-01-15 | Disposition: A | Discharge status: Home / Self Care | Payer: Medicare Other | Source: Ambulatory Visit | Attending: Adult Health | Admitting: Adult Health

## 2015-01-15 DIAGNOSIS — R918 Other nonspecific abnormal finding of lung field: Secondary | ICD-10-CM | POA: Diagnosis not present

## 2015-01-19 NOTE — Progress Notes (Signed)
Quick Note:  Called and spoke with pt. Reviewed results and recs. Pt voiced understanding and had no further questions. ______ 

## 2015-01-20 ENCOUNTER — Other Ambulatory Visit: Payer: Self-pay

## 2015-01-20 DIAGNOSIS — E118 Type 2 diabetes mellitus with unspecified complications: Secondary | ICD-10-CM

## 2015-01-20 NOTE — Patient Outreach (Signed)
[facility]) Care Management  01/20/2015  Makayla Rodriguez 1932/02/28 [id number]   SUBJECTIVE:  Telephone call to patient regarding high risk list.  Discussed and offered [place] management services to patient. Patient agreed to receive services.  Patient states she would like to have community case management follow up.  Patient states she has heart failure. Patient states she does not weigh or record weights daily.  Patient states she does not know about heart failure action plan. Patient states her last emergency room visit was November 2015 due to pulmonary issues. Patient states she wears oxygen at 2L continuously.  Patient states she also wears a CPAP at night and is compliant with use.    Patient states she need to learn how to manage her diabetes better. Patient states she knows she needs to lose weight. States it is hard for her to walk and do things that could help her lose the weight due to the swelling in her legs.  Patient states her blood sugars in the evening can run in the 200's.  Patient states her present A1c is 8.0 and she is to have a repeat done on tomorrow 01/21/15.  Patient states her concern is that her A1c has gone up.   .  Patient states she has to use the pump on her legs on occasion.    ASSESSMENT: Patient voices concern about her evening blood sugar readings and not being able to get them under control.   Per EPIC chart patient saw Ulyses Jarred, diabetic educator on 01/14/15.    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 Patient has follow up appointment scheduled with pulmonologist in 6 months.  Patient to have repeat CT as stated in pulmonologist note.  Order has been submitted for patient to have portable concentrator.      PLAN: RNCM  Will refer to community case manager for heart failure and diabetic education/managment.  Community case manager to follow up with patient regarding scheduled repeat CT scan and obtaining portable concentrator.

## 2015-01-21 DIAGNOSIS — N183 Chronic kidney disease, stage 3 (moderate): Secondary | ICD-10-CM | POA: Diagnosis not present

## 2015-01-22 NOTE — Patient Outreach (Signed)
Request from Quinn Plowman, RN to assign CM, Dannielle Huh, RN Assigned

## 2015-01-26 ENCOUNTER — Other Ambulatory Visit: Payer: Self-pay | Admitting: *Deleted

## 2015-01-26 NOTE — Patient Outreach (Signed)
[facility]) Care Management  01/26/2015  Makayla Rodriguez 1931-10-01 [id number]   Assessment: Initial outreach call- High Risk list referral Referral from telephonic care management coordinator Lorin Glass) for diabetes and heart failure management and education. Call placed to patient and spoke with her. Identity verified using two identifiers. Care management coordinator introduced self and explained purpose of the call. Patient confirmed that she is interested with the program and willing to give it a try.  Patient states "feeling pretty good" right now. She reports that she had been diagnosed with diabetes since 2006 and she is "managing it pretty well" and claims not having much problem with her diabetes. She further states that she attended diabetic clinic sessions (with Vickii Chafe.). She verbalized to care management coordinator, "I don't think I need anymore education on diabetes right now". Patient mentioned that her blood sugar is being monitored twice a day. Her morning blood sugars are running on 100's and under and her evening blood sugars are averaging on 160's as reported by patient. Per report, her A1c is 8.0. Patient reports bringing her blood sugar readings to her primary care provider's office for review.  Patient admits having heart failure and reports use of oxygen "full time" at 2 liters per minute and uses CPAP at nights. She mentions having swelling of feet, ankles, legs and stomach.  She reports being on Torsemide and taking it as ordered to help with swelling. She states she has 3 caregivers who works alternately to help her out in all her activities of daily living. She admits not weighing daily nor recording weights taken. Patient verbalized she needs to walk more and loose some weight. She identified "stubbornness" as a barrier to manage her condition.  Medications reviewed with patient and states that caregiver is helping her to manage her  medications  Patient agreed for a home visit next week. Encouraged patient to call if needed. Contact information and 24-hour nurse line number left with patient.   Plan: Initial home visit  on 02/06/2015 Will provide [facility] heart failure packet for patient.  Leeum Sankey A. Satchel Heidinger, BSN, RN-BC [place] Management Coordinator Cell: [phone]

## 2015-02-06 ENCOUNTER — Encounter: Payer: Self-pay | Admitting: *Deleted

## 2015-02-06 ENCOUNTER — Other Ambulatory Visit: Payer: Self-pay | Admitting: *Deleted

## 2015-02-06 NOTE — Patient Outreach (Signed)
[facility]) Care Management   02/06/2015  Makayla Rodriguez [birth year] [id number]  Makayla Rodriguez is an 81 y.o. female  Subjective: Patient states, "I'm doing good".  Able to "walk from bedroom to living room, living room to kitchen and back using walker", as stated by patient. Otherwise, wheeled by caregiver on wheelchair. Patient reports "using oxygen full time and CPAP at nights".  Objective: BP 118/70 mmHg  Pulse 57  Resp 18  SpO2 95%   Review of Systems  Constitutional: Negative.   HENT: Positive for hearing loss.        Slight hearing loss without hearing aide use.  Eyes:       Has macular degeneration; wears custom eyeglasses for watching TV Patient uses lighted magnifying glass  Respiratory: Positive for shortness of breath.        Breath sounds diminished to bases, otherwise clear to auscultation. Negative for wheezing. Short of breath with exertion and activities. Oxygen dependent at 2-3 liters per minute Wears CPAP at nights  Cardiovascular: Positive for leg swelling. Negative for chest pain.       Large swelling to bilateral lower extremities persistent. Patient with limited mobility related to bilateral leg swelling. Uses wheelchair due to inability to walk far distance.   Gastrointestinal:       Abdomen obese Positive bowel sounds   Genitourinary:       Patient wears briefs and pads related to urine leakage  Musculoskeletal: Negative for falls.  Skin: Negative.        Intact skin  Neurological: Negative.   Psychiatric/Behavioral: Positive for memory loss. The patient is nervous/anxious.        Noted slight memory loss Anxious at times    Physical Exam  Current Medications:   Current Outpatient Prescriptions  Medication Sig Dispense Refill  . acetaminophen (TYLENOL) 500 MG tablet Take 500 mg by mouth every 6 (six) hours as needed.    Marland Kitchen albuterol (PROVENTIL HFA;VENTOLIN HFA) 108 (90 BASE) MCG/ACT inhaler Inhale 2 puffs into the lungs every 4  (four) hours as needed for wheezing or shortness of breath. 1 Inhaler 0  . aspirin EC 81 MG tablet Take 81 mg by mouth every morning.    Marland Kitchen atorvastatin (LIPITOR) 10 MG tablet Take 10 mg by mouth daily.    Marland Kitchen azelastine (ASTELIN) 0.1 % nasal spray Place 2 sprays into both nostrils 2 (two) times daily. Use in each nostril as directed 30 mL 3  . cetirizine (ZYRTEC) 10 MG tablet Take 10 mg by mouth daily.    . cholecalciferol (VITAMIN D) 1000 UNITS tablet Take 1,000 Units by mouth every morning.    . Chromium-Cinnamon [phone] MCG-MG CAPS Take 1 tablet by mouth at bedtime.     Marland Kitchen Dextromethorphan-Guaifenesin (TUSSIN DM PO) Take 5 mLs by mouth as needed (for cough).     . diclofenac sodium (VOLTAREN) 1 % GEL Apply 4 g topically 2 (two) times daily as needed (for joint pain). For joint pain    . Dietary Management Product (TOZAL PO) Take 3 tablets by mouth daily. For eyes    . docusate sodium (COLACE) 100 MG capsule Take 100-200 mg by mouth at bedtime as needed for constipation.     . fluticasone (FLONASE) 50 MCG/ACT nasal spray Place 2 sprays into the nose 2 (two) times daily.     . hydroxypropyl methylcellulose (ISOPTO TEARS) 2.5 % ophthalmic solution Place 1 drop into both eyes 3 (three) times daily as needed (for dry eyes).    Marland Kitchen  lisinopril (PRINIVIL,ZESTRIL) 5 MG tablet Take 5 mg by mouth daily.    . nebivolol (BYSTOLIC) 5 MG tablet Take 5 mg by mouth daily.    . ONGLYZA 5 MG TABS tablet 5 mg daily.     . pantoprazole (PROTONIX) 40 MG tablet Take 40 mg by mouth every morning.    . polyethylene glycol (MIRALAX / GLYCOLAX) packet Take 17 g by mouth daily as needed (for constipation).    . sertraline (ZOLOFT) 50 MG tablet Take 25-50 mg by mouth daily. For the first 6 days take half tab. Then a full tab as of 04-17-14    . sodium chloride (OCEAN) 0.65 % SOLN nasal spray Place 1 spray into both nostrils as needed for congestion. 1 Bottle 0  . torsemide (DEMADEX) 20 MG tablet Take 20 mg by mouth daily.     . traMADol (ULTRAM) 50 MG tablet Take 50 mg by mouth 3 (three) times daily as needed (for pain.).      No current facility-administered medications for this visit.    Functional Status:   In your present state of health, do you have any difficulty performing the following activities: 01/26/2015 04/14/2014  Hearing? [name]  Vision? Y Y  Difficulty concentrating or making decisions? N Y  Walking or climbing stairs? Y Y  Dressing or bathing? Y Y  Doing errands, shopping? [name]  Preparing Food and eating ? Y -  Using the Toilet? N -  In the past six months, have you accidently leaked urine? Y -  Do you have problems with loss of bowel control? N -  Managing your Medications? Y -  Managing your Finances? Y -  Housekeeping or managing your Housekeeping? Y -    Fall/Depression Screening:    PHQ 2/9 Scores 01/26/2015 12/04/2013  PHQ - 2 Score 2 0  PHQ- 9 Score 3 -    Assessment:  Initial Home Visit Patient is a High Risk List referral. Referral from telephonic care management coordinator Lorin Glass) for diabetes and heart failure management and education.  81 year old female with multiple medical problems. Patient is morbidly obese, sitting on her recliner chair most of the time. Patient has 3 nursing assistants/ caregivers who works alternately to cater to her needs and assist with all her activities of daily living. Patient uses a walker to ambulate in short distances inside the house, otherwise, she is being wheeled using wheelchair.  Her caregivers monitor her blood pressure and blood sugar and keep records of it.  Patient reports being diagnosed with diabetes since 2006 and she is "managing it pretty well" and claims not having much problem with her diabetes. She is on oral medication Onglyza and maintaining on diabetic diet as possible. She further states that she attended diabetic education or clinic sessions recently (with Vickii Chafe.) per Dr. Inda Merlin' recommendation.  She had verbalized to care  management coordinator, "I don't think I need anymore education on diabetes right now". Patient mentioned that her blood sugar is being monitored twice a day. Her 7 day blood sugar average is 127.  Per report, her A1c is 8 with her goal to bring it below 7. Patient reports bringing her blood sugar readings to her primary care provider's office for review. Patient is seen by podiatrist at [facility]). She sees an eye doctor (Dr. Pandora Leiter) and a retinal specialist (Dr. Zadie Rhine). Eye exam was done this year per patient. She is also seen by dentist (Dr. Viviann Spare) routinely. Caregiver brings  patient to her scheduled appointments.   Ms. Gu had repeat CT scan of chest to follow-up pulmonary nodules done on 8/18 with Dr. Elsworth Soho (pulmonologist). Patient reports result was same as previous test and no changes noted.  Patient also has diagnosis of heart failure and being followed by pulmonologist. She reports use of oxygen "full time" at 2-3 liters per minute and uses CPAP at nights. She mentioned that she is still waiting for the portable concentrator to arrive and will try to call to follow it up as stated. Patient states her oxygen is set on 2 liters when at rest and 3 liters with activities and at nights. Per patient and caregiver, oxygen is service by [facility] Patient and they usually call once a week to see what is needed. Caregivers are also taught on changing tubes and humidifiers per patient report. She states having swelling of feet, ankles, legs, stomach and was also diagnosed with lymphedema. She reports taking Torsemide as ordered to help with swelling. She keeps bilateral lower extremities elevated to help reduce swelling. Patient uses lymphedema pump on her legs on occasion. Patient verbalized wanting to see a lymphedema specialist Encouraged patient to inquire from [place] office about it.  Patient admits not weighing daily nor recording weights taken-- due to difficulty standing on the  weighing scale. [facility] calendar provided. Patient and caregiver encouraged to monitor weight daily and record. Explained to patient and caregiver the importance of checking weight and proper way of taking weight. Patient verbalized she needs to walk more and loose some weight. She states, "my stubbornness" is a barrier to manage health condition. [facility] heart failure packet provided and discussed to patient and caregiver the different heart failure zone including signs and symptoms of heart failure exacerbation.   Medications reviewed with patient. She denies any problem taking and managing her medications and states that caregiver is helping her to manage medications.  Patient agreed for home visit next month. Encouraged patient to call if needed. Contact information and 24-hour nurse line number left with patient.   Plan: Patient outreach call on 03/02/15.provided  [facility] CM Care Plan Problem One        Most Recent Value   Care Plan Problem One  knowledge deficit regarding heart failure   Role Documenting the Problem One  Care Management Coordinator   Care Plan for Problem One  Active   [facility] Long Term Goal (31-90 days)  patient will verabalize at least 3 strategies of heart failure management in the next 31-45 days   [facility] Long Term Goal Start Date  01/26/15   Interventions for Problem One Long Term Goal  initial home visit made,  provided [facility] heart failure packet and encourage to read,  discuss heart failure zone tool to patient and caregiver,  encourage to take medications as ordered and discuss heart failure medications,  emphasize to patient and caregiver the importance of weighing daily and recording,  [facility] calendar provided,  discuss follow-up doctors' appointments,  encourage to monitor weight gain more than 3 pounds overnight or 5 pounds in a week,  confirm oxygen use and service   [facility] CM Short Term Goal #1 (0-30 days)  patient will verbalize the different heart failure zones and when to call the doctor  for help in the next 30 days   [facility] CM Short Term Goal #1 Start Date  01/26/15   Interventions for Short Term Goal #1  home visit made,  provided [facility] heart failure packet and discuss zone  tool with patient and caregiver,  discuss signs and symptoms of heart failure exacerbation and when to call the doctor for help     [facility] CM Short Term Goal #2 (0-30 days)  patient will verbalize and identify heart failure medications and when to take each in the next 30 days   [facility] CM Short Term Goal #2 Start Date  01/26/15   Interventions for Short Term Goal #2  initial home visit made, review medications, indications and frequency with patient and caregiver,  discuss importance and benefit of medication adherence    [facility] CM Short Term Goal #3 (0-30 days)  patient will weigh self daily and record in the next 3 weeks   [facility] CM Short Term Goal #3 Start Date  02/06/15   Interventions for Short Tern Goal #3  home visit made,  confirm that patient has a working weighing scale at home,  encourage patient and caregiver to weigh daily and record,  use [facility] calendar provided,  discuss importance of monitoring weight,  explain to patient and caregiver the proper way of checking weight,  discuss weight gain of 3 pounds in 1 day or 5 pounds in 1 week and notify doctor if any.         Lavada Langsam A. Solana Coggin, BSN, RN-BC [place] Management Coordinator Cell: [phone]

## 2015-02-09 ENCOUNTER — Encounter: Payer: Self-pay | Admitting: *Deleted

## 2015-02-25 ENCOUNTER — Other Ambulatory Visit: Payer: Self-pay | Admitting: *Deleted

## 2015-02-25 NOTE — Patient Outreach (Signed)
[facility]) Care Management  02/25/2015  Makayla Rodriguez 30-Jul-1931 [id number]   Assessment: Care Coordination Received staff message to call patient to clarify schedule for March 02, 2015. Called and spoke with patient and verified with her that care management coordinator is scheduled to do a home visit to her home on Oct.3 Monday at 10:30 am. Patient expressed understanding.  Plan: Will do home visit on Oct. 3, 2016 Will bring lymphedema information that patient was requesting for.  Lorraine A. Ajel, BSN, RN-BC [place] Management Coordinator Cell: [phone]

## 2015-03-02 ENCOUNTER — Other Ambulatory Visit: Payer: Self-pay | Admitting: *Deleted

## 2015-03-02 NOTE — Patient Outreach (Signed)
Makayla Rodriguez) Care Management   03/02/2015  Makayla Rodriguez [birth year] [id number]  Makayla Rodriguez is an 81 y.o. female  Subjective: Patient states "doing good". Reports "breathing is okay", no increased shortness of breath. Patient uses home oxygen at 2 liters per minute 24/7.  Patient reports woke up today with neck and back pain (chronic), with some relief after taking Tylenol as needed. Objective: BP 118/68 mmHg  Pulse 66  Resp 18  SpO2 96%    Review of Systems  Constitutional: Negative.   HENT: Positive for hearing loss.        Slight hard of hearing   Eyes:       Wears eyeglasses; uses magnifying lenses- visual impairment from Macular degenaration  Respiratory: Positive for shortness of breath. Negative for sputum production and wheezing.        Home Oxygen at 2 liters per minute Occasional non-productive cough  Cardiovascular: Positive for leg swelling.       Regular rate and rhythm Persistent bilateral lower extremity and abdomen swelling but non-pitting.   Gastrointestinal:       Obese Abdominal sounds present  Genitourinary: Negative.   Musculoskeletal: Positive for back pain. Negative for falls.       Chronic back pain and neck pain- takes Tylenol or Ultram as needed  Skin: Negative.   Neurological: Negative.   Endo/Heme/Allergies: Negative.     Physical Exam  Current Medications:   Current Outpatient Prescriptions  Medication Sig Dispense Refill  . acetaminophen (TYLENOL) 500 MG tablet Take 500 mg by mouth every 6 (six) hours as needed.    Marland Kitchen albuterol (PROVENTIL HFA;VENTOLIN HFA) 108 (90 BASE) MCG/ACT inhaler Inhale 2 puffs into the lungs every 4 (four) hours as needed for wheezing or shortness of breath. 1 Inhaler 0  . aspirin EC 81 MG tablet Take 81 mg by mouth every morning.    Marland Kitchen atorvastatin (LIPITOR) 10 MG tablet Take 10 mg by mouth daily.    Marland Kitchen azelastine (ASTELIN) 0.1 % nasal spray Place 2 sprays into both nostrils 2 (two) times  daily. Use in each nostril as directed 30 mL 3  . cetirizine (ZYRTEC) 10 MG tablet Take 10 mg by mouth daily.    . cholecalciferol (VITAMIN D) 1000 UNITS tablet Take 1,000 Units by mouth every morning.    . Chromium-Cinnamon [phone] MCG-MG CAPS Take 1 tablet by mouth at bedtime.     Marland Kitchen Dextromethorphan-Guaifenesin (TUSSIN DM PO) Take 5 mLs by mouth as needed (for cough).     . diclofenac sodium (VOLTAREN) 1 % GEL Apply 4 g topically 2 (two) times daily as needed (for joint pain). For joint pain    . Dietary Management Product (TOZAL PO) Take 3 tablets by mouth daily. For eyes    . docusate sodium (COLACE) 100 MG capsule Take 100-200 mg by mouth at bedtime as needed for constipation.     . fluticasone (FLONASE) 50 MCG/ACT nasal spray Place 2 sprays into the nose 2 (two) times daily.     . hydroxypropyl methylcellulose (ISOPTO TEARS) 2.5 % ophthalmic solution Place 1 drop into both eyes 3 (three) times daily as needed (for dry eyes).    Marland Kitchen lisinopril (PRINIVIL,ZESTRIL) 5 MG tablet Take 5 mg by mouth daily.    . nebivolol (BYSTOLIC) 5 MG tablet Take 5 mg by mouth daily.    . ONGLYZA 5 MG TABS tablet 5 mg daily.     . pantoprazole (PROTONIX) 40 MG tablet Take 40 mg by mouth  every morning.    . polyethylene glycol (MIRALAX / GLYCOLAX) packet Take 17 g by mouth daily as needed (for constipation).    . sertraline (ZOLOFT) 50 MG tablet Take 25-50 mg by mouth daily. For the first 6 days take half tab. Then a full tab as of 04-17-14    . sodium chloride (OCEAN) 0.65 % SOLN nasal spray Place 1 spray into both nostrils as needed for congestion. 1 Bottle 0  . torsemide (DEMADEX) 20 MG tablet Take 20 mg by mouth daily.    . traMADol (ULTRAM) 50 MG tablet Take 50 mg by mouth 3 (three) times daily as needed (for pain.).      No current facility-administered medications for this visit.    Functional Status:   In your present state of health, do you have any difficulty performing the following activities:  01/26/2015 04/14/2014  Hearing? Makayla Rodriguez  Vision? Y Y  Difficulty concentrating or making decisions? N Y  Walking or climbing stairs? Y Y  Dressing or bathing? Y Y  Doing errands, shopping? Makayla Rodriguez  Preparing Food and eating ? Y -  Using the Toilet? N -  In the past six months, have you accidently leaked urine? Y -  Do you have problems with loss of bowel control? N -  Managing your Medications? Y -  Managing your Finances? Y -  Housekeeping or managing your Housekeeping? Y -    Fall/Depression Screening:    PHQ 2/9 Scores 03/02/2015 01/26/2015 12/04/2013  PHQ - 2 Score 0 2 0  PHQ- 9 Score - 3 -    Assessment:   81 year old female who lives alone but has 3 sitters/caregivers who alternately provide care and assistance to her.  Patient informed care management coordinator that she has a "health team" comprising of her sister, brother and a sister-in law (nurse) who supports her and helps with decision-making.   Arrived to patient's house, patient and caregiver Makayla Rodriguez) are present. Identity verified. Makayla Rodriguez is sitting on her recliner with bilateral lower extremities elevated. Persistent swelling to both lower extremities, ankles and abdomen noted but non - pitting. Patient claims she has lymphedema that she possibly inherited from her mother. She has her own lymphedema pump and uses it on occasion.  Makayla Rodriguez reports it is hard for her to walk and do things because of the swelling in her legs but she tries to ambulate around the inside of her house using a rolling walker as she tolerates as part of exercise. Encourage patient to continue with ambulation and gradually increase the amount of time spent in ambulating to build endurance.   She continues to use home oxygen at 2 liters per minute and wears CPAP at nights. She denies trouble with shortness of breath. Patient reports not being able to weigh daily in spite of having 2 weighing scales since most times, she gets shaky and unsteady to stand  up.  Caregiver was able to record weight readings for 9/10= 285 pounds, 9/11= 285 pounds, 9/12= 283 pounds and 9/13= 285 pounds and patient refused the rest of the days per caregiver report. Caregiver states "If Ms. Britnie does not want to do it, then, it's not gonna happen". Blood sugar reading for today is 112. Her blood sugar average for 7 days= 127; 14 days= 138 and 30 days= 132.  Medications reviewed with patient and caregiver. Patient takes medications as instructed and caregiver continues to assist patient in managing her medications. Medications particularly for heart  failure identified by patient (Torsemide and Lisinopril).  Reviewed heart failure packet, however, patient admits she has not gone over it since the last visit. Patient was able to recall the different zones of heart failure and identified herself on the "green zone" today. Re discuss with patient regarding management of heart failure, signs and symptoms of heart failure flare-up and when to call the doctor for help. Patient was encourage to read the packet of informations and ask questions if any.  Per patient, she has a scheduled appointment with primary care provider ( Dr. Inda Merlin) on 11/2 and states will discuss lymphedema management with her. Patient's caregiver provides transport to doctors' appointment and assists her.   Print out regarding information on Lymphedema Management at [facility] was provided to patient per her request and she was very thankful of it. Notified patient of need to get a referral from primary care provider if it is appropriate for her. Patient expressed understanding.  Ms. Chriswell denies of any urgent needs or concerns at this time. She states "I got what I needed"- referring to lymphedema information. She agreed to home visit next month.  Encouraged patient to contact [facility], care management coordinator or 24-hour nurse line as necessary. Contact informations are with patient.                                                                                                                                       .           Plan: Routine home visit on 04/03/15  [facility] CM Care Plan Problem One        Most Recent Value   Care Plan Problem One  knowledge deficit regarding heart failure   Role Documenting the Problem One  Care Management Coordinator   Care Plan for Problem One  Active   THN Long Term Goal (31-90 days)  patient will verabalize at least 3 strategies of heart failure management in the next 31-45 days   THN Long Term Goal Start Date  01/26/15   Interventions for Problem One Long Term Goal  routine home visit made, review/rediscuss THN heart failure packet and encourage to read,  discuss heart failure zone tool to patient and caregiver,  encourage to take medications as ordered and discuss heart failure medications,  emphasize to patient and caregiver the importance of weighing daily and recording on [facility] calendar provided,  discuss follow-up doctors' appointments,  encourage to monitor weight gain more than 3 pounds overnight or 5 pounds in a week,  confirm oxygen use and service   THN CM Short Term Goal #1 (0-30 days)  patient will verbalize the different heart failure zones and when to call the doctor for help in the next 30 days   THN CM Short Term Goal #1 Start Date  01/26/15   [facility] CM Short Term Goal #1 Met Date  03/02/15 [facility] met]   [facility]  CM Short Term Goal #2 (0-30 days)  patient will verbalize and identify heart failure medications and when to take each in the next 30 days   THN CM Short Term Goal #2 Start Date  01/26/15   [facility] CM Short Term Goal #2 Met Date  03/02/15 [Goal met]   THN CM Short Term Goal #3 (0-30 days)  patient will weigh self daily and record in the next 3 weeks   THN CM Short Term Goal #3 Start Date  02/06/15   Interventions for Short Tern Goal #3  routine home visit made,   encourage patient and caregiver to weigh daily and record,  use [facility] calendar provided,  discuss importance  of monitoring weight,  reinforce to patient and caregiver the proper way of checking weight,  discuss weight gain of 3 pounds in 1 day or 5 pounds in 1 week and notify doctor if any.            Noralee Dutko A. Zadrian Mccauley, BSN, RN-BC [place] Management Coordinator Cell: [phone]

## 2015-03-11 ENCOUNTER — Ambulatory Visit
Admission: RE | Admit: 2015-03-11 | Discharge: 2015-03-11 | Disposition: A | Discharge status: Home / Self Care | Payer: Medicare Other | Source: Ambulatory Visit | Attending: Oncology | Admitting: Oncology

## 2015-03-11 DIAGNOSIS — R928 Other abnormal and inconclusive findings on diagnostic imaging of breast: Secondary | ICD-10-CM | POA: Diagnosis not present

## 2015-03-11 DIAGNOSIS — C50412 Malignant neoplasm of upper-outer quadrant of left female breast: Secondary | ICD-10-CM

## 2015-03-25 ENCOUNTER — Telehealth: Payer: Self-pay | Admitting: Adult Health

## 2015-03-25 NOTE — Telephone Encounter (Signed)
Called and spoke with Makayla Rodriguez from [facility] Patient she stated that order was not processed for a portable concentrator. She stated that she was going to reactive the order and verify with insurance that it will be approved and paid for by insurance. She stated that she would call the pt to inform her if insurance approved concentrator no later then tomorrow afternoon. If approved [employer] Patient will proceed with getting the POC to the pt.  Called and spoke with the pt. Notified her of the above and apologized for the confusion. She voiced understanding and had no further questions. Nothing further needed, will sign off on message.

## 2015-04-01 DIAGNOSIS — E7800 Pure hypercholesterolemia, unspecified: Secondary | ICD-10-CM | POA: Diagnosis not present

## 2015-04-01 DIAGNOSIS — E662 Morbid (severe) obesity with alveolar hypoventilation: Secondary | ICD-10-CM | POA: Diagnosis not present

## 2015-04-01 DIAGNOSIS — Z23 Encounter for immunization: Secondary | ICD-10-CM | POA: Diagnosis not present

## 2015-04-01 DIAGNOSIS — I890 Lymphedema, not elsewhere classified: Secondary | ICD-10-CM | POA: Diagnosis not present

## 2015-04-01 DIAGNOSIS — F419 Anxiety disorder, unspecified: Secondary | ICD-10-CM | POA: Diagnosis not present

## 2015-04-01 DIAGNOSIS — E1165 Type 2 diabetes mellitus with hyperglycemia: Secondary | ICD-10-CM | POA: Diagnosis not present

## 2015-04-01 DIAGNOSIS — I5030 Unspecified diastolic (congestive) heart failure: Secondary | ICD-10-CM | POA: Diagnosis not present

## 2015-04-01 DIAGNOSIS — I10 Essential (primary) hypertension: Secondary | ICD-10-CM | POA: Diagnosis not present

## 2015-04-01 DIAGNOSIS — J449 Chronic obstructive pulmonary disease, unspecified: Secondary | ICD-10-CM | POA: Diagnosis not present

## 2015-04-01 DIAGNOSIS — E1121 Type 2 diabetes mellitus with diabetic nephropathy: Secondary | ICD-10-CM | POA: Diagnosis not present

## 2015-04-03 ENCOUNTER — Other Ambulatory Visit: Payer: Self-pay | Admitting: *Deleted

## 2015-04-03 NOTE — Patient Outreach (Addendum)
[place] [facility]) Care Management   04/03/2015  Makayla Rodriguez 05-02-1932 [id number]  Makayla Rodriguez is an 81 y.o. female  Subjective: Patient reports that she had "been okay". Has dull pain to left lower back (chronic) at level 4 out of 10- on Tramadol and Voltaren cream. Patient verbalize having sinus congestion and ringing of the ear.   Objective:  BP 128/70 mmHg  Pulse 65  Resp 16  SpO2 93%  Review of Systems  HENT: Positive for congestion and hearing loss.        Reports sinus congestion with post nasal drip Ringing of the ear  Eyes:       Wears eyeglasses Use magnifying lenses History of Macular degeneration   Respiratory: Positive for cough, sputum production and shortness of breath. Negative for wheezing.        Clear to auscultation Reports occasional cough with some mucus with whitish phlegm Shortness of breath with exertion and activity Uses oxygen everyday- 2 liters on days 3 liters at nights CPAP at nights  Cardiovascular: Positive for leg swelling.       Regular rate Bilateral lower extremities with 1+ pitting edema;  abdominal swelling History of lymphedema  Gastrointestinal: Positive for constipation.       Abdomen soft, obese Bowel sounds present On Colace and Miralax  Genitourinary: Positive for frequency.  Musculoskeletal: Positive for back pain. Negative for falls.       Left lower back pain (chronic) - on Voltaren cream, Tylenol or Ultram as needed  Skin: Positive for itching.       Itching from dry skin- moisturize with lotion  Neurological: Negative.   Endo/Heme/Allergies: Negative.   Psychiatric/Behavioral: Positive for depression and memory loss. The patient is nervous/anxious.        Has history of anxiousnes Forgetful at times Reports "feeling down" at times     Physical Exam  Current Medications:   Current Outpatient Prescriptions  Medication Sig Dispense Refill  . acetaminophen (TYLENOL) 500 MG tablet Take 500 mg by  mouth every 6 (six) hours as needed.    Marland Kitchen albuterol (PROVENTIL HFA;VENTOLIN HFA) 108 (90 BASE) MCG/ACT inhaler Inhale 2 puffs into the lungs every 4 (four) hours as needed for wheezing or shortness of breath. 1 Inhaler 0  . aspirin EC 81 MG tablet Take 81 mg by mouth every morning.    Marland Kitchen atorvastatin (LIPITOR) 10 MG tablet Take 10 mg by mouth daily.    Marland Kitchen azelastine (ASTELIN) 0.1 % nasal spray Place 2 sprays into both nostrils 2 (two) times daily. Use in each nostril as directed 30 mL 3  . cetirizine (ZYRTEC) 10 MG tablet Take 10 mg by mouth daily.    . cholecalciferol (VITAMIN D) 1000 UNITS tablet Take 1,000 Units by mouth every morning.    . Chromium-Cinnamon [phone] MCG-MG CAPS Take 1 tablet by mouth at bedtime.     Marland Kitchen Dextromethorphan-Guaifenesin (TUSSIN DM PO) Take 5 mLs by mouth as needed (for cough).     . diclofenac sodium (VOLTAREN) 1 % GEL Apply 4 g topically 2 (two) times daily as needed (for joint pain). For joint pain    . Dietary Management Product (TOZAL PO) Take 3 tablets by mouth daily. For eyes    . docusate sodium (COLACE) 100 MG capsule Take 100-200 mg by mouth at bedtime as needed for constipation.     . fluticasone (FLONASE) 50 MCG/ACT nasal spray Place 2 sprays into the nose 2 (two) times daily.     Marland Kitchen  hydroxypropyl methylcellulose (ISOPTO TEARS) 2.5 % ophthalmic solution Place 1 drop into both eyes 3 (three) times daily as needed (for dry eyes).    Marland Kitchen lisinopril (PRINIVIL,ZESTRIL) 5 MG tablet Take 5 mg by mouth daily.    . nebivolol (BYSTOLIC) 5 MG tablet Take 5 mg by mouth daily.    . ONGLYZA 5 MG TABS tablet 5 mg daily.     . pantoprazole (PROTONIX) 40 MG tablet Take 40 mg by mouth every morning.    . polyethylene glycol (MIRALAX / GLYCOLAX) packet Take 17 g by mouth daily as needed (for constipation).    . sertraline (ZOLOFT) 50 MG tablet Take 25-50 mg by mouth daily. For the first 6 days take half tab. Then a full tab as of 04-17-14    . sodium chloride (OCEAN) 0.65 % SOLN  nasal spray Place 1 spray into both nostrils as needed for congestion. 1 Bottle 0  . torsemide (DEMADEX) 20 MG tablet Take 20 mg by mouth daily.    . traMADol (ULTRAM) 50 MG tablet Take 50 mg by mouth 3 (three) times daily as needed (for pain.).      No current facility-administered medications for this visit.    Functional Status:   In your present state of health, do you have any difficulty performing the following activities: 01/26/2015 04/14/2014  Hearing? [name]  Vision? Y Y  Difficulty concentrating or making decisions? N Y  Walking or climbing stairs? Y Y  Dressing or bathing? Y Y  Doing errands, shopping? [name]  Preparing Food and eating ? Y -  Using the Toilet? N -  In the past six months, have you accidently leaked urine? Y -  Do you have problems with loss of bowel control? N -  Managing your Medications? Y -  Managing your Finances? Y -  Housekeeping or managing your Housekeeping? Y -    Fall/Depression Screening:    PHQ 2/9 Scores 03/02/2015 01/26/2015 12/04/2013  PHQ - 2 Score 0 2 0  PHQ- 9 Score - 3 -    Assessment:   81 year old female who is seen for congestive heart failure, living alone with 3 alternating sitters/caregivers providing her assistance and care for her needs. Arrived at patient's house, with patient and caregiver [facility]) present during this visit.  Per usual, patient is sitting on her recliner at the living room watching television with both lower extremities elevated. Oxygen per nasal cannula in use at 2 liters per minute. Noted persistent profound swelling to both legs and abdomen. Has 1+ pitting edema to lower extremities and ankles with palpable pedal pulses.  She verbalized feeling down and frustrated because it is hard for her to walk and move with the swelling in her legs and abdomen. Encouraged patient to continue to try ambulating using rolaider from bedroom to living room, then living room to kitchen as much as she can tolerate as part of her  exercise and gradually increase once she builds endurance.   She reports shortness of breath with activities and exertion, thus, she is dependent with home oxygen as stated. Uses CPAP at nights. Patient's portable oxygen concentrator being followed- up by pulmonologist's office.  Per patient, she seldom uses her Albuterol inhaler, and recalls using it once since the last visit. Patient reports on and off coughing with some whitish phlegm that she attributes to irritation from post nasal drainage.  Ms. Curtice complains about ringing of her ear, post nasal drip and discomfort to frontal head  due to sinus congestion. She states she had seen an Eye,Ear, Nose,Throat doctor before but would prefer to see another one. Encouraged to call and set-up a schedule for one to check her about her complaints. Patient agreed.  She denies any chest discomfort/heaviness nor dizziness and her energy remains as usual per patient.   Patient reports still not being able to weigh daily as she gets shaky and unsteady to stand up to get an accurate weight. She states willingness to try weighing even just 2 times in a week.   Caregiver's recorded weight for patient last September averages at 285 pounds. Last weight recorded was taken at primary care's office on 11/2= 290 pounds.    Her blood sugar averages for 7 days= 145;  14 days= 139 and 30 days= 138. According to patient, her most recent A1c dropped to 7.8 from 8.2.  Patient reports continuing to take medications as instructed with caregiver assist in managing her medications.  Patient was able to do teach back method about heart failure zone tool, ways to manage heart failure as well as the signs and symptoms of heart failure exacerbation and when to call the doctor.  Patient reports seeing her primary care provider ( Dr. Inda Merlin) on 11/2 and discussed about lymphedema management/clinic from the print out of information provided to her. According to patient, she had requested  referral to lymphedema clinic from her primary care giver and she agreed to it.  Follow-up appointment with primary provider will be on 07/2015. Transportation to her appointments is handled by caregiver.    Ms. Woolridge denies of any other needs or concerns at this time. She agreed to a home visit next month. Encouraged patient to contact [facility], care management coordinator or 24-hour nurse line as necessary. Contact informations are with patient.    Plan: Routine home visit on 05/04/15. Follow-up referral to lymphedema clinic. Follow-up appointment with Eye,Ear,Nose, Throat doctor.      Mickel Schreur A. Mathias Bogacki, BSN, RN-BC [place] Management Coordinator Cell: [phone]

## 2015-04-04 ENCOUNTER — Encounter: Payer: Self-pay | Admitting: *Deleted

## 2015-04-06 ENCOUNTER — Other Ambulatory Visit: Payer: Self-pay | Admitting: *Deleted

## 2015-04-06 NOTE — Patient Outreach (Signed)
[place] [facility]) Care Management  04/06/2015  Makayla Rodriguez 04-01-1932 [id number]  Care Plan:   [facility] CM Care Plan Problem One        Most Recent Value   Care Plan Problem One  knowledge deficit regarding heart failure   Role Documenting the Problem One  Care Management Coordinator   Care Plan for Problem One  Active   THN Long Term Goal (31-90 days)  patient will verabalize at least 3 strategies of heart failure management in the next 31-45 days   THN Long Term Goal Start Date  01/26/15   [facility] Long Term Goal Met Date  04/03/15 [goal met]   THN CM Short Term Goal #1 (0-30 days)  patient will verbalize the different heart failure zones and when to call the doctor for help in the next 30 days   THN CM Short Term Goal #1 Start Date  01/26/15   [facility] CM Short Term Goal #1 Met Date  03/02/15 [Goal met]   THN CM Short Term Goal #2 (0-30 days)  patient will verbalize and identify heart failure medications and when to take each in the next 30 days   THN CM Short Term Goal #2 Start Date  01/26/15   [facility] CM Short Term Goal #2 Met Date  03/02/15 [Goal met]   THN CM Short Term Goal #3 (0-30 days)  patient and caregiver will monitor weight and record twice a week in the next 30 days    THN CM Short Term Goal #3 Start Date  04/03/15   Interventions for Short Tern Goal #3  encourage patient and caregiver to weigh patient at least twice a week and record,  use THN calendar provided to record results,  discuss importance of monitoring weight,  reinforce to patient and caregiver the proper way of checking weight (in the morning after using the bathroom),  emphasize weight gain of 3 pounds in 1 day or 5 pounds in 1 week monitoring and notify doctor if any      [facility] CM Care Plan Problem Two        Most Recent Value   Care Plan Problem Two  Limited mobility and ambulation related to swelling in her legs (Lymphedema)   Role Documenting the Problem Two  Care Management Coordinator   Care Plan for  Problem Two  Active   Interventions for Problem Two Long Term Goal   encourage patient to walk around the inside of the house several times a day as tolerated,  instruct to start slowly to build endurance, then gradually progress as she tolerates,  advise to do arm and leg exercises and gradually progress as tolerated,  encourage to continue use of lymphedema pump on her legs,  monitor weight and record,  follow-up referral to lymphedema clinic per primary care provider     [facility] Long Term Goal (31-90) days  patient will be able to report weight loss in the next 60 days   THN Long Term Goal Start Date  04/03/15   THN CM Short Term Goal #1 (0-30 days)  patient will be referred to lymphedema clinic in the next 30 days   THN CM Short Term Goal #1 Start Date  04/03/15   Interventions for Short Term Goal #2   provide print out of information on lymphedema management/clinic,  encourage patient to discuss with primary care provider regarding desire to be enrolled to lymphedema clinic and refer her as appropriate   [facility] CM Short Term Goal #  2 (0-30 days)  patient or caregiver will report range of motion exercises to arms and legs and ambulation inside of the house at least 5 times a day in the next 30 days    THN CM Short Term Goal #2 Start Date  04/03/15   Interventions for Short Term Goal #2  encourage patient to walk around inside of the house with rolaider several times a day as tolerated ,  advise caregiver to remind and assist patient in performing range of motion exercises to arms and legs several times a day,  praise patient for efforts exerted to motivate her to do more,  discuss the importance of starting slowly to build endurance and gradually progress,  discuss benefits of walking and exercise as vital factors in loosing weight

## 2015-04-22 ENCOUNTER — Other Ambulatory Visit: Payer: Self-pay | Admitting: *Deleted

## 2015-04-22 NOTE — Patient Outreach (Signed)
[facility]) Care Management  04/22/2015  Adrianne Maready 06/04/1931 [id number]   Assessment: Care coordination call Call placed to patient in response to message she left on my voicemail and to staff message received from care management assistant (L.Moore) about patient's call. Spoke with patient and she reported that her primary care provider's office told her that the Lymphedema management clinic with [facility] is for patients with cancer only. Told patient that care management coordinator will try to call and find out about it.  Call placed to Lymphedema management clinic and spoke to [facility] and she shared information that in order to be seen, a referral from any doctor is accepted as long as patient has diagnosis of cancer and it does not matter if it is a current diagnosis or history of. Contact informations were obtained from her.  Called patient back and gave the informations shared by Lymphedema management clinic staff to her and her caregiver [facility]). Patient states she will let the primary care provider's office know and she thanked care management coordinator for helping her out.  Plan: Will get updates from patient about the referral.  Edwena Felty A. Anterio Scheel, BSN, RN-BC [place] Management Coordinator Cell: [phone]

## 2015-05-04 ENCOUNTER — Encounter: Payer: Self-pay | Admitting: *Deleted

## 2015-05-04 ENCOUNTER — Other Ambulatory Visit: Payer: Self-pay | Admitting: *Deleted

## 2015-05-04 NOTE — Patient Outreach (Signed)
[facility]) Care Management   05/04/2015  Makayla Rodriguez June 07, 1931 [id number]  Makayla Rodriguez is an 81 y.o. female  Subjective: Patient states "been doing ok". No noted pain at this time per patient's report.  Patient express of feeling disappointed with swelling to thighs, abdomen and legs/feet.  Objective:  BP 138/72 mmHg  Pulse 64  Resp 16  SpO2 96%  Review of Systems  Constitutional: Negative.   HENT: Positive for hearing loss.        Slight hard of hearing Voice crackles at times, clears throat occasionally  Eyes:       Use of eyeglasses and magnifying lenses History of macular degeneration  Respiratory: Positive for cough and shortness of breath. Negative for wheezing.        Minimal cough non-productive; clearing of throat Shortness of breath with activity and exertion- Oxygen dependent CPAP at night  Cardiovascular: Positive for leg swelling.       Persistent profound swelling to bilateral lower extremities  Trace bilateral pedal edema  Gastrointestinal: Positive for constipation.       Abdominal edema bowel sounds present Obese, abdomen soft and non-tender  Genitourinary: Negative for frequency.  Musculoskeletal: Negative for falls.  Skin: Negative.   Neurological: Negative.   Endo/Heme/Allergies: Negative.   Psychiatric/Behavioral: Positive for memory loss. The patient is nervous/anxious.        Forgetful at times Anxious at times    Physical Exam  Current Medications:   Current Outpatient Prescriptions  Medication Sig Dispense Refill  . acetaminophen (TYLENOL) 500 MG tablet Take 500 mg by mouth every 6 (six) hours as needed.    Marland Kitchen albuterol (PROVENTIL HFA;VENTOLIN HFA) 108 (90 BASE) MCG/ACT inhaler Inhale 2 puffs into the lungs every 4 (four) hours as needed for wheezing or shortness of breath. 1 Inhaler 0  . aspirin EC 81 MG tablet Take 81 mg by mouth every morning.    Marland Kitchen atorvastatin (LIPITOR) 10 MG tablet Take 10 mg by mouth daily.     Marland Kitchen azelastine (ASTELIN) 0.1 % nasal spray Place 2 sprays into both nostrils 2 (two) times daily. Use in each nostril as directed 30 mL 3  . cetirizine (ZYRTEC) 10 MG tablet Take 10 mg by mouth daily.    . cholecalciferol (VITAMIN D) 1000 UNITS tablet Take 1,000 Units by mouth every morning.    . Chromium-Cinnamon [phone] MCG-MG CAPS Take 1 tablet by mouth at bedtime.     Marland Kitchen Dextromethorphan-Guaifenesin (TUSSIN DM PO) Take 5 mLs by mouth as needed (for cough).     . diclofenac sodium (VOLTAREN) 1 % GEL Apply 4 g topically 2 (two) times daily as needed (for joint pain). For joint pain    . Dietary Management Product (TOZAL PO) Take 3 tablets by mouth daily. For eyes    . docusate sodium (COLACE) 100 MG capsule Take 100-200 mg by mouth at bedtime as needed for constipation.     . fluticasone (FLONASE) 50 MCG/ACT nasal spray Place 2 sprays into the nose 2 (two) times daily.     . hydroxypropyl methylcellulose (ISOPTO TEARS) 2.5 % ophthalmic solution Place 1 drop into both eyes 3 (three) times daily as needed (for dry eyes).    Marland Kitchen lisinopril (PRINIVIL,ZESTRIL) 5 MG tablet Take 5 mg by mouth daily.    . nebivolol (BYSTOLIC) 5 MG tablet Take 5 mg by mouth daily.    . ONGLYZA 5 MG TABS tablet 5 mg daily.     . pantoprazole (PROTONIX) 40 MG  tablet Take 40 mg by mouth every morning.    . polyethylene glycol (MIRALAX / GLYCOLAX) packet Take 17 g by mouth daily as needed (for constipation).    . sertraline (ZOLOFT) 50 MG tablet Take 25-50 mg by mouth daily. For the first 6 days take half tab. Then a full tab as of 04-17-14    . sodium chloride (OCEAN) 0.65 % SOLN nasal spray Place 1 spray into both nostrils as needed for congestion. 1 Bottle 0  . torsemide (DEMADEX) 20 MG tablet Take 20 mg by mouth daily.    . traMADol (ULTRAM) 50 MG tablet Take 50 mg by mouth 3 (three) times daily as needed (for pain.).      No current facility-administered medications for this visit.    Functional Status:   In your  present state of health, do you have any difficulty performing the following activities: 05/04/2015 01/26/2015  Hearing? [name]  Vision? Y Y  Difficulty concentrating or making decisions? Y N  Walking or climbing stairs? Y Y  Dressing or bathing? Y Y  Doing errands, shopping? [name]  Preparing Food and eating ? Y Y  Using the Toilet? Y N  In the past six months, have you accidently leaked urine? N Y  Do you have problems with loss of bowel control? N N  Managing your Medications? Y Y  Managing your Finances? N Y  Housekeeping or managing your Housekeeping? [name]    Fall/Depression Screening:    PHQ 2/9 Scores 05/04/2015 03/02/2015 01/26/2015 12/04/2013  PHQ - 2 Score 1 0 2 0  PHQ- 9 Score - - 3 -    Assessment:   81 year old female being seen for congestive heart failure with goals met.  Arrived at patient's home, and patient noted sitting on her recliner with both legs elevated. Care giver [facility]) and patient are both present during this visit. Patient continued to express her desire for lymphedema management and referral to lymphedema clinic due to persistent profound swelling to bilateral lower extremities and abdomen which causes difficulty for her to move. Patient reports unable to talk/ contact anyone from primary care provider's office regarding referral updates and has not received a call back from primary provider to message left, per patient. Care management coordinator called [facility] Chiquita Loth) to inquire if patient can be referred back to them since she had been seen previously at their clinic. Care management coordinator was notified that [facility] no longer provide lymphedema management services at present.  Call placed to [facility] (Lymphedema clinic) to reverify that the requirements to be eligible for service, still prevails. Ms. Kalman Shan H.discussed with care management coordinator that a doctor's order and a  diagnosis/ history of cancer will allow patient for services. Message left to primary care provider's office of the need for an order/ referral for patient to lymphedema clinic.  Return call received from primary care providers office [facility]) and relayed information that was gathered from [facility] (Lymphedema) yesterday and states will take care of it.  Patient's weight had been monitored and recorded twice a week this past month except on Thanksgiving week. Weight ranged from 285 - 290 pounds.  Caregiver reports that patient had been working on her arm and leg exercises. Patient verbalized an average of 4 times a day walk around the inside of her house using her rollator walker. Encouraged patient to continue with her exercises as these are vital to her  attempts of loosing weight.   Patient was encouraged to call [place] Patient to follow-up on the small portable oxygen concentrator she was desiring to have. She denies being updated by [place] Patient regarding the status of her small portable oxygen concentrator. Patient agreed to call and get updated.  Patient denies any concerns with her heart failure and diabetes at present. She continues to monitor her condition and takes her medications as directed. Patient reports very rare use of Albuterol inhaler.  Patient is aware to call [facility], care management coordinator or 24-hour nurse line if needed. Contact numbers are with patient.    Plan: Follow-up on Lymphedema referral. Routine home visit 06/05/15   [facility] CM Care Plan Problem One        Most Recent Value   Care Plan Problem One  knowledge deficit regarding heart failure   Role Documenting the Problem One  Care Management Coordinator   Care Plan for Problem One  Not Active   [facility] Long Term Goal (31-90 days)  patient will verabalize at least 3 strategies of heart failure management in the next 31-45 days   [facility]N Long Term Goal Start Date  01/26/15   [facility] Long Term Goal Met  Date  04/03/15 [goal met]   [facility] CM Short Term Goal #1 (0-30 days)  patient will verbalize the different heart failure zones and when to call the doctor for help in the next 30 days   THN CM Short Term Goal #1 Start Date  01/26/15   [facility] CM Short Term Goal #1 Met Date  03/02/15 [Goal met]   [facility]N CM Short Term Goal #2 (0-30 days)  patient will verbalize and identify heart failure medications and when to take each in the next 30 days   THN CM Short Term Goal #2 Start Date  01/26/15   [facility] CM Short Term Goal #2 Met Date  03/02/15 [Goal met]   [facility]N CM Short Term Goal #3 (0-30 days)  patient and caregiver will monitor weight and record twice a week in the next 30 days    THN CM Short Term Goal #3 Start Date  04/03/15   [facility] CM Short Term Goal #3 Met Date  05/04/15 [Goal met]    [facility] CM Care Plan Problem Two        Most Recent Value   Care Plan Problem Two  Limited mobility and ambulation related to swelling in her legs (Lymphedema)   Role Documenting the Problem Two  Care Management [place] for Problem Two  Active   Interventions for Problem Two Long Term Goal   praise patient for efforts to walk around the inside of the house several times a day as tolerated,  explain to start slowly to build endurance, then progress gradually as she tolerates,  encourage to continue arm and leg exercises and progress as tolerated,  encourage to continue use of lymphedema pump on her legs,  monitor weight and record,  assist with referral to lymphedema clinic     THN Long Term Goal (31-90) days  patient will be able to report weight loss in the next 60 days   THN Long Term Goal Start Date  04/03/15   [facility] CM Short Term Goal #1 (0-30 days)  patient will be referred to lymphedema clinic in the next 30 days   THN CM Short Term Goal #1 Start Date  04/03/15   Interventions for Short Term Goal #2   assist patient to  call and follow-up with lymphedema clinic referral,  call placed to primary care provider to  request for order/prescription to be referred to lymphedema clinic as appropriate,  follow-up referral from primary care provider and lyphedema clinic   [facility] CM Short Term Goal #2 (0-30 days)  patient or caregiver will report range of motion exercises to arms and legs and ambulation inside of the house at least 5 times a day in the next 30 days    THN CM Short Term Goal #2 Start Date  04/03/15   Interventions for Short Term Goal #2  provide praise for patient's efforts to walk around the inside of the house with rollator several times a day as tolerated,  caregiver to remind and assist patient in performing range of motion exercises to arms and legs several times a day,  provide positive feedback for efforts exerted and emphasize benefits and importance of these exercises as vital factor in loosing weight,  remind to start slowly to build endurance and progress gradually         [place] A. Makhya Arave, BSN, RN-BC [place] Management Coordinator Cell: [phone]

## 2015-05-05 ENCOUNTER — Encounter: Payer: Self-pay | Admitting: *Deleted

## 2015-05-13 DIAGNOSIS — D1801 Hemangioma of skin and subcutaneous tissue: Secondary | ICD-10-CM | POA: Diagnosis not present

## 2015-05-13 DIAGNOSIS — L821 Other seborrheic keratosis: Secondary | ICD-10-CM | POA: Diagnosis not present

## 2015-05-13 DIAGNOSIS — L853 Xerosis cutis: Secondary | ICD-10-CM | POA: Diagnosis not present

## 2015-05-14 ENCOUNTER — Ambulatory Visit: Payer: Medicare Other | Attending: Family Medicine | Admitting: Physical Therapy

## 2015-05-14 ENCOUNTER — Ambulatory Visit: Payer: Medicare Other | Admitting: Physical Therapy

## 2015-05-14 DIAGNOSIS — I890 Lymphedema, not elsewhere classified: Secondary | ICD-10-CM | POA: Diagnosis not present

## 2015-05-14 NOTE — Therapy (Signed)
[place], [place], [address] Phone: [phone]   Fax:  [phone]  Physical Therapy Evaluation  Patient Details  Name: Makayla Rodriguez MRN: [id number] Date of Birth: Apr 25, 1932 Referring Provider: Darcus Austin   Encounter Date: 05/14/2015      PT End of Session - 05/14/15 1809    Visit Number 1   Number of Visits 25   Date for PT Re-Evaluation 07/15/15   PT Start Time [id number]   PT Stop Time 1100   PT Time Calculation (min) 45 min   Activity Tolerance Patient tolerated treatment well   Behavior During Therapy [facility] for tasks assessed/performed      Past Medical History  Diagnosis Date  . Diabetes mellitus without complication ([place])   . Hypertension   . Anxiety   . Arthritis   . Obesity   . CKD (chronic kidney disease), stage III   . Obesity hypoventilation syndrome ([place])     uses O2 at home  . Shortness of breath     with annxiety attack  . CHF (congestive heart failure) ([place])     12/13 admission  . GERD (gastroesophageal reflux disease)   . Seasonal allergies   . Complication of anesthesia     O2 sat drop during  and went into coma  . Sleep apnea     CPAP    Past Surgical History  Procedure Laterality Date  . Tonsillectomy    . Cholecystectomy    .  fybroid tumors    . Breast surgery    . Eye surgery      cataracts  . Hernia repair      umbilical  . Pilonidal cyst excision      buttock  . Partial mastectomy with needle localization Left 04/05/2013    Procedure: PARTIAL MASTECTOMY WITH NEEDLE LOCALIZATION;  Surgeon: Adin Hector, MD;  Location: [place];  Service: General;  Laterality: Left;    There were no vitals filed for this visit.  Visit Diagnosis:  Lymphedema of both lower extremities - Plan: PT plan of care cert/re-cert      Subjective Assessment - 05/14/15 1021    Subjective She wears a compression pump 2 hours a day. she does not have any type of compression  garment.    Patient is accompained by: --  caregiver, Makayla Rodriguez   Pertinent History Pt states she has been having problems with fluid retention all her life. She hhas been having problems with swelling in her legs, abdomen and even up to breasts  for several years.  She has had treatments with lasix  in the past. She  has had progressive decrease in mobilty since 2005 after a bad accident.  She has had  greadual weight gain of about 60#.  She has limited activity, but is able to walk some. History includes DM, CHF , she uses oxygen vs. nasal cannula all the time, athrtitis and pain in left knee and foot and left breast cancer.     Patient Stated Goals to find out what she can do about lymphedema in her legs    Currently in Pain? No/denies            [facility] PT Assessment - 05/14/15 0001    Assessment   Medical Diagnosis lymphedema   Referring Provider Darcus Austin    Onset Date/Surgical Date --  unknown....many years    Prior Therapy pt has been to see Meredith Leeds PT, CLT about a year  ago and has a compression pump    Precautions   Precautions Fall   Restrictions   Weight Bearing Restrictions No   Balance Screen   Has the patient fallen in the past 6 months No   Has the patient had a decrease in activity level because of a fear of falling?  Yes   Is the patient reluctant to leave their home because of a fear of falling?  Yes   [place] Private residence   Living Arrangements Alone   Available Help at Discharge Available 24 hours/day;Other (Comment)  private pay who are anxious to learn how to help pt    Type of [place] Access Level entry   Home Layout One level   [place] - 4 wheels;Shower seat - built in;Wheelchair Administrator, sports (comment)   Additional Comments oxygen 24/7 and CPAP    Prior Function   Level of Independence Requires assistive device for independence;Needs assistance with ADLs;Needs assistance with homemaking    Vocation Retired   Leisure watches TV    Cognition   Overall Cognitive Status Within Functional Limits for tasks assessed   Observation/Other Assessments   Observations pt comes in via wheelchair with nasal O2 accompanied by her personal care assistant.  She has lymphedema in lower extremties and abdomen that she says sometimes interferes with her breathing.    Skin Integrity no open areas    Other:   Other/ Comments Lymphedema LIfe Impact Scale: 42 or 62% impaired by lymphedema    Posture/Postural Control   Posture/Postural Control Postural limitations   Postural Limitations Rounded Shoulders;Forward head;Increased thoracic kyphosis   Strength   Overall Strength Comments generalized weakness at about 3-/5 level throughout.    Palpation   Palpation comment lymphostatic fibrosis on lower legs upper legs and abdomn    Bed Mobility   Supine to Sit 3: Mod assist   Supine to Sit Details (indicate cue type and reason) needs to have legs lifted onto mat, needs head of mat elevated to promote ease of breathing    Sit to Supine 3: Mod assist   Sit to Supine - Details (indicate cue type and reason) needs assist moving legs    Transfers   Transfers Sit to Stand;Stand to Sit   Sit to Stand 4: Min assist   Sit to Stand Details Manual facilitation for weight shifting   Stand to Sit 4: Min guard   Ambulation/Gait   Ambulation/Gait Yes   Ambulation/Gait Assistance 5: Supervision   Ambulation Distance (Feet) --  4   Assistive device Standard walker   Gait Pattern Decreased step length - right;Decreased step length - left;Decreased hip/knee flexion - right;Decreased hip/knee flexion - left;Decreased dorsiflexion - right;Decreased dorsiflexion - left;Right flexed knee in stance;Left flexed knee in stance;Shuffle;Antalgic;Trunk flexed;Poor foot clearance - left;Poor foot clearance - right   Ambulation Surface Indoor           LYMPHEDEMA/ONCOLOGY QUESTIONNAIRE - 05/14/15 1801    What other  symptoms do you have   Are you Having Heaviness or Tightness Yes   Are you having Pain No   Are you having pitting edema Yes   Body Site  lower legs and feet    Is it Hard or Difficult finding clothes that fit Yes   Do you have infections No   [place] Sign Yes   Lymphedema Stage   Stage STAGE 3 ELEPHANTIASIS   Right Lower Extremity Lymphedema   10  cm Proximal to Suprapatella 73.5 cm   At Midpatella/Popliteal Crease 50.9 cm  knee crease    30 cm Proximal to Floor at Lateral Plantar Foot 49 cm   20 cm Proximal to Floor at Lateral Plantar Foot 43.1 1   10  cm Proximal to Floor at Lateral Malleoli 30.8 cm   5 cm Proximal to 1st MTP Joint 25.8 cm   Around Proximal Great Toe 8.3 cm   Left Lower Extremity Lymphedema   10 cm Proximal to Suprapatella 72.5 cm   At Midpatella/Popliteal Crease 50.5 cm  at crease    30 cm Proximal to Floor at Lateral Plantar Foot 49.9 cm   20 cm Proximal to Floor at Lateral Plantar Foot 48 cm   10 cm Proximal to Floor at Lateral Malleoli 38 cm   5 cm Proximal to 1st MTP Joint 25.4 cm   Around Proximal Great Toe 8.5 cm   Other around abdomen at [employer] 155 cm                            Short Term Clinic Goals - 05/14/15 1827    CC Short Term Goal  #1   Title Patient with verbalize an understanding of lymphedema risk reduction precautions (06/14/2015)   Time 4   Period Weeks   Status New   CC Short Term Goal  #2   Title Patient will have a circumferential reduction of     3      cm at    10   cm above the   lateral border of left foot (06/14/2015)   Baseline 38   Time 4   Period Weeks   Status New   CC Short Term Goal  #3   Title Patient will have a circumferential reduction of     2      cm at  10    cm above the   lateral border of the right foot (06/14/2015)    Baseline 30.8   Time 4   Period Weeks   Status New             Long Term Clinic Goals - 05/14/15 1830    CC Long Term Goal  #1   Title Patient will have a  circumferential reduction of      6    cm at   10   cm above the     lateral border of the left foot (07/15/2015)   Baseline 38   Time 8   Period Weeks   Status New   CC Long Term Goal  #2   Title Patient will have a circumferential reduction of      4     cm at  10     cm above the   lateral border of the right foot (07/15/2015)   Baseline 30.8   Time 8   Period Weeks   Status New   CC Long Term Goal  #3   Title Patient will be independent in a home exercise program ( 07/15/2015) so she can continue to iimprove mobility at home    Time 4   Period Weeks   Status New   CC Long Term Goal  #4   Title pt will report she and caregivers are able to manage her lymphedema at home and she will have/know how to obtain the needed equipement (07/15/2015)   Time 4   Period Weeks  Status New            Plan - May 18, 2015 1810    Clinical Impression Statement Pt with multiple medical problems with longstanding lower extremity and abdominal lymphedema. She uses a lower extremity pump but feels it is making her more proximal edema worse.  She has 24 hour assist at home that is willing to learn management strategies.  Pt is motivated to try manual lymph drainage and compression wraps in the hope that she will be able to get a type of compression garmet for her legs.  Feel that her abdominal lymphedma management will be a challenge, but may respond to kinesiotaping or other attempts at gentle compression. Cardiac status may prohibit use of compression pumping to proximal legs and abdomen. She would also benefit from active exerise to promote lymphatics and activate core.    Pt will benefit from skilled therapeutic intervention in order to improve on the following deficits Increased edema;Decreased strength;Decreased mobility;Decreased knowledge of use of DME;Decreased activity tolerance   Rehab Potential Good  for goals    Clinical Impairments Affecting Rehab Potential congestive heart failure    PT  Frequency 3x / week   PT Duration 8 weeks   PT Treatment/Interventions ADLs/Self Care Home Management;Manual lymph drainage;Compression bandaging;DME Instruction;Functional mobility training;Therapeutic exercise;Therapeutic activities;Taping   PT Next Visit Plan Instruct in lymphatic flow yoga series and issue weblink. Perfrom manual lymph draiange and begin teaching caregivers. beginning core exercise in supine, compression bandgeing to left leg    Consulted and Agree with Plan of Care Patient          G-Codes - 2015-05-18 1833    Functional Limitation Self care   Self Care Current Status [phone]) At least 60 percent but less than 80 percent impaired, limited or restricted   Self Care Goal Status [id number]) At least 40 percent but less than 60 percent impaired, limited or restricted       Problem List Patient Active Problem List   Diagnosis Date Noted  . Chronic respiratory failure ([place]) 12/31/2014  . Morbid obesity with body mass index of 45.0-49.9 in adult [facility]) 09/20/2014  . Acute respiratory failure with hypoxia and hypercarbia (H[facility])   . Pulmonary edema   . Respiratory failure with hypercapnia ([place]) 04/12/2014  . Dyspnea 09/28/2013  . COPD exacerbation ([place]) 09/28/2013  . Allergic rhinitis 09/11/2013  . Cancer of upper-outer quadrant of female breast ([place]) 03/07/2013  . OSA (obstructive sleep apnea) 08/09/2012  . Obesity hypoventilation syndrome ([place]) 08/09/2012  . Pleural mass 05/15/2012  . Acute diastolic heart failure, NYHA class 2 ([place]) 05/11/2012  . Diabetes mellitus ([place]) 05/11/2012  . OA (osteoarthritis) of ankle 05/11/2012  . Hypertension 05/11/2012  . Morbid obesity ([place]) 05/11/2012   Donato Heinz. Owens Shark, PT  2015/05/18, 6:39 PM  [facility] [place], [place], [address] Phone: [phone]   Fax:  [phone]  Name: Anzal Coffey MRN: [id number] Date of Birth: 07-14-1931

## 2015-05-18 ENCOUNTER — Ambulatory Visit: Payer: Medicare Other | Admitting: Physical Therapy

## 2015-05-18 DIAGNOSIS — I890 Lymphedema, not elsewhere classified: Secondary | ICD-10-CM | POA: Diagnosis not present

## 2015-05-18 NOTE — Patient Instructions (Signed)
[email or web address] Lymphatic flow series with Shoosh Lettick Crotzer 

## 2015-05-18 NOTE — Therapy (Signed)
[place], [place], [address] Phone: [phone]   Fax:  [phone]  Physical Therapy Treatment  Patient Details  Name: Makayla Rodriguez MRN: [id number] Date of Birth: [birth year] Referring Provider: Darcus Austin   Encounter Date: 05/18/2015      PT End of Session - 05/18/15 1740    Visit Number 2   Number of Visits 25   Date for PT Re-Evaluation 07/15/15   PT Start Time 1430   PT Stop Time 1600   PT Time Calculation (min) 90 min   Activity Tolerance Patient tolerated treatment well  nasal O2 maintained    Behavior During Therapy [facility] for tasks assessed/performed      Past Medical History  Diagnosis Date  . Diabetes mellitus without complication ([place])   . Hypertension   . Anxiety   . Arthritis   . Obesity   . CKD (chronic kidney disease), stage III   . Obesity hypoventilation syndrome ([place])     uses O2 at home  . Shortness of breath     with annxiety attack  . CHF (congestive heart failure) ([place])     12/13 admission  . GERD (gastroesophageal reflux disease)   . Seasonal allergies   . Complication of anesthesia     O2 sat drop during  and went into coma  . Sleep apnea     CPAP    Past Surgical History  Procedure Laterality Date  . Tonsillectomy    . Cholecystectomy    .  fybroid tumors    . Breast surgery    . Eye surgery      cataracts  . Hernia repair      umbilical  . Pilonidal cyst excision      buttock  . Partial mastectomy with needle localization Left 04/05/2013    Procedure: PARTIAL MASTECTOMY WITH NEEDLE LOCALIZATION;  Surgeon: Adin Hector, MD;  Location: [place];  Service: General;  Laterality: Left;    There were no vitals filed for this visit.  Visit Diagnosis:  Lymphedema of both lower extremities      Subjective Assessment - 05/18/15 1451    Currently in Pain? --  discomort in her abdomen and right side and legs.                           [facility] Adult PT Treatment/Exercise - 05/18/15 0001    Exercises   Exercises Neck   Neck Exercises: Seated   Other Seated Exercise lymphatic flow series    Manual Therapy   Manual Therapy Manual Lymphatic Drainage (MLD)   Manual Lymphatic Drainage (MLD) short neck,in sitting upper back and side stimulation, to supine with legs elelvated for superficial and deep abdominals, left axillary nodes, left inuino-axillay anastamosis, left ;lateral leg and thigh , medical knee, lower leg with return along pathways, same to right side    Compression Bandaging biotone to left lower leg, then large tg soft, artiflex x 2 and 3 short stretch bandages from foot to knee    Kinesiotex Edema   Kinesiotix   Edema skin kote to left abdomen and ktape placed in fan shape from central to lateral abdomen and pt educated to remove it at any sign of discomfort                 PT Education - 05/18/15 1745    Education provided Yes   Education Details lymphatic flow neck range  of motion    Person(s) Educated Patient   Methods Explanation;Demonstration   Comprehension Verbalized understanding;Returned demonstration           Short Term Clinic Goals - 05/14/15 1827    CC Short Term Goal  #1   Title Patient with verbalize an understanding of lymphedema risk reduction precautions (06/14/2015)   Time 4   Period Weeks   Status New   CC Short Term Goal  #2   Title Patient will have a circumferential reduction of     3      cm at    10   cm above the   lateral border of left foot (06/14/2015)   Baseline 38   Time 4   Period Weeks   Status New   CC Short Term Goal  #3   Title Patient will have a circumferential reduction of     2      cm at  10    cm above the   lateral border of the right foot (06/14/2015)    Baseline 30.8   Time 4   Period Weeks   Status New             Long Term Clinic Goals - 05/14/15 1830    CC Long Term Goal  #1   Title Patient will have a circumferential reduction of       6    cm at   10   cm above the     lateral border of the left foot (07/15/2015)   Baseline 38   Time 8   Period Weeks   Status New   CC Long Term Goal  #2   Title Patient will have a circumferential reduction of      4     cm at  10     cm above the   lateral border of the right foot (07/15/2015)   Baseline 30.8   Time 8   Period Weeks   Status New   CC Long Term Goal  #3   Title Patient will be independent in a home exercise program ( 07/15/2015) so she can continue to iimprove mobility at home    Time 4   Period Weeks   Status New   CC Long Term Goal  #4   Title pt will report she and caregivers are able to manage her lymphedema at home and she will have/know how to obtain the needed equipement (07/15/2015)   Time 4   Period Weeks   Status New            Plan - 05/18/15 1450    Clinical Impression Statement pt uses O2 during treatment.  She says she has some anxiety. Pt noticed to have less edema in feet and toes (pt may have underlying lipedema) so did not wrap toes.  Pt caregiver, Delana Meyer, was attentive and learning during treatment today and will monitor kinesiontape and compression bandaging for toleratnce.    Pt will benefit from skilled therapeutic intervention in order to improve on the following deficits Increased edema;Decreased strength;Decreased mobility;Decreased knowledge of use of DME;Decreased activity tolerance   Clinical Impairments Affecting Rehab Potential congestive heart failure    PT Next Visit Plan remeasure left lower leg to assess for effectiveness of compression bandageing and ask pt effectivenss of kinesiotape. Marland Kitchen Perfrom manual lymph draiange and continue  teaching caregivers. beginning core exercise in supine, compression bandgeing to left leg    Consulted and Agree  with Plan of Care Patient        Problem List Patient Active Problem List   Diagnosis Date Noted  . Chronic respiratory failure ([place]) 12/31/2014  . Morbid obesity with body mass  index of 45.0-49.9 in adult [facility]) 09/20/2014  . Acute respiratory failure with hypoxia and hypercarbia (HCC)   . Pulmonary edema   . Respiratory failure with hypercapnia ([place]) 04/12/2014  . Dyspnea 09/28/2013  . COPD exacerbation ([place]) 09/28/2013  . Allergic rhinitis 09/11/2013  . Cancer of upper-outer quadrant of female breast ([place]) 03/07/2013  . OSA (obstructive sleep apnea) 08/09/2012  . Obesity hypoventilation syndrome ([place]) 08/09/2012  . Pleural mass 05/15/2012  . Acute diastolic heart failure, NYHA class 2 ([place]) 05/11/2012  . Diabetes mellitus ([place]) 05/11/2012  . OA (osteoarthritis) of ankle 05/11/2012  . Hypertension 05/11/2012  . Morbid obesity ([place]) 05/11/2012   Donato Heinz. Owens Shark, PT  05/18/2015, 5:46 PM  [place], [place], [address] Phone: [phone]   Fax:  [phone]  Name: Renarda Marie MRN: [id number] Date of Birth: Oct 16, 1931

## 2015-05-20 ENCOUNTER — Ambulatory Visit: Payer: Medicare Other | Admitting: Physical Therapy

## 2015-05-20 DIAGNOSIS — I890 Lymphedema, not elsewhere classified: Secondary | ICD-10-CM | POA: Diagnosis not present

## 2015-05-20 NOTE — Therapy (Signed)
[place], [place], [address] Phone: [phone]   Fax:  [phone]  Physical Therapy Treatment  Patient Details  Name: Makayla Rodriguez MRN: [id number] Date of Birth: [birth year] Referring Provider: Darcus Austin   Encounter Date: 05/20/2015      PT End of Session - 05/20/15 1621    Visit Number 3   Number of Visits 25   Date for PT Re-Evaluation 07/15/15   PT Start Time [id number]   PT Stop Time 1555   PT Time Calculation (min) 81 min   Activity Tolerance Patient tolerated treatment well   Behavior During Therapy [facility] for tasks assessed/performed      Past Medical History  Diagnosis Date  . Diabetes mellitus without complication ([place])   . Hypertension   . Anxiety   . Arthritis   . Obesity   . CKD (chronic kidney disease), stage III   . Obesity hypoventilation syndrome ([place])     uses O2 at home  . Shortness of breath     with annxiety attack  . CHF (congestive heart failure) ([place])     12/13 admission  . GERD (gastroesophageal reflux disease)   . Seasonal allergies   . Complication of anesthesia     O2 sat drop during  and went into coma  . Sleep apnea     CPAP    Past Surgical History  Procedure Laterality Date  . Tonsillectomy    . Cholecystectomy    .  fybroid tumors    . Breast surgery    . Eye surgery      cataracts  . Hernia repair      umbilical  . Pilonidal cyst excision      buttock  . Partial mastectomy with needle localization Left 04/05/2013    Procedure: PARTIAL MASTECTOMY WITH NEEDLE LOCALIZATION;  Surgeon: Adin Hector, MD;  Location: [place];  Service: General;  Laterality: Left;    There were no vitals filed for this visit.  Visit Diagnosis:  Lymphedema of both lower extremities      Subjective Assessment - 05/20/15 1438    Subjective Nothing new since last time.  At first the leg bandages were uncomfortable, but she kept them on.  It feels better, even above  the knee, like it's reduced.   Currently in Pain? Yes   Pain Score 2    Pain Location Leg   Pain Orientation Left   Pain Descriptors / Indicators Discomfort   Aggravating Factors  bandages   Pain Relieving Factors nothing               LYMPHEDEMA/ONCOLOGY QUESTIONNAIRE - 05/20/15 1452    Left Lower Extremity Lymphedema   10 cm Proximal to Suprapatella 75 cm   At Midpatella/Popliteal Crease 49.5 cm  at crease   30 cm Proximal to Floor at Lateral Plantar Foot 46.7 cm   20 cm Proximal to Floor at Lateral Plantar Foot 44.7 cm   10 cm Proximal to Floor at Lateral Malleoli 32.2 cm   5 cm Proximal to 1st MTP Joint 24.8 cm   Around Proximal Great Toe 8.2 cm                  [facility] Adult PT Treatment/Exercise - 05/20/15 0001    Self-Care   Self-Care Other Self-Care Comments   Other Self-Care Comments  Instructed personal care attendant, Aundrea, in manual lymph drainage as delineated below; instructed patient in diaphragmatic breathing.  Suggested  to patient and attendant that they would only need to do neck, abdomen, axillae, and inguino-axillary anastomoses, then let the pump do the rest.   Manual Therapy   Manual Therapy Edema management   Edema Management circumference measurements taken   Manual Lymphatic Drainage (MLD) short neck, superficial abdominals, instructed in diaphragmatic breathing, right axilla and inguino-axillary anastomosis, and right LE from dorsal foot to lateral thigh; same on left performed by personal attendant with instruction.   Compression Bandaging lotion applied to left lower leg, then bandaging with thick stockinette, Artiflex x 1, 1-8 cm., 2-10 cm., and 1-12 cm. short stretch bandage from foot to knee; last bandage was in herringbone at distal half of lower leg.   Kinesiotex Edema  no new tape applied; advised to leave tape on until it curls                PT Education - 05/20/15 1620    Education provided Yes   Education Details  manual lymph drainage for both legs including short neck, superficial abdomen, diaphragmatic breathing, axillae, inguino-axillary anastomoses and legs; patient to remove bandages in case of unrelieved pain or paresthesias when she exercises her legs   Person(s) Educated Patient;Caregiver(s)  Sherol Dade   Methods Explanation;Demonstration;Tactile cues;Verbal cues  therapist forgot to print handout today   Comprehension Verbalized understanding;Returned demonstration           Short Term Clinic Goals - 05/20/15 1624    CC Short Term Goal  #1   Title Patient with verbalize an understanding of lymphedema risk reduction precautions (06/14/2015)   Status On-going   CC Short Term Goal  #2   Title Patient will have a circumferential reduction of     3      cm at    10   cm above the   lateral border of left foot (06/14/2015)   Status Achieved   CC Short Term Goal  #3   Title Patient will have a circumferential reduction of     2      cm at  10    cm above the   lateral border of the right foot (06/14/2015)    Status On-going             Long Term Clinic Goals - 05/14/15 1830    CC Long Term Goal  #1   Title Patient will have a circumferential reduction of      6    cm at   10   cm above the     lateral border of the left foot (07/15/2015)   Baseline 38   Time 8   Period Weeks   Status New   CC Long Term Goal  #2   Title Patient will have a circumferential reduction of      4     cm at  10     cm above the   lateral border of the right foot (07/15/2015)   Baseline 30.8   Time 8   Period Weeks   Status New   CC Long Term Goal  #3   Title Patient will be independent in a home exercise program ( 07/15/2015) so she can continue to iimprove mobility at home    Time 4   Period Weeks   Status New   CC Long Term Goal  #4   Title pt will report she and caregivers are able to manage her lymphedema at home and she will have/know how to obtain  the needed equipement (07/15/2015)   Time 4   Period  Weeks   Status New            Plan - 05/20/15 1621    Clinical Impression Statement Nice circumference reductions noted in left lower leg where it was bandaged, so this was continued today.  Kinesiotape was still in place and hadn't bothered patient; she wasn't sure whether it helped.   Pt will benefit from skilled therapeutic intervention in order to improve on the following deficits Increased edema;Decreased strength;Decreased mobility;Decreased knowledge of use of DME;Decreased activity tolerance   Rehab Potential Good   Clinical Impairments Affecting Rehab Potential congestive heart failure    PT Frequency 3x / week   PT Duration 8 weeks   PT Treatment/Interventions Manual techniques;Manual lymph drainage;Patient/family education;ADLs/Self Care Home Management;Compression bandaging   PT Next Visit Plan Please print instructions for self-manual lymph drainage, suggesting that the neck, abdomen, and trunk be done manually and then to use the pump on the legs.  Continue complete decongestive therapy.   PT Home Exercise Plan AROM of ankle joint and toes.   Consulted and Agree with Plan of Care Patient        Problem List Patient Active Problem List   Diagnosis Date Noted  . Chronic respiratory failure ([place]) 12/31/2014  . Morbid obesity with body mass index of 45.0-49.9 in adult [facility]) 09/20/2014  . Acute respiratory failure with hypoxia and hypercarbia (HCC)   . Pulmonary edema   . Respiratory failure with hypercapnia ([place]) 04/12/2014  . Dyspnea 09/28/2013  . COPD exacerbation ([place]) 09/28/2013  . Allergic rhinitis 09/11/2013  . Cancer of upper-outer quadrant of female breast ([place]) 03/07/2013  . OSA (obstructive sleep apnea) 08/09/2012  . Obesity hypoventilation syndrome ([place]) 08/09/2012  . Pleural mass 05/15/2012  . Acute diastolic heart failure, NYHA class 2 ([place]) 05/11/2012  . Diabetes mellitus ([place]) 05/11/2012  . OA (osteoarthritis) of ankle 05/11/2012  . Hypertension  05/11/2012  . Morbid obesity ([place]) 05/11/2012    SALISBURY,DONNA 05/20/2015, 4:26 PM  [place], [place], [address] Phone: [phone]   Fax:  [phone]  Name: Makayla Rodriguez MRN: [id number] Date of Birth: 1932-01-09    Serafina Royals, PT 05/20/2015 4:26 PM

## 2015-05-27 ENCOUNTER — Ambulatory Visit: Payer: Medicare Other

## 2015-05-27 DIAGNOSIS — I890 Lymphedema, not elsewhere classified: Secondary | ICD-10-CM

## 2015-05-27 NOTE — Therapy (Signed)
[place], [place], [address] Phone: [phone]   Fax:  [phone]  Physical Therapy Treatment  Patient Details  Name: Makayla Rodriguez MRN: [id number] Date of Birth: 11/02/1931 Referring Provider: Darcus Austin   Encounter Date: 05/27/2015      PT End of Session - 05/27/15 1655    Visit Number 4   Number of Visits 25   Date for PT Re-Evaluation 07/15/15   PT Start Time [id number]   PT Stop Time 1643   PT Time Calculation (min) 80 min   Activity Tolerance Patient tolerated treatment well   Behavior During Therapy [facility] for tasks assessed/performed      Past Medical History  Diagnosis Date  . Diabetes mellitus without complication ([place])   . Hypertension   . Anxiety   . Arthritis   . Obesity   . CKD (chronic kidney disease), stage III   . Obesity hypoventilation syndrome ([place])     uses O2 at home  . Shortness of breath     with annxiety attack  . CHF (congestive heart failure) ([place])     12/13 admission  . GERD (gastroesophageal reflux disease)   . Seasonal allergies   . Complication of anesthesia     O2 sat drop during  and went into coma  . Sleep apnea     CPAP    Past Surgical History  Procedure Laterality Date  . Tonsillectomy    . Cholecystectomy    .  fybroid tumors    . Breast surgery    . Eye surgery      cataracts  . Hernia repair      umbilical  . Pilonidal cyst excision      buttock  . Partial mastectomy with needle localization Left 04/05/2013    Procedure: PARTIAL MASTECTOMY WITH NEEDLE LOCALIZATION;  Surgeon: Adin Hector, MD;  Location: [facility];  Service: General;  Laterality: Left;    There were no vitals filed for this visit.  Visit Diagnosis:  Lymphedema of both lower extremities      Subjective Assessment - 05/27/15 1540    Subjective The last bandages stayed on for about 2 days. They felt so tight, I had to take it off.    Currently in Pain? Yes   Pain Score  2    Pain Location Foot   Pain Orientation Left   Pain Descriptors / Indicators Dull   Pain Type Chronic pain   Pain Onset More than a month ago   Pain Frequency Intermittent   Aggravating Factors  standing on it   Pain Relieving Factors resting                         [facility] Adult PT Treatment/Exercise - 05/27/15 0001    Manual Therapy   Manual Lymphatic Drainage (MLD) short neck, superficial abdominals, instructed in diaphragmatic breathing, right axilla and inguino-axillary anastomosis, and right LE from dorsal foot to lateral thigh; same on left performed by personal attendant with instruction.   Compression Bandaging lotion applied to left lower leg, then bandaging with thick stockinette, Elastomull to first 3 toes and layered extra over anterior ankle where pt c/o most tightness with last bandage, Artiflex x 1, 1-8 cm., 2-10 cm., and 1-12 cm. short stretch bandage from foot to knee                PT Education - 05/27/15 1650    Education  provided Yes   Education Details Explained in more detail what lymphedema is and how our treatment helps answering pts questions throughout, also issued lymphedema pamphlet and self manual lymph drainage handout while reviewing throughout session today while performing and reviewing importance of pt performing neck, abdomen, and trunk portions before she wears her pump each time to give the fluid an outlet out of her Lt lower quadrant.    Person(s) Educated Patient;Caregiver(s)  Sherol Dade   Methods Explanation;Handout;Verbal cues   Comprehension Verbalized understanding;Need further instruction  Review self manual lymph drainage with pt and caregiver           Short Term Clinic Goals - 05/20/15 1624    CC Short Term Goal  #1   Title Patient with verbalize an understanding of lymphedema risk reduction precautions (06/14/2015)   Status On-going   CC Short Term Goal  #2   Title Patient will have a circumferential reduction  of     3      cm at    10   cm above the   lateral border of left foot (06/14/2015)   Status Achieved   CC Short Term Goal  #3   Title Patient will have a circumferential reduction of     2      cm at  10    cm above the   lateral border of the right foot (06/14/2015)    Status On-going             Long Term Clinic Goals - 05/14/15 1830    CC Long Term Goal  #1   Title Patient will have a circumferential reduction of      6    cm at   10   cm above the     lateral border of the left foot (07/15/2015)   Baseline 38   Time 8   Period Weeks   Status New   CC Long Term Goal  #2   Title Patient will have a circumferential reduction of      4     cm at  10     cm above the   lateral border of the right foot (07/15/2015)   Baseline 30.8   Time 8   Period Weeks   Status New   CC Long Term Goal  #3   Title Patient will be independent in a home exercise program ( 07/15/2015) so she can continue to iimprove mobility at home    Time 4   Period Weeks   Status New   CC Long Term Goal  #4   Title pt will report she and caregivers are able to manage her lymphedema at home and she will have/know how to obtain the needed equipement (07/15/2015)   Time 4   Period Weeks   Status New            Plan - 05/27/15 1656    Clinical Impression Statement Did not measure today as pt has had bandages off for >3 days. Her skin however looks well with no areas of redness. Did not do herring bone pattern with last bandage today as pt reported it helped reduce her leg well but was very uncomfortable at her anterior ankle so layered Elastomull here under Artiflex.   Pt will benefit from skilled therapeutic intervention in order to improve on the following deficits Increased edema;Decreased strength;Decreased mobility;Decreased knowledge of use of DME;Decreased activity tolerance   Rehab Potential Good   Clinical  Impairments Affecting Rehab Potential congestive heart failure    PT Frequency 3x / week   PT  Duration 8 weeks   PT Treatment/Interventions Manual techniques;Manual lymph drainage;Patient/family education;ADLs/Self Care Home Management;Compression bandaging   PT Next Visit Plan Continue to review importance of suggesting that the neck, abdomen, and trunk be done manually prior to use of the pump on the legs.  Continue complete decongestive therapy (wrapping Lt LE only)   Consulted and Agree with Plan of Care Patient        Problem List Patient Active Problem List   Diagnosis Date Noted  . Chronic respiratory failure ([place]) 12/31/2014  . Morbid obesity with body mass index of 45.0-49.9 in adult [facility]) 09/20/2014  . Acute respiratory failure with hypoxia and hypercarbia (HCC)   . Pulmonary edema   . Respiratory failure with hypercapnia ([place]) 04/12/2014  . Dyspnea 09/28/2013  . COPD exacerbation ([place]) 09/28/2013  . Allergic rhinitis 09/11/2013  . Cancer of upper-outer quadrant of female breast ([place]) 03/07/2013  . OSA (obstructive sleep apnea) 08/09/2012  . Obesity hypoventilation syndrome ([place]) 08/09/2012  . Pleural mass 05/15/2012  . Acute diastolic heart failure, NYHA class 2 ([place]) 05/11/2012  . Diabetes mellitus ([place]) 05/11/2012  . OA (osteoarthritis) of ankle 05/11/2012  . Hypertension 05/11/2012  . Morbid obesity ([place]) 05/11/2012    Otelia Limes, PTA 05/27/2015, 5:01 PM  [place], [place], [address] Phone: [phone]   Fax:  [phone]  Name: Makayla Rodriguez MRN: [id number] Date of Birth: 1932-01-18

## 2015-05-27 NOTE — Patient Instructions (Signed)
Deep Effective Breath   Standing, sitting, or laying down place both hands on the belly. Take a deep breath IN, expanding the belly; then breath OUT, contracting the belly. Repeat __5__ times. Do __2-3__ sessions per day and before each self massage.  http://gt2.exer.us/866   Copyright  VHI. All rights reserved.  Inguinal Nodes to Axilla - Clear   On involved side, at armpit, make _5__ in-place circles. Then from hip proceed in sections to armpit with stationary circles or pumps _5_ times, this is your pathway. Do _1__ time per day.  Copyright  VHI. All rights reserved.  LEG: Knee to Hip - Clear   Pump up outer thigh of involved leg from knee to outer hip. Then do stationary circles from inner to outer thigh, then do outer thigh again. Next, interlace fingers behind knee IF ABLE and make in-place circles. Do _5_ times of each sequence.  Do _1__ time per day.  Copyright  VHI. All rights reserved.  LEG: Ankle to Hip Sweep   Hands on sides of ankle of involved leg, pump _5__ times up both sides of lower leg, then retrace steps up outer thigh to hip as before and back to pathway. Do _2-3_ times. Do __1_ time per day.  Copyright  VHI. All rights reserved.  FOOT: Dorsum of Foot and Toes Massage   One hand on top of foot make _5_ stationary circles or pumps, then either on top of toes or each individual toe do _5_ pumps. Then retrace all steps pumping back up both sides of lower leg, outer thigh, and then pathway. Finish with what you started with, _5_ circles at involved side arm pit. All _2-3_ times at each sequence. Do _1__ time per day.  Copyright  VHI. All rights reserved.    

## 2015-06-02 ENCOUNTER — Ambulatory Visit: Payer: Medicare Other | Admitting: Physical Therapy

## 2015-06-03 ENCOUNTER — Ambulatory Visit: Payer: Medicare Other

## 2015-06-04 ENCOUNTER — Ambulatory Visit: Payer: Medicare Other | Attending: Family Medicine | Admitting: Physical Therapy

## 2015-06-04 DIAGNOSIS — I890 Lymphedema, not elsewhere classified: Secondary | ICD-10-CM | POA: Diagnosis not present

## 2015-06-05 ENCOUNTER — Ambulatory Visit: Payer: Self-pay | Admitting: *Deleted

## 2015-06-05 NOTE — Therapy (Signed)
[place], [place], [address] Phone: [phone]   Fax:  [phone]  Physical Therapy Treatment  Patient Details  Name: Makayla Rodriguez MRN: [id number] Date of Birth: 1932-01-31 Referring Provider: Darcus Austin   Encounter Date: 06/04/2015      PT End of Session - 06/05/15 0915    Visit Number 5   Number of Visits 25   Date for PT Re-Evaluation 07/15/15   PT Start Time 1430   PT Stop Time 1545   PT Time Calculation (min) 75 min   Activity Tolerance Patient tolerated treatment well   Behavior During Therapy [facility] for tasks assessed/performed      Past Medical History  Diagnosis Date  . Diabetes mellitus without complication ([place])   . Hypertension   . Anxiety   . Arthritis   . Obesity   . CKD (chronic kidney disease), stage III   . Obesity hypoventilation syndrome ([place])     uses O2 at home  . Shortness of breath     with annxiety attack  . CHF (congestive heart failure) ([place])     12/13 admission  . GERD (gastroesophageal reflux disease)   . Seasonal allergies   . Complication of anesthesia     O2 sat drop during  and went into coma  . Sleep apnea     CPAP    Past Surgical History  Procedure Laterality Date  . Tonsillectomy    . Cholecystectomy    .  fybroid tumors    . Breast surgery    . Eye surgery      cataracts  . Hernia repair      umbilical  . Pilonidal cyst excision      buttock  . Partial mastectomy with needle localization Left 04/05/2013    Procedure: PARTIAL MASTECTOMY WITH NEEDLE LOCALIZATION;  Surgeon: Adin Hector, MD;  Location: [facility];  Service: General;  Laterality: Left;    There were no vitals filed for this visit.  Visit Diagnosis:  Lymphedema of both lower extremities      Subjective Assessment - 06/04/15 1458    Subjective Pt is concerned about swelling in her abdomen.  She says her swelling goes down in the leg after she takes the bandages off, but  then it comes back.  She says she and caregivers have not found a good postition to do manual lymph drainage and so they have not been doing it. She continues to use the pumps. She wants to get some type of garment fo her legsbut does not think it is a good use of time to keep bandaging her legs now as the fluid just returns and they are not able to keep it up at home    Pertinent History Pt states she has been having problems with fluid retention all her life. She hhas been having problems with swelling in her legs, abdomen and even up to breasts  for several years.  She has had treatments with lasix  in the past. She  has had progressive decrease in mobilty since 2005 after a bad accident.  She has had  greadual weight gain of about 60#.  She has limited activity, but is able to walk some. History includes DM, CHF , she uses oxygen vs. nasal cannula all the time, athrtitis and pain in left knee and foot and left breast cancer.     Patient Stated Goals to find out what she can do about lymphedema in  her legs    Currently in Pain? Yes   Pain Score 2    Pain Location Foot   Pain Orientation Left   Pain Descriptors / Indicators Dull                         [facility] Adult PT Treatment/Exercise - 06/05/15 0001    Ambulation/Gait   Ambulation/Gait Yes   Ambulation/Gait Assistance 5: Supervision   Ambulation/Gait Assistance Details frequent verbal cues for upright posture    Ambulation Distance (Feet) --  50   Assistive device Standard walker   Gait Pattern Decreased step length - right;Decreased step length - left;Decreased hip/knee flexion - right;Decreased hip/knee flexion - left;Decreased dorsiflexion - right;Decreased dorsiflexion - left;Right flexed knee in stance;Left flexed knee in stance;Shuffle;Antalgic;Trunk flexed;Poor foot clearance - left;Poor foot clearance - right   Ambulation Surface Indoor   Self-Care   Self-Care Other Self-Care Comments   Other Self-Care Comments  pt  will use an egg crate foam piece that she has at home to put between her abdomen and the chair that she sits in to try to mobilize the left sided abdomina fluid She will continue to do self diaphragmatic breathing and axillary circles throughout the day and especially while she is on the pump. long discussion about compression garments and long term managment. she agrees she needs to be more active.and wants to get some type of garments for lower legs.  showed pt and caregiver 8inch golds gym bands for pt to use at abdomen (she will need 2) to try for some mild compression as long as she does not have increased breathing problems. pt agreed.    Lumbar Exercises: Standing   Other Standing Lumbar Exercises standing for 2 minutes x 4 reps with attention to glute and core activation with mild weight shift    Lumbar Exercises: Seated   Other Seated Lumbar Exercises diphragmatic breathing with verbal and tactile cues, pt needs repeated practice    Knee/Hip Exercises: Seated   Marching AROM;Both;4 sets;Other (comment)  30 sec sets   Shoulder Exercises: Seated   Other Seated Exercises bilateral UE hip to hip with effort to sit tall 30 second increments x 4  with rest in between                 PT Education - 06/05/15 0904    Education provided Yes   Education Details Home exercise    Person(s) Educated Patient;Caregiver(s)  Sherol Dade    Methods Explanation;Other (comment)  caregiver took video of me/pt doing exercise    Comprehension Verbalized understanding;Returned demonstration           Short Term Clinic Goals - 05/20/15 1624    CC Short Term Goal  #1   Title Patient with verbalize an understanding of lymphedema risk reduction precautions (06/14/2015)   Status On-going   CC Short Term Goal  #2   Title Patient will have a circumferential reduction of     3      cm at    10   cm above the   lateral border of left foot (06/14/2015)   Status Achieved   CC Short Term Goal  #3   Title  Patient will have a circumferential reduction of     2      cm at  10    cm above the   lateral border of the right foot (06/14/2015)    Status On-going  [facility] Goals - 05/14/15 1830    CC Long Term Goal  #1   Title Patient will have a circumferential reduction of      6    cm at   10   cm above the     lateral border of the left foot (07/15/2015)   Baseline 38   Time 8   Period Weeks   Status New   CC Long Term Goal  #2   Title Patient will have a circumferential reduction of      4     cm at  10     cm above the   lateral border of the right foot (07/15/2015)   Baseline 30.8   Time 8   Period Weeks   Status New   CC Long Term Goal  #3   Title Patient will be independent in a home exercise program ( 07/15/2015) so she can continue to iimprove mobility at home    Time 4   Period Weeks   Status New   CC Long Term Goal  #4   Title pt will report she and caregivers are able to manage her lymphedema at home and she will have/know how to obtain the needed equipement (07/15/2015)   Time 4   Period Weeks   Status New            Plan - 06/05/15 0916    Clinical Impression Statement Pt comes in without bandages and reports that she has not been able to follow up much at home other than using the pumps.  Extra time spent on diphragmatic breathing and axillary node stimulation that pt can do indpendently.  Aslo focused on exercise that she can do independenty: arm movement, marching and standing in times increments.  Suggested she do them for a 30 second commercial break on TV to make it easy.  She agrees and was able to do 4 cycles of exercise with breaks in between and also increase walking distance. She wants to meet with Rexford Maus to get garments for home.  Will try to set up a meeting when PT is available also to maximize compliance    Clinical Impairments Affecting Rehab Potential congestive heart failure    PT Next Visit Plan Continue to review  importance of suggesting that the neck, abdomen, and trunk be done manually prior to use of the pump on the legs.  Review and Reinforce exercise and gait with focus on Pt independence and intiation.  consider exercise log. set up a meeting with Rexford Maus to move forward with compression alternative for Lower leg to move toward pt ultimate goal of getting compressin stockings,    Consulted and Agree with Plan of Care Patient;Family member/caregiver   Family Member Consulted aundrea        Problem List Patient Active Problem List   Diagnosis Date Noted  . Chronic respiratory failure ([place]) 12/31/2014  . Morbid obesity with body mass index of 45.0-49.9 in adult [facility]) 09/20/2014  . Acute respiratory failure with hypoxia and hypercarbia ([facility])   . Pulmonary edema   . Respiratory failure with hypercapnia ([place]) 04/12/2014  . Dyspnea 09/28/2013  . COPD exacerbation ([place]) 09/28/2013  . Allergic rhinitis 09/11/2013  . Cancer of upper-outer quadrant of female breast ([place]) 03/07/2013  . OSA (obstructive sleep apnea) 08/09/2012  . Obesity hypoventilation syndrome ([place]) 08/09/2012  . Pleural mass 05/15/2012  . Acute diastolic heart failure, NYHA class 2 ([place]) 05/11/2012  . Diabetes  mellitus ([place]) 05/11/2012  . OA (osteoarthritis) of ankle 05/11/2012  . Hypertension 05/11/2012  . Morbid obesity ([place]) 05/11/2012   Donato Heinz. Owens Shark, PT  06/05/2015, 9:24 AM  [place], [place], [address] Phone: [phone]   Fax:  [phone]  Name: Makayla Rodriguez MRN: [id number] Date of Birth: March 07, 1932

## 2015-06-08 ENCOUNTER — Other Ambulatory Visit: Payer: Self-pay | Admitting: *Deleted

## 2015-06-08 NOTE — Patient Outreach (Addendum)
[facility] Care Management  06/08/2015  Makayla Rodriguez 05-18-1932 [id number]   Assessment: Care coordination Call placed to reschedule appointment made for 06/05/15 for a routine home visit but unable to reach patient on both phone numbers provided. HIPAA compliant voice message left with name and contact information.  Plan: Will await for a return call to reschedule routine home visit. If unable to receive a call back, will reschedule for next outreach call.  Jorryn Hershberger A. Donovyn Guidice, BSN, RN-BC [place] Management Coordinator Cell: [phone]

## 2015-06-09 ENCOUNTER — Ambulatory Visit: Payer: Medicare Other | Admitting: Physical Therapy

## 2015-06-09 ENCOUNTER — Other Ambulatory Visit: Payer: Self-pay | Admitting: *Deleted

## 2015-06-09 NOTE — Patient Outreach (Signed)
[facility]) Care Management  06/09/2015  Makayla Rodriguez [birth year] [id number]   Assessment: Care coordination Unable to receive a call back form patient to messages left on her voicemail yesterday. Call placed and spoke with patient to reschedule routine home visit appointment on 06/05/15. Patient agreed to reschedule home visit this Friday.   Plan: Routine home visit reschedule on 06/12/15.  Maebelle Sulton A. Leiloni Smithers, BSN, RN-BC [place] Management Coordinator Cell: [phone]

## 2015-06-11 ENCOUNTER — Ambulatory Visit: Payer: Medicare Other | Admitting: Physical Therapy

## 2015-06-12 ENCOUNTER — Other Ambulatory Visit: Payer: Self-pay | Admitting: *Deleted

## 2015-06-12 ENCOUNTER — Encounter: Payer: Self-pay | Admitting: *Deleted

## 2015-06-12 NOTE — Patient Outreach (Signed)
[facility]) Care Management   06/12/2015  Makayla Rodriguez [birth year] [id number]  Lashante Fryberger is an 82 y.o. female  Subjective: Makayla Rodriguez verbalized 'feeling better". Identifies herself on the "green zone today".  She reports that her "breathing is better" than the last couple of days. Denies any pain or discomfort at this time.  Objective: BP 122/74 mmHg  Pulse 65  Resp 14  SpO2 93%   Review of Systems  Constitutional: Negative.   HENT: Positive for hearing loss and tinnitus.        No hearing aid use  Eyes:       Wears custom eyeglasses Uses magnifying glass with light Visual impairment related to macular degeneration  Respiratory: Negative for sputum production and wheezing.        Use home oxygen 24/7 at 2-3 liters per minute Lung sounds slightly diminished to left side otherwise clear Uses CPAP at night Shortness of breath with physical exertion and walking for a distance per patient   Cardiovascular: Positive for leg swelling. Negative for chest pain.       Regular rate and rhythm Profound swelling to bilateral lower extremities and abdomen, non-pitting   Gastrointestinal: Positive for constipation.       Abdomen obese, distended,soft, non-tender Bowel sounds present  Genitourinary:       Wears pads/pull ups for urine leakage  Musculoskeletal: Positive for back pain and joint pain. Negative for falls.       Chronic arthritic joint pains  Skin: Negative.        Skin warm and intact  Neurological: Negative.   Endo/Heme/Allergies: Negative.   Psychiatric/Behavioral: Positive for memory loss. The patient is nervous/anxious.     Physical Exam  Current Medications:   Current Outpatient Prescriptions  Medication Sig Dispense Refill  . acetaminophen (TYLENOL) 500 MG tablet Take 500 mg by mouth every 6 (six) hours as needed.    Marland Kitchen albuterol (PROVENTIL HFA;VENTOLIN HFA) 108 (90 BASE) MCG/ACT inhaler Inhale 2 puffs into the lungs every 4 (four) hours  as needed for wheezing or shortness of breath. 1 Inhaler 0  . aspirin EC 81 MG tablet Take 81 mg by mouth every morning.    Marland Kitchen atorvastatin (LIPITOR) 10 MG tablet Take 10 mg by mouth daily. Reported on 05/14/2015    . azelastine (ASTELIN) 0.1 % nasal spray Place 2 sprays into both nostrils 2 (two) times daily. Use in each nostril as directed 30 mL 3  . cetirizine (ZYRTEC) 10 MG tablet Take 10 mg by mouth daily. Reported on 05/14/2015    . cholecalciferol (VITAMIN D) 1000 UNITS tablet Take 1,000 Units by mouth every morning. Reported on 05/14/2015    . Chromium-Cinnamon [id number] MCG-MG CAPS Take 1 tablet by mouth at bedtime.     Marland Kitchen Dextromethorphan-Guaifenesin (TUSSIN DM PO) Take 5 mLs by mouth as needed (for cough).     . diclofenac sodium (VOLTAREN) 1 % GEL Apply 4 g topically 2 (two) times daily as needed (for joint pain). For joint pain    . Dietary Management Product (TOZAL PO) Take 3 tablets by mouth daily. For eyes    . docusate sodium (COLACE) 100 MG capsule Take 100-200 mg by mouth at bedtime as needed for constipation.     . fluticasone (FLONASE) 50 MCG/ACT nasal spray Place 2 sprays into the nose 2 (two) times daily.     Marland Kitchen glipiZIDE (GLUCOTROL XL) 10 MG 24 hr tablet Take 10 mg by mouth daily with breakfast.    .  hydroxypropyl methylcellulose (ISOPTO TEARS) 2.5 % ophthalmic solution Place 1 drop into both eyes 3 (three) times daily as needed (for dry eyes).    Marland Kitchen lisinopril (PRINIVIL,ZESTRIL) 5 MG tablet Take 5 mg by mouth daily.    . nebivolol (BYSTOLIC) 5 MG tablet Take 5 mg by mouth daily.    . ONGLYZA 5 MG TABS tablet 5 mg daily.     . pantoprazole (PROTONIX) 40 MG tablet Take 40 mg by mouth every morning.    . polyethylene glycol (MIRALAX / GLYCOLAX) packet Take 17 g by mouth daily as needed (for constipation).    . sertraline (ZOLOFT) 50 MG tablet Take 25-50 mg by mouth daily. For the first 6 days take half tab. Then a full tab as of 04-17-14    . sodium chloride (OCEAN) 0.65 % SOLN  nasal spray Place 1 spray into both nostrils as needed for congestion. 1 Bottle 0  . torsemide (DEMADEX) 20 MG tablet Take 20 mg by mouth daily.    . traMADol (ULTRAM) 50 MG tablet Take 50 mg by mouth 3 (three) times daily as needed (for pain.).     Marland Kitchen fexofenadine (ALLEGRA) 180 MG tablet Take 180 mg by mouth daily. Reported on 06/12/2015     No current facility-administered medications for this visit.    Functional Status:   In your present state of health, do you have any difficulty performing the following activities: 06/12/2015 05/04/2015  Hearing? Malvin Johns  Vision? Y Y  Difficulty concentrating or making decisions? Malvin Johns  Walking or climbing stairs? Y Y  Dressing or bathing? Y Y  Doing errands, shopping? Malvin Johns  Preparing Food and eating ? Y Y  Using the Toilet? Y Y  In the past six months, have you accidently leaked urine? Y N  Do you have problems with loss of bowel control? N N  Managing your Medications? Y Y  Managing your Finances? N N  Housekeeping or managing your Housekeeping? Malvin Johns    Fall/Depression Screening:    PHQ 2/9 Scores 06/12/2015 05/04/2015 03/02/2015 01/26/2015 12/04/2013  PHQ - 2 Score 1 1 0 2 0  PHQ- 9 Score - - - 3 -    Assessment:   82 year old female seen for congestive heart failure with goals met and currently seen for lymphedema referral per patient request. Arrived at patient's one-level home, noted patient sitting on her recliner with both lower extremities elevated. Caregiver [facility]) and patient are both present during this visit. Patient reports she was previously treated by primary care provider with antibiotics Cipro for 3 days for signs and symptoms of urinary tract infection that cleared away. She denies having any at this time.  Patient's desire for lymphedema management and referral to lymphedema clinic due to persistent profound swelling to bilateral lower extremities/ abdomen has come to its realization after a lengthy referral process. Patient is now on  her fifth visit out of 25 treatment sessions total. Patient reports that lymphedema therapist and a company representative from the lymphedema pump (that patient owns) is coming to patient's house next week to check on equipment and discuss about her treatment. Patient expressed her gratefulness to care management coordinator for making the lymphedema referral happen. Patient had mentioned her impatience on the treatment result so far. Explained to patient that treatment had just started and will have more sessions to come. Patient was also reminded that she also needs to do her part in continuing to exercise and use home lymphedema  pump as recommended. Call placed to lymphedema therapist to notify her of patient's concerns about the treatment in relation to her congestive heart failure. Encouraged patient to discuss her concerns on next weeks visit by therapist and representative. Patient and caregiver agreed.   Patient's weight had been continuously monitored and recorded per caregiver.  Patient's weight is ranging from 288- 293 pounds which disappoints her and she attributes it to her treatment. Reminded patient that the holidays has just been over and she could start working on her weight again. Caregiver reports that patient had been prompted to work on her arm and leg exercises. With regards to her walking, caregiver reports patient is able to walk around the inside of the house using her rollator walker about 5 times on a "good day" but on a "bad day", about twice.   Encouraged patient to continue with exercises as these are vital to her attempts of loosing weight. As patient had mentioned before, her "stubbornness is a barrier to manage her health". Caregiver was encouraged to continue prompting patient to improve health condition.  Patient verbalized she will call [place] Patient for follow-up on the small portable oxygen concentrator that she was desiring to have. Patient reports getting side  tracked with the holiday season and was unable to follow-up on it. She also mentioned wanting to see another ENT provider for ringing of her ears. She states that she would not like to go back to the current one she have. Patient agreed to call and request for a referral from her primary care provider.  Patient denies any concerns with her heart failure and diabetes at present. She continues to monitor her condition and takes her medications as directed. Patient reports rarely using Albuterol inhaler. She continues to use home oxygen at 2-3 liters per minute. Encouraged to evaluate self daily using heart failure zone tool and knows when to call the doctor for help. Her blood sugar readings for December to present ranges from 72- 134. Recent A1c 04/01/15 is down to 7.8.  Patient has scheduled follow-up visit with Dr. Inda Merlin (primary care provider) on 07/16/15. She also has a scheduled follow-up appointment with Dr. Elsworth Soho (pulmonologist) on 08/19/15. Transportation is not an issue for patient. Patient has 24 hour caregivers alternating to assist with her needs.  Makayla Rodriguez was notified of plan to close case with goals mostly met. She had expressed her gratitude for everything that was done for her. Patient declined the offer for telephonic care management referral for further disease management.  She was made aware that she can always be referred back to [facility] care management by her provider if deemed necessary. Patient is aware to call [facility], care management coordinator or 24-hour nurse line if needed. Contact informations are with patient.    Plan: Will close case. Will notify primary care provider of case closure.   THN CM Care Plan Problem One        Most Recent Value   Care Plan Problem One  knowledge deficit regarding heart failure   Role Documenting the Problem One  Care Management Coordinator   Care Plan for Problem One  Not Active   THN Long Term Goal (31-90 days)  patient will verabalize at least  3 strategies of heart failure management in the next 31-45 days   [facility] Long Term Goal Start Date  01/26/15   [facility] Long Term Goal Met Date  04/03/15 [goal met]   THN CM Short Term Goal #1 (0-30 days)  patient will verbalize the different heart failure zones and when to call the doctor for help in the next 30 days   THN CM Short Term Goal #1 Start Date  01/26/15   [facility] CM Short Term Goal #1 Met Date  03/02/15 [Goal met]   THN CM Short Term Goal #2 (0-30 days)  patient will verbalize and identify heart failure medications and when to take each in the next 30 days   THN CM Short Term Goal #2 Start Date  01/26/15   [facility] CM Short Term Goal #2 Met Date  03/02/15 [Goal met]   THN CM Short Term Goal #3 (0-30 days)  patient and caregiver will monitor weight and record twice a week in the next 30 days    THN CM Short Term Goal #3 Start Date  04/03/15   [facility] CM Short Term Goal #3 Met Date  05/04/15 [Goal met]    [facility] CM Care Plan Problem Two        Most Recent Value   Care Plan Problem Two  Limited mobility and ambulation related to swelling in her legs (Lymphedema)   Role Documenting the Problem Two  Care Management [place] for Problem Two  Not Active   Interventions for Problem Two Long Term Goal   praise patient for efforts to walk around the inside of the house several times a day as tolerated,  explain to start slowly to build endurance, then progress gradually as she tolerates,  encourage to continue arm and leg exercises and progress as tolerated,  encourage to continue use of lymphedema pump on her legs as recommended,  monitor weight and record,  encourage attendance to lymphedema treatment/sessions     THN Long Term Goal (31-90) days  patient will be able to report weight loss in the next 60 days   THN Long Term Goal Start Date  04/03/15   [facility] Long Term Goal Met Date  06/12/15 [Not met- just started with treatment 5 out of 25 sessions]   THN CM Short Term Goal #1 (0-30 days)  patient  will be referred to lymphedema clinic in the next 30 days   THN CM Short Term Goal #1 Start Date  04/03/15   [facility] CM Short Term Goal #1 Met Date   06/12/15 [Goal met- referred- treatment started on 05/14/15]   THN CM Short Term Goal #2 (0-30 days)  patient or caregiver will report range of motion exercises to arms and legs and ambulation inside of the house at least 5 times a day in the next 30 days    THN CM Short Term Goal #2 Start Date  04/03/15   [facility] CM Short Term Goal #2 Met Date  06/12/15 Martin Majestic met-ROM exercises recorded, walking ave:3 times/day]   Interventions for Short Term Goal #2  provide praise for patient's efforts to walk around the inside of the house with rollator several times a day as tolerated,  caregiver to remind and assist patient in performing range of motion exercises to arms and legs several times a day,  provide positive feedback for efforts exerted and emphasize benefits and importance of these exercises as vital factor in loosing weight,  remind to start slowly to build endurance and progress gradually        [place] A. Zamantha Strebel, BSN, RN-BC [place] Management Coordinator Cell: [phone]

## 2015-06-13 ENCOUNTER — Encounter: Payer: Self-pay | Admitting: *Deleted

## 2015-06-16 ENCOUNTER — Ambulatory Visit: Payer: Medicare Other | Admitting: Physical Therapy

## 2015-06-18 ENCOUNTER — Ambulatory Visit: Payer: Medicare Other | Admitting: Physical Therapy

## 2015-06-18 DIAGNOSIS — I890 Lymphedema, not elsewhere classified: Secondary | ICD-10-CM | POA: Diagnosis not present

## 2015-06-18 NOTE — Patient Instructions (Signed)
Over Head Pull: Narrow Grip        On back, knees bent, feet flat, band across thighs, elbows straight but relaxed. Pull hands apart (start). Keeping elbows straight, bring arms up and over head, hands toward floor. Keep pull steady on band. Hold momentarily. Return slowly, keeping pull steady, back to start. Repeat 5-10___ times. Band color __red____   Side Pull: Double Arm   On back, knees bent, feet flat. Arms perpendicular to body, shoulder level, elbows straight but relaxed. Pull arms out to sides, elbows straight. Resistance band comes across collarbones, hands toward floor. Hold momentarily. Slowly return to starting position. Repeat _5-10__ times. Band color __red___   Sash   On back, knees bent, feet flat, left hand on left hip, right hand above left. Pull right arm DIAGONALLY (hip to shoulder) across chest. Bring right arm along head toward floor. Hold momentarily. Slowly return to starting position. Repeat _5-10__ times. Do with left arm. Band color _red_____   Shoulder Rotation: Double Arm   On back, knees bent, feet flat, elbows tucked at sides, bent 90, hands palms up. Pull hands apart and down toward floor, keeping elbows near sides. Hold momentarily. Slowly return to starting position. Repeat _5-10__ times. Band color ___red___   

## 2015-06-18 NOTE — Therapy (Addendum)
[place], [place], [address] Phone: [phone]   Fax:  [phone]  Physical Therapy Treatment  Patient Details  Name: Makayla Rodriguez MRN: [id number] Date of Birth: [birth year] Referring Provider: Darcus Austin   Encounter Date: 06/18/2015      PT End of Session - 08/11/15 1335    Visit Number 6   Number of Visits 25   Date for PT Re-Evaluation 07/15/15   Activity Tolerance Patient tolerated treatment well   Behavior During Therapy [facility] for tasks assessed/performed      Past Medical History  Diagnosis Date  . Diabetes mellitus without complication ([place])   . Hypertension   . Anxiety   . Arthritis   . Obesity   . CKD (chronic kidney disease), stage III   . Obesity hypoventilation syndrome ([place])     uses O2 at home  . Shortness of breath     with annxiety attack  . CHF (congestive heart failure) ([place])     12/13 admission  . GERD (gastroesophageal reflux disease)   . Seasonal allergies   . Complication of anesthesia     O2 sat drop during  and went into coma  . Sleep apnea     CPAP    Past Surgical History  Procedure Laterality Date  . Tonsillectomy    . Cholecystectomy    .  fybroid tumors    . Breast surgery    . Eye surgery      cataracts  . Hernia repair      umbilical  . Pilonidal cyst excision      buttock  . Partial mastectomy with needle localization Left 04/05/2013    Procedure: PARTIAL MASTECTOMY WITH NEEDLE LOCALIZATION;  Surgeon: Adin Hector, MD;  Location: [facility];  Service: General;  Laterality: Left;    There were no vitals filed for this visit.  Visit Diagnosis:  Lymphedema of both lower extremities                       [facility] Adult PT Treatment/Exercise - 08/11/15 0001    Ambulation/Gait   Ambulation/Gait Yes   Ambulation/Gait Assistance 5: Supervision   Ambulation Distance (Feet) --  50   Assistive device Standard walker   Gait Pattern  Decreased step length - right;Decreased step length - left;Decreased hip/knee flexion - right;Decreased hip/knee flexion - left;Decreased dorsiflexion - right;Decreased dorsiflexion - left;Right flexed knee in stance;Left flexed knee in stance;Shuffle;Antalgic;Trunk flexed;Poor foot clearance - left;Poor foot clearance - right   Self-Care   Self-Care Other Self-Care Comments   Other Self-Care Comments  showed pt pictures of circ aid and she is happy with that. She demononstrated her home porgram that she is doing    Lumbar Exercises: Standing   Other Standing Lumbar Exercises standing for 2 minutes x 4 reps with attention to glute and core activation with mild weight shift    Lumbar Exercises: Seated   Other Seated Lumbar Exercises diphragmatic breathing with verbal and tactile cues, pt needs repeated practice    Knee/Hip Exercises: Seated   Marching AROM;Both;4 sets;Other (comment)  30 sec sets   Shoulder Exercises: Seated   Elevation Strengthening;Both;5 reps;Theraband  narrow and wide grip    Theraband Level (Shoulder Elevation) Level 2 (Red)   Horizontal ABduction Strengthening;Both;5 reps;Theraband   Theraband Level (Shoulder Horizontal ABduction) Level 2 (Red)   External Rotation Strengthening;Both;5 reps   Theraband Level (Shoulder External Rotation) Level 2 (Red)  Other Seated Exercises bilateral UE hip to hip with effort to sit tall 30 second increments x 4  with rest in between    Other Seated Exercises 'sash" with red theraband                    Short Term Clinic Goals - 08/11/15 1332    CC Short Term Goal  #1   Title Patient with verbalize an understanding of lymphedema risk reduction precautions (06/14/2015)   Status Achieved   CC Short Term Goal  #2   Title Patient will have a circumferential reduction of     3      cm at    10   cm above the   lateral border of left foot (06/14/2015)   Status Not Met   CC Short Term Goal  #3   Title Patient will have a  circumferential reduction of     2      cm at  10    cm above the   lateral border of the right foot (06/14/2015)    Status Not Met             Long Term Clinic Goals - 08/11/15 1332    CC Long Term Goal  #1   Title Patient will have a circumferential reduction of      6    cm at   10   cm above the     lateral border of the left foot (07/15/2015)   Status Not Met   CC Long Term Goal  #2   Title Patient will have a circumferential reduction of      4     cm at  10     cm above the   lateral border of the right foot (07/15/2015)   Status Not Met   CC Long Term Goal  #3   Title Patient will be independent in a home exercise program ( 07/15/2015) so she can continue to iimprove mobility at home    Status Achieved   CC Long Term Goal  #4   Title pt will report she and caregivers are able to manage her lymphedema at home and she will have/know how to obtain the needed equipement (07/15/2015)   Status Achieved            Plan - 08/11/15 1334    Clinical Impairments Affecting Rehab Potential congestive heart failure, on nasal O2   PT Next Visit Plan Reassess for effetiveness of garment and pt home exericse program.  Discharge    Consulted and Agree with Plan of Care Patient;Family member/caregiver   Family Member Consulted aundrea        Problem List Patient Active Problem List   Diagnosis Date Noted  . Chronic respiratory failure ([place]) 12/31/2014  . Morbid obesity with body mass index of 45.0-49.9 in adult [facility]) 09/20/2014  . Acute respiratory failure with hypoxia and hypercarbia ([facility])   . Pulmonary edema   . Respiratory failure with hypercapnia ([place]) 04/12/2014  . Dyspnea 09/28/2013  . COPD exacerbation ([place]) 09/28/2013  . Allergic rhinitis 09/11/2013  . Cancer of upper-outer quadrant of female breast ([place]) 03/07/2013  . OSA (obstructive sleep apnea) 08/09/2012  . Obesity hypoventilation syndrome ([place]) 08/09/2012  . Pleural mass 05/15/2012  . Acute diastolic heart  failure, NYHA class 2 ([place]) 05/11/2012  . Diabetes mellitus ([place]) 05/11/2012  . OA (osteoarthritis) of ankle 05/11/2012  . Hypertension 05/11/2012  . Morbid obesity (  [place]) 05/11/2012      Donato Heinz. Owens Shark, PT  08/11/2015, 1:35 PM        PHYSICAL THERAPY DISCHARGE SUMMARY  Visits from Start of Care: 6  Current functional level related to goals / functional outcomes: As above    Remaining deficits: As above   Education / Equipment: Home exercise program, arranged to be fit for compression garment  Plan: Patient agrees to discharge.  Patient goals were partially met. Patient is being discharged due to not returning since the last visit.  ?????         Maudry Diego, PT 08/11/2015 1:35 PM   [place], [place], [address] Phone: [phone]   Fax:  [phone]  Name: Makayla Rodriguez MRN: [id number] Date of Birth: 07-23-1931

## 2015-06-23 ENCOUNTER — Ambulatory Visit: Payer: Medicare Other | Admitting: Physical Therapy

## 2015-06-25 ENCOUNTER — Ambulatory Visit: Payer: Medicare Other | Admitting: Physical Therapy

## 2015-07-23 DIAGNOSIS — E1121 Type 2 diabetes mellitus with diabetic nephropathy: Secondary | ICD-10-CM | POA: Diagnosis not present

## 2015-07-23 DIAGNOSIS — J449 Chronic obstructive pulmonary disease, unspecified: Secondary | ICD-10-CM | POA: Diagnosis not present

## 2015-07-23 DIAGNOSIS — E1165 Type 2 diabetes mellitus with hyperglycemia: Secondary | ICD-10-CM | POA: Diagnosis not present

## 2015-07-23 DIAGNOSIS — E7800 Pure hypercholesterolemia, unspecified: Secondary | ICD-10-CM | POA: Diagnosis not present

## 2015-07-23 DIAGNOSIS — F419 Anxiety disorder, unspecified: Secondary | ICD-10-CM | POA: Diagnosis not present

## 2015-07-23 DIAGNOSIS — E662 Morbid (severe) obesity with alveolar hypoventilation: Secondary | ICD-10-CM | POA: Diagnosis not present

## 2015-07-23 DIAGNOSIS — I890 Lymphedema, not elsewhere classified: Secondary | ICD-10-CM | POA: Diagnosis not present

## 2015-07-23 DIAGNOSIS — G4733 Obstructive sleep apnea (adult) (pediatric): Secondary | ICD-10-CM | POA: Diagnosis not present

## 2015-07-23 DIAGNOSIS — I10 Essential (primary) hypertension: Secondary | ICD-10-CM | POA: Diagnosis not present

## 2015-07-23 DIAGNOSIS — J9612 Chronic respiratory failure with hypercapnia: Secondary | ICD-10-CM | POA: Diagnosis not present

## 2015-07-23 DIAGNOSIS — I5030 Unspecified diastolic (congestive) heart failure: Secondary | ICD-10-CM | POA: Diagnosis not present

## 2015-08-19 ENCOUNTER — Ambulatory Visit (INDEPENDENT_AMBULATORY_CARE_PROVIDER_SITE_OTHER): Payer: Medicare Other | Admitting: Pulmonary Disease

## 2015-08-19 ENCOUNTER — Encounter: Payer: Self-pay | Admitting: Pulmonary Disease

## 2015-08-19 VITALS — BP 142/80 | HR 79 | Ht 65.0 in

## 2015-08-19 DIAGNOSIS — J948 Other specified pleural conditions: Secondary | ICD-10-CM

## 2015-08-19 DIAGNOSIS — R222 Localized swelling, mass and lump, trunk: Secondary | ICD-10-CM

## 2015-08-19 DIAGNOSIS — J9611 Chronic respiratory failure with hypoxia: Secondary | ICD-10-CM | POA: Diagnosis not present

## 2015-08-19 DIAGNOSIS — G4733 Obstructive sleep apnea (adult) (pediatric): Secondary | ICD-10-CM

## 2015-08-19 NOTE — Patient Instructions (Signed)
CPAP download  Use 3L continuous at home, pulse ok when outside

## 2015-08-19 NOTE — Progress Notes (Signed)
   Subjective:    Patient ID: Makayla Rodriguez, female    DOB: 12/13/1931, 82 y.o.   MRN: [id number]  HPI  82 y.o MO female with OSA/ OHS for FU.  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 Follow-up    Mailed out card to DME and did not receive card back.  concerned about Lympthadema.  Breathing doing well.      accompanied by caregiver  Pt states breathing is doing good.   Wears CPAP At bedtime for around 6-7 hrs each night  Doing well with nasal mask with chin strap.   Requests portable concentrator  On 2l/m rest and 3l/m with act and night. -wonders if she needs continuous O2   she is frustrated with her other issues -unable to lose weight and lymphedema    Significant tests/ events  Admitted 04-2012 with mild resp acidosis & CXR s/o edema. She was aggressive diuresed -about 15L and  Placed on 24 h O2. She has OHS by ABGs with a PH of 7.4 and PCO2 of 77 and bicarb of 47.4. She has remained O2 dependent.     PSG 06/2012 >> AHI 98.7, SpO2 low 58%. Study done with 2 liters oxygen.  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 Pleasant, obese, in no distress ENT - no lesions, no post nasal drip Neck: No JVD, no  thyromegaly, no carotid bruits Lungs: no use of accessory muscles, no dullness to percussion, decreased without rales or rhonchi  Cardiovascular: Rhythm regular, heart sounds  normal, no murmurs or gallops, 2+ peripheral edema Musculoskeletal: No deformities, no cyanosis or clubbing , no tremors        Assessment & Plan:

## 2015-08-20 NOTE — Assessment & Plan Note (Signed)
CPAP download   Weight loss encouraged, compliance with goal of at least 4-6 hrs every night is the expectation. Advised against medications with sedative side effects Cautioned against driving when sleepy - understanding that sleepiness will vary on a day to day basis

## 2015-08-20 NOTE — Assessment & Plan Note (Signed)
Use 3L continuous at home, pulse ok when outside

## 2015-08-20 NOTE — Assessment & Plan Note (Signed)
This is benign and does not need further imaging follow-up

## 2015-08-28 DIAGNOSIS — J9612 Chronic respiratory failure with hypercapnia: Secondary | ICD-10-CM | POA: Diagnosis not present

## 2015-08-28 DIAGNOSIS — J209 Acute bronchitis, unspecified: Secondary | ICD-10-CM | POA: Diagnosis not present

## 2015-10-28 DIAGNOSIS — J309 Allergic rhinitis, unspecified: Secondary | ICD-10-CM | POA: Diagnosis not present

## 2015-10-28 DIAGNOSIS — J449 Chronic obstructive pulmonary disease, unspecified: Secondary | ICD-10-CM | POA: Diagnosis not present

## 2015-10-29 DIAGNOSIS — J9612 Chronic respiratory failure with hypercapnia: Secondary | ICD-10-CM | POA: Diagnosis not present

## 2015-10-29 DIAGNOSIS — I890 Lymphedema, not elsewhere classified: Secondary | ICD-10-CM | POA: Diagnosis not present

## 2015-10-29 DIAGNOSIS — J449 Chronic obstructive pulmonary disease, unspecified: Secondary | ICD-10-CM | POA: Diagnosis not present

## 2015-10-29 DIAGNOSIS — I10 Essential (primary) hypertension: Secondary | ICD-10-CM | POA: Diagnosis not present

## 2015-10-29 DIAGNOSIS — E1121 Type 2 diabetes mellitus with diabetic nephropathy: Secondary | ICD-10-CM | POA: Diagnosis not present

## 2015-10-29 DIAGNOSIS — Z7984 Long term (current) use of oral hypoglycemic drugs: Secondary | ICD-10-CM | POA: Diagnosis not present

## 2015-10-29 DIAGNOSIS — E1165 Type 2 diabetes mellitus with hyperglycemia: Secondary | ICD-10-CM | POA: Diagnosis not present

## 2015-10-29 DIAGNOSIS — E7800 Pure hypercholesterolemia, unspecified: Secondary | ICD-10-CM | POA: Diagnosis not present

## 2015-10-29 DIAGNOSIS — F419 Anxiety disorder, unspecified: Secondary | ICD-10-CM | POA: Diagnosis not present

## 2015-11-05 ENCOUNTER — Telehealth: Payer: Self-pay | Admitting: Pulmonary Disease

## 2015-11-05 DIAGNOSIS — Z7984 Long term (current) use of oral hypoglycemic drugs: Secondary | ICD-10-CM | POA: Diagnosis not present

## 2015-11-05 DIAGNOSIS — E1121 Type 2 diabetes mellitus with diabetic nephropathy: Secondary | ICD-10-CM | POA: Diagnosis not present

## 2015-11-05 NOTE — Telephone Encounter (Signed)
Per Dr. Elsworth Soho, compliance report dated 10/15/15 shows on 9cm, no residuals, good usage, some leakage. -----------------------------------

## 2015-11-05 NOTE — Telephone Encounter (Signed)
Attempted to contact patient, left message for patient to call back.

## 2015-11-06 NOTE — Telephone Encounter (Signed)
Patient returning call-prm  °

## 2015-11-06 NOTE — Telephone Encounter (Signed)
Msg from answering service taken that patient returned call, [phone].

## 2015-11-06 NOTE — Telephone Encounter (Signed)
Attempted to contact pt. No answer, no option to leave a message. Will try back.  

## 2015-11-06 NOTE — Telephone Encounter (Signed)
Spoke with pt. She is aware of results. Nothing further was needed.  

## 2015-11-27 DIAGNOSIS — I10 Essential (primary) hypertension: Secondary | ICD-10-CM | POA: Diagnosis not present

## 2015-11-27 DIAGNOSIS — I5030 Unspecified diastolic (congestive) heart failure: Secondary | ICD-10-CM | POA: Diagnosis not present

## 2015-11-27 DIAGNOSIS — N183 Chronic kidney disease, stage 3 (moderate): Secondary | ICD-10-CM | POA: Diagnosis not present

## 2015-11-27 DIAGNOSIS — J449 Chronic obstructive pulmonary disease, unspecified: Secondary | ICD-10-CM | POA: Diagnosis not present

## 2015-11-27 DIAGNOSIS — E1165 Type 2 diabetes mellitus with hyperglycemia: Secondary | ICD-10-CM | POA: Diagnosis not present

## 2015-11-27 DIAGNOSIS — M179 Osteoarthritis of knee, unspecified: Secondary | ICD-10-CM | POA: Diagnosis not present

## 2015-11-27 DIAGNOSIS — E1121 Type 2 diabetes mellitus with diabetic nephropathy: Secondary | ICD-10-CM | POA: Diagnosis not present

## 2015-12-31 ENCOUNTER — Telehealth: Payer: Self-pay | Admitting: Pulmonary Disease

## 2015-12-31 NOTE — Telephone Encounter (Signed)
She does not have COPD Set up office visit to review

## 2015-12-31 NOTE — Telephone Encounter (Signed)
Called and spoke with pt and she is aware of RA recs. She has a pending appt on 9/22 at 230 and she is aware that this will be discussed at this appt.

## 2015-12-31 NOTE — Telephone Encounter (Signed)
Called and spoke with [facility] with [facility]---she stated that the pt is requesting to be set up with a [employer] machine.  She stated that the pt is c/o that she is feeling not well rested at night, using her inhalers more often, increase SOB during the day.  Caryl Pina stated that the pt is a candidate for this machine due to her DX.  Caryl Pina is faxing a form over and once received, will forward to RA for review.  RA please advise. thanks  Allergies  Allergen Reactions  . Demerol [Meperidine] Anaphylaxis    Reaction: Hypoventilation after surgery  . Penicillins Anaphylaxis  . Phenergan [Promethazine Hcl] Anaphylaxis    Reaction: hypoventilation  . Codeine Nausea And Vomiting  . Macrobid [Nitrofurantoin] Nausea And Vomiting  . Relafen [Nabumetone] Itching and Swelling

## 2016-01-12 DIAGNOSIS — I10 Essential (primary) hypertension: Secondary | ICD-10-CM | POA: Diagnosis not present

## 2016-01-12 DIAGNOSIS — J449 Chronic obstructive pulmonary disease, unspecified: Secondary | ICD-10-CM | POA: Diagnosis not present

## 2016-01-12 DIAGNOSIS — M179 Osteoarthritis of knee, unspecified: Secondary | ICD-10-CM | POA: Diagnosis not present

## 2016-01-12 DIAGNOSIS — E1121 Type 2 diabetes mellitus with diabetic nephropathy: Secondary | ICD-10-CM | POA: Diagnosis not present

## 2016-01-12 DIAGNOSIS — Z7984 Long term (current) use of oral hypoglycemic drugs: Secondary | ICD-10-CM | POA: Diagnosis not present

## 2016-01-12 DIAGNOSIS — I5030 Unspecified diastolic (congestive) heart failure: Secondary | ICD-10-CM | POA: Diagnosis not present

## 2016-01-12 DIAGNOSIS — N183 Chronic kidney disease, stage 3 (moderate): Secondary | ICD-10-CM | POA: Diagnosis not present

## 2016-01-12 DIAGNOSIS — E1165 Type 2 diabetes mellitus with hyperglycemia: Secondary | ICD-10-CM | POA: Diagnosis not present

## 2016-01-13 DIAGNOSIS — Z853 Personal history of malignant neoplasm of breast: Secondary | ICD-10-CM | POA: Diagnosis not present

## 2016-01-13 DIAGNOSIS — Z9189 Other specified personal risk factors, not elsewhere classified: Secondary | ICD-10-CM | POA: Diagnosis not present

## 2016-02-08 DIAGNOSIS — M179 Osteoarthritis of knee, unspecified: Secondary | ICD-10-CM | POA: Diagnosis not present

## 2016-02-08 DIAGNOSIS — I5030 Unspecified diastolic (congestive) heart failure: Secondary | ICD-10-CM | POA: Diagnosis not present

## 2016-02-08 DIAGNOSIS — N183 Chronic kidney disease, stage 3 (moderate): Secondary | ICD-10-CM | POA: Diagnosis not present

## 2016-02-08 DIAGNOSIS — E1121 Type 2 diabetes mellitus with diabetic nephropathy: Secondary | ICD-10-CM | POA: Diagnosis not present

## 2016-02-08 DIAGNOSIS — J449 Chronic obstructive pulmonary disease, unspecified: Secondary | ICD-10-CM | POA: Diagnosis not present

## 2016-02-08 DIAGNOSIS — I10 Essential (primary) hypertension: Secondary | ICD-10-CM | POA: Diagnosis not present

## 2016-02-08 DIAGNOSIS — E1165 Type 2 diabetes mellitus with hyperglycemia: Secondary | ICD-10-CM | POA: Diagnosis not present

## 2016-02-16 ENCOUNTER — Telehealth: Payer: Self-pay | Admitting: Pulmonary Disease

## 2016-02-16 NOTE — Telephone Encounter (Signed)
Attempted to contact pt. No answer, no option to leave a message. Will try back.  
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
## 2016-02-19 ENCOUNTER — Ambulatory Visit: Payer: Medicare Other | Admitting: Adult Health

## 2016-02-22 ENCOUNTER — Other Ambulatory Visit: Payer: Self-pay | Admitting: Oncology

## 2016-02-22 DIAGNOSIS — Z853 Personal history of malignant neoplasm of breast: Secondary | ICD-10-CM

## 2016-03-03 DIAGNOSIS — N390 Urinary tract infection, site not specified: Secondary | ICD-10-CM | POA: Diagnosis not present

## 2016-03-18 DIAGNOSIS — M179 Osteoarthritis of knee, unspecified: Secondary | ICD-10-CM | POA: Diagnosis not present

## 2016-03-18 DIAGNOSIS — Z7984 Long term (current) use of oral hypoglycemic drugs: Secondary | ICD-10-CM | POA: Diagnosis not present

## 2016-03-18 DIAGNOSIS — I5030 Unspecified diastolic (congestive) heart failure: Secondary | ICD-10-CM | POA: Diagnosis not present

## 2016-03-18 DIAGNOSIS — N183 Chronic kidney disease, stage 3 (moderate): Secondary | ICD-10-CM | POA: Diagnosis not present

## 2016-03-18 DIAGNOSIS — J449 Chronic obstructive pulmonary disease, unspecified: Secondary | ICD-10-CM | POA: Diagnosis not present

## 2016-03-18 DIAGNOSIS — I10 Essential (primary) hypertension: Secondary | ICD-10-CM | POA: Diagnosis not present

## 2016-03-18 DIAGNOSIS — E1165 Type 2 diabetes mellitus with hyperglycemia: Secondary | ICD-10-CM | POA: Diagnosis not present

## 2016-03-18 DIAGNOSIS — E1121 Type 2 diabetes mellitus with diabetic nephropathy: Secondary | ICD-10-CM | POA: Diagnosis not present

## 2016-03-22 ENCOUNTER — Ambulatory Visit: Payer: Medicare Other | Admitting: Pulmonary Disease

## 2016-04-02 IMAGING — CR DG CHEST 2V
2 series · 2 of 2 positions shown · non-contrast
Comparison: CT chest dated 09/05/2013

CLINICAL DATA: Shortness of breath, cough

EXAM:
CHEST  2 VIEW

[w chest lat]
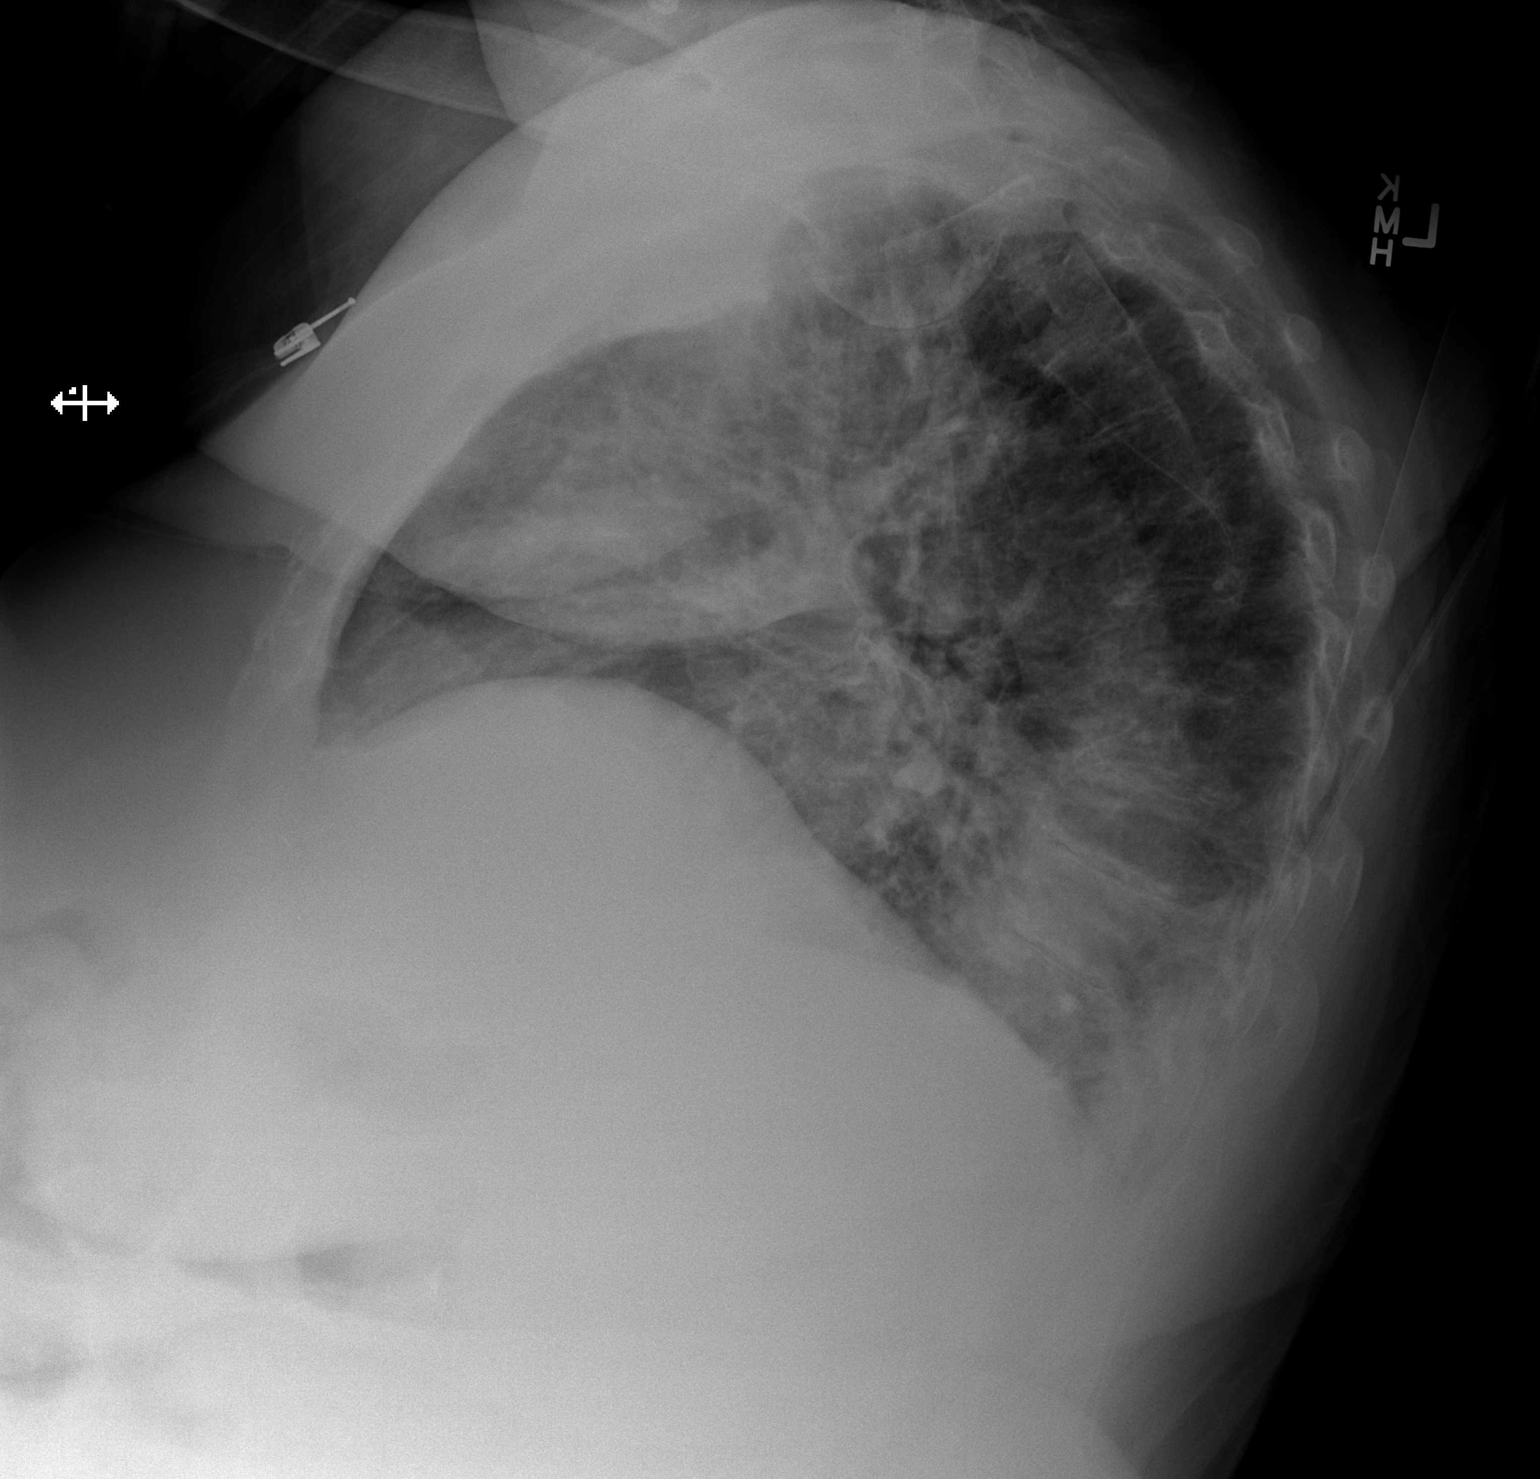

[x chest ap]
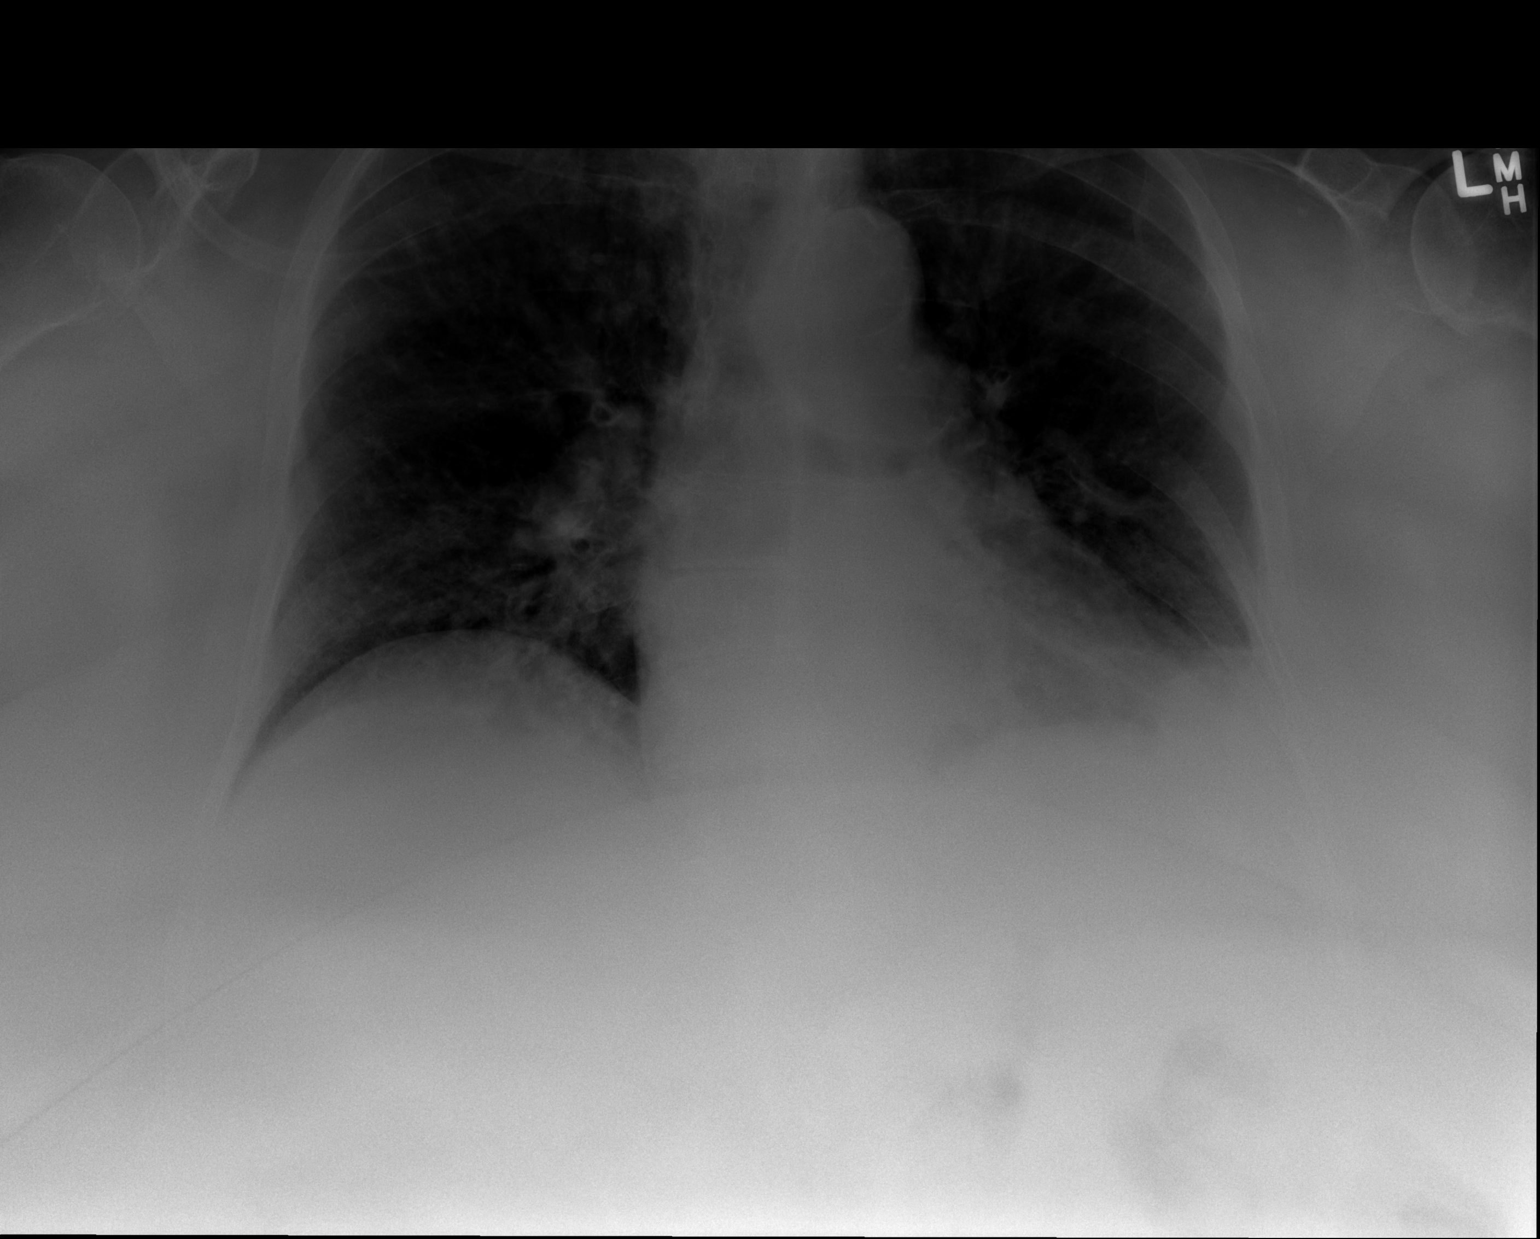

[2 of 2 positions shown; findings below may reference images not displayed]

FINDINGS: Rounded opacity at the lateral left lung base, corresponding to the
pleural based lesion on prior chest CT, favored to reflect a benign
solitary fibrous tumor of the pleura.

Increased interstitial markings, favored to be chronic. No pleural
effusion or pneumothorax.

Mild cardiomegaly.
IMPRESSION: Stable rounded opacity at the lateral left lung base, corresponding
to suspected solitary fibrous tumor of the pleural on CT.

No evidence of acute cardiopulmonary disease.

## 2016-04-06 DIAGNOSIS — M7502 Adhesive capsulitis of left shoulder: Secondary | ICD-10-CM | POA: Diagnosis not present

## 2016-04-06 DIAGNOSIS — I5030 Unspecified diastolic (congestive) heart failure: Secondary | ICD-10-CM | POA: Diagnosis not present

## 2016-04-06 DIAGNOSIS — E1121 Type 2 diabetes mellitus with diabetic nephropathy: Secondary | ICD-10-CM | POA: Diagnosis not present

## 2016-04-06 DIAGNOSIS — K219 Gastro-esophageal reflux disease without esophagitis: Secondary | ICD-10-CM | POA: Diagnosis not present

## 2016-04-06 DIAGNOSIS — E1165 Type 2 diabetes mellitus with hyperglycemia: Secondary | ICD-10-CM | POA: Diagnosis not present

## 2016-04-06 DIAGNOSIS — E7800 Pure hypercholesterolemia, unspecified: Secondary | ICD-10-CM | POA: Diagnosis not present

## 2016-04-06 DIAGNOSIS — J9612 Chronic respiratory failure with hypercapnia: Secondary | ICD-10-CM | POA: Diagnosis not present

## 2016-04-06 DIAGNOSIS — Z23 Encounter for immunization: Secondary | ICD-10-CM | POA: Diagnosis not present

## 2016-04-06 DIAGNOSIS — I10 Essential (primary) hypertension: Secondary | ICD-10-CM | POA: Diagnosis not present

## 2016-04-06 DIAGNOSIS — E662 Morbid (severe) obesity with alveolar hypoventilation: Secondary | ICD-10-CM | POA: Diagnosis not present

## 2016-04-06 DIAGNOSIS — J449 Chronic obstructive pulmonary disease, unspecified: Secondary | ICD-10-CM | POA: Diagnosis not present

## 2016-04-06 DIAGNOSIS — Z7984 Long term (current) use of oral hypoglycemic drugs: Secondary | ICD-10-CM | POA: Diagnosis not present

## 2016-04-12 ENCOUNTER — Telehealth: Payer: Self-pay

## 2016-04-12 NOTE — Telephone Encounter (Signed)
LMOM  To either bring her SD card or her machine for her appointment tomorrow.

## 2016-04-13 ENCOUNTER — Ambulatory Visit (INDEPENDENT_AMBULATORY_CARE_PROVIDER_SITE_OTHER): Payer: Medicare Other | Admitting: Pulmonary Disease

## 2016-04-13 ENCOUNTER — Encounter: Payer: Self-pay | Admitting: Pulmonary Disease

## 2016-04-13 DIAGNOSIS — J9611 Chronic respiratory failure with hypoxia: Secondary | ICD-10-CM | POA: Diagnosis not present

## 2016-04-13 DIAGNOSIS — G4733 Obstructive sleep apnea (adult) (pediatric): Secondary | ICD-10-CM

## 2016-04-13 MED ORDER — ALBUTEROL SULFATE HFA 108 (90 BASE) MCG/ACT IN AERS: 2.0000 | INHALATION_SPRAY | Freq: Four times a day (QID) | RESPIRATORY_TRACT | 3 refills | Status: DC | PRN

## 2016-04-13 NOTE — Assessment & Plan Note (Signed)
She will continue on CPAP 9 cm with 3 L oxygen blended and during sleep  Weight loss encouraged, compliance with goal of at least 4-6 hrs every night is the expectation. Advised against medications with sedative side effects Cautioned against driving when sleepy - understanding that sleepiness will vary on a day to day basis

## 2016-04-13 NOTE — Assessment & Plan Note (Signed)
This is related to obesity hypoventilation. She will use 2 L of oxygen at rest and 3 L during sleep. She has improved well with CPAP and subjectively improved her daytime fatigue and somnolence and I do not feel that she needs NIV at this time. Her last hospitalization was in 2013 when she was set up with CPAP and has done reasonably well

## 2016-04-13 NOTE — Progress Notes (Signed)
   Subjective:    Patient ID: Makayla Rodriguez, female    DOB: [birth year], 82 y.o.   MRN: [id number]  HPI  82 y.o [place] female with OSA/ OHS for FU.  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 Follow-up    pt states she wears cpap avg 5-6hr nightly, pt feels pressure & mask are okay. DME:[facility]    accompanied by caregiver  On 2l/m daytime  and 3l/m Blended into CPAP at night. She is compliant with CPAP and oxygen  She continues to struggle with her other issues -unable to lose weight and lymphedema   She uses pro-air respiclick  on occasion for dyspnea and would like a spare inhaler   We were asked by DME whether she needs NIV   09/2015  on 9cm, no residuals, good usage, some leakage.  Significant tests/ events  Admitted 04-2012 with mild resp acidosis & CXR s/o edema. She was aggressive diuresed -about 15L and  Placed on 24 h O2. She has OHS by ABGs with a PH of 7.4 and PCO2 of 77 and bicarb of 47.4. She has remained O2 dependent.     PSG 06/2012 >> AHI 98.7, SpO2 low 58%. Study done with 2 liters oxygen.  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 Also denies presyncope, palpitations, heartburn,  abdominal pain, nausea, vomiting, diarrhea or change in bowel or urinary habits, dysuria,hematuria, rash, arthralgias, visual complaints, headache, numbness weakness or ataxia.     Objective:   Physical Exam   Gen. Pleasant, obese, in no distress ENT - no lesions, no post nasal drip Neck: No JVD, no thyromegaly, no carotid bruits Lungs: no use of accessory muscles, no dullness to percussion, decreased without rales or rhonchi  Cardiovascular: Rhythm regular, heart sounds  normal, no murmurs or gallops, no peripheral edema Musculoskeletal: No deformities, no cyanosis or clubbing , no tremors        Assessment & Plan:

## 2016-04-13 NOTE — Patient Instructions (Signed)
STay on CPAP -3L during sleep 2L ok daytime yse saline drops each nare daily for dryness

## 2016-04-15 ENCOUNTER — Ambulatory Visit
Admission: RE | Admit: 2016-04-15 | Discharge: 2016-04-15 | Disposition: A | Discharge status: Home / Self Care | Payer: Medicare Other | Source: Ambulatory Visit | Attending: Oncology | Admitting: Oncology

## 2016-04-15 DIAGNOSIS — Z853 Personal history of malignant neoplasm of breast: Secondary | ICD-10-CM

## 2016-04-15 DIAGNOSIS — R928 Other abnormal and inconclusive findings on diagnostic imaging of breast: Secondary | ICD-10-CM | POA: Diagnosis not present

## 2016-04-25 ENCOUNTER — Telehealth: Payer: Self-pay | Admitting: Pulmonary Disease

## 2016-04-25 DIAGNOSIS — I130 Hypertensive heart and chronic kidney disease with heart failure and stage 1 through stage 4 chronic kidney disease, or unspecified chronic kidney disease: Secondary | ICD-10-CM | POA: Diagnosis not present

## 2016-04-25 DIAGNOSIS — M7502 Adhesive capsulitis of left shoulder: Secondary | ICD-10-CM | POA: Diagnosis not present

## 2016-04-25 DIAGNOSIS — J449 Chronic obstructive pulmonary disease, unspecified: Secondary | ICD-10-CM | POA: Diagnosis not present

## 2016-04-25 DIAGNOSIS — E1122 Type 2 diabetes mellitus with diabetic chronic kidney disease: Secondary | ICD-10-CM | POA: Diagnosis not present

## 2016-04-25 DIAGNOSIS — N183 Chronic kidney disease, stage 3 (moderate): Secondary | ICD-10-CM | POA: Diagnosis not present

## 2016-04-25 DIAGNOSIS — I5030 Unspecified diastolic (congestive) heart failure: Secondary | ICD-10-CM | POA: Diagnosis not present

## 2016-04-25 NOTE — Telephone Encounter (Signed)
Pt aware that we have not tried to call her recently. There are no telephone encounters or results other than a call to her the day before her appt to remind her of her appt and to bring CPAP to appt. Nothing further needed.

## 2016-04-29 DIAGNOSIS — N183 Chronic kidney disease, stage 3 (moderate): Secondary | ICD-10-CM | POA: Diagnosis not present

## 2016-04-29 DIAGNOSIS — M7502 Adhesive capsulitis of left shoulder: Secondary | ICD-10-CM | POA: Diagnosis not present

## 2016-04-29 DIAGNOSIS — E1122 Type 2 diabetes mellitus with diabetic chronic kidney disease: Secondary | ICD-10-CM | POA: Diagnosis not present

## 2016-04-29 DIAGNOSIS — I130 Hypertensive heart and chronic kidney disease with heart failure and stage 1 through stage 4 chronic kidney disease, or unspecified chronic kidney disease: Secondary | ICD-10-CM | POA: Diagnosis not present

## 2016-04-29 DIAGNOSIS — J449 Chronic obstructive pulmonary disease, unspecified: Secondary | ICD-10-CM | POA: Diagnosis not present

## 2016-04-29 DIAGNOSIS — I5030 Unspecified diastolic (congestive) heart failure: Secondary | ICD-10-CM | POA: Diagnosis not present

## 2016-05-04 DIAGNOSIS — I130 Hypertensive heart and chronic kidney disease with heart failure and stage 1 through stage 4 chronic kidney disease, or unspecified chronic kidney disease: Secondary | ICD-10-CM | POA: Diagnosis not present

## 2016-05-04 DIAGNOSIS — E1122 Type 2 diabetes mellitus with diabetic chronic kidney disease: Secondary | ICD-10-CM | POA: Diagnosis not present

## 2016-05-04 DIAGNOSIS — I5030 Unspecified diastolic (congestive) heart failure: Secondary | ICD-10-CM | POA: Diagnosis not present

## 2016-05-04 DIAGNOSIS — N183 Chronic kidney disease, stage 3 (moderate): Secondary | ICD-10-CM | POA: Diagnosis not present

## 2016-05-04 DIAGNOSIS — M7502 Adhesive capsulitis of left shoulder: Secondary | ICD-10-CM | POA: Diagnosis not present

## 2016-05-04 DIAGNOSIS — J449 Chronic obstructive pulmonary disease, unspecified: Secondary | ICD-10-CM | POA: Diagnosis not present

## 2016-05-05 DIAGNOSIS — I130 Hypertensive heart and chronic kidney disease with heart failure and stage 1 through stage 4 chronic kidney disease, or unspecified chronic kidney disease: Secondary | ICD-10-CM | POA: Diagnosis not present

## 2016-05-05 DIAGNOSIS — I5030 Unspecified diastolic (congestive) heart failure: Secondary | ICD-10-CM | POA: Diagnosis not present

## 2016-05-05 DIAGNOSIS — M7502 Adhesive capsulitis of left shoulder: Secondary | ICD-10-CM | POA: Diagnosis not present

## 2016-05-05 DIAGNOSIS — N183 Chronic kidney disease, stage 3 (moderate): Secondary | ICD-10-CM | POA: Diagnosis not present

## 2016-05-05 DIAGNOSIS — E1122 Type 2 diabetes mellitus with diabetic chronic kidney disease: Secondary | ICD-10-CM | POA: Diagnosis not present

## 2016-05-05 DIAGNOSIS — J449 Chronic obstructive pulmonary disease, unspecified: Secondary | ICD-10-CM | POA: Diagnosis not present

## 2016-05-09 DIAGNOSIS — N183 Chronic kidney disease, stage 3 (moderate): Secondary | ICD-10-CM | POA: Diagnosis not present

## 2016-05-09 DIAGNOSIS — I130 Hypertensive heart and chronic kidney disease with heart failure and stage 1 through stage 4 chronic kidney disease, or unspecified chronic kidney disease: Secondary | ICD-10-CM | POA: Diagnosis not present

## 2016-05-09 DIAGNOSIS — J449 Chronic obstructive pulmonary disease, unspecified: Secondary | ICD-10-CM | POA: Diagnosis not present

## 2016-05-09 DIAGNOSIS — M7502 Adhesive capsulitis of left shoulder: Secondary | ICD-10-CM | POA: Diagnosis not present

## 2016-05-09 DIAGNOSIS — I5030 Unspecified diastolic (congestive) heart failure: Secondary | ICD-10-CM | POA: Diagnosis not present

## 2016-05-09 DIAGNOSIS — E1122 Type 2 diabetes mellitus with diabetic chronic kidney disease: Secondary | ICD-10-CM | POA: Diagnosis not present

## 2016-05-10 DIAGNOSIS — J449 Chronic obstructive pulmonary disease, unspecified: Secondary | ICD-10-CM | POA: Diagnosis not present

## 2016-05-10 DIAGNOSIS — I5030 Unspecified diastolic (congestive) heart failure: Secondary | ICD-10-CM | POA: Diagnosis not present

## 2016-05-10 DIAGNOSIS — N183 Chronic kidney disease, stage 3 (moderate): Secondary | ICD-10-CM | POA: Diagnosis not present

## 2016-05-10 DIAGNOSIS — I130 Hypertensive heart and chronic kidney disease with heart failure and stage 1 through stage 4 chronic kidney disease, or unspecified chronic kidney disease: Secondary | ICD-10-CM | POA: Diagnosis not present

## 2016-05-10 DIAGNOSIS — E1122 Type 2 diabetes mellitus with diabetic chronic kidney disease: Secondary | ICD-10-CM | POA: Diagnosis not present

## 2016-05-10 DIAGNOSIS — M7502 Adhesive capsulitis of left shoulder: Secondary | ICD-10-CM | POA: Diagnosis not present

## 2016-05-12 DIAGNOSIS — M7502 Adhesive capsulitis of left shoulder: Secondary | ICD-10-CM | POA: Diagnosis not present

## 2016-05-12 DIAGNOSIS — I5030 Unspecified diastolic (congestive) heart failure: Secondary | ICD-10-CM | POA: Diagnosis not present

## 2016-05-12 DIAGNOSIS — E1122 Type 2 diabetes mellitus with diabetic chronic kidney disease: Secondary | ICD-10-CM | POA: Diagnosis not present

## 2016-05-12 DIAGNOSIS — N183 Chronic kidney disease, stage 3 (moderate): Secondary | ICD-10-CM | POA: Diagnosis not present

## 2016-05-12 DIAGNOSIS — J449 Chronic obstructive pulmonary disease, unspecified: Secondary | ICD-10-CM | POA: Diagnosis not present

## 2016-05-12 DIAGNOSIS — I130 Hypertensive heart and chronic kidney disease with heart failure and stage 1 through stage 4 chronic kidney disease, or unspecified chronic kidney disease: Secondary | ICD-10-CM | POA: Diagnosis not present

## 2016-05-16 DIAGNOSIS — I5030 Unspecified diastolic (congestive) heart failure: Secondary | ICD-10-CM | POA: Diagnosis not present

## 2016-05-16 DIAGNOSIS — N183 Chronic kidney disease, stage 3 (moderate): Secondary | ICD-10-CM | POA: Diagnosis not present

## 2016-05-16 DIAGNOSIS — M7502 Adhesive capsulitis of left shoulder: Secondary | ICD-10-CM | POA: Diagnosis not present

## 2016-05-16 DIAGNOSIS — E1122 Type 2 diabetes mellitus with diabetic chronic kidney disease: Secondary | ICD-10-CM | POA: Diagnosis not present

## 2016-05-16 DIAGNOSIS — J449 Chronic obstructive pulmonary disease, unspecified: Secondary | ICD-10-CM | POA: Diagnosis not present

## 2016-05-16 DIAGNOSIS — I130 Hypertensive heart and chronic kidney disease with heart failure and stage 1 through stage 4 chronic kidney disease, or unspecified chronic kidney disease: Secondary | ICD-10-CM | POA: Diagnosis not present

## 2016-05-17 DIAGNOSIS — I130 Hypertensive heart and chronic kidney disease with heart failure and stage 1 through stage 4 chronic kidney disease, or unspecified chronic kidney disease: Secondary | ICD-10-CM | POA: Diagnosis not present

## 2016-05-17 DIAGNOSIS — J449 Chronic obstructive pulmonary disease, unspecified: Secondary | ICD-10-CM | POA: Diagnosis not present

## 2016-05-17 DIAGNOSIS — M7502 Adhesive capsulitis of left shoulder: Secondary | ICD-10-CM | POA: Diagnosis not present

## 2016-05-17 DIAGNOSIS — I5030 Unspecified diastolic (congestive) heart failure: Secondary | ICD-10-CM | POA: Diagnosis not present

## 2016-05-17 DIAGNOSIS — E1122 Type 2 diabetes mellitus with diabetic chronic kidney disease: Secondary | ICD-10-CM | POA: Diagnosis not present

## 2016-05-17 DIAGNOSIS — N183 Chronic kidney disease, stage 3 (moderate): Secondary | ICD-10-CM | POA: Diagnosis not present

## 2016-05-19 DIAGNOSIS — I5030 Unspecified diastolic (congestive) heart failure: Secondary | ICD-10-CM | POA: Diagnosis not present

## 2016-05-19 DIAGNOSIS — E1122 Type 2 diabetes mellitus with diabetic chronic kidney disease: Secondary | ICD-10-CM | POA: Diagnosis not present

## 2016-05-19 DIAGNOSIS — J449 Chronic obstructive pulmonary disease, unspecified: Secondary | ICD-10-CM | POA: Diagnosis not present

## 2016-05-19 DIAGNOSIS — M7502 Adhesive capsulitis of left shoulder: Secondary | ICD-10-CM | POA: Diagnosis not present

## 2016-05-19 DIAGNOSIS — I130 Hypertensive heart and chronic kidney disease with heart failure and stage 1 through stage 4 chronic kidney disease, or unspecified chronic kidney disease: Secondary | ICD-10-CM | POA: Diagnosis not present

## 2016-05-19 DIAGNOSIS — N183 Chronic kidney disease, stage 3 (moderate): Secondary | ICD-10-CM | POA: Diagnosis not present

## 2016-05-20 DIAGNOSIS — I5030 Unspecified diastolic (congestive) heart failure: Secondary | ICD-10-CM | POA: Diagnosis not present

## 2016-05-20 DIAGNOSIS — M7502 Adhesive capsulitis of left shoulder: Secondary | ICD-10-CM | POA: Diagnosis not present

## 2016-05-20 DIAGNOSIS — N183 Chronic kidney disease, stage 3 (moderate): Secondary | ICD-10-CM | POA: Diagnosis not present

## 2016-05-20 DIAGNOSIS — I130 Hypertensive heart and chronic kidney disease with heart failure and stage 1 through stage 4 chronic kidney disease, or unspecified chronic kidney disease: Secondary | ICD-10-CM | POA: Diagnosis not present

## 2016-05-20 DIAGNOSIS — E1122 Type 2 diabetes mellitus with diabetic chronic kidney disease: Secondary | ICD-10-CM | POA: Diagnosis not present

## 2016-05-20 DIAGNOSIS — J449 Chronic obstructive pulmonary disease, unspecified: Secondary | ICD-10-CM | POA: Diagnosis not present

## 2016-05-24 DIAGNOSIS — M7502 Adhesive capsulitis of left shoulder: Secondary | ICD-10-CM | POA: Diagnosis not present

## 2016-05-24 DIAGNOSIS — I130 Hypertensive heart and chronic kidney disease with heart failure and stage 1 through stage 4 chronic kidney disease, or unspecified chronic kidney disease: Secondary | ICD-10-CM | POA: Diagnosis not present

## 2016-05-24 DIAGNOSIS — N183 Chronic kidney disease, stage 3 (moderate): Secondary | ICD-10-CM | POA: Diagnosis not present

## 2016-05-24 DIAGNOSIS — I5030 Unspecified diastolic (congestive) heart failure: Secondary | ICD-10-CM | POA: Diagnosis not present

## 2016-05-24 DIAGNOSIS — E1122 Type 2 diabetes mellitus with diabetic chronic kidney disease: Secondary | ICD-10-CM | POA: Diagnosis not present

## 2016-05-24 DIAGNOSIS — J449 Chronic obstructive pulmonary disease, unspecified: Secondary | ICD-10-CM | POA: Diagnosis not present

## 2016-05-25 DIAGNOSIS — M179 Osteoarthritis of knee, unspecified: Secondary | ICD-10-CM | POA: Diagnosis not present

## 2016-05-25 DIAGNOSIS — N183 Chronic kidney disease, stage 3 (moderate): Secondary | ICD-10-CM | POA: Diagnosis not present

## 2016-05-25 DIAGNOSIS — I10 Essential (primary) hypertension: Secondary | ICD-10-CM | POA: Diagnosis not present

## 2016-05-25 DIAGNOSIS — J449 Chronic obstructive pulmonary disease, unspecified: Secondary | ICD-10-CM | POA: Diagnosis not present

## 2016-05-25 DIAGNOSIS — E1122 Type 2 diabetes mellitus with diabetic chronic kidney disease: Secondary | ICD-10-CM | POA: Diagnosis not present

## 2016-05-25 DIAGNOSIS — Z7984 Long term (current) use of oral hypoglycemic drugs: Secondary | ICD-10-CM | POA: Diagnosis not present

## 2016-05-25 DIAGNOSIS — I5030 Unspecified diastolic (congestive) heart failure: Secondary | ICD-10-CM | POA: Diagnosis not present

## 2016-05-25 DIAGNOSIS — E1121 Type 2 diabetes mellitus with diabetic nephropathy: Secondary | ICD-10-CM | POA: Diagnosis not present

## 2016-05-25 DIAGNOSIS — E1165 Type 2 diabetes mellitus with hyperglycemia: Secondary | ICD-10-CM | POA: Diagnosis not present

## 2016-05-25 DIAGNOSIS — M7502 Adhesive capsulitis of left shoulder: Secondary | ICD-10-CM | POA: Diagnosis not present

## 2016-05-25 DIAGNOSIS — I130 Hypertensive heart and chronic kidney disease with heart failure and stage 1 through stage 4 chronic kidney disease, or unspecified chronic kidney disease: Secondary | ICD-10-CM | POA: Diagnosis not present

## 2016-05-26 DIAGNOSIS — J449 Chronic obstructive pulmonary disease, unspecified: Secondary | ICD-10-CM | POA: Diagnosis not present

## 2016-05-26 DIAGNOSIS — E1122 Type 2 diabetes mellitus with diabetic chronic kidney disease: Secondary | ICD-10-CM | POA: Diagnosis not present

## 2016-05-26 DIAGNOSIS — I130 Hypertensive heart and chronic kidney disease with heart failure and stage 1 through stage 4 chronic kidney disease, or unspecified chronic kidney disease: Secondary | ICD-10-CM | POA: Diagnosis not present

## 2016-05-26 DIAGNOSIS — M7502 Adhesive capsulitis of left shoulder: Secondary | ICD-10-CM | POA: Diagnosis not present

## 2016-05-26 DIAGNOSIS — N183 Chronic kidney disease, stage 3 (moderate): Secondary | ICD-10-CM | POA: Diagnosis not present

## 2016-05-26 DIAGNOSIS — I5030 Unspecified diastolic (congestive) heart failure: Secondary | ICD-10-CM | POA: Diagnosis not present

## 2016-05-31 DIAGNOSIS — M7502 Adhesive capsulitis of left shoulder: Secondary | ICD-10-CM | POA: Diagnosis not present

## 2016-05-31 DIAGNOSIS — I5030 Unspecified diastolic (congestive) heart failure: Secondary | ICD-10-CM | POA: Diagnosis not present

## 2016-05-31 DIAGNOSIS — I130 Hypertensive heart and chronic kidney disease with heart failure and stage 1 through stage 4 chronic kidney disease, or unspecified chronic kidney disease: Secondary | ICD-10-CM | POA: Diagnosis not present

## 2016-05-31 DIAGNOSIS — E1122 Type 2 diabetes mellitus with diabetic chronic kidney disease: Secondary | ICD-10-CM | POA: Diagnosis not present

## 2016-05-31 DIAGNOSIS — N183 Chronic kidney disease, stage 3 (moderate): Secondary | ICD-10-CM | POA: Diagnosis not present

## 2016-05-31 DIAGNOSIS — J449 Chronic obstructive pulmonary disease, unspecified: Secondary | ICD-10-CM | POA: Diagnosis not present

## 2016-06-01 DIAGNOSIS — J449 Chronic obstructive pulmonary disease, unspecified: Secondary | ICD-10-CM | POA: Diagnosis not present

## 2016-06-01 DIAGNOSIS — N183 Chronic kidney disease, stage 3 (moderate): Secondary | ICD-10-CM | POA: Diagnosis not present

## 2016-06-01 DIAGNOSIS — M7502 Adhesive capsulitis of left shoulder: Secondary | ICD-10-CM | POA: Diagnosis not present

## 2016-06-01 DIAGNOSIS — E1122 Type 2 diabetes mellitus with diabetic chronic kidney disease: Secondary | ICD-10-CM | POA: Diagnosis not present

## 2016-06-01 DIAGNOSIS — I130 Hypertensive heart and chronic kidney disease with heart failure and stage 1 through stage 4 chronic kidney disease, or unspecified chronic kidney disease: Secondary | ICD-10-CM | POA: Diagnosis not present

## 2016-06-01 DIAGNOSIS — I5030 Unspecified diastolic (congestive) heart failure: Secondary | ICD-10-CM | POA: Diagnosis not present

## 2016-06-02 DIAGNOSIS — J449 Chronic obstructive pulmonary disease, unspecified: Secondary | ICD-10-CM | POA: Diagnosis not present

## 2016-06-02 DIAGNOSIS — E1122 Type 2 diabetes mellitus with diabetic chronic kidney disease: Secondary | ICD-10-CM | POA: Diagnosis not present

## 2016-06-02 DIAGNOSIS — I5030 Unspecified diastolic (congestive) heart failure: Secondary | ICD-10-CM | POA: Diagnosis not present

## 2016-06-02 DIAGNOSIS — M7502 Adhesive capsulitis of left shoulder: Secondary | ICD-10-CM | POA: Diagnosis not present

## 2016-06-02 DIAGNOSIS — N183 Chronic kidney disease, stage 3 (moderate): Secondary | ICD-10-CM | POA: Diagnosis not present

## 2016-06-02 DIAGNOSIS — I130 Hypertensive heart and chronic kidney disease with heart failure and stage 1 through stage 4 chronic kidney disease, or unspecified chronic kidney disease: Secondary | ICD-10-CM | POA: Diagnosis not present

## 2016-06-07 DIAGNOSIS — E1122 Type 2 diabetes mellitus with diabetic chronic kidney disease: Secondary | ICD-10-CM | POA: Diagnosis not present

## 2016-06-07 DIAGNOSIS — N183 Chronic kidney disease, stage 3 (moderate): Secondary | ICD-10-CM | POA: Diagnosis not present

## 2016-06-07 DIAGNOSIS — I5030 Unspecified diastolic (congestive) heart failure: Secondary | ICD-10-CM | POA: Diagnosis not present

## 2016-06-07 DIAGNOSIS — I130 Hypertensive heart and chronic kidney disease with heart failure and stage 1 through stage 4 chronic kidney disease, or unspecified chronic kidney disease: Secondary | ICD-10-CM | POA: Diagnosis not present

## 2016-06-07 DIAGNOSIS — M7502 Adhesive capsulitis of left shoulder: Secondary | ICD-10-CM | POA: Diagnosis not present

## 2016-06-07 DIAGNOSIS — J449 Chronic obstructive pulmonary disease, unspecified: Secondary | ICD-10-CM | POA: Diagnosis not present

## 2016-06-09 DIAGNOSIS — M7502 Adhesive capsulitis of left shoulder: Secondary | ICD-10-CM | POA: Diagnosis not present

## 2016-06-09 DIAGNOSIS — J449 Chronic obstructive pulmonary disease, unspecified: Secondary | ICD-10-CM | POA: Diagnosis not present

## 2016-06-09 DIAGNOSIS — I130 Hypertensive heart and chronic kidney disease with heart failure and stage 1 through stage 4 chronic kidney disease, or unspecified chronic kidney disease: Secondary | ICD-10-CM | POA: Diagnosis not present

## 2016-06-09 DIAGNOSIS — N183 Chronic kidney disease, stage 3 (moderate): Secondary | ICD-10-CM | POA: Diagnosis not present

## 2016-06-09 DIAGNOSIS — I5030 Unspecified diastolic (congestive) heart failure: Secondary | ICD-10-CM | POA: Diagnosis not present

## 2016-06-09 DIAGNOSIS — E1122 Type 2 diabetes mellitus with diabetic chronic kidney disease: Secondary | ICD-10-CM | POA: Diagnosis not present

## 2016-06-14 DIAGNOSIS — I130 Hypertensive heart and chronic kidney disease with heart failure and stage 1 through stage 4 chronic kidney disease, or unspecified chronic kidney disease: Secondary | ICD-10-CM | POA: Diagnosis not present

## 2016-06-14 DIAGNOSIS — I5030 Unspecified diastolic (congestive) heart failure: Secondary | ICD-10-CM | POA: Diagnosis not present

## 2016-06-14 DIAGNOSIS — J449 Chronic obstructive pulmonary disease, unspecified: Secondary | ICD-10-CM | POA: Diagnosis not present

## 2016-06-14 DIAGNOSIS — N183 Chronic kidney disease, stage 3 (moderate): Secondary | ICD-10-CM | POA: Diagnosis not present

## 2016-06-14 DIAGNOSIS — M7502 Adhesive capsulitis of left shoulder: Secondary | ICD-10-CM | POA: Diagnosis not present

## 2016-06-14 DIAGNOSIS — E1122 Type 2 diabetes mellitus with diabetic chronic kidney disease: Secondary | ICD-10-CM | POA: Diagnosis not present

## 2016-06-16 DIAGNOSIS — I5030 Unspecified diastolic (congestive) heart failure: Secondary | ICD-10-CM | POA: Diagnosis not present

## 2016-06-16 DIAGNOSIS — M7502 Adhesive capsulitis of left shoulder: Secondary | ICD-10-CM | POA: Diagnosis not present

## 2016-06-16 DIAGNOSIS — N183 Chronic kidney disease, stage 3 (moderate): Secondary | ICD-10-CM | POA: Diagnosis not present

## 2016-06-16 DIAGNOSIS — J449 Chronic obstructive pulmonary disease, unspecified: Secondary | ICD-10-CM | POA: Diagnosis not present

## 2016-06-16 DIAGNOSIS — E1122 Type 2 diabetes mellitus with diabetic chronic kidney disease: Secondary | ICD-10-CM | POA: Diagnosis not present

## 2016-06-16 DIAGNOSIS — I130 Hypertensive heart and chronic kidney disease with heart failure and stage 1 through stage 4 chronic kidney disease, or unspecified chronic kidney disease: Secondary | ICD-10-CM | POA: Diagnosis not present

## 2016-06-17 DIAGNOSIS — I130 Hypertensive heart and chronic kidney disease with heart failure and stage 1 through stage 4 chronic kidney disease, or unspecified chronic kidney disease: Secondary | ICD-10-CM | POA: Diagnosis not present

## 2016-06-17 DIAGNOSIS — E1122 Type 2 diabetes mellitus with diabetic chronic kidney disease: Secondary | ICD-10-CM | POA: Diagnosis not present

## 2016-06-17 DIAGNOSIS — I5030 Unspecified diastolic (congestive) heart failure: Secondary | ICD-10-CM | POA: Diagnosis not present

## 2016-06-17 DIAGNOSIS — M7502 Adhesive capsulitis of left shoulder: Secondary | ICD-10-CM | POA: Diagnosis not present

## 2016-06-17 DIAGNOSIS — J449 Chronic obstructive pulmonary disease, unspecified: Secondary | ICD-10-CM | POA: Diagnosis not present

## 2016-06-17 DIAGNOSIS — N183 Chronic kidney disease, stage 3 (moderate): Secondary | ICD-10-CM | POA: Diagnosis not present

## 2016-06-20 ENCOUNTER — Telehealth: Payer: Self-pay | Admitting: Pulmonary Disease

## 2016-06-20 DIAGNOSIS — E1122 Type 2 diabetes mellitus with diabetic chronic kidney disease: Secondary | ICD-10-CM | POA: Diagnosis not present

## 2016-06-20 DIAGNOSIS — M7502 Adhesive capsulitis of left shoulder: Secondary | ICD-10-CM | POA: Diagnosis not present

## 2016-06-20 DIAGNOSIS — J449 Chronic obstructive pulmonary disease, unspecified: Secondary | ICD-10-CM | POA: Diagnosis not present

## 2016-06-20 DIAGNOSIS — N183 Chronic kidney disease, stage 3 (moderate): Secondary | ICD-10-CM | POA: Diagnosis not present

## 2016-06-20 DIAGNOSIS — I5030 Unspecified diastolic (congestive) heart failure: Secondary | ICD-10-CM | POA: Diagnosis not present

## 2016-06-20 DIAGNOSIS — I130 Hypertensive heart and chronic kidney disease with heart failure and stage 1 through stage 4 chronic kidney disease, or unspecified chronic kidney disease: Secondary | ICD-10-CM | POA: Diagnosis not present

## 2016-06-20 NOTE — Telephone Encounter (Signed)
Increase toresemide to 20 twice daily x 5ds , then report back

## 2016-06-20 NOTE — Telephone Encounter (Signed)
Spoke with pt, c/o worsening b/l feet, leg, and stomach swelling.  Pt states she has no respiratory complaints.  Pt called PCP who advised she go to ED, but pt does not want to go to ED unless her symptoms worsen.  States she has 3 CNA's who come to home to help her, and they can take her if her symptoms worsen.    Pt is on daily diuretic.  Pt has CHF per Epic chart, but states she does not have a cardiologist.    Pt has requested that I send this message as FYI only to RA- I advised that RA is working in the hospital and is unavailable at this time.  I offered to send message to DOD but pt declined, states that she just wants to keep RA aware.    Forwarding to RA as FYI.

## 2016-06-20 NOTE — Telephone Encounter (Signed)
Spoke with pt,aware of recs.  Nothing further needed.  

## 2016-06-21 ENCOUNTER — Telehealth: Payer: Self-pay | Admitting: Pulmonary Disease

## 2016-06-21 NOTE — Telephone Encounter (Signed)
Pt aware to increase torsemide to 20mg  bid X 5d per 06-20-16 phone note. Pt aware and voiced her understanding. Nothing further needed.

## 2016-06-22 DIAGNOSIS — E1122 Type 2 diabetes mellitus with diabetic chronic kidney disease: Secondary | ICD-10-CM | POA: Diagnosis not present

## 2016-06-22 DIAGNOSIS — M7502 Adhesive capsulitis of left shoulder: Secondary | ICD-10-CM | POA: Diagnosis not present

## 2016-06-22 DIAGNOSIS — J449 Chronic obstructive pulmonary disease, unspecified: Secondary | ICD-10-CM | POA: Diagnosis not present

## 2016-06-22 DIAGNOSIS — I5030 Unspecified diastolic (congestive) heart failure: Secondary | ICD-10-CM | POA: Diagnosis not present

## 2016-06-22 DIAGNOSIS — N183 Chronic kidney disease, stage 3 (moderate): Secondary | ICD-10-CM | POA: Diagnosis not present

## 2016-06-22 DIAGNOSIS — I130 Hypertensive heart and chronic kidney disease with heart failure and stage 1 through stage 4 chronic kidney disease, or unspecified chronic kidney disease: Secondary | ICD-10-CM | POA: Diagnosis not present

## 2016-06-24 DIAGNOSIS — I5030 Unspecified diastolic (congestive) heart failure: Secondary | ICD-10-CM | POA: Diagnosis not present

## 2016-06-24 DIAGNOSIS — I130 Hypertensive heart and chronic kidney disease with heart failure and stage 1 through stage 4 chronic kidney disease, or unspecified chronic kidney disease: Secondary | ICD-10-CM | POA: Diagnosis not present

## 2016-06-24 DIAGNOSIS — E1122 Type 2 diabetes mellitus with diabetic chronic kidney disease: Secondary | ICD-10-CM | POA: Diagnosis not present

## 2016-06-24 DIAGNOSIS — N183 Chronic kidney disease, stage 3 (moderate): Secondary | ICD-10-CM | POA: Diagnosis not present

## 2016-06-24 DIAGNOSIS — M7502 Adhesive capsulitis of left shoulder: Secondary | ICD-10-CM | POA: Diagnosis not present

## 2016-06-24 DIAGNOSIS — J449 Chronic obstructive pulmonary disease, unspecified: Secondary | ICD-10-CM | POA: Diagnosis not present

## 2016-06-27 DIAGNOSIS — N183 Chronic kidney disease, stage 3 (moderate): Secondary | ICD-10-CM | POA: Diagnosis not present

## 2016-06-27 DIAGNOSIS — J449 Chronic obstructive pulmonary disease, unspecified: Secondary | ICD-10-CM | POA: Diagnosis not present

## 2016-06-27 DIAGNOSIS — I5030 Unspecified diastolic (congestive) heart failure: Secondary | ICD-10-CM | POA: Diagnosis not present

## 2016-06-27 DIAGNOSIS — M179 Osteoarthritis of knee, unspecified: Secondary | ICD-10-CM | POA: Diagnosis not present

## 2016-06-27 DIAGNOSIS — Z7984 Long term (current) use of oral hypoglycemic drugs: Secondary | ICD-10-CM | POA: Diagnosis not present

## 2016-06-27 DIAGNOSIS — I10 Essential (primary) hypertension: Secondary | ICD-10-CM | POA: Diagnosis not present

## 2016-06-27 DIAGNOSIS — E1121 Type 2 diabetes mellitus with diabetic nephropathy: Secondary | ICD-10-CM | POA: Diagnosis not present

## 2016-06-27 DIAGNOSIS — E1165 Type 2 diabetes mellitus with hyperglycemia: Secondary | ICD-10-CM | POA: Diagnosis not present

## 2016-06-28 DIAGNOSIS — M7502 Adhesive capsulitis of left shoulder: Secondary | ICD-10-CM | POA: Diagnosis not present

## 2016-06-28 DIAGNOSIS — N183 Chronic kidney disease, stage 3 (moderate): Secondary | ICD-10-CM | POA: Diagnosis not present

## 2016-06-28 DIAGNOSIS — I5030 Unspecified diastolic (congestive) heart failure: Secondary | ICD-10-CM | POA: Diagnosis not present

## 2016-06-28 DIAGNOSIS — E1122 Type 2 diabetes mellitus with diabetic chronic kidney disease: Secondary | ICD-10-CM | POA: Diagnosis not present

## 2016-06-28 DIAGNOSIS — I130 Hypertensive heart and chronic kidney disease with heart failure and stage 1 through stage 4 chronic kidney disease, or unspecified chronic kidney disease: Secondary | ICD-10-CM | POA: Diagnosis not present

## 2016-06-28 DIAGNOSIS — J449 Chronic obstructive pulmonary disease, unspecified: Secondary | ICD-10-CM | POA: Diagnosis not present

## 2016-06-30 DIAGNOSIS — J449 Chronic obstructive pulmonary disease, unspecified: Secondary | ICD-10-CM | POA: Diagnosis not present

## 2016-06-30 DIAGNOSIS — I130 Hypertensive heart and chronic kidney disease with heart failure and stage 1 through stage 4 chronic kidney disease, or unspecified chronic kidney disease: Secondary | ICD-10-CM | POA: Diagnosis not present

## 2016-06-30 DIAGNOSIS — N183 Chronic kidney disease, stage 3 (moderate): Secondary | ICD-10-CM | POA: Diagnosis not present

## 2016-06-30 DIAGNOSIS — E1122 Type 2 diabetes mellitus with diabetic chronic kidney disease: Secondary | ICD-10-CM | POA: Diagnosis not present

## 2016-06-30 DIAGNOSIS — M7502 Adhesive capsulitis of left shoulder: Secondary | ICD-10-CM | POA: Diagnosis not present

## 2016-06-30 DIAGNOSIS — I5030 Unspecified diastolic (congestive) heart failure: Secondary | ICD-10-CM | POA: Diagnosis not present

## 2016-07-04 DIAGNOSIS — Z1211 Encounter for screening for malignant neoplasm of colon: Secondary | ICD-10-CM | POA: Diagnosis not present

## 2016-07-04 DIAGNOSIS — Z794 Long term (current) use of insulin: Secondary | ICD-10-CM | POA: Diagnosis not present

## 2016-07-04 DIAGNOSIS — I10 Essential (primary) hypertension: Secondary | ICD-10-CM | POA: Diagnosis not present

## 2016-07-04 DIAGNOSIS — E1165 Type 2 diabetes mellitus with hyperglycemia: Secondary | ICD-10-CM | POA: Diagnosis not present

## 2016-07-04 DIAGNOSIS — K5900 Constipation, unspecified: Secondary | ICD-10-CM | POA: Diagnosis not present

## 2016-07-04 DIAGNOSIS — R609 Edema, unspecified: Secondary | ICD-10-CM | POA: Diagnosis not present

## 2016-07-05 DIAGNOSIS — J449 Chronic obstructive pulmonary disease, unspecified: Secondary | ICD-10-CM | POA: Diagnosis not present

## 2016-07-05 DIAGNOSIS — M7502 Adhesive capsulitis of left shoulder: Secondary | ICD-10-CM | POA: Diagnosis not present

## 2016-07-05 DIAGNOSIS — I130 Hypertensive heart and chronic kidney disease with heart failure and stage 1 through stage 4 chronic kidney disease, or unspecified chronic kidney disease: Secondary | ICD-10-CM | POA: Diagnosis not present

## 2016-07-05 DIAGNOSIS — E1122 Type 2 diabetes mellitus with diabetic chronic kidney disease: Secondary | ICD-10-CM | POA: Diagnosis not present

## 2016-07-05 DIAGNOSIS — N183 Chronic kidney disease, stage 3 (moderate): Secondary | ICD-10-CM | POA: Diagnosis not present

## 2016-07-05 DIAGNOSIS — I5030 Unspecified diastolic (congestive) heart failure: Secondary | ICD-10-CM | POA: Diagnosis not present

## 2016-07-07 DIAGNOSIS — I5030 Unspecified diastolic (congestive) heart failure: Secondary | ICD-10-CM | POA: Diagnosis not present

## 2016-07-07 DIAGNOSIS — N183 Chronic kidney disease, stage 3 (moderate): Secondary | ICD-10-CM | POA: Diagnosis not present

## 2016-07-07 DIAGNOSIS — M7502 Adhesive capsulitis of left shoulder: Secondary | ICD-10-CM | POA: Diagnosis not present

## 2016-07-07 DIAGNOSIS — J449 Chronic obstructive pulmonary disease, unspecified: Secondary | ICD-10-CM | POA: Diagnosis not present

## 2016-07-07 DIAGNOSIS — E1122 Type 2 diabetes mellitus with diabetic chronic kidney disease: Secondary | ICD-10-CM | POA: Diagnosis not present

## 2016-07-07 DIAGNOSIS — I130 Hypertensive heart and chronic kidney disease with heart failure and stage 1 through stage 4 chronic kidney disease, or unspecified chronic kidney disease: Secondary | ICD-10-CM | POA: Diagnosis not present

## 2016-07-12 DIAGNOSIS — I5030 Unspecified diastolic (congestive) heart failure: Secondary | ICD-10-CM | POA: Diagnosis not present

## 2016-07-12 DIAGNOSIS — M7502 Adhesive capsulitis of left shoulder: Secondary | ICD-10-CM | POA: Diagnosis not present

## 2016-07-12 DIAGNOSIS — I130 Hypertensive heart and chronic kidney disease with heart failure and stage 1 through stage 4 chronic kidney disease, or unspecified chronic kidney disease: Secondary | ICD-10-CM | POA: Diagnosis not present

## 2016-07-12 DIAGNOSIS — N183 Chronic kidney disease, stage 3 (moderate): Secondary | ICD-10-CM | POA: Diagnosis not present

## 2016-07-12 DIAGNOSIS — J449 Chronic obstructive pulmonary disease, unspecified: Secondary | ICD-10-CM | POA: Diagnosis not present

## 2016-07-12 DIAGNOSIS — E1122 Type 2 diabetes mellitus with diabetic chronic kidney disease: Secondary | ICD-10-CM | POA: Diagnosis not present

## 2016-07-14 DIAGNOSIS — I5030 Unspecified diastolic (congestive) heart failure: Secondary | ICD-10-CM | POA: Diagnosis not present

## 2016-07-14 DIAGNOSIS — M7502 Adhesive capsulitis of left shoulder: Secondary | ICD-10-CM | POA: Diagnosis not present

## 2016-07-14 DIAGNOSIS — N183 Chronic kidney disease, stage 3 (moderate): Secondary | ICD-10-CM | POA: Diagnosis not present

## 2016-07-14 DIAGNOSIS — I130 Hypertensive heart and chronic kidney disease with heart failure and stage 1 through stage 4 chronic kidney disease, or unspecified chronic kidney disease: Secondary | ICD-10-CM | POA: Diagnosis not present

## 2016-07-14 DIAGNOSIS — E1122 Type 2 diabetes mellitus with diabetic chronic kidney disease: Secondary | ICD-10-CM | POA: Diagnosis not present

## 2016-07-14 DIAGNOSIS — J449 Chronic obstructive pulmonary disease, unspecified: Secondary | ICD-10-CM | POA: Diagnosis not present

## 2016-07-19 DIAGNOSIS — E1122 Type 2 diabetes mellitus with diabetic chronic kidney disease: Secondary | ICD-10-CM | POA: Diagnosis not present

## 2016-07-19 DIAGNOSIS — I130 Hypertensive heart and chronic kidney disease with heart failure and stage 1 through stage 4 chronic kidney disease, or unspecified chronic kidney disease: Secondary | ICD-10-CM | POA: Diagnosis not present

## 2016-07-19 DIAGNOSIS — M7502 Adhesive capsulitis of left shoulder: Secondary | ICD-10-CM | POA: Diagnosis not present

## 2016-07-19 DIAGNOSIS — J449 Chronic obstructive pulmonary disease, unspecified: Secondary | ICD-10-CM | POA: Diagnosis not present

## 2016-07-19 DIAGNOSIS — N183 Chronic kidney disease, stage 3 (moderate): Secondary | ICD-10-CM | POA: Diagnosis not present

## 2016-07-19 DIAGNOSIS — I5030 Unspecified diastolic (congestive) heart failure: Secondary | ICD-10-CM | POA: Diagnosis not present

## 2016-07-20 DIAGNOSIS — I5030 Unspecified diastolic (congestive) heart failure: Secondary | ICD-10-CM | POA: Diagnosis not present

## 2016-07-20 DIAGNOSIS — M7502 Adhesive capsulitis of left shoulder: Secondary | ICD-10-CM | POA: Diagnosis not present

## 2016-07-20 DIAGNOSIS — I130 Hypertensive heart and chronic kidney disease with heart failure and stage 1 through stage 4 chronic kidney disease, or unspecified chronic kidney disease: Secondary | ICD-10-CM | POA: Diagnosis not present

## 2016-07-20 DIAGNOSIS — N183 Chronic kidney disease, stage 3 (moderate): Secondary | ICD-10-CM | POA: Diagnosis not present

## 2016-07-20 DIAGNOSIS — M25512 Pain in left shoulder: Secondary | ICD-10-CM | POA: Diagnosis not present

## 2016-07-20 DIAGNOSIS — E1122 Type 2 diabetes mellitus with diabetic chronic kidney disease: Secondary | ICD-10-CM | POA: Diagnosis not present

## 2016-07-20 DIAGNOSIS — J449 Chronic obstructive pulmonary disease, unspecified: Secondary | ICD-10-CM | POA: Diagnosis not present

## 2016-07-25 DIAGNOSIS — N183 Chronic kidney disease, stage 3 (moderate): Secondary | ICD-10-CM | POA: Diagnosis not present

## 2016-07-25 DIAGNOSIS — E1122 Type 2 diabetes mellitus with diabetic chronic kidney disease: Secondary | ICD-10-CM | POA: Diagnosis not present

## 2016-07-25 DIAGNOSIS — J449 Chronic obstructive pulmonary disease, unspecified: Secondary | ICD-10-CM | POA: Diagnosis not present

## 2016-07-25 DIAGNOSIS — I130 Hypertensive heart and chronic kidney disease with heart failure and stage 1 through stage 4 chronic kidney disease, or unspecified chronic kidney disease: Secondary | ICD-10-CM | POA: Diagnosis not present

## 2016-07-25 DIAGNOSIS — M7502 Adhesive capsulitis of left shoulder: Secondary | ICD-10-CM | POA: Diagnosis not present

## 2016-07-25 DIAGNOSIS — I5030 Unspecified diastolic (congestive) heart failure: Secondary | ICD-10-CM | POA: Diagnosis not present

## 2016-07-26 DIAGNOSIS — N183 Chronic kidney disease, stage 3 (moderate): Secondary | ICD-10-CM | POA: Diagnosis not present

## 2016-07-26 DIAGNOSIS — I5030 Unspecified diastolic (congestive) heart failure: Secondary | ICD-10-CM | POA: Diagnosis not present

## 2016-07-26 DIAGNOSIS — M7502 Adhesive capsulitis of left shoulder: Secondary | ICD-10-CM | POA: Diagnosis not present

## 2016-07-26 DIAGNOSIS — J449 Chronic obstructive pulmonary disease, unspecified: Secondary | ICD-10-CM | POA: Diagnosis not present

## 2016-07-26 DIAGNOSIS — E1122 Type 2 diabetes mellitus with diabetic chronic kidney disease: Secondary | ICD-10-CM | POA: Diagnosis not present

## 2016-07-26 DIAGNOSIS — I130 Hypertensive heart and chronic kidney disease with heart failure and stage 1 through stage 4 chronic kidney disease, or unspecified chronic kidney disease: Secondary | ICD-10-CM | POA: Diagnosis not present

## 2016-07-27 DIAGNOSIS — E1121 Type 2 diabetes mellitus with diabetic nephropathy: Secondary | ICD-10-CM | POA: Diagnosis not present

## 2016-07-27 DIAGNOSIS — M179 Osteoarthritis of knee, unspecified: Secondary | ICD-10-CM | POA: Diagnosis not present

## 2016-07-27 DIAGNOSIS — I5030 Unspecified diastolic (congestive) heart failure: Secondary | ICD-10-CM | POA: Diagnosis not present

## 2016-07-27 DIAGNOSIS — Z7984 Long term (current) use of oral hypoglycemic drugs: Secondary | ICD-10-CM | POA: Diagnosis not present

## 2016-07-27 DIAGNOSIS — I10 Essential (primary) hypertension: Secondary | ICD-10-CM | POA: Diagnosis not present

## 2016-07-27 DIAGNOSIS — J449 Chronic obstructive pulmonary disease, unspecified: Secondary | ICD-10-CM | POA: Diagnosis not present

## 2016-07-27 DIAGNOSIS — N183 Chronic kidney disease, stage 3 (moderate): Secondary | ICD-10-CM | POA: Diagnosis not present

## 2016-07-27 DIAGNOSIS — E1165 Type 2 diabetes mellitus with hyperglycemia: Secondary | ICD-10-CM | POA: Diagnosis not present

## 2016-07-28 DIAGNOSIS — I5030 Unspecified diastolic (congestive) heart failure: Secondary | ICD-10-CM | POA: Diagnosis not present

## 2016-07-28 DIAGNOSIS — E1122 Type 2 diabetes mellitus with diabetic chronic kidney disease: Secondary | ICD-10-CM | POA: Diagnosis not present

## 2016-07-28 DIAGNOSIS — N183 Chronic kidney disease, stage 3 (moderate): Secondary | ICD-10-CM | POA: Diagnosis not present

## 2016-07-28 DIAGNOSIS — J449 Chronic obstructive pulmonary disease, unspecified: Secondary | ICD-10-CM | POA: Diagnosis not present

## 2016-07-28 DIAGNOSIS — M7502 Adhesive capsulitis of left shoulder: Secondary | ICD-10-CM | POA: Diagnosis not present

## 2016-07-28 DIAGNOSIS — I130 Hypertensive heart and chronic kidney disease with heart failure and stage 1 through stage 4 chronic kidney disease, or unspecified chronic kidney disease: Secondary | ICD-10-CM | POA: Diagnosis not present

## 2016-08-01 DIAGNOSIS — I5030 Unspecified diastolic (congestive) heart failure: Secondary | ICD-10-CM | POA: Diagnosis not present

## 2016-08-01 DIAGNOSIS — E1122 Type 2 diabetes mellitus with diabetic chronic kidney disease: Secondary | ICD-10-CM | POA: Diagnosis not present

## 2016-08-01 DIAGNOSIS — N183 Chronic kidney disease, stage 3 (moderate): Secondary | ICD-10-CM | POA: Diagnosis not present

## 2016-08-01 DIAGNOSIS — M7502 Adhesive capsulitis of left shoulder: Secondary | ICD-10-CM | POA: Diagnosis not present

## 2016-08-01 DIAGNOSIS — I130 Hypertensive heart and chronic kidney disease with heart failure and stage 1 through stage 4 chronic kidney disease, or unspecified chronic kidney disease: Secondary | ICD-10-CM | POA: Diagnosis not present

## 2016-08-01 DIAGNOSIS — J449 Chronic obstructive pulmonary disease, unspecified: Secondary | ICD-10-CM | POA: Diagnosis not present

## 2016-08-02 DIAGNOSIS — I130 Hypertensive heart and chronic kidney disease with heart failure and stage 1 through stage 4 chronic kidney disease, or unspecified chronic kidney disease: Secondary | ICD-10-CM | POA: Diagnosis not present

## 2016-08-02 DIAGNOSIS — E1122 Type 2 diabetes mellitus with diabetic chronic kidney disease: Secondary | ICD-10-CM | POA: Diagnosis not present

## 2016-08-02 DIAGNOSIS — I5030 Unspecified diastolic (congestive) heart failure: Secondary | ICD-10-CM | POA: Diagnosis not present

## 2016-08-02 DIAGNOSIS — N183 Chronic kidney disease, stage 3 (moderate): Secondary | ICD-10-CM | POA: Diagnosis not present

## 2016-08-02 DIAGNOSIS — M7502 Adhesive capsulitis of left shoulder: Secondary | ICD-10-CM | POA: Diagnosis not present

## 2016-08-02 DIAGNOSIS — J449 Chronic obstructive pulmonary disease, unspecified: Secondary | ICD-10-CM | POA: Diagnosis not present

## 2016-08-03 DIAGNOSIS — N183 Chronic kidney disease, stage 3 (moderate): Secondary | ICD-10-CM | POA: Diagnosis not present

## 2016-08-03 DIAGNOSIS — I5030 Unspecified diastolic (congestive) heart failure: Secondary | ICD-10-CM | POA: Diagnosis not present

## 2016-08-03 DIAGNOSIS — I130 Hypertensive heart and chronic kidney disease with heart failure and stage 1 through stage 4 chronic kidney disease, or unspecified chronic kidney disease: Secondary | ICD-10-CM | POA: Diagnosis not present

## 2016-08-03 DIAGNOSIS — M7502 Adhesive capsulitis of left shoulder: Secondary | ICD-10-CM | POA: Diagnosis not present

## 2016-08-03 DIAGNOSIS — J449 Chronic obstructive pulmonary disease, unspecified: Secondary | ICD-10-CM | POA: Diagnosis not present

## 2016-08-03 DIAGNOSIS — E1122 Type 2 diabetes mellitus with diabetic chronic kidney disease: Secondary | ICD-10-CM | POA: Diagnosis not present

## 2016-08-04 DIAGNOSIS — N183 Chronic kidney disease, stage 3 (moderate): Secondary | ICD-10-CM | POA: Diagnosis not present

## 2016-08-04 DIAGNOSIS — I5030 Unspecified diastolic (congestive) heart failure: Secondary | ICD-10-CM | POA: Diagnosis not present

## 2016-08-04 DIAGNOSIS — I130 Hypertensive heart and chronic kidney disease with heart failure and stage 1 through stage 4 chronic kidney disease, or unspecified chronic kidney disease: Secondary | ICD-10-CM | POA: Diagnosis not present

## 2016-08-04 DIAGNOSIS — M7502 Adhesive capsulitis of left shoulder: Secondary | ICD-10-CM | POA: Diagnosis not present

## 2016-08-04 DIAGNOSIS — J449 Chronic obstructive pulmonary disease, unspecified: Secondary | ICD-10-CM | POA: Diagnosis not present

## 2016-08-04 DIAGNOSIS — E1122 Type 2 diabetes mellitus with diabetic chronic kidney disease: Secondary | ICD-10-CM | POA: Diagnosis not present

## 2016-08-10 DIAGNOSIS — I5030 Unspecified diastolic (congestive) heart failure: Secondary | ICD-10-CM | POA: Diagnosis not present

## 2016-08-10 DIAGNOSIS — M7502 Adhesive capsulitis of left shoulder: Secondary | ICD-10-CM | POA: Diagnosis not present

## 2016-08-10 DIAGNOSIS — N183 Chronic kidney disease, stage 3 (moderate): Secondary | ICD-10-CM | POA: Diagnosis not present

## 2016-08-10 DIAGNOSIS — I130 Hypertensive heart and chronic kidney disease with heart failure and stage 1 through stage 4 chronic kidney disease, or unspecified chronic kidney disease: Secondary | ICD-10-CM | POA: Diagnosis not present

## 2016-08-10 DIAGNOSIS — E1122 Type 2 diabetes mellitus with diabetic chronic kidney disease: Secondary | ICD-10-CM | POA: Diagnosis not present

## 2016-08-10 DIAGNOSIS — J449 Chronic obstructive pulmonary disease, unspecified: Secondary | ICD-10-CM | POA: Diagnosis not present

## 2016-08-11 DIAGNOSIS — Z7984 Long term (current) use of oral hypoglycemic drugs: Secondary | ICD-10-CM | POA: Diagnosis not present

## 2016-08-11 DIAGNOSIS — E1122 Type 2 diabetes mellitus with diabetic chronic kidney disease: Secondary | ICD-10-CM | POA: Diagnosis not present

## 2016-08-11 DIAGNOSIS — E1121 Type 2 diabetes mellitus with diabetic nephropathy: Secondary | ICD-10-CM | POA: Diagnosis not present

## 2016-08-11 DIAGNOSIS — E1165 Type 2 diabetes mellitus with hyperglycemia: Secondary | ICD-10-CM | POA: Diagnosis not present

## 2016-08-11 DIAGNOSIS — I10 Essential (primary) hypertension: Secondary | ICD-10-CM | POA: Diagnosis not present

## 2016-08-11 DIAGNOSIS — N183 Chronic kidney disease, stage 3 (moderate): Secondary | ICD-10-CM | POA: Diagnosis not present

## 2016-08-11 DIAGNOSIS — M7502 Adhesive capsulitis of left shoulder: Secondary | ICD-10-CM | POA: Diagnosis not present

## 2016-08-11 DIAGNOSIS — J449 Chronic obstructive pulmonary disease, unspecified: Secondary | ICD-10-CM | POA: Diagnosis not present

## 2016-08-11 DIAGNOSIS — I5030 Unspecified diastolic (congestive) heart failure: Secondary | ICD-10-CM | POA: Diagnosis not present

## 2016-08-11 DIAGNOSIS — I130 Hypertensive heart and chronic kidney disease with heart failure and stage 1 through stage 4 chronic kidney disease, or unspecified chronic kidney disease: Secondary | ICD-10-CM | POA: Diagnosis not present

## 2016-08-11 DIAGNOSIS — M179 Osteoarthritis of knee, unspecified: Secondary | ICD-10-CM | POA: Diagnosis not present

## 2016-08-15 DIAGNOSIS — N183 Chronic kidney disease, stage 3 (moderate): Secondary | ICD-10-CM | POA: Diagnosis not present

## 2016-08-15 DIAGNOSIS — I130 Hypertensive heart and chronic kidney disease with heart failure and stage 1 through stage 4 chronic kidney disease, or unspecified chronic kidney disease: Secondary | ICD-10-CM | POA: Diagnosis not present

## 2016-08-15 DIAGNOSIS — E1122 Type 2 diabetes mellitus with diabetic chronic kidney disease: Secondary | ICD-10-CM | POA: Diagnosis not present

## 2016-08-15 DIAGNOSIS — J449 Chronic obstructive pulmonary disease, unspecified: Secondary | ICD-10-CM | POA: Diagnosis not present

## 2016-08-15 DIAGNOSIS — M7502 Adhesive capsulitis of left shoulder: Secondary | ICD-10-CM | POA: Diagnosis not present

## 2016-08-15 DIAGNOSIS — I5030 Unspecified diastolic (congestive) heart failure: Secondary | ICD-10-CM | POA: Diagnosis not present

## 2016-08-16 DIAGNOSIS — M7502 Adhesive capsulitis of left shoulder: Secondary | ICD-10-CM | POA: Diagnosis not present

## 2016-08-16 DIAGNOSIS — J449 Chronic obstructive pulmonary disease, unspecified: Secondary | ICD-10-CM | POA: Diagnosis not present

## 2016-08-16 DIAGNOSIS — I130 Hypertensive heart and chronic kidney disease with heart failure and stage 1 through stage 4 chronic kidney disease, or unspecified chronic kidney disease: Secondary | ICD-10-CM | POA: Diagnosis not present

## 2016-08-16 DIAGNOSIS — N183 Chronic kidney disease, stage 3 (moderate): Secondary | ICD-10-CM | POA: Diagnosis not present

## 2016-08-16 DIAGNOSIS — E1122 Type 2 diabetes mellitus with diabetic chronic kidney disease: Secondary | ICD-10-CM | POA: Diagnosis not present

## 2016-08-16 DIAGNOSIS — I5030 Unspecified diastolic (congestive) heart failure: Secondary | ICD-10-CM | POA: Diagnosis not present

## 2016-08-17 DIAGNOSIS — I130 Hypertensive heart and chronic kidney disease with heart failure and stage 1 through stage 4 chronic kidney disease, or unspecified chronic kidney disease: Secondary | ICD-10-CM | POA: Diagnosis not present

## 2016-08-17 DIAGNOSIS — M7502 Adhesive capsulitis of left shoulder: Secondary | ICD-10-CM | POA: Diagnosis not present

## 2016-08-17 DIAGNOSIS — E1122 Type 2 diabetes mellitus with diabetic chronic kidney disease: Secondary | ICD-10-CM | POA: Diagnosis not present

## 2016-08-17 DIAGNOSIS — J449 Chronic obstructive pulmonary disease, unspecified: Secondary | ICD-10-CM | POA: Diagnosis not present

## 2016-08-17 DIAGNOSIS — I5030 Unspecified diastolic (congestive) heart failure: Secondary | ICD-10-CM | POA: Diagnosis not present

## 2016-08-17 DIAGNOSIS — N183 Chronic kidney disease, stage 3 (moderate): Secondary | ICD-10-CM | POA: Diagnosis not present

## 2016-08-18 DIAGNOSIS — E1122 Type 2 diabetes mellitus with diabetic chronic kidney disease: Secondary | ICD-10-CM | POA: Diagnosis not present

## 2016-08-18 DIAGNOSIS — J449 Chronic obstructive pulmonary disease, unspecified: Secondary | ICD-10-CM | POA: Diagnosis not present

## 2016-08-18 DIAGNOSIS — I5030 Unspecified diastolic (congestive) heart failure: Secondary | ICD-10-CM | POA: Diagnosis not present

## 2016-08-18 DIAGNOSIS — M7502 Adhesive capsulitis of left shoulder: Secondary | ICD-10-CM | POA: Diagnosis not present

## 2016-08-18 DIAGNOSIS — I130 Hypertensive heart and chronic kidney disease with heart failure and stage 1 through stage 4 chronic kidney disease, or unspecified chronic kidney disease: Secondary | ICD-10-CM | POA: Diagnosis not present

## 2016-08-18 DIAGNOSIS — N183 Chronic kidney disease, stage 3 (moderate): Secondary | ICD-10-CM | POA: Diagnosis not present

## 2016-08-23 DIAGNOSIS — M19012 Primary osteoarthritis, left shoulder: Secondary | ICD-10-CM | POA: Diagnosis not present

## 2016-08-23 DIAGNOSIS — M75102 Unspecified rotator cuff tear or rupture of left shoulder, not specified as traumatic: Secondary | ICD-10-CM | POA: Diagnosis not present

## 2016-08-23 DIAGNOSIS — I890 Lymphedema, not elsewhere classified: Secondary | ICD-10-CM | POA: Diagnosis not present

## 2016-08-23 DIAGNOSIS — E1122 Type 2 diabetes mellitus with diabetic chronic kidney disease: Secondary | ICD-10-CM | POA: Diagnosis not present

## 2016-08-23 DIAGNOSIS — J449 Chronic obstructive pulmonary disease, unspecified: Secondary | ICD-10-CM | POA: Diagnosis not present

## 2016-08-23 DIAGNOSIS — M7502 Adhesive capsulitis of left shoulder: Secondary | ICD-10-CM | POA: Diagnosis not present

## 2016-08-24 DIAGNOSIS — M19012 Primary osteoarthritis, left shoulder: Secondary | ICD-10-CM | POA: Diagnosis not present

## 2016-08-24 DIAGNOSIS — M7502 Adhesive capsulitis of left shoulder: Secondary | ICD-10-CM | POA: Diagnosis not present

## 2016-08-24 DIAGNOSIS — M75102 Unspecified rotator cuff tear or rupture of left shoulder, not specified as traumatic: Secondary | ICD-10-CM | POA: Diagnosis not present

## 2016-08-24 DIAGNOSIS — I890 Lymphedema, not elsewhere classified: Secondary | ICD-10-CM | POA: Diagnosis not present

## 2016-08-24 DIAGNOSIS — E1122 Type 2 diabetes mellitus with diabetic chronic kidney disease: Secondary | ICD-10-CM | POA: Diagnosis not present

## 2016-08-24 DIAGNOSIS — J449 Chronic obstructive pulmonary disease, unspecified: Secondary | ICD-10-CM | POA: Diagnosis not present

## 2016-09-21 DIAGNOSIS — I10 Essential (primary) hypertension: Secondary | ICD-10-CM | POA: Diagnosis not present

## 2016-09-21 DIAGNOSIS — N183 Chronic kidney disease, stage 3 (moderate): Secondary | ICD-10-CM | POA: Diagnosis not present

## 2016-09-21 DIAGNOSIS — I5030 Unspecified diastolic (congestive) heart failure: Secondary | ICD-10-CM | POA: Diagnosis not present

## 2016-09-21 DIAGNOSIS — E1121 Type 2 diabetes mellitus with diabetic nephropathy: Secondary | ICD-10-CM | POA: Diagnosis not present

## 2016-09-21 DIAGNOSIS — J449 Chronic obstructive pulmonary disease, unspecified: Secondary | ICD-10-CM | POA: Diagnosis not present

## 2016-09-21 DIAGNOSIS — E1165 Type 2 diabetes mellitus with hyperglycemia: Secondary | ICD-10-CM | POA: Diagnosis not present

## 2016-09-21 DIAGNOSIS — M179 Osteoarthritis of knee, unspecified: Secondary | ICD-10-CM | POA: Diagnosis not present

## 2016-09-21 DIAGNOSIS — Z7984 Long term (current) use of oral hypoglycemic drugs: Secondary | ICD-10-CM | POA: Diagnosis not present

## 2016-10-04 DIAGNOSIS — N183 Chronic kidney disease, stage 3 (moderate): Secondary | ICD-10-CM | POA: Diagnosis not present

## 2016-10-04 DIAGNOSIS — I10 Essential (primary) hypertension: Secondary | ICD-10-CM | POA: Diagnosis not present

## 2016-10-04 DIAGNOSIS — I5030 Unspecified diastolic (congestive) heart failure: Secondary | ICD-10-CM | POA: Diagnosis not present

## 2016-10-04 DIAGNOSIS — E1165 Type 2 diabetes mellitus with hyperglycemia: Secondary | ICD-10-CM | POA: Diagnosis not present

## 2016-10-04 DIAGNOSIS — J449 Chronic obstructive pulmonary disease, unspecified: Secondary | ICD-10-CM | POA: Diagnosis not present

## 2016-10-04 DIAGNOSIS — E1121 Type 2 diabetes mellitus with diabetic nephropathy: Secondary | ICD-10-CM | POA: Diagnosis not present

## 2016-10-04 DIAGNOSIS — M179 Osteoarthritis of knee, unspecified: Secondary | ICD-10-CM | POA: Diagnosis not present

## 2016-10-06 DIAGNOSIS — I10 Essential (primary) hypertension: Secondary | ICD-10-CM | POA: Diagnosis not present

## 2016-10-06 DIAGNOSIS — E1121 Type 2 diabetes mellitus with diabetic nephropathy: Secondary | ICD-10-CM | POA: Diagnosis not present

## 2016-10-06 DIAGNOSIS — E1165 Type 2 diabetes mellitus with hyperglycemia: Secondary | ICD-10-CM | POA: Diagnosis not present

## 2016-10-06 DIAGNOSIS — Z7984 Long term (current) use of oral hypoglycemic drugs: Secondary | ICD-10-CM | POA: Diagnosis not present

## 2016-10-06 DIAGNOSIS — J449 Chronic obstructive pulmonary disease, unspecified: Secondary | ICD-10-CM | POA: Diagnosis not present

## 2016-10-06 DIAGNOSIS — M179 Osteoarthritis of knee, unspecified: Secondary | ICD-10-CM | POA: Diagnosis not present

## 2016-10-06 DIAGNOSIS — N183 Chronic kidney disease, stage 3 (moderate): Secondary | ICD-10-CM | POA: Diagnosis not present

## 2016-10-06 DIAGNOSIS — I5030 Unspecified diastolic (congestive) heart failure: Secondary | ICD-10-CM | POA: Diagnosis not present

## 2016-10-10 ENCOUNTER — Ambulatory Visit (INDEPENDENT_AMBULATORY_CARE_PROVIDER_SITE_OTHER): Payer: Medicare Other | Admitting: Pulmonary Disease

## 2016-10-10 ENCOUNTER — Ambulatory Visit (INDEPENDENT_AMBULATORY_CARE_PROVIDER_SITE_OTHER)
Admission: RE | Admit: 2016-10-10 | Discharge: 2016-10-10 | Disposition: A | Discharge status: Home / Self Care | Payer: Medicare Other | Source: Ambulatory Visit | Attending: Pulmonary Disease | Admitting: Pulmonary Disease

## 2016-10-10 ENCOUNTER — Encounter: Payer: Self-pay | Admitting: Pulmonary Disease

## 2016-10-10 VITALS — BP 138/86 | HR 62 | Ht 63.0 in

## 2016-10-10 DIAGNOSIS — G4733 Obstructive sleep apnea (adult) (pediatric): Secondary | ICD-10-CM

## 2016-10-10 DIAGNOSIS — J948 Other specified pleural conditions: Secondary | ICD-10-CM

## 2016-10-10 DIAGNOSIS — R918 Other nonspecific abnormal finding of lung field: Secondary | ICD-10-CM | POA: Diagnosis not present

## 2016-10-10 DIAGNOSIS — J949 Pleural condition, unspecified: Secondary | ICD-10-CM | POA: Diagnosis not present

## 2016-10-10 DIAGNOSIS — J9611 Chronic respiratory failure with hypoxia: Secondary | ICD-10-CM

## 2016-10-10 DIAGNOSIS — I10 Essential (primary) hypertension: Secondary | ICD-10-CM

## 2016-10-10 DIAGNOSIS — I700 Atherosclerosis of aorta: Secondary | ICD-10-CM | POA: Diagnosis not present

## 2016-10-10 NOTE — Patient Instructions (Signed)
CXR today  Evaluate for portable concentrator  STOP taking lisinopril If BP remains higher than 140 , contact Dr gates for alternative medication

## 2016-10-10 NOTE — Progress Notes (Signed)
   Subjective:    Patient ID: Makayla Rodriguez, female    DOB: [birth year], 83 y.o.   MRN: [id number]  HPI  83 y.o [place] female with OSA/ OHS for FU.  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 Follow-up    6 month follow up. Uses [facility] as DME.    Accompanied by caregiver , remains in a wheelchair  On 2l/m daytime  and 3l/m Blended into CPAP at night. She is compliant with CPAP and oxygen Wishes that she could've portable oxygen  He states that her voice has been hoarse for many months, she is on Zyrtec/Allegra, Flonase and Astelin nasal spray for seasonal allergies She also reports throat irritation and constant throat clearing. I note on her medication list that she is on lisinopril for diabetic kidneys She has humidification on oxygen as well as CPAP    Significant tests/ events  Admitted 04-2012 with mild resp acidosis &CXR s/o edema. She was aggressive diuresed -about 15L and Placed on 24 h O2. She has OHS by ABGs with a PH of 7.4 and PCO2 of 77 and bicarb of 47.4. She has remained O2 dependent.    PSG 06/2012 >>AHI 98.7, SpO2 low 58%. Study done with 2 liters oxygen.  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 Also denies presyncope, palpitations, heartburn, abdominal pain, nausea, vomiting, diarrhea or change in bowel or urinary habits, dysuria,hematuria, rash, arthralgias, visual complaints, headache, numbness weakness or ataxia.     Objective:   Physical Exam  Gen. Pleasant, obese, in no distress ENT - no lesions, no post nasal drip Neck: No JVD, no thyromegaly, no carotid bruits Lungs: no use of accessory muscles, no dullness to percussion, decreased without rales or rhonchi  Cardiovascular: Rhythm regular, heart sounds  normal, no murmurs or gallops, no peripheral edema Musculoskeletal: No deformities, no cyanosis or clubbing , no tremors        Assessment & Plan:

## 2016-10-10 NOTE — Assessment & Plan Note (Signed)
STOP taking lisinopril If BP remains higher than 140 , contact Dr gates for alternative medication

## 2016-10-10 NOTE — Assessment & Plan Note (Signed)
Chest xray today.

## 2016-10-10 NOTE — Assessment & Plan Note (Signed)
Continue CPAP 9 cm with 3 L oxygen 100 and.  Weight loss encouraged, compliance with goal of at least 4-6 hrs every night is the expectation. Advised against medications with sedative side effects Cautioned against driving when sleepy - understanding that sleepiness will vary on a day to day basis

## 2016-10-10 NOTE — Assessment & Plan Note (Signed)
Evaluate for portable concentrator

## 2016-10-12 DIAGNOSIS — Z7984 Long term (current) use of oral hypoglycemic drugs: Secondary | ICD-10-CM | POA: Diagnosis not present

## 2016-10-12 DIAGNOSIS — E1121 Type 2 diabetes mellitus with diabetic nephropathy: Secondary | ICD-10-CM | POA: Diagnosis not present

## 2016-10-12 DIAGNOSIS — E1165 Type 2 diabetes mellitus with hyperglycemia: Secondary | ICD-10-CM | POA: Diagnosis not present

## 2016-10-12 DIAGNOSIS — J9612 Chronic respiratory failure with hypercapnia: Secondary | ICD-10-CM | POA: Diagnosis not present

## 2016-10-12 DIAGNOSIS — J449 Chronic obstructive pulmonary disease, unspecified: Secondary | ICD-10-CM | POA: Diagnosis not present

## 2016-10-12 DIAGNOSIS — I890 Lymphedema, not elsewhere classified: Secondary | ICD-10-CM | POA: Diagnosis not present

## 2016-10-12 DIAGNOSIS — E7800 Pure hypercholesterolemia, unspecified: Secondary | ICD-10-CM | POA: Diagnosis not present

## 2016-10-12 DIAGNOSIS — I5030 Unspecified diastolic (congestive) heart failure: Secondary | ICD-10-CM | POA: Diagnosis not present

## 2016-10-12 DIAGNOSIS — I10 Essential (primary) hypertension: Secondary | ICD-10-CM | POA: Diagnosis not present

## 2016-11-14 DIAGNOSIS — J449 Chronic obstructive pulmonary disease, unspecified: Secondary | ICD-10-CM | POA: Diagnosis not present

## 2016-11-14 DIAGNOSIS — I10 Essential (primary) hypertension: Secondary | ICD-10-CM | POA: Diagnosis not present

## 2016-11-14 DIAGNOSIS — M179 Osteoarthritis of knee, unspecified: Secondary | ICD-10-CM | POA: Diagnosis not present

## 2016-11-14 DIAGNOSIS — N183 Chronic kidney disease, stage 3 (moderate): Secondary | ICD-10-CM | POA: Diagnosis not present

## 2016-11-14 DIAGNOSIS — E1165 Type 2 diabetes mellitus with hyperglycemia: Secondary | ICD-10-CM | POA: Diagnosis not present

## 2016-11-14 DIAGNOSIS — E1121 Type 2 diabetes mellitus with diabetic nephropathy: Secondary | ICD-10-CM | POA: Diagnosis not present

## 2016-12-19 ENCOUNTER — Encounter (HOSPITAL_COMMUNITY): Payer: Self-pay | Admitting: Neurology

## 2016-12-19 ENCOUNTER — Inpatient Hospital Stay (HOSPITAL_COMMUNITY)
Admission: EM | Admit: 2016-12-19 | Discharge: 2016-12-29 | DRG: 291 | Disposition: A | Discharge status: Other Acute Inpatient Hospital | Payer: Medicare Other | Attending: Family Medicine | Admitting: Family Medicine

## 2016-12-19 ENCOUNTER — Emergency Department (HOSPITAL_COMMUNITY): Payer: Medicare Other

## 2016-12-19 ENCOUNTER — Observation Stay (HOSPITAL_BASED_OUTPATIENT_CLINIC_OR_DEPARTMENT_OTHER): Payer: Medicare Other

## 2016-12-19 DIAGNOSIS — E785 Hyperlipidemia, unspecified: Secondary | ICD-10-CM | POA: Diagnosis not present

## 2016-12-19 DIAGNOSIS — Z9981 Dependence on supplemental oxygen: Secondary | ICD-10-CM

## 2016-12-19 DIAGNOSIS — J9622 Acute and chronic respiratory failure with hypercapnia: Secondary | ICD-10-CM | POA: Diagnosis not present

## 2016-12-19 DIAGNOSIS — I130 Hypertensive heart and chronic kidney disease with heart failure and stage 1 through stage 4 chronic kidney disease, or unspecified chronic kidney disease: Secondary | ICD-10-CM | POA: Diagnosis not present

## 2016-12-19 DIAGNOSIS — E1122 Type 2 diabetes mellitus with diabetic chronic kidney disease: Secondary | ICD-10-CM | POA: Diagnosis present

## 2016-12-19 DIAGNOSIS — Z7982 Long term (current) use of aspirin: Secondary | ICD-10-CM

## 2016-12-19 DIAGNOSIS — E876 Hypokalemia: Secondary | ICD-10-CM | POA: Diagnosis not present

## 2016-12-19 DIAGNOSIS — I350 Nonrheumatic aortic (valve) stenosis: Secondary | ICD-10-CM | POA: Diagnosis present

## 2016-12-19 DIAGNOSIS — Z9989 Dependence on other enabling machines and devices: Secondary | ICD-10-CM

## 2016-12-19 DIAGNOSIS — Z515 Encounter for palliative care: Secondary | ICD-10-CM | POA: Diagnosis not present

## 2016-12-19 DIAGNOSIS — I10 Essential (primary) hypertension: Secondary | ICD-10-CM | POA: Diagnosis not present

## 2016-12-19 DIAGNOSIS — I5033 Acute on chronic diastolic (congestive) heart failure: Secondary | ICD-10-CM | POA: Diagnosis present

## 2016-12-19 DIAGNOSIS — I5031 Acute diastolic (congestive) heart failure: Secondary | ICD-10-CM | POA: Diagnosis not present

## 2016-12-19 DIAGNOSIS — J9602 Acute respiratory failure with hypercapnia: Secondary | ICD-10-CM

## 2016-12-19 DIAGNOSIS — J9621 Acute and chronic respiratory failure with hypoxia: Secondary | ICD-10-CM | POA: Diagnosis not present

## 2016-12-19 DIAGNOSIS — E662 Morbid (severe) obesity with alveolar hypoventilation: Secondary | ICD-10-CM | POA: Diagnosis present

## 2016-12-19 DIAGNOSIS — J9690 Respiratory failure, unspecified, unspecified whether with hypoxia or hypercapnia: Secondary | ICD-10-CM

## 2016-12-19 DIAGNOSIS — Z888 Allergy status to other drugs, medicaments and biological substances status: Secondary | ICD-10-CM

## 2016-12-19 DIAGNOSIS — Z9049 Acquired absence of other specified parts of digestive tract: Secondary | ICD-10-CM

## 2016-12-19 DIAGNOSIS — Z880 Allergy status to penicillin: Secondary | ICD-10-CM

## 2016-12-19 DIAGNOSIS — I342 Nonrheumatic mitral (valve) stenosis: Secondary | ICD-10-CM | POA: Diagnosis not present

## 2016-12-19 DIAGNOSIS — I509 Heart failure, unspecified: Secondary | ICD-10-CM

## 2016-12-19 DIAGNOSIS — Z853 Personal history of malignant neoplasm of breast: Secondary | ICD-10-CM

## 2016-12-19 DIAGNOSIS — F329 Major depressive disorder, single episode, unspecified: Secondary | ICD-10-CM | POA: Diagnosis present

## 2016-12-19 DIAGNOSIS — Z01818 Encounter for other preprocedural examination: Secondary | ICD-10-CM

## 2016-12-19 DIAGNOSIS — N183 Chronic kidney disease, stage 3 (moderate): Secondary | ICD-10-CM | POA: Diagnosis present

## 2016-12-19 DIAGNOSIS — G9341 Metabolic encephalopathy: Secondary | ICD-10-CM | POA: Diagnosis not present

## 2016-12-19 DIAGNOSIS — C50419 Malignant neoplasm of upper-outer quadrant of unspecified female breast: Secondary | ICD-10-CM | POA: Diagnosis present

## 2016-12-19 DIAGNOSIS — E118 Type 2 diabetes mellitus with unspecified complications: Secondary | ICD-10-CM

## 2016-12-19 DIAGNOSIS — Z8249 Family history of ischemic heart disease and other diseases of the circulatory system: Secondary | ICD-10-CM

## 2016-12-19 DIAGNOSIS — J9611 Chronic respiratory failure with hypoxia: Secondary | ICD-10-CM | POA: Diagnosis not present

## 2016-12-19 DIAGNOSIS — K219 Gastro-esophageal reflux disease without esophagitis: Secondary | ICD-10-CM | POA: Diagnosis present

## 2016-12-19 DIAGNOSIS — R0682 Tachypnea, not elsewhere classified: Secondary | ICD-10-CM | POA: Diagnosis not present

## 2016-12-19 DIAGNOSIS — E1129 Type 2 diabetes mellitus with other diabetic kidney complication: Secondary | ICD-10-CM | POA: Insufficient documentation

## 2016-12-19 DIAGNOSIS — Z66 Do not resuscitate: Secondary | ICD-10-CM | POA: Diagnosis present

## 2016-12-19 DIAGNOSIS — Z7189 Other specified counseling: Secondary | ICD-10-CM

## 2016-12-19 DIAGNOSIS — R0602 Shortness of breath: Secondary | ICD-10-CM

## 2016-12-19 DIAGNOSIS — Z6841 Body Mass Index (BMI) 40.0 and over, adult: Secondary | ICD-10-CM

## 2016-12-19 DIAGNOSIS — M19079 Primary osteoarthritis, unspecified ankle and foot: Secondary | ICD-10-CM | POA: Diagnosis present

## 2016-12-19 DIAGNOSIS — Z79899 Other long term (current) drug therapy: Secondary | ICD-10-CM

## 2016-12-19 DIAGNOSIS — J948 Other specified pleural conditions: Secondary | ICD-10-CM | POA: Diagnosis present

## 2016-12-19 DIAGNOSIS — J9601 Acute respiratory failure with hypoxia: Secondary | ICD-10-CM | POA: Diagnosis present

## 2016-12-19 DIAGNOSIS — Z0189 Encounter for other specified special examinations: Secondary | ICD-10-CM

## 2016-12-19 DIAGNOSIS — R627 Adult failure to thrive: Secondary | ICD-10-CM | POA: Diagnosis present

## 2016-12-19 DIAGNOSIS — Z87891 Personal history of nicotine dependence: Secondary | ICD-10-CM

## 2016-12-19 DIAGNOSIS — Z885 Allergy status to narcotic agent status: Secondary | ICD-10-CM

## 2016-12-19 DIAGNOSIS — F419 Anxiety disorder, unspecified: Secondary | ICD-10-CM | POA: Diagnosis present

## 2016-12-19 DIAGNOSIS — E119 Type 2 diabetes mellitus without complications: Secondary | ICD-10-CM

## 2016-12-19 DIAGNOSIS — D638 Anemia in other chronic diseases classified elsewhere: Secondary | ICD-10-CM | POA: Diagnosis present

## 2016-12-19 DIAGNOSIS — E872 Acidosis: Secondary | ICD-10-CM | POA: Diagnosis not present

## 2016-12-19 LAB — TSH: TSH: 1.662 u[IU]/mL (ref 0.350–4.500)

## 2016-12-19 LAB — COMPREHENSIVE METABOLIC PANEL
ALT: 10 U/L — AB (ref 14–54)
AST: 13 U/L — ABNORMAL LOW (ref 15–41)
Albumin: 3.4 g/dL — ABNORMAL LOW (ref 3.5–5.0)
Alkaline Phosphatase: 39 U/L (ref 38–126)
Anion gap: 8 (ref 5–15)
BUN: 34 mg/dL — ABNORMAL HIGH (ref 6–20)
CO2: 38 mmol/L — ABNORMAL HIGH (ref 22–32)
CREATININE: 0.92 mg/dL (ref 0.44–1.00)
Calcium: 9.5 mg/dL (ref 8.9–10.3)
Chloride: 95 mmol/L — ABNORMAL LOW (ref 101–111)
GFR calc Af Amer: >60 mL/min (ref >60)
GFR, EST NON AFRICAN AMERICAN: 56 mL/min — AB (ref >60)
Glucose, Bld: 167 mg/dL — ABNORMAL HIGH (ref 65–99)
Potassium: 4.4 mmol/L (ref 3.5–5.1)
Sodium: 141 mmol/L (ref 135–145)
TOTAL PROTEIN: 6.8 g/dL (ref 6.5–8.1)
Total Bilirubin: 0.7 mg/dL (ref 0.3–1.2)

## 2016-12-19 LAB — PROTIME-INR
INR: 1.1
PROTHROMBIN TIME: 14.3 s (ref 11.4–15.2)

## 2016-12-19 LAB — ECHOCARDIOGRAM COMPLETE
HEIGHTINCHES: 63 in
WEIGHTICAEL: 4800 [oz_av]

## 2016-12-19 LAB — IRON AND TIBC
IRON: 24 ug/dL — AB (ref 28–170)
Saturation Ratios: 7 % — ABNORMAL LOW (ref 10.4–31.8)
TIBC: 339 ug/dL (ref 250–450)
UIBC: 315 ug/dL

## 2016-12-19 LAB — CBC WITH DIFFERENTIAL/PLATELET
Basophils Absolute: 0 10*3/uL (ref 0.0–0.1)
Basophils Relative: 0 %
Eosinophils Absolute: 0.2 10*3/uL (ref 0.0–0.7)
Eosinophils Relative: 3 %
HCT: 34.8 % — ABNORMAL LOW (ref 36.0–46.0)
Hemoglobin: 10.4 g/dL — ABNORMAL LOW (ref 12.0–15.0)
LYMPHS ABS: 1.7 10*3/uL (ref 0.7–4.0)
LYMPHS PCT: 20 %
MCH: 28.9 pg (ref 26.0–34.0)
MCHC: 29.9 g/dL — ABNORMAL LOW (ref 30.0–36.0)
MCV: 96.7 fL (ref 78.0–100.0)
Monocytes Absolute: 0.8 10*3/uL (ref 0.1–1.0)
Monocytes Relative: 9 %
Neutro Abs: 5.8 10*3/uL (ref 1.7–7.7)
Neutrophils Relative %: 68 %
Platelets: 174 10*3/uL (ref 150–400)
RBC: 3.6 MIL/uL — AB (ref 3.87–5.11)
RDW: 12.4 % (ref 11.5–15.5)
WBC: 8.6 10*3/uL (ref 4.0–10.5)

## 2016-12-19 LAB — FERRITIN: Ferritin: 42 ng/mL (ref 11–307)

## 2016-12-19 LAB — TROPONIN I: Troponin I: 0.05 ng/mL (ref <0.03)

## 2016-12-19 LAB — GLUCOSE, CAPILLARY
GLUCOSE-CAPILLARY: 158 mg/dL — AB (ref 65–99)
Glucose-Capillary: 157 mg/dL — ABNORMAL HIGH (ref 65–99)
Glucose-Capillary: 220 mg/dL — ABNORMAL HIGH (ref 65–99)

## 2016-12-19 LAB — RETICULOCYTES
RBC.: 3.91 MIL/uL (ref 3.87–5.11)
RETIC COUNT ABSOLUTE: 58.7 10*3/uL (ref 19.0–186.0)
Retic Ct Pct: 1.5 % (ref 0.4–3.1)

## 2016-12-19 LAB — FOLATE: Folate: 86.4 ng/mL (ref >5.9)

## 2016-12-19 LAB — VITAMIN B12: VITAMIN B 12: 617 pg/mL (ref 180–914)

## 2016-12-19 LAB — I-STAT TROPONIN, ED: TROPONIN I, POC: 0.02 ng/mL (ref 0.00–0.08)

## 2016-12-19 LAB — BRAIN NATRIURETIC PEPTIDE: B NATRIURETIC PEPTIDE 5: 431.8 pg/mL — AB (ref 0.0–100.0)

## 2016-12-19 MED ORDER — SODIUM CHLORIDE 0.9% FLUSH
3.0000 mL | Freq: Two times a day (BID) | INTRAVENOUS | Status: DC
  Administered 2016-12-19 – 2016-12-29 (×21): 3 mL via INTRAVENOUS

## 2016-12-19 MED ORDER — ATORVASTATIN CALCIUM 10 MG PO TABS
10.0000 mg | ORAL_TABLET | Freq: Every day | ORAL | Status: DC
  Administered 2016-12-19 – 2016-12-20 (×2): 10 mg via ORAL
  Filled 2016-12-19 (×2): qty 1

## 2016-12-19 MED ORDER — SODIUM CHLORIDE 0.9 % IV SOLN: 250.0000 mL | INTRAVENOUS | Status: DC | PRN

## 2016-12-19 MED ORDER — FUROSEMIDE 10 MG/ML IJ SOLN
40.0000 mg | Freq: Two times a day (BID) | INTRAMUSCULAR | Status: DC
  Administered 2016-12-19 – 2016-12-24 (×11): 40 mg via INTRAVENOUS
  Filled 2016-12-19 (×12): qty 4

## 2016-12-19 MED ORDER — ENOXAPARIN SODIUM 40 MG/0.4ML ~~LOC~~ SOLN
40.0000 mg | SUBCUTANEOUS | Status: DC
  Administered 2016-12-19 – 2016-12-28 (×10): 40 mg via SUBCUTANEOUS
  Filled 2016-12-19 (×10): qty 0.4

## 2016-12-19 MED ORDER — TORSEMIDE 20 MG PO TABS: 20.0000 mg | ORAL_TABLET | Freq: Every day | ORAL | Status: DC

## 2016-12-19 MED ORDER — ASPIRIN EC 81 MG PO TBEC
81.0000 mg | DELAYED_RELEASE_TABLET | Freq: Every morning | ORAL | Status: DC
  Administered 2016-12-19 – 2016-12-29 (×11): 81 mg via ORAL
  Filled 2016-12-19 (×11): qty 1

## 2016-12-19 MED ORDER — FLUTICASONE PROPIONATE 50 MCG/ACT NA SUSP
1.0000 | Freq: Every day | NASAL | Status: DC
  Administered 2016-12-19 – 2016-12-29 (×9): 1 via NASAL
  Filled 2016-12-19 (×3): qty 16

## 2016-12-19 MED ORDER — DOCUSATE SODIUM 100 MG PO CAPS
100.0000 mg | ORAL_CAPSULE | Freq: Every day | ORAL | Status: DC | PRN
  Filled 2016-12-19: qty 1

## 2016-12-19 MED ORDER — POLYETHYLENE GLYCOL 3350 17 G PO PACK
17.0000 g | PACK | Freq: Every day | ORAL | Status: DC | PRN
  Administered 2016-12-28 – 2016-12-29 (×2): 17 g via ORAL
  Filled 2016-12-19 (×2): qty 1

## 2016-12-19 MED ORDER — ACETAMINOPHEN 325 MG PO TABS
650.0000 mg | ORAL_TABLET | ORAL | Status: DC | PRN
  Administered 2016-12-19: 650 mg via ORAL
  Filled 2016-12-19: qty 2

## 2016-12-19 MED ORDER — ONDANSETRON HCL 4 MG/2ML IJ SOLN: 4.0000 mg | Freq: Four times a day (QID) | INTRAMUSCULAR | Status: DC | PRN

## 2016-12-19 MED ORDER — FUROSEMIDE 10 MG/ML IJ SOLN
40.0000 mg | Freq: Once | INTRAMUSCULAR | Status: AC
  Administered 2016-12-19: 40 mg via INTRAVENOUS
  Filled 2016-12-19: qty 4

## 2016-12-19 MED ORDER — PANTOPRAZOLE SODIUM 40 MG PO TBEC
40.0000 mg | DELAYED_RELEASE_TABLET | Freq: Every morning | ORAL | Status: DC
  Administered 2016-12-19: 40 mg via ORAL
  Filled 2016-12-19: qty 1

## 2016-12-19 MED ORDER — IPRATROPIUM-ALBUTEROL 0.5-2.5 (3) MG/3ML IN SOLN: 3.0000 mL | RESPIRATORY_TRACT | Status: DC | PRN

## 2016-12-19 MED ORDER — INSULIN ASPART 100 UNIT/ML ~~LOC~~ SOLN
0.0000 [IU] | Freq: Three times a day (TID) | SUBCUTANEOUS | Status: DC
  Administered 2016-12-19 (×2): 2 [IU] via SUBCUTANEOUS

## 2016-12-19 MED ORDER — AZELASTINE HCL 0.1 % NA SOLN
1.0000 | Freq: Two times a day (BID) | NASAL | Status: DC
  Administered 2016-12-19 – 2016-12-29 (×18): 1 via NASAL
  Filled 2016-12-19 (×4): qty 30

## 2016-12-19 MED ORDER — NEBIVOLOL HCL 5 MG PO TABS
5.0000 mg | ORAL_TABLET | Freq: Every day | ORAL | Status: DC
  Administered 2016-12-19 – 2016-12-20 (×2): 5 mg via ORAL
  Filled 2016-12-19 (×2): qty 1

## 2016-12-19 MED ORDER — SERTRALINE HCL 50 MG PO TABS
75.0000 mg | ORAL_TABLET | Freq: Every day | ORAL | Status: DC
  Administered 2016-12-19 – 2016-12-20 (×2): 75 mg via ORAL
  Filled 2016-12-19 (×2): qty 1

## 2016-12-19 NOTE — ED Notes (Signed)
Pure wick external urinary catheter applied. Pt in soiled urine brief, linen change, reposition in bed.

## 2016-12-19 NOTE — H&P (Signed)
History and Physical    Makayla Rodriguez MPN:[id number] DOB: 05/11/1932 DOA: 12/19/2016   PCP: Darcus Austin, MD   Patient coming from:  Home    Chief Complaint: Shortness of breath   HPI: Makayla Rodriguez is a 83 y.o. female with medical history significant for HTN, HLD,DM, OSA, pleural mass followed by pulmonary as OP, remote breast cancer, Chronic respiratory failure on 2 L O2 and on 3 L at night, presenting to the ED with increasing shortness of breath since las evening. She reports increased peripheral edema after having food indiscretion last Saturday night, including Salami. She has not been very compliant with meds over the last week. She has not been seen by a cardiologist since 2015  Denies productive  sputum. Denies rhinorrhea or hemoptysis. Denies fevers, chills, night sweats or mucositis. Denies any chest pain, chest wall pain or palpitations. Denies any abdominal pain. Has decreased appetite due to current symptoms.Denies nausea or vomiting. Denies dizziness or vertigo. No confusion was reported. Denies any vision changes, double vision or headaches.    ED Course:  BP (!) 158/98   Pulse 73   Temp 98.6 F (37 C) (Oral)   Resp (!) 28   Ht 5\' 3"  (1.6 m)   Wt (S) 136.1 kg (300 lb) Comment: reports she hasn't been able to stand for a weight  SpO2 97%   BMI 53.14 kg/m    sodium 141 potassium 4.4 creatinine 0.92 BNP 431.8 troponin 0.02 white count 8.6 hemoglobin 10.4 platelets 174 glucose 167 chest x-ray shows mild pleural effusion on the left EKG sinus rhythm, no significant changes from prior.   Echo 2015: The cavity size was normal. Wall thickness wasincreased in a pattern of moderate LVH. Systolic function wasnormal.EF 50%to 55%.     Review of Systems:  As per HPI otherwise all other systems reviewed and are negative  Past Medical History:  Diagnosis Date  . Anxiety   . Arthritis   . CHF (congestive heart failure) ([place])    12/13 admission  . CKD (chronic kidney  disease), stage III   . Complication of anesthesia    O2 sat drop during  and went into coma  . Diabetes mellitus without complication ([place])   . GERD (gastroesophageal reflux disease)   . Hypertension   . Obesity   . Obesity hypoventilation syndrome ([place])    uses O2 at home  . Seasonal allergies   . Shortness of breath    with annxiety attack  . Sleep apnea    CPAP    Past Surgical History:  Procedure Laterality Date  .  fybroid tumors    . BREAST SURGERY    . CHOLECYSTECTOMY    . EYE SURGERY     cataracts  . HERNIA REPAIR     umbilical  . PARTIAL MASTECTOMY WITH NEEDLE LOCALIZATION Left 04/05/2013   Procedure: PARTIAL MASTECTOMY WITH NEEDLE LOCALIZATION;  Surgeon: Adin Hector, MD;  Location: [facility];  Service: General;  Laterality: Left;  . PILONIDAL CYST EXCISION     buttock  . TONSILLECTOMY      Social History Social History   Social History  . Marital status: Single    Spouse name: N/A  . Number of children: 0  . Years of education: N/A   Occupational History  . Retired     Public relations account executive   Social History Main Topics  . Smoking status: Former Smoker    Packs/day: 0.50    Years: 40.00    Types:  Cigarettes    Quit date: 05/30/1980  . Smokeless tobacco: Never Used  . Alcohol use No  . Drug use: No  . Sexual activity: No   Other Topics Concern  . Not on file   Social History Narrative   Never married   No children     Allergies  Allergen Reactions  . Demerol [Meperidine] Anaphylaxis    Reaction: Hypoventilation after surgery  . Penicillins Anaphylaxis and Swelling    Has patient had a PCN reaction causing immediate rash, facial/tongue/throat swelling, SOB or lightheadedness with hypotension: Yes Has patient had a PCN reaction causing severe rash involving mucus membranes or skin necrosis: No Has patient had a PCN reaction that required hospitalization: Yes Has patient had a PCN reaction occurring within the last 10 years: No If all  of the above answers are "NO", then may proceed with Cephalosporin use.   Marland Kitchen Phenergan [Promethazine Hcl] Anaphylaxis    Reaction: hypoventilation  . Codeine Nausea And Vomiting  . Macrobid [Nitrofurantoin] Nausea And Vomiting  . Relafen [Nabumetone] Itching and Swelling    Family History  Problem Relation Age of Onset  . Heart failure Mother   . Gallbladder disease Father   . Pancreatic cancer Sister       Prior to Admission medications   Medication Sig Start Date End Date Taking? Authorizing Provider  acetaminophen (TYLENOL) 500 MG tablet Take 500 mg by mouth every 6 (six) hours as needed.    [provider]  albuterol (PROAIR HFA) 108 (90 Base) MCG/ACT inhaler Inhale 2 puffs into the lungs every 6 (six) hours as needed for wheezing or shortness of breath. 04/13/16   Rigoberto Noel, MD  albuterol (PROVENTIL HFA;VENTOLIN HFA) 108 (90 BASE) MCG/ACT inhaler Inhale 2 puffs into the lungs every 4 (four) hours as needed for wheezing or shortness of breath. 10/03/13   Domenic Polite, MD  aspirin EC 81 MG tablet Take 81 mg by mouth every morning.    [provider]  atorvastatin (LIPITOR) 10 MG tablet Take 10 mg by mouth daily. Reported on 05/14/2015 07/22/14   [provider]  azelastine (ASTELIN) 0.1 % nasal spray Place 2 sprays into both nostrils 2 (two) times daily. Use in each nostril as directed 04/16/14   Donita Brooks, NP  cetirizine (ZYRTEC) 10 MG tablet Take 10 mg by mouth daily. Reported on 05/14/2015    [provider]  cholecalciferol (VITAMIN D) 1000 UNITS tablet Take 1,000 Units by mouth every morning. Reported on 05/14/2015    [provider]  Chromium-Cinnamon [phone] MCG-MG CAPS Take 1 tablet by mouth at bedtime.     [provider]  Dextromethorphan-Guaifenesin (TUSSIN DM PO) Take 5 mLs by mouth as needed (for cough).     [provider]  diclofenac sodium (VOLTAREN) 1 % GEL Apply 4 g topically 2 (two) times  daily as needed (for joint pain). For joint pain    [provider]  Dietary Management Product (TOZAL PO) Take 3 tablets by mouth daily. For eyes    [provider]  docusate sodium (COLACE) 100 MG capsule Take 100-200 mg by mouth at bedtime as needed for constipation.     [provider]  fexofenadine (ALLEGRA) 180 MG tablet Take 180 mg by mouth daily. Reported on 06/12/2015    [provider]  fluticasone (FLONASE) 50 MCG/ACT nasal spray Place 2 sprays into the nose 2 (two) times daily.     [provider]  glipiZIDE (GLUCOTROL  XL) 10 MG 24 hr tablet Take 10 mg by mouth daily with breakfast.    [provider]  hydroxypropyl methylcellulose (ISOPTO TEARS) 2.5 % ophthalmic solution Place 1 drop into both eyes 3 (three) times daily as needed (for dry eyes).    [provider]  lisinopril (PRINIVIL,ZESTRIL) 5 MG tablet Take 5 mg by mouth daily.    [provider]  nebivolol (BYSTOLIC) 5 MG tablet Take 5 mg by mouth daily. 05/20/12   Barton Dubois, MD  ONGLYZA 5 MG TABS tablet 5 mg daily.  10/11/13   [provider]  pantoprazole (PROTONIX) 40 MG tablet Take 40 mg by mouth every morning.    [provider]  polyethylene glycol (MIRALAX / GLYCOLAX) packet Take 17 g by mouth daily as needed (for constipation).    [provider]  sertraline (ZOLOFT) 50 MG tablet Take 25-50 mg by mouth daily. For the first 6 days take half tab. Then a full tab as of 04-17-14    [provider]  sodium chloride (OCEAN) 0.65 % SOLN nasal spray Place 1 spray into both nostrils as needed for congestion. 04/16/14   Donita Brooks, NP  torsemide (DEMADEX) 20 MG tablet Take 20 mg by mouth daily.    [provider]  traMADol (ULTRAM) 50 MG tablet Take 50 mg by mouth 3 (three) times daily as needed (for pain.).  05/20/12   Barton Dubois, MD    Physical Exam:  Vitals:   12/19/16 0719 12/19/16 0800 12/19/16  0845 12/19/16 0915  BP: (!) 144/71 (!) 172/57 (!) 149/68 (!) 158/98  Pulse: 74  93 73  Resp: 19  (!) 31 (!) 28  Temp: 98.6 F (37 C)     TempSrc: Oral     SpO2: 96%  98% 97%  Weight:      Height:       Constitutional: NAD, calm, comfortable  Eyes: PERRL, lids and conjunctivae normal ENMT: Mucous membranes are moist, without exudate or lesions  Neck: normal, supple, no masses, no thyromegaly Respiratory: clear to auscultation bilaterally, no wheezing, no crackles. Normal respiratory effort  Cardiovascular: Regular rate and rhythm, no murmurs, rubs or gallops.2+ bilateral lower extremity edema along with chronic lymphedema. 2+ pedal pulses. No carotid bruits.  Abdomen:  Morbidly obese Soft, non tender, No hepatosplenomegaly. Bowel sounds positive.  Musculoskeletal: no clubbing / cyanosis. Moves all extremities Skin: no jaundice, No lesions.  Neurologic: Sensation intact  Strength equal in all extremities Psychiatric:   Alert and oriented x 3. Normal mood.     Labs on Admission: I have personally reviewed following labs and imaging studies  CBC:  Recent Labs Lab 12/19/16 0728  WBC 8.6  NEUTROABS 5.8  HGB 10.4*  HCT 34.8*  MCV 96.7  PLT 638    Basic Metabolic Panel:  Recent Labs Lab 12/19/16 0728  NA 141  K 4.4  CL 95*  CO2 38*  GLUCOSE 167*  BUN 34*  CREATININE 0.92  CALCIUM 9.5    GFR: Estimated Creatinine Clearance: 62.8 mL/min (by C-G formula based on SCr of 0.92 mg/dL).  Liver Function Tests:  Recent Labs Lab 12/19/16 0728  AST 13*  ALT 10*  ALKPHOS 39  BILITOT 0.7  PROT 6.8  ALBUMIN 3.4*   No results for input(s): LIPASE, AMYLASE in the last 168 hours. No results for input(s): AMMONIA in the last 168 hours.  Coagulation Profile: No results for input(s): INR, PROTIME in the last 168 hours.  Cardiac Enzymes:  No results for input(s): CKTOTAL, CKMB, CKMBINDEX, TROPONINI in the last 168 hours.  BNP (last 3 results) No results for input(s):  PROBNP in the last 8760 hours.  HbA1C: No results for input(s): HGBA1C in the last 72 hours.  CBG: No results for input(s): GLUCAP in the last 168 hours.  Lipid Profile: No results for input(s): CHOL, HDL, LDLCALC, TRIG, CHOLHDL, LDLDIRECT in the last 72 hours.  Thyroid Function Tests: No results for input(s): TSH, T4TOTAL, FREET4, T3FREE, THYROIDAB in the last 72 hours.  Anemia Panel: No results for input(s): VITAMINB12, FOLATE, FERRITIN, TIBC, IRON, RETICCTPCT in the last 72 hours.  Urine analysis:    Component Value Date/Time   COLORURINE YELLOW 04/12/2014 1738   APPEARANCEUR CLEAR 04/12/2014 1738   LABSPEC 1.022 04/12/2014 1738   PHURINE 5.5 04/12/2014 1738   GLUCOSEU NEGATIVE 04/12/2014 1738   HGBUR SMALL (A) 04/12/2014 1738   BILIRUBINUR NEGATIVE 04/12/2014 1738   KETONESUR NEGATIVE 04/12/2014 1738   PROTEINUR 100 (A) 04/12/2014 1738   UROBILINOGEN 0.2 04/12/2014 1738   NITRITE NEGATIVE 04/12/2014 1738   LEUKOCYTESUR NEGATIVE 04/12/2014 1738    Sepsis Labs: @LABRCNTIP (procalcitonin:4,lacticidven:4) )No results found for this or any previous visit (from the past 240 hour(s)).   Radiological Exams on Admission: Dg Chest 2 View  Result Date: 12/19/2016 CLINICAL DATA:  Shortness of breath. EXAM: CHEST  2 VIEW COMPARISON:  Radiographs of Oct 10, 2016. FINDINGS: Stable cardiomediastinal silhouette. Atherosclerosis of thoracic aorta is noted. No pneumothorax is noted. Mild diffuse interstitial densities are noted throughout both lungs concerning for chronic scarring, although superimposed edema or inflammation cannot be excluded. Increased left basilar atelectasis or infiltrate is noted with mild left pleural effusion. Multilevel degenerative disc disease of thoracic spine is noted. IMPRESSION: Aortic atherosclerosis. Stable mild diffuse interstitial densities are noted throughout both lungs concerning for chronic scarring or inflammation, although superimposed acute edema or  inflammation is noted. Increased left basilar atelectasis or infiltrate is noted with mild left pleural effusion. Electronically Signed   By: Marijo Conception, M.D.   On: 12/19/2016 08:00    EKG: Independently reviewed.  Assessment/Plan Active Problems:   Acute diastolic heart failure, NYHA class 2 (HCC)   Diabetes mellitus (HCC)   OA (osteoarthritis) of ankle   Hypertension   Morbid obesity (HCC)   Pleural mass   Obesity hypoventilation syndrome (HCC)   Cancer of upper-outer quadrant of female breast ([place])   Morbid obesity with body mass index of 45.0-49.9 in adult [facility])   Chronic respiratory failure (HCC)   Acute exacerbation of CHF (congestive heart failure) (HCC)     Chronic respiratory failure likely due to Acute on chronic diastolic heart failure.  manifested with PND, orthopnea and dyspnea on exertion on presentation with weight gain per patient's report, currently 300 lbs BNP 400s . Tn 0.02 Last 2D echo in 2015  EF5--55% with normal systolic  CXR small L pleural effusion  Osats 70s in RA, patient uses O2 2L daytime, 3 L night, and CPAP   VSS  Received  40  Mg IV Lasix in ED. She reports feeling better with diuresis   Telemetry Obs     CHF order set   TSH  Cycle troponins  Daily weights and strict I/O  ECHO   Lasix 40 mg IV BID  Continue aspirin Will need OP follow up once discharged    Hypertension BP 158/98   Pulse 73 . Admits to food indiscretion and recent less adequate compliance with meds.  Continue  home anti-hypertensive medications   She will need close OP PCP follow up   Hyperlipidemia Continue home statins   Type II Diabetes Current blood sugar level is 167 Lab Results  Component Value Date   HGBA1C 7.7 (H) 04/12/2014  Hgb A1C Hold home oral diabetic medications.  SSI OP PCP follow up    Anemia of chronic disease Hemoglobin on admission 10.4 . Baseline Hb around 11  . No acute bleeding issues at  this time  Repeat CBC in am  No transfusion is  indicated at this time Anemia panel   Depression Continue home Zoloft     DVT prophylaxis: Lovenox  Code Status:   Full      Family Communication:  Discussed with patient Disposition Plan: Expect patient to be discharged to home after condition improves Consults called:    None  Admission status:Tele  Obs     Leslie Langille E, PA-C [facility]   12/19/2016, 10:25 AM

## 2016-12-19 NOTE — ED Provider Notes (Signed)
[facility] DEPT Provider Note   CSN: [id number] Arrival date & time: 12/19/16  0709     History   Chief Complaint Chief Complaint  Patient presents with  . Shortness of Breath    HPI Makayla Rodriguez is a 83 y.o. female.  Patient with history of congestive heart failure compliant with torsamide, EF 50-55% on echo in 2015, chronic oxygen use at 2 L, anxiety -- presents to the hospital today with complaint of chest congestion and shortness of breath. This has been ongoing for a week. Patient states that her home nurse aides were concerned about her breathing last night and her ability to stay on CPAP. Patient states that she was anxious and EMS was called for transport to the hospital. Right now she feels fine. She does report increasing swelling in her abdomen. Her legs are swollen but this is chronic. No fevers or cough. She feels that she would feel much better if she could clear the congestion/sputum in her chest and nose. No chest pain or abd pain. The onset of this condition was acute. The course is intermittent. Aggravating factors: none. Alleviating factors: none.        Past Medical History:  Diagnosis Date  . Anxiety   . Arthritis   . CHF (congestive heart failure) ([place])    12/13 admission  . CKD (chronic kidney disease), stage III   . Complication of anesthesia    O2 sat drop during  and went into coma  . Diabetes mellitus without complication ([place])   . GERD (gastroesophageal reflux disease)   . Hypertension   . Obesity   . Obesity hypoventilation syndrome ([place])    uses O2 at home  . Seasonal allergies   . Shortness of breath    with annxiety attack  . Sleep apnea    CPAP    Patient Active Problem List   Diagnosis Date Noted  . Chronic respiratory failure ([place]) 12/31/2014  . Morbid obesity with body mass index of 45.0-49.9 in adult [facility]) 09/20/2014  . Dyspnea 09/28/2013  . Allergic rhinitis 09/11/2013  . Cancer of upper-outer quadrant of female breast  ([place]) 03/07/2013  . OSA (obstructive sleep apnea) 08/09/2012  . Obesity hypoventilation syndrome ([place]) 08/09/2012  . Pleural mass 05/15/2012  . Acute diastolic heart failure, NYHA class 2 ([place]) 05/11/2012  . Diabetes mellitus ([place]) 05/11/2012  . OA (osteoarthritis) of ankle 05/11/2012  . Hypertension 05/11/2012  . Morbid obesity ([place]) 05/11/2012    Past Surgical History:  Procedure Laterality Date  .  fybroid tumors    . BREAST SURGERY    . CHOLECYSTECTOMY    . EYE SURGERY     cataracts  . HERNIA REPAIR     umbilical  . PARTIAL MASTECTOMY WITH NEEDLE LOCALIZATION Left 04/05/2013   Procedure: PARTIAL MASTECTOMY WITH NEEDLE LOCALIZATION;  Surgeon: Adin Hector, MD;  Location: [facility];  Service: General;  Laterality: Left;  . PILONIDAL CYST EXCISION     buttock  . TONSILLECTOMY      OB History    No data available       Home Medications    Prior to Admission medications   Medication Sig Start Date End Date Taking? Authorizing Provider  acetaminophen (TYLENOL) 500 MG tablet Take 500 mg by mouth every 6 (six) hours as needed.    [provider]  albuterol (PROAIR HFA) 108 (90 Base) MCG/ACT inhaler Inhale 2 puffs into the lungs every 6 (six) hours as needed for wheezing or shortness  of breath. 04/13/16   Rigoberto Noel, MD  albuterol (PROVENTIL HFA;VENTOLIN HFA) 108 (90 BASE) MCG/ACT inhaler Inhale 2 puffs into the lungs every 4 (four) hours as needed for wheezing or shortness of breath. 10/03/13   Domenic Polite, MD  aspirin EC 81 MG tablet Take 81 mg by mouth every morning.    [provider]  atorvastatin (LIPITOR) 10 MG tablet Take 10 mg by mouth daily. Reported on 05/14/2015 07/22/14   [provider]  azelastine (ASTELIN) 0.1 % nasal spray Place 2 sprays into both nostrils 2 (two) times daily. Use in each nostril as directed 04/16/14   Donita Brooks, NP  cetirizine (ZYRTEC) 10 MG tablet Take 10 mg by mouth daily. Reported on 05/14/2015     [provider]  cholecalciferol (VITAMIN D) 1000 UNITS tablet Take 1,000 Units by mouth every morning. Reported on 05/14/2015    [provider]  Chromium-Cinnamon [phone] MCG-MG CAPS Take 1 tablet by mouth at bedtime.     [provider]  Dextromethorphan-Guaifenesin (TUSSIN DM PO) Take 5 mLs by mouth as needed (for cough).     [provider]  diclofenac sodium (VOLTAREN) 1 % GEL Apply 4 g topically 2 (two) times daily as needed (for joint pain). For joint pain    [provider]  Dietary Management Product (TOZAL PO) Take 3 tablets by mouth daily. For eyes    [provider]  docusate sodium (COLACE) 100 MG capsule Take 100-200 mg by mouth at bedtime as needed for constipation.     [provider]  fexofenadine (ALLEGRA) 180 MG tablet Take 180 mg by mouth daily. Reported on 06/12/2015    [provider]  fluticasone (FLONASE) 50 MCG/ACT nasal spray Place 2 sprays into the nose 2 (two) times daily.     [provider]  glipiZIDE (GLUCOTROL XL) 10 MG 24 hr tablet Take 10 mg by mouth daily with breakfast.    [provider]  hydroxypropyl methylcellulose (ISOPTO TEARS) 2.5 % ophthalmic solution Place 1 drop into both eyes 3 (three) times daily as needed (for dry eyes).    [provider]  lisinopril (PRINIVIL,ZESTRIL) 5 MG tablet Take 5 mg by mouth daily.    [provider]  nebivolol (BYSTOLIC) 5 MG tablet Take 5 mg by mouth daily. 05/20/12   Barton Dubois, MD  ONGLYZA 5 MG TABS tablet 5 mg daily.  10/11/13   [provider]  pantoprazole (PROTONIX) 40 MG tablet Take 40 mg by mouth every morning.    [provider]  polyethylene glycol (MIRALAX / GLYCOLAX) packet Take 17 g by mouth daily as needed (for constipation).    [provider]  sertraline (ZOLOFT) 50 MG tablet Take 25-50 mg by mouth daily. For the first 6 days take half tab. Then a full tab as of 04-17-14     [provider]  sodium chloride (OCEAN) 0.65 % SOLN nasal spray Place 1 spray into both nostrils as needed for congestion. 04/16/14   Donita Brooks, NP  torsemide (DEMADEX) 20 MG tablet Take 20 mg by mouth daily.    [provider]  traMADol (ULTRAM) 50 MG tablet Take 50 mg by mouth 3 (three) times daily as needed (for pain.).  05/20/12   Barton Dubois, MD    Family History Family History  Problem Relation Age of Onset  . Heart failure Mother   . Gallbladder disease Father   . Pancreatic cancer Sister  Social History Social History  Substance Use Topics  . Smoking status: Former Smoker    Packs/day: 0.50    Years: 40.00    Types: Cigarettes    Quit date: 05/30/1980  . Smokeless tobacco: Never Used  . Alcohol use No     Allergies   Demerol [meperidine]; Penicillins; Phenergan [promethazine hcl]; Codeine; Macrobid [nitrofurantoin]; and Relafen [nabumetone]   Review of Systems Review of Systems  Constitutional: Negative for fever.  HENT: Positive for congestion. Negative for rhinorrhea, sinus pain, sinus pressure and sore throat.   Eyes: Negative for redness.  Respiratory: Positive for shortness of breath. Negative for cough.   Cardiovascular: Positive for leg swelling. Negative for chest pain.  Gastrointestinal: Positive for abdominal distention. Negative for abdominal pain, diarrhea, nausea and vomiting.  Genitourinary: Negative for dysuria.  Musculoskeletal: Negative for myalgias.  Skin: Negative for rash.  Neurological: Negative for headaches.     Physical Exam Updated Vital Signs BP (!) 144/71 (BP Location: Right Arm)   Pulse 74   Temp 98.6 F (37 C) (Oral)   Resp 19   Ht 5\' 3"  (1.6 m)   Wt (S) 136.1 kg (300 lb) Comment: reports she hasn't been able to stand for a weight  SpO2 96%   BMI 53.14 kg/m   Physical Exam  Constitutional: She appears well-developed and well-nourished.  HENT:  Head: Normocephalic and atraumatic.    Mouth/Throat: Oropharynx is clear and moist.  Eyes: Conjunctivae are normal. Right eye exhibits no discharge. Left eye exhibits no discharge.  Neck: Normal range of motion. Neck supple.  Cardiovascular: Normal rate and regular rhythm.   Murmur (3/6 systolic) heard. Pulmonary/Chest: Effort normal and breath sounds normal. No respiratory distress. She has no wheezes. She has no rales.  Abdominal: Soft. There is no tenderness.  Neurological: She is alert.  Skin: Skin is warm and dry.  Psychiatric: She has a normal mood and affect.  Nursing note and vitals reviewed.    ED Treatments / Results  Labs (all labs ordered are listed, but only abnormal results are displayed) Labs Reviewed  CBC WITH DIFFERENTIAL/PLATELET - Abnormal; Notable for the following:       Result Value   RBC 3.60 (*)    Hemoglobin 10.4 (*)    HCT 34.8 (*)    MCHC 29.9 (*)    All other components within normal limits  COMPREHENSIVE METABOLIC PANEL - Abnormal; Notable for the following:    Chloride 95 (*)    CO2 38 (*)    Glucose, Bld 167 (*)    BUN 34 (*)    Albumin 3.4 (*)    AST 13 (*)    ALT 10 (*)    GFR calc non Af Amer 56 (*)    All other components within normal limits  BRAIN NATRIURETIC PEPTIDE - Abnormal; Notable for the following:    B Natriuretic Peptide 431.8 (*)    All other components within normal limits  I-STAT TROPONIN, ED    EKG  EKG Interpretation  Date/Time:  Monday December 19 2016 07:18:16 EDT Ventricular Rate:  75 PR Interval:    QRS Duration: 93 QT Interval:  417 QTC Calculation: 466 R Axis:   13 Text Interpretation:  regular rate and rhythm, pacemaker not defined no significant st changes seen previous ekg  April 12, 2014 was clearly sinus Confirmed by Pattricia Boss [id number]) on 12/19/2016 7:47:08 AM Also confirmed by Pattricia Boss [phone]), editor Oswaldo Milian, [place] ([id number])  on 12/19/2016 9:34:01 AM  Radiology Dg Chest 2 View  Result Date: 12/19/2016 CLINICAL DATA:   Shortness of breath. EXAM: CHEST  2 VIEW COMPARISON:  Radiographs of Oct 10, 2016. FINDINGS: Stable cardiomediastinal silhouette. Atherosclerosis of thoracic aorta is noted. No pneumothorax is noted. Mild diffuse interstitial densities are noted throughout both lungs concerning for chronic scarring, although superimposed edema or inflammation cannot be excluded. Increased left basilar atelectasis or infiltrate is noted with mild left pleural effusion. Multilevel degenerative disc disease of thoracic spine is noted. IMPRESSION: Aortic atherosclerosis. Stable mild diffuse interstitial densities are noted throughout both lungs concerning for chronic scarring or inflammation, although superimposed acute edema or inflammation is noted. Increased left basilar atelectasis or infiltrate is noted with mild left pleural effusion. Electronically Signed   By: Marijo Conception, M.D.   On: 12/19/2016 08:00    Procedures Procedures (including critical care time)  Medications Ordered in ED Medications - No data to display   Initial Impression / Assessment and Plan / ED Course  I have reviewed the triage vital signs and the nursing notes.  Pertinent labs & imaging results that were available during my care of the patient were reviewed by me and considered in my medical decision making (see chart for details).     Patient seen and examined. Work-up initiated. Medications ordered.   Vital signs reviewed and are as follows: BP (!) 172/57   Pulse 74   Temp 98.6 F (37 C) (Oral)   Resp 19   Ht 5\' 3"  (1.6 m)   Wt (S) 136.1 kg (300 lb) Comment: reports she hasn't been able to stand for a weight  SpO2 96%   BMI 53.14 kg/m      Spoke with hospitalist who will admit for CHF exacerbation. Pt updated and agrees with plan.   Final Clinical Impressions(s) / ED Diagnoses   Final diagnoses:  Acute on chronic congestive heart failure, unspecified heart failure type (HCC)  Shortness of breath   Admit.   New  Prescriptions New Prescriptions   No medications on file     Carlisle Cater, Hershal Coria 12/19/16 1608    Pattricia Boss, MD 12/20/16 1210

## 2016-12-19 NOTE — ED Notes (Signed)
Zoll hooked to patient, and rhythm strip obtained. Given to PA.

## 2016-12-19 NOTE — Progress Notes (Signed)
Lab called critical troponin 0.05.  Pt requesting azelastine nasal spray and Flonase.   PA paged.

## 2016-12-19 NOTE — ED Triage Notes (Signed)
Per ems- pt is coming from home with hx of SOB x 1 week that is intermittent when she feels nervous. Has bee having swelling to BLE and in abdomen. Her aides made her come this morning, and she felt very nervous, and didn't want to come. BP 170/80, HR 72, RR 24, CBG 215, was 95% 2L, when on RA 78%. Is always on 2 L [place]. Reports she takes a diuretic, doesn't remember the name, but hasn't missed any doses. She is a x 4. C/o generalized body aches from arthritis.

## 2016-12-19 NOTE — Progress Notes (Signed)
  Echocardiogram 2D Echocardiogram has been performed.  Makayla Rodriguez 12/19/2016, 11:18 AM

## 2016-12-20 ENCOUNTER — Observation Stay (HOSPITAL_COMMUNITY): Payer: Medicare Other

## 2016-12-20 DIAGNOSIS — I350 Nonrheumatic aortic (valve) stenosis: Secondary | ICD-10-CM | POA: Diagnosis present

## 2016-12-20 DIAGNOSIS — Z66 Do not resuscitate: Secondary | ICD-10-CM | POA: Diagnosis present

## 2016-12-20 DIAGNOSIS — R0602 Shortness of breath: Secondary | ICD-10-CM | POA: Diagnosis not present

## 2016-12-20 DIAGNOSIS — Z87891 Personal history of nicotine dependence: Secondary | ICD-10-CM | POA: Diagnosis not present

## 2016-12-20 DIAGNOSIS — N183 Chronic kidney disease, stage 3 (moderate): Secondary | ICD-10-CM | POA: Diagnosis present

## 2016-12-20 DIAGNOSIS — E1122 Type 2 diabetes mellitus with diabetic chronic kidney disease: Secondary | ICD-10-CM | POA: Diagnosis present

## 2016-12-20 DIAGNOSIS — Z6841 Body Mass Index (BMI) 40.0 and over, adult: Secondary | ICD-10-CM | POA: Diagnosis not present

## 2016-12-20 DIAGNOSIS — G9341 Metabolic encephalopathy: Secondary | ICD-10-CM | POA: Diagnosis not present

## 2016-12-20 DIAGNOSIS — J9602 Acute respiratory failure with hypercapnia: Secondary | ICD-10-CM | POA: Diagnosis not present

## 2016-12-20 DIAGNOSIS — J9600 Acute respiratory failure, unspecified whether with hypoxia or hypercapnia: Secondary | ICD-10-CM | POA: Diagnosis not present

## 2016-12-20 DIAGNOSIS — Z515 Encounter for palliative care: Secondary | ICD-10-CM | POA: Diagnosis not present

## 2016-12-20 DIAGNOSIS — J9622 Acute and chronic respiratory failure with hypercapnia: Secondary | ICD-10-CM

## 2016-12-20 DIAGNOSIS — D638 Anemia in other chronic diseases classified elsewhere: Secondary | ICD-10-CM | POA: Diagnosis present

## 2016-12-20 DIAGNOSIS — Z885 Allergy status to narcotic agent status: Secondary | ICD-10-CM | POA: Diagnosis not present

## 2016-12-20 DIAGNOSIS — E119 Type 2 diabetes mellitus without complications: Secondary | ICD-10-CM | POA: Diagnosis not present

## 2016-12-20 DIAGNOSIS — Z880 Allergy status to penicillin: Secondary | ICD-10-CM | POA: Diagnosis not present

## 2016-12-20 DIAGNOSIS — M19079 Primary osteoarthritis, unspecified ankle and foot: Secondary | ICD-10-CM | POA: Diagnosis present

## 2016-12-20 DIAGNOSIS — I130 Hypertensive heart and chronic kidney disease with heart failure and stage 1 through stage 4 chronic kidney disease, or unspecified chronic kidney disease: Secondary | ICD-10-CM | POA: Diagnosis present

## 2016-12-20 DIAGNOSIS — E872 Acidosis: Secondary | ICD-10-CM | POA: Diagnosis not present

## 2016-12-20 DIAGNOSIS — G4733 Obstructive sleep apnea (adult) (pediatric): Secondary | ICD-10-CM | POA: Diagnosis not present

## 2016-12-20 DIAGNOSIS — I5033 Acute on chronic diastolic (congestive) heart failure: Secondary | ICD-10-CM | POA: Diagnosis not present

## 2016-12-20 DIAGNOSIS — E785 Hyperlipidemia, unspecified: Secondary | ICD-10-CM | POA: Diagnosis present

## 2016-12-20 DIAGNOSIS — F419 Anxiety disorder, unspecified: Secondary | ICD-10-CM | POA: Diagnosis present

## 2016-12-20 DIAGNOSIS — Z4682 Encounter for fitting and adjustment of non-vascular catheter: Secondary | ICD-10-CM | POA: Diagnosis not present

## 2016-12-20 DIAGNOSIS — T884XXA Failed or difficult intubation, initial encounter: Secondary | ICD-10-CM | POA: Diagnosis not present

## 2016-12-20 DIAGNOSIS — Z7189 Other specified counseling: Secondary | ICD-10-CM | POA: Diagnosis not present

## 2016-12-20 DIAGNOSIS — F329 Major depressive disorder, single episode, unspecified: Secondary | ICD-10-CM | POA: Diagnosis present

## 2016-12-20 DIAGNOSIS — I509 Heart failure, unspecified: Secondary | ICD-10-CM | POA: Diagnosis not present

## 2016-12-20 DIAGNOSIS — E662 Morbid (severe) obesity with alveolar hypoventilation: Secondary | ICD-10-CM | POA: Diagnosis not present

## 2016-12-20 DIAGNOSIS — K219 Gastro-esophageal reflux disease without esophagitis: Secondary | ICD-10-CM | POA: Diagnosis present

## 2016-12-20 DIAGNOSIS — E876 Hypokalemia: Secondary | ICD-10-CM | POA: Diagnosis not present

## 2016-12-20 DIAGNOSIS — Z853 Personal history of malignant neoplasm of breast: Secondary | ICD-10-CM | POA: Diagnosis not present

## 2016-12-20 DIAGNOSIS — R627 Adult failure to thrive: Secondary | ICD-10-CM | POA: Diagnosis not present

## 2016-12-20 DIAGNOSIS — J9621 Acute and chronic respiratory failure with hypoxia: Secondary | ICD-10-CM | POA: Diagnosis not present

## 2016-12-20 LAB — GLUCOSE, CAPILLARY
GLUCOSE-CAPILLARY: 139 mg/dL — AB (ref 65–99)
GLUCOSE-CAPILLARY: 173 mg/dL — AB (ref 65–99)
GLUCOSE-CAPILLARY: 101 mg/dL — AB (ref 65–99)
GLUCOSE-CAPILLARY: 95 mg/dL (ref 65–99)
Glucose-Capillary: 187 mg/dL — ABNORMAL HIGH (ref 65–99)

## 2016-12-20 LAB — BASIC METABOLIC PANEL
ANION GAP: 14 (ref 5–15)
BUN: 30 mg/dL — ABNORMAL HIGH (ref 6–20)
CALCIUM: 9.5 mg/dL (ref 8.9–10.3)
CO2: 36 mmol/L — ABNORMAL HIGH (ref 22–32)
Chloride: 93 mmol/L — ABNORMAL LOW (ref 101–111)
Creatinine, Ser: 0.95 mg/dL (ref 0.44–1.00)
GFR, EST NON AFRICAN AMERICAN: 54 mL/min — AB (ref >60)
GLUCOSE: 182 mg/dL — AB (ref 65–99)
POTASSIUM: 4.4 mmol/L (ref 3.5–5.1)
SODIUM: 143 mmol/L (ref 135–145)

## 2016-12-20 LAB — POCT I-STAT 3, ART BLOOD GAS (G3+)
ACID-BASE EXCESS: 17 mmol/L — AB (ref 0.0–2.0)
Bicarbonate: 47.7 mmol/L — ABNORMAL HIGH (ref 20.0–28.0)
O2 SAT: 100 %
PH ART: 7.331 — AB (ref 7.350–7.450)
TCO2: >50 mmol/L (ref 0–100)
pCO2 arterial: 89.9 mmHg (ref 32.0–48.0)
pO2, Arterial: 241 mmHg — ABNORMAL HIGH (ref 83.0–108.0)

## 2016-12-20 LAB — BLOOD GAS, ARTERIAL
Acid-Base Excess: 11.6 mmol/L — ABNORMAL HIGH (ref 0.0–2.0)
BICARBONATE: 39.8 mmol/L — AB (ref 20.0–28.0)
DELIVERY SYSTEMS: POSITIVE
EXPIRATORY PAP: 8
O2 CONTENT: 15 L/min
O2 Saturation: 96 %
PCO2 ART: 108 mmHg — AB (ref 32.0–48.0)
PH ART: 7.192 — AB (ref 7.350–7.450)
PO2 ART: 86.2 mmHg (ref 83.0–108.0)
Patient temperature: 98.6

## 2016-12-20 LAB — MRSA PCR SCREENING: MRSA BY PCR: NEGATIVE

## 2016-12-20 LAB — CBC
HCT: 34.5 % — ABNORMAL LOW (ref 36.0–46.0)
Hemoglobin: 10.8 g/dL — ABNORMAL LOW (ref 12.0–15.0)
MCH: 30.5 pg (ref 26.0–34.0)
MCHC: 31.3 g/dL (ref 30.0–36.0)
MCV: 97.5 fL (ref 78.0–100.0)
PLATELETS: 133 10*3/uL — AB (ref 150–400)
RBC: 3.54 MIL/uL — AB (ref 3.87–5.11)
RDW: 12.5 % (ref 11.5–15.5)
WBC: 12.3 10*3/uL — AB (ref 4.0–10.5)

## 2016-12-20 LAB — TRIGLYCERIDES: TRIGLYCERIDES: 75 mg/dL (ref <150)

## 2016-12-20 MED ORDER — ORAL CARE MOUTH RINSE
15.0000 mL | Freq: Four times a day (QID) | OROMUCOSAL | Status: DC
  Administered 2016-12-20 – 2016-12-22 (×7): 15 mL via OROMUCOSAL

## 2016-12-20 MED ORDER — NEBIVOLOL HCL 5 MG PO TABS
5.0000 mg | ORAL_TABLET | Freq: Every day | ORAL | Status: DC
  Administered 2016-12-21: 5 mg
  Filled 2016-12-20: qty 1

## 2016-12-20 MED ORDER — PANTOPRAZOLE SODIUM 40 MG IV SOLR
40.0000 mg | Freq: Every day | INTRAVENOUS | Status: DC
  Administered 2016-12-20: 40 mg via INTRAVENOUS
  Filled 2016-12-20: qty 40

## 2016-12-20 MED ORDER — ATORVASTATIN CALCIUM 10 MG PO TABS
10.0000 mg | ORAL_TABLET | Freq: Every day | ORAL | Status: DC
  Filled 2016-12-20: qty 1

## 2016-12-20 MED ORDER — PROPOFOL 1000 MG/100ML IV EMUL
5.0000 ug/kg/min | INTRAVENOUS | Status: DC
  Filled 2016-12-20: qty 100

## 2016-12-20 MED ORDER — FENTANYL CITRATE (PF) 100 MCG/2ML IJ SOLN
25.0000 ug | INTRAMUSCULAR | Status: DC | PRN
  Administered 2016-12-20 – 2016-12-21 (×3): 25 ug via INTRAVENOUS
  Filled 2016-12-20 (×2): qty 2

## 2016-12-20 MED ORDER — FENTANYL 2500MCG IN NS 250ML (10MCG/ML) PREMIX INFUSION
0.0000 ug/h | INTRAVENOUS | Status: DC
Start: 2016-12-20 — End: 2016-12-21
  Administered 2016-12-20: 25 ug/h via INTRAVENOUS
  Filled 2016-12-20: qty 250

## 2016-12-20 MED ORDER — PROPOFOL 1000 MG/100ML IV EMUL
0.0000 ug/kg/min | INTRAVENOUS | Status: DC
  Administered 2016-12-20: 25 ug/kg/min via INTRAVENOUS

## 2016-12-20 MED ORDER — INSULIN ASPART 100 UNIT/ML ~~LOC~~ SOLN
2.0000 [IU] | SUBCUTANEOUS | Status: DC
Start: 2016-12-20 — End: 2016-12-23
  Administered 2016-12-20: 4 [IU] via SUBCUTANEOUS
  Administered 2016-12-20: 2 [IU] via SUBCUTANEOUS
  Administered 2016-12-20: 4 [IU] via SUBCUTANEOUS
  Administered 2016-12-21 (×3): 2 [IU] via SUBCUTANEOUS
  Administered 2016-12-22: 6 [IU] via SUBCUTANEOUS
  Administered 2016-12-22: 4 [IU] via SUBCUTANEOUS
  Administered 2016-12-22: 6 [IU] via SUBCUTANEOUS
  Administered 2016-12-23: 4 [IU] via SUBCUTANEOUS

## 2016-12-20 MED ORDER — CHLORHEXIDINE GLUCONATE 0.12% ORAL RINSE (MEDLINE KIT)
15.0000 mL | Freq: Two times a day (BID) | OROMUCOSAL | Status: DC
  Administered 2016-12-20 – 2016-12-21 (×3): 15 mL via OROMUCOSAL

## 2016-12-20 MED ORDER — LORAZEPAM 2 MG/ML IJ SOLN
1.0000 mg | Freq: Once | INTRAMUSCULAR | Status: AC
  Administered 2016-12-20: 1 mg via INTRAVENOUS
  Filled 2016-12-20: qty 1

## 2016-12-20 MED ORDER — BISACODYL 10 MG RE SUPP: 10.0000 mg | Freq: Every day | RECTAL | Status: DC | PRN

## 2016-12-20 MED ORDER — ACETAMINOPHEN 160 MG/5ML PO SOLN
650.0000 mg | Freq: Four times a day (QID) | ORAL | Status: DC | PRN
  Administered 2016-12-20: 650 mg
  Filled 2016-12-20: qty 20.3

## 2016-12-20 MED ORDER — FENTANYL CITRATE (PF) 100 MCG/2ML IJ SOLN: 0.0000 ug | INTRAMUSCULAR | Status: DC | PRN

## 2016-12-20 MED ORDER — HYDRALAZINE HCL 20 MG/ML IJ SOLN
10.0000 mg | INTRAMUSCULAR | Status: DC | PRN
  Administered 2016-12-20: 20 mg via INTRAVENOUS
  Filled 2016-12-20: qty 1

## 2016-12-20 MED ORDER — PANTOPRAZOLE SODIUM 40 MG PO PACK
40.0000 mg | PACK | ORAL | Status: DC
  Administered 2016-12-20: 40 mg
  Filled 2016-12-20 (×2): qty 20

## 2016-12-20 MED ORDER — SERTRALINE HCL 50 MG PO TABS
75.0000 mg | ORAL_TABLET | Freq: Every day | ORAL | Status: DC
  Filled 2016-12-20: qty 1

## 2016-12-20 MED ORDER — MIDAZOLAM HCL 2 MG/2ML IJ SOLN
1.0000 mg | INTRAMUSCULAR | Status: DC | PRN
  Administered 2016-12-20 (×2): 1 mg via INTRAVENOUS
  Filled 2016-12-20 (×2): qty 2

## 2016-12-20 MED ORDER — MIDAZOLAM HCL 2 MG/2ML IJ SOLN: 1.0000 mg | INTRAMUSCULAR | Status: DC | PRN

## 2016-12-20 NOTE — Progress Notes (Signed)
Patient attempted to extubate herself while wearing mitt gloves. RT responded to bedside, ET Tube was 22cm at lips instead of 24 cm at lips. RT advanced ET Tube back to 24 cm at lips, good BBS noted and good color change on ET CO2. Dr Shawna Orleans in the box was notified of the near miss self extubation.

## 2016-12-20 NOTE — Consult Note (Signed)
PULMONARY / CRITICAL CARE MEDICINE   Name: Makayla Rodriguez MRN: [id number] DOB: [birth year]    ADMISSION DATE:  12/19/2016 CONSULTATION DATE:  12/20/2016  REFERRING MD:  Dr. Marily Memos  CHIEF COMPLAINT:  AMS  HISTORY OF PRESENT ILLNESS:   83 year old female with PMH as below, which is significant for CHF, Anxiety, CKD III, DM, GERD, OHS on home O2,and OSA on CPAP. She presented to [facility] 7/24 with SOB and peripheral edema. She was admitted to the hospitalist service for respiratory failure secondary to CHF exacerbation. She was treated with diuresis and had an I&O balance of 1.4 L over the course of the day, however, in the early AM hours of 7/24 she developed worsening respiratory distress and was ordered CPAP for her OSA. She was too anxious to use this and was ordered 1mg  Ativan. After receiving ativan she became minimally response. ABG concerning for worsening respiratory acidosis and she was transferred to ICU for intubation.    PAST MEDICAL HISTORY :  She  has a past medical history of Anxiety; Arthritis; CHF (congestive heart failure) ([place]); CKD (chronic kidney disease), stage III; Complication of anesthesia; Diabetes mellitus without complication ([place]); GERD (gastroesophageal reflux disease); Hypertension; Obesity; Obesity hypoventilation syndrome ([place]); Seasonal allergies; Shortness of breath; and Sleep apnea.  PAST SURGICAL HISTORY: She  has a past surgical history that includes Tonsillectomy; Cholecystectomy;  fybroid tumors; Breast surgery; Eye surgery; Hernia repair; Pilonidal cyst excision; and Partial mastectomy with needle localization (Left, 04/05/2013).  Allergies  Allergen Reactions  . Demerol [Meperidine] Anaphylaxis    Reaction: Hypoventilation after surgery  . Penicillins Anaphylaxis and Swelling    Has patient had a PCN reaction causing immediate rash, facial/tongue/throat swelling, SOB or lightheadedness with hypotension: Yes Has patient had a PCN reaction causing  severe rash involving mucus membranes or skin necrosis: No Has patient had a PCN reaction that required hospitalization: Yes Has patient had a PCN reaction occurring within the last 10 years: No If all of the above answers are "NO", then may proceed with Cephalosporin use.   Marland Kitchen Phenergan [Promethazine Hcl] Anaphylaxis    Reaction: hypoventilation  . Codeine Nausea And Vomiting  . Macrobid [Nitrofurantoin] Nausea And Vomiting  . Relafen [Nabumetone] Itching and Swelling    No current facility-administered medications on file prior to encounter.    Current Outpatient Prescriptions on File Prior to Encounter  Medication Sig  . acetaminophen (TYLENOL) 500 MG tablet Take 500 mg by mouth every 6 (six) hours as needed for mild pain.   Marland Kitchen albuterol (PROAIR HFA) 108 (90 Base) MCG/ACT inhaler Inhale 2 puffs into the lungs every 6 (six) hours as needed for wheezing or shortness of breath.  Marland Kitchen aspirin EC 81 MG tablet Take 81 mg by mouth every morning.  Marland Kitchen atorvastatin (LIPITOR) 10 MG tablet Take 10 mg by mouth daily. Reported on 05/14/2015  . azelastine (ASTELIN) 0.1 % nasal spray Place 2 sprays into both nostrils 2 (two) times daily. Use in each nostril as directed  . cetirizine (ZYRTEC) 10 MG tablet Take 10 mg by mouth daily. Reported on 05/14/2015  . cholecalciferol (VITAMIN D) 1000 UNITS tablet Take 1,000 Units by mouth every morning. Reported on 05/14/2015  . Chromium-Cinnamon [phone] MCG-MG CAPS Take 1 tablet by mouth at bedtime.   Marland Kitchen Dextromethorphan-Guaifenesin (TUSSIN DM PO) Take 5 mLs by mouth as needed (for cough).   . diclofenac sodium (VOLTAREN) 1 % GEL Apply 4 g topically 2 (two) times daily as needed (for joint pain). For  joint pain  . Dietary Management Product (TOZAL PO) Take 3 tablets by mouth daily. For eyes  . docusate sodium (COLACE) 100 MG capsule Take 100-200 mg by mouth at bedtime as needed for constipation.   . fluticasone (FLONASE) 50 MCG/ACT nasal spray Place 2 sprays into the  nose 2 (two) times daily.   Marland Kitchen glipiZIDE (GLUCOTROL XL) 10 MG 24 hr tablet Take 10 mg by mouth daily with breakfast.  . hydroxypropyl methylcellulose (ISOPTO TEARS) 2.5 % ophthalmic solution Place 1 drop into both eyes 3 (three) times daily as needed (for dry eyes).  . nebivolol (BYSTOLIC) 5 MG tablet Take 5 mg by mouth daily.  . pantoprazole (PROTONIX) 40 MG tablet Take 40 mg by mouth every morning.  . polyethylene glycol (MIRALAX / GLYCOLAX) packet Take 17 g by mouth daily as needed (for constipation).  . sertraline (ZOLOFT) 50 MG tablet Take 75 mg by mouth daily.   . sodium chloride (OCEAN) 0.65 % SOLN nasal spray Place 1 spray into both nostrils as needed for congestion.  . torsemide (DEMADEX) 20 MG tablet Take 20 mg by mouth daily.  . traMADol (ULTRAM) 50 MG tablet Take 50 mg by mouth 2 (two) times daily.   Marland Kitchen albuterol (PROVENTIL HFA;VENTOLIN HFA) 108 (90 BASE) MCG/ACT inhaler Inhale 2 puffs into the lungs every 4 (four) hours as needed for wheezing or shortness of breath. (Patient not taking: Reported on 12/19/2016)    FAMILY HISTORY:  Her indicated that her mother is deceased. She indicated that her father is deceased. She indicated that the status of her sister is unknown.    SOCIAL HISTORY: She  reports that she quit smoking about 36 years ago. Her smoking use included Cigarettes. She has a 20.00 pack-year smoking history. She has never used smokeless tobacco. She reports that she does not drink alcohol or use drugs.  REVIEW OF SYSTEMS:   Unable as patient is encephalopathic and intubated  SUBJECTIVE:    VITAL SIGNS: BP (!) 145/68   Pulse 75   Temp 98.6 F (37 C) (Axillary)   Resp 19   Ht 4' 9.5" (1.461 m)   Wt 102.9 kg (226 lb 14.4 oz)   SpO2 98%   BMI 48.25 kg/m   HEMODYNAMICS:    VENTILATOR SETTINGS:    INTAKE / OUTPUT: I/O last 3 completed shifts: In: -  Out: 1000 [Urine:1000]  PHYSICAL EXAMINATION: General:  Morbidly obese female in NAD Neuro:  Obtunded.  GCS 3 HEENT:  [place]/AT, PERRL, no appreciable JVD Cardiovascular:  RRR, no MRG Lungs:  Distant, coarse.  Abdomen:  Soft, non-distended, non-tender Musculoskeletal:  No acute deformity Skin:  Grossly intact  LABS:  BMET  Recent Labs Lab 12/19/16 0728  NA 141  K 4.4  CL 95*  CO2 38*  BUN 34*  CREATININE 0.92  GLUCOSE 167*    Electrolytes  Recent Labs Lab 12/19/16 0728  CALCIUM 9.5    CBC  Recent Labs Lab 12/19/16 0728  WBC 8.6  HGB 10.4*  HCT 34.8*  PLT 174    Coag's  Recent Labs Lab 12/19/16 2214  INR 1.10    Sepsis Markers No results for input(s): LATICACIDVEN, PROCALCITON, O2SATVEN in the last 168 hours.  ABG No results for input(s): PHART, PCO2ART, PO2ART in the last 168 hours.  Liver Enzymes  Recent Labs Lab 12/19/16 0728  AST 13*  ALT 10*  ALKPHOS 39  BILITOT 0.7  ALBUMIN 3.4*    Cardiac Enzymes  Recent Labs Lab 12/19/16 1653  12/19/16 2214  TROPONINI 0.05* <0.03    Glucose  Recent Labs Lab 12/19/16 1228 12/19/16 1606 12/19/16 2109 12/20/16 0342  GLUCAP 158* 157* 220* 139*    Imaging Dg Chest 2 View  Result Date: 12/19/2016 CLINICAL DATA:  Shortness of breath. EXAM: CHEST  2 VIEW COMPARISON:  Radiographs of Oct 10, 2016. FINDINGS: Stable cardiomediastinal silhouette. Atherosclerosis of thoracic aorta is noted. No pneumothorax is noted. Mild diffuse interstitial densities are noted throughout both lungs concerning for chronic scarring, although superimposed edema or inflammation cannot be excluded. Increased left basilar atelectasis or infiltrate is noted with mild left pleural effusion. Multilevel degenerative disc disease of thoracic spine is noted. IMPRESSION: Aortic atherosclerosis. Stable mild diffuse interstitial densities are noted throughout both lungs concerning for chronic scarring or inflammation, although superimposed acute edema or inflammation is noted. Increased left basilar atelectasis or infiltrate is noted  with mild left pleural effusion. Electronically Signed   By: Marijo Conception, M.D.   On: 12/19/2016 08:00   Dg Chest Port 1 View  Result Date: 12/20/2016 CLINICAL DATA:  Shortness of breath. EXAM: PORTABLE CHEST 1 VIEW COMPARISON:  Frontal and lateral views yesterday. Chest CT 01/15/2015 FINDINGS: Low lung volumes. Cardiomegaly with atherosclerosis and tortuosity of the thoracic aorta. Diffuse interstitial opacities may be bronchial inflammation or pulmonary edema. Blunting of left costophrenic angle, likely in part secondary to pleural based lesion is seen on prior CT. Possible left pleural effusion. No pneumothorax. The exam is otherwise unchanged. IMPRESSION: 1. Diffuse interstitial opacities may be bronchial thickening or pulmonary edema, unchanged from prior exam. 2. Left lung base opacity likely partially related to pleural-based lesion as seen on prior chest CT. Possible small pleural effusion. 3. Cardiomegaly with aortic atherosclerosis Electronically Signed   By: Jeb Levering M.D.   On: 12/20/2016 03:44     STUDIES:  Echo 7/23 > LVEF 65-70%, technically insufficient study to evaluate diastolic function.  CULTURES:   ANTIBIOTICS:   SIGNIFICANT EVENTS:   LINES/TUBES: ETT 7/24 >  DISCUSSION: 83 year old female admitted with CHF exacerbation developed anxiety, which prevented her from using CPAP. She was given ativan to help with this, but became unresponsive. Transferred to ICU and intubated.   ASSESSMENT / PLAN:  PULMONARY A: Acute on chronic hypercarbic/hypoxemic respiratory failure in the setting of CHF, decompensated OSA/OHS OSA on CPAP OHS on home O2  P:   STAT intubation Full vent support ABG CXR VAP bundle Titrate FiO2 to O2 sats 88-95%  CARDIOVASCULAR A:  Acute on chronic diastolic CHF Hypertension  P:  Telemetry monitoring  Continued diuresis as BP tolerates Continue home atorvastatin, nebivolol, ASA Holding home toresemide Daily weights PRN  apresoline   RENAL A:   CKD III  P:   Follow BMP Strict I&O  GASTROINTESTINAL A:   GERD  P:   NPO Protonix  HEMATOLOGIC A:   Anemia  P:  Follow CBC SQ enoxaparin for VTE ppx  INFECTIOUS A:   No acute issues  P:   Follow WBC and fever curve  ENDOCRINE A:   DM  P:   CBG monitoring and SSI  NEUROLOGIC A:   Acute metabolic encephalopathy secondary to hypercarbia/acidosis  P:   RASS goal: 0 to -1 Versed PRN Monitor   FAMILY  - Updates: sister updated  - Inter-disciplinary family meet or Palliative Care meeting due by:  7/30   Georgann Housekeeper, AGACNP-BC [place] [facility] Pager [phone] or [phone]  12/20/2016 4:38 AM

## 2016-12-20 NOTE — Procedures (Signed)
Procedure Name: Intubation Date/Time: 12/20/2016 4:23 AM Performed by: Charlesetta Garibaldi Pre-anesthesia Checklist: Patient identified, Emergency Drugs available, Suction available, Patient being monitored and Timeout performed Oxygen Delivery Method: Non-rebreather mask Preoxygenation: Pre-oxygenation with 100% oxygen Induction Type: IV induction Laryngoscope Size: Glidescope and 4 Grade View: Grade III Tube type: Subglottic suction tube Tube size: 7.5 mm Number of attempts: 1 Airway Equipment and Method: Video-laryngoscopy and Rigid stylet Placement Confirmation: ETT inserted through vocal cords under direct vision,  CO2 detector and Breath sounds checked- equal and bilateral Secured at: 24 cm Dental Injury: Injury to lip  Difficulty Due To: Difficult Airway- due to anterior larynx and Difficult Airway- due to limited oral opening Future Recommendations: Recommend- induction with short-acting agent, and alternative techniques readily available

## 2016-12-20 NOTE — Significant Event (Signed)
Rapid Response Event Note RN called for Respiratory Distress Overview: Time Called: 0239 Arrival Time: 0245 Event Type: Respiratory  Initial Focused Assessment: On arrival pt lying supine in bed, skin pale, warm and dry, 98% on NRB 15L., unresponsive. Pt had received Ativan 1 mg approx 1 hour prior for anxiety and refusing to wear CPAP. Per RN Spo2 had dropped to 60's while sleeping on CPAP.    Interventions: ABG 7.19/108/86/39, portable CXR, transferred to 56m08. PCCM atbedside   Event Summary: Name of Physician Notified: Blount NP  at  (PTA RRT )  Name of Consulting Physician Notified: Eddie Dibbles NP  at 0250  Outcome: Transferred (Comment) (2M08)     Gevena Mart, Sela Hua

## 2016-12-20 NOTE — Progress Notes (Signed)
   12/20/16 0000  BiPAP/CPAP/SIPAP  BiPAP/CPAP/SIPAP Pt Type Adult  Mask Type Nasal mask  Mask Size Small  Flow Rate 3 lpm  BiPAP/CPAP/SIPAP CPAP  Patient Home Equipment No  Auto Titrate Yes

## 2016-12-20 NOTE — Progress Notes (Addendum)
12/19/16 (Night shift) - Called to bedside by RN with concerns of new onset afib and labored breathing while sleeping. Patient responsive to verbal commands.SATs 94-97% on 3L [place]. LCTA. Noted in chart she is to where CPAP machine HS but patient refusing d/t anxiety. Ativan ordered. Advised RN to place patient on CPAP for the night.  ECG reviewed. P-waves, but irregular rhythm noted. HR 70's. Unlikely to be Afib CV assessment noted RRR, grade III systolic murmur noted.   12/19/16 0310 - Called to the bedside approximately one hour after giving 1mg  ativan and being placed on CPAP. Patient desat to 36's according to RN. ABGs and portable CXR ordered. Patient tx to ICU for respiratory distress.

## 2016-12-20 NOTE — Progress Notes (Signed)
Pt 12L EKG this am showing Atrial Fibrillation with controlled HR in the 80's.  Dr. Hal Hope was notified.

## 2016-12-20 NOTE — Progress Notes (Addendum)
0200 Pt was restless and pulled out the CPAP mask, pt   stated " I feel like I'm drowning". Pt put back on 3L of O2 per [place] sats in 94-97%.  Pt still restless and combative per aide at the bedside. Paged Blount (NP) on call and came to see the pt again and ordered one time dose of 1mg  IV Ativan. 0217 1mg  IV Ativan given and placed the CPAP back but pt was desating to 70's. Called the rapid Surveyor, quantity (NP on call)Pt placed on non rebreather mask , ABG was done, CXR ordered. Pt then transferred to 2M08. Aide at the bedside informed the sister (HPOA) of pt room transfer. Magda Paganini (rapid response) talked with the sister over the phone.

## 2016-12-21 ENCOUNTER — Inpatient Hospital Stay (HOSPITAL_COMMUNITY): Payer: Medicare Other

## 2016-12-21 DIAGNOSIS — J9602 Acute respiratory failure with hypercapnia: Secondary | ICD-10-CM

## 2016-12-21 LAB — GLUCOSE, CAPILLARY
GLUCOSE-CAPILLARY: 120 mg/dL — AB (ref 65–99)
GLUCOSE-CAPILLARY: 127 mg/dL — AB (ref 65–99)
GLUCOSE-CAPILLARY: 127 mg/dL — AB (ref 65–99)
GLUCOSE-CAPILLARY: 133 mg/dL — AB (ref 65–99)
GLUCOSE-CAPILLARY: 98 mg/dL (ref 65–99)
Glucose-Capillary: 118 mg/dL — ABNORMAL HIGH (ref 65–99)
Glucose-Capillary: 106 mg/dL — ABNORMAL HIGH (ref 65–99)

## 2016-12-21 LAB — BASIC METABOLIC PANEL
Anion gap: 14 (ref 5–15)
BUN: 31 mg/dL — ABNORMAL HIGH (ref 6–20)
CHLORIDE: 93 mmol/L — AB (ref 101–111)
CO2: 38 mmol/L — AB (ref 22–32)
CREATININE: 1.05 mg/dL — AB (ref 0.44–1.00)
Calcium: 9.4 mg/dL (ref 8.9–10.3)
GFR calc non Af Amer: 48 mL/min — ABNORMAL LOW (ref >60)
GFR, EST AFRICAN AMERICAN: 55 mL/min — AB (ref >60)
Glucose, Bld: 101 mg/dL — ABNORMAL HIGH (ref 65–99)
POTASSIUM: 3.3 mmol/L — AB (ref 3.5–5.1)
Sodium: 145 mmol/L (ref 135–145)

## 2016-12-21 LAB — CBC
HEMATOCRIT: 35.6 % — AB (ref 36.0–46.0)
HEMOGLOBIN: 11 g/dL — AB (ref 12.0–15.0)
MCH: 29.3 pg (ref 26.0–34.0)
MCHC: 30.9 g/dL (ref 30.0–36.0)
MCV: 94.7 fL (ref 78.0–100.0)
PLATELETS: 210 10*3/uL (ref 150–400)
RBC: 3.76 MIL/uL — AB (ref 3.87–5.11)
RDW: 12.6 % (ref 11.5–15.5)
WBC: 10.1 10*3/uL (ref 4.0–10.5)

## 2016-12-21 LAB — HEMOGLOBIN A1C
Hgb A1c MFr Bld: 7.7 % — ABNORMAL HIGH (ref 4.8–5.6)
Mean Plasma Glucose: 174 mg/dL

## 2016-12-21 MED ORDER — SERTRALINE HCL 50 MG PO TABS
50.0000 mg | ORAL_TABLET | Freq: Every day | ORAL | Status: DC
Start: 2016-12-22 — End: 2016-12-21

## 2016-12-21 MED ORDER — ATORVASTATIN CALCIUM 10 MG PO TABS
10.0000 mg | ORAL_TABLET | Freq: Every day | ORAL | Status: DC
  Administered 2016-12-21 – 2016-12-29 (×9): 10 mg via ORAL
  Filled 2016-12-21 (×9): qty 1

## 2016-12-21 MED ORDER — POTASSIUM CHLORIDE 10 MEQ/100ML IV SOLN
10.0000 meq | INTRAVENOUS | Status: AC
  Administered 2016-12-21 (×4): 10 meq via INTRAVENOUS
  Filled 2016-12-21 (×4): qty 100

## 2016-12-21 MED ORDER — PANTOPRAZOLE SODIUM 40 MG PO PACK: 40.0000 mg | PACK | ORAL | Status: DC

## 2016-12-21 MED ORDER — ACETAMINOPHEN 160 MG/5ML PO SOLN
650.0000 mg | Freq: Four times a day (QID) | ORAL | Status: DC | PRN
  Administered 2016-12-21 – 2016-12-29 (×12): 650 mg via ORAL
  Filled 2016-12-21 (×15): qty 20.3

## 2016-12-21 MED ORDER — DICLOFENAC SODIUM 1 % TD GEL
4.0000 g | Freq: Two times a day (BID) | TRANSDERMAL | Status: DC | PRN
  Administered 2016-12-22 – 2016-12-23 (×2): 4 g via TOPICAL
  Filled 2016-12-21 (×3): qty 100

## 2016-12-21 MED ORDER — PANTOPRAZOLE SODIUM 40 MG PO TBEC
40.0000 mg | DELAYED_RELEASE_TABLET | Freq: Every day | ORAL | Status: DC
  Administered 2016-12-21 – 2016-12-29 (×9): 40 mg via ORAL
  Filled 2016-12-21 (×8): qty 1

## 2016-12-21 MED ORDER — SERTRALINE HCL 50 MG PO TABS
50.0000 mg | ORAL_TABLET | Freq: Every day | ORAL | Status: DC
  Administered 2016-12-21: 50 mg
  Filled 2016-12-21: qty 1

## 2016-12-21 MED ORDER — SERTRALINE HCL 50 MG PO TABS: 75.0000 mg | ORAL_TABLET | Freq: Every day | ORAL | Status: DC

## 2016-12-21 MED ORDER — SERTRALINE HCL 50 MG PO TABS
75.0000 mg | ORAL_TABLET | Freq: Every day | ORAL | Status: DC
  Administered 2016-12-22 – 2016-12-29 (×8): 75 mg via ORAL
  Filled 2016-12-21: qty 1
  Filled 2016-12-21: qty 2
  Filled 2016-12-21: qty 1
  Filled 2016-12-21: qty 2
  Filled 2016-12-21 (×2): qty 1
  Filled 2016-12-21 (×2): qty 2

## 2016-12-21 MED ORDER — NEBIVOLOL HCL 5 MG PO TABS
5.0000 mg | ORAL_TABLET | Freq: Every day | ORAL | Status: DC
  Administered 2016-12-22 – 2016-12-29 (×8): 5 mg via ORAL
  Filled 2016-12-21 (×10): qty 1

## 2016-12-21 NOTE — Procedures (Signed)
Extubation Procedure Note  Patient Details:   Name: Janese Radabaugh DOB: 1932/03/17 MRN: [id number]   Airway Documentation:  Airway 7.5 mm (Active)  Secured at (cm) 24 cm 12/21/2016  8:02 AM  Measured From Lips 12/21/2016  8:02 AM  [place] 12/21/2016  8:02 AM  Secured By [employer] 12/21/2016  8:02 AM  Tube Holder Repositioned Yes 12/21/2016  8:02 AM  Cuff Pressure (cm H2O) 25 cm H2O 12/21/2016  8:02 AM  Site Condition Dry 12/21/2016  8:02 AM    Evaluation  O2 sats: stable throughout Complications: No apparent complications Patient did tolerate procedure well. Bilateral Breath Sounds: Clear, Diminished   Yes   Patient extubated per MD order. Patient currently on 4L [place] with O2 sat of 96%. Patient was able to speak and vitals are stable. RT will continue to monitor.   Cherice Glennie Clyda Greener 12/21/2016, 11:03 AM

## 2016-12-21 NOTE — Progress Notes (Signed)
PULMONARY / CRITICAL CARE MEDICINE   Name: Makayla Rodriguez MRN: [id number] DOB: January 15, 1932    ADMISSION DATE:  12/19/2016 CONSULTATION DATE:  12/20/2016  REFERRING MD:  Dr. Marily Memos  CHIEF COMPLAINT:  AMS  HISTORY OF PRESENT ILLNESS:   83 year old female with PMH as below, which is significant for CHF, Anxiety, CKD III, DM, GERD, OHS on home O2,and OSA on CPAP. She presented to [facility] 7/24 with SOB and peripheral edema. She was admitted to the hospitalist service for respiratory failure secondary to CHF exacerbation. She was treated with diuresis and had an I&O balance of 1.4 L over the course of the day, however, in the early AM hours of 7/24 she developed worsening respiratory distress and was ordered CPAP for her OSA. She was too anxious to use this and was ordered 1mg  Ativan. After receiving ativan she became minimally response. ABG concerning for worsening respiratory acidosis and she was transferred to ICU for intubation.    SUBJECTIVE:  Attempted self-extubation 7/24 PM and RT advanced tube back in place. CXR 7/25 AM showing ET tube 1.3cm above carina.   VITAL SIGNS: BP (!) 121/48   Pulse 68   Temp 99.1 F (37.3 C) (Oral)   Resp 20   Ht 4' 9.5" (1.461 m)   Wt 284 lb 9.8 oz (129.1 kg)   SpO2 93%   BMI 60.52 kg/m   HEMODYNAMICS:    VENTILATOR SETTINGS: Vent Mode: PRVC FiO2 (%):  [40 %] 40 % Set Rate:  [20 bmp] 20 bmp Vt Set:  [360 mL] 360 mL PEEP:  [5 cmH20] 5 cmH20 Pressure Support:  [12 cmH20] 12 cmH20 Plateau Pressure:  [16 cmH20-17 cmH20] 17 cmH20  INTAKE / OUTPUT: I/O last 3 completed shifts: In: 286.2 [I.V.:286.2] Out: 2775 [Urine:2225; Emesis/NG output:550]  PHYSICAL EXAMINATION: General:  Morbidly obese female in NAD Neuro: alert, follows commands, mouthing words and asking to be extubated HEENT:  [place]/AT, PERRL, no appreciable JVD Cardiovascular:  RRR, no MRG Lungs:  Distant, coarse.  Abdomen:  Soft, non-distended, non-tender Musculoskeletal:  No  acute deformity Skin:  Grossly intact LABS:  BMET  Recent Labs Lab 12/19/16 0728 12/20/16 0747  NA 141 143  K 4.4 4.4  CL 95* 93*  CO2 38* 36*  BUN 34* 30*  CREATININE 0.92 0.95  GLUCOSE 167* 182*    Electrolytes  Recent Labs Lab 12/19/16 0728 12/20/16 0747  CALCIUM 9.5 9.5    CBC  Recent Labs Lab 12/19/16 0728 12/20/16 0747  WBC 8.6 12.3*  HGB 10.4* 10.8*  HCT 34.8* 34.5*  PLT 174 133*    Coag's  Recent Labs Lab 12/19/16 2214  INR 1.10    Sepsis Markers No results for input(s): LATICACIDVEN, PROCALCITON, O2SATVEN in the last 168 hours.  ABG  Recent Labs Lab 12/20/16 0255 12/20/16 0456  PHART 7.192* 7.331*  PCO2ART 108* 89.9*  PO2ART 86.2 241.0*    Liver Enzymes  Recent Labs Lab 12/19/16 0728  AST 13*  ALT 10*  ALKPHOS 39  BILITOT 0.7  ALBUMIN 3.4*    Cardiac Enzymes  Recent Labs Lab 12/19/16 1653 12/19/16 2214  TROPONINI 0.05* <0.03    Glucose  Recent Labs Lab 12/20/16 0751 12/20/16 1143 12/20/16 1543 12/20/16 1955 12/21/16 0015 12/21/16 0416  GLUCAP 187* 173* 101* 95 133* 118*    Imaging Dg Chest Port 1 View  Result Date: 12/21/2016 CLINICAL DATA:  Respiratory failure. EXAM: PORTABLE CHEST 1 VIEW COMPARISON:  12/20/2016 . FINDINGS: Endotracheal tube approximately 1.3 cm  above the carina. Proximal repositioning of approximately 2 cm should be considered. NG tube in stable position. Stable cardiomegaly. Persistent bibasilar atelectasis and infiltrates. Small left pleural effusion again noted. No pneumothorax . IMPRESSION: 1. Endotracheal tube 1.3 cm above the carina. Proximal repositioning of approximately 2 cm should be considered. NG tube in stable position. 2. Persistent unchanged bibasilar atelectasis and infiltrates. Persistent small left pleural effusion. 3. Stable cardiomegaly . Electronically Signed   By: Marcello Moores  Register   On: 12/21/2016 06:46     STUDIES:  Echo 7/23 > LVEF 65-70%, technically  insufficient study to evaluate diastolic function. CXR 7/25> 1. Endotracheal tube 1.3 cm above the carina.Persistent unchanged bibasilar atelectasis and infiltrates.Persistent small left pleural effusion. Stable cardiomegaly .  CULTURES: MRSA PCR negative   SIGNIFICANT EVENTS: 7/24> admitted, attempted self-extubation   LINES/TUBES: ETT 7/24 > NGT 7/24> Urethral cath 7/24> PIV x2 (left hand 7/23, right hand 7/24)   DISCUSSION: 83 year old female admitted with CHF exacerbation developed anxiety, which prevented her from using CPAP. She was given ativan to help with this, but became unresponsive. Transferred to ICU and intubated.   ASSESSMENT / PLAN:  PULMONARY A: Acute on chronic hypercarbic/hypoxemic respiratory failure in the setting of CHF, decompensated OSA/OHS OSA on CPAP OHS on home O2  P:   Extubate 7/25 Titrate FiO2 to O2 sats 88-95% BIPAP at night  CARDIOVASCULAR A:  Acute on chronic diastolic CHF Hypertension  P:  Telemetry monitoring  Lasix IV 40mg  BID Continue home atorvastatin, nebivolol, ASA Daily weights PRN apresoline   RENAL A:   CKD III UOP 1.5L s/p lasix 40mg  IV BID  P:   Follow BMP Strict I&O  GASTROINTESTINAL A:   GERD  P:   NPO; initiate tube feeds Protonix Colace, dulcolax  HEMATOLOGIC A:   Anemia  P:  Follow CBC SQ enoxaparin for VTE ppx  INFECTIOUS A:   No acute issues  P:   Follow WBC and fever curve  ENDOCRINE A:   DM  P:   CBG monitoring and SSI  NEUROLOGIC A:   Acute metabolic encephalopathy secondary to hypercarbia/acidosis  P:   Plan to extubate Monitor   FAMILY  - Updates: sister updated at bedside 7/25  - Inter-disciplinary family meet or Palliative Care meeting due by:  7/30    Pulmonary and [place] Pager: [phone]  12/21/2016, 7:53 AM

## 2016-12-21 NOTE — Progress Notes (Signed)
Placed patient on Bipap for the night with IPAP set at 12cm and EPAP set at 6cm. Oxygen set at 4lpm with Sp02=98%

## 2016-12-21 NOTE — Progress Notes (Signed)
Wasted 75 ml of fentanyl in sink. Loma Newton witnessed waste.

## 2016-12-22 LAB — BASIC METABOLIC PANEL
ANION GAP: 14 (ref 5–15)
BUN: 31 mg/dL — AB (ref 6–20)
CO2: 38 mmol/L — ABNORMAL HIGH (ref 22–32)
Calcium: 9 mg/dL (ref 8.9–10.3)
Chloride: 93 mmol/L — ABNORMAL LOW (ref 101–111)
Creatinine, Ser: 0.94 mg/dL (ref 0.44–1.00)
GFR, EST NON AFRICAN AMERICAN: 55 mL/min — AB (ref >60)
Glucose, Bld: 104 mg/dL — ABNORMAL HIGH (ref 65–99)
POTASSIUM: 3.5 mmol/L (ref 3.5–5.1)
SODIUM: 145 mmol/L (ref 135–145)

## 2016-12-22 LAB — GLUCOSE, CAPILLARY
GLUCOSE-CAPILLARY: 154 mg/dL — AB (ref 65–99)
GLUCOSE-CAPILLARY: 239 mg/dL — AB (ref 65–99)
Glucose-Capillary: 106 mg/dL — ABNORMAL HIGH (ref 65–99)
Glucose-Capillary: 102 mg/dL — ABNORMAL HIGH (ref 65–99)
Glucose-Capillary: 155 mg/dL — ABNORMAL HIGH (ref 65–99)
Glucose-Capillary: 215 mg/dL — ABNORMAL HIGH (ref 65–99)

## 2016-12-22 LAB — MAGNESIUM: MAGNESIUM: 1.8 mg/dL (ref 1.7–2.4)

## 2016-12-22 LAB — CBC
HCT: 33.9 % — ABNORMAL LOW (ref 36.0–46.0)
HEMOGLOBIN: 10.2 g/dL — AB (ref 12.0–15.0)
MCH: 28.7 pg (ref 26.0–34.0)
MCHC: 30.1 g/dL (ref 30.0–36.0)
MCV: 95.2 fL (ref 78.0–100.0)
PLATELETS: 223 10*3/uL (ref 150–400)
RBC: 3.56 MIL/uL — AB (ref 3.87–5.11)
RDW: 12.6 % (ref 11.5–15.5)
WBC: 8.4 10*3/uL (ref 4.0–10.5)

## 2016-12-22 LAB — PHOSPHORUS: PHOSPHORUS: 3.3 mg/dL (ref 2.5–4.6)

## 2016-12-22 MED ORDER — TRAMADOL HCL 50 MG PO TABS
50.0000 mg | ORAL_TABLET | Freq: Two times a day (BID) | ORAL | Status: DC | PRN
  Administered 2016-12-24 – 2016-12-27 (×4): 50 mg via ORAL
  Filled 2016-12-22 (×8): qty 1

## 2016-12-22 MED ORDER — ORAL CARE MOUTH RINSE
15.0000 mL | Freq: Two times a day (BID) | OROMUCOSAL | Status: DC
  Administered 2016-12-22 – 2016-12-29 (×12): 15 mL via OROMUCOSAL

## 2016-12-22 NOTE — Care Management Note (Addendum)
Case Management Note  Patient Details  Name: Makayla Rodriguez MRN: [id number] Date of Birth: 1931-10-13  Subjective/Objective:     Transferred to ICU, home alone,  with CHF exacerbation developed anxiety, which prevented her from using CPAP. She was given ativan  but became unresponsive. Transferred to ICU and intubated. Extubated 7/25.             Action/Plan: NCM will follow for dc needs.   Expected Discharge Date:                  Expected Discharge Plan:     In-House Referral:     Discharge planning Services  CM Consult  Post Acute Care Choice:    Choice offered to:     DME Arranged:    DME Agency:     HH Arranged:    HH Agency:     Status of Service:  In process, will continue to follow  If discussed at Long Length of Stay Meetings, dates discussed:    Additional Comments:  Zenon Mayo, RN 12/22/2016, 12:55 PM

## 2016-12-22 NOTE — Progress Notes (Signed)
PULMONARY / CRITICAL CARE MEDICINE   Name: Makayla Rodriguez MRN: [id number] DOB: 01-26-1932    ADMISSION DATE:12/19/2016 CONSULTATION DATE:12/20/2016  REFERRING MD:Dr. Marily Memos  CHIEF COMPLAINT:AMS  HISTORY OF PRESENT ILLNESS: 83 year old female with PMH as below, which is significant for CHF, Anxiety, CKD III, DM, GERD, OHS on home O2,and OSA on CPAP. She presented to [facility] 7/24 with SOB and peripheral edema. She was admitted to the hospitalist service for respiratory failure secondary to CHF exacerbation. She was treated with diuresis and had an I&O balance of 1.4 L over the course of the day, however, in the early AM hours of 7/24 she developed worsening respiratory distress and was ordered CPAP for her OSA. She was too anxious to use this and was ordered 1mg  Ativan. After receiving ativan she became minimally response. ABG concerning for worsening respiratory acidosis and she was transferred to ICU for intubation.   SUBJECTIVE:  Extubated 7/25, placed on BIPAP overnight. No acute overnight event.  VITAL SIGNS: BP (!) 149/128   Pulse 76   Temp 100 F (37.8 C) (Axillary)   Resp (!) 22   Ht 4' 9.5" (1.461 m)   Wt 282 lb 10.1 oz (128.2 kg)   SpO2 93%   BMI 60.10 kg/m   HEMODYNAMICS:    VENTILATOR SETTINGS: FiO2 (%):  [32 %] 32 %  INTAKE / OUTPUT: I/O last 3 completed shifts: In: 739.2 [I.V.:339.2; IV Piggyback:400] Out: 0865 [Urine:3150; Emesis/NG output:400]  PHYSICAL EXAMINATION: General: Morbidly obese female in NAD Neuro: alert, follows commands HEENT: [place]/AT, PERRL, no appreciable JVD Cardiovascular: RRR, no MRG Lungs: Distant, coarse.  Abdomen: Soft, non-distended, non-tender Musculoskeletal: No acute deformity Skin: Grossly intact  LABS:  BMET  Recent Labs Lab 12/20/16 0747 12/21/16 1100 12/22/16 0216  NA 143 145 145  K 4.4 3.3* 3.5  CL 93* 93* 93*  CO2 36* 38* 38*  BUN 30* 31* 31*  CREATININE 0.95 1.05* 0.94  GLUCOSE 182*  101* 104*    Electrolytes  Recent Labs Lab 12/20/16 0747 12/21/16 1100 12/22/16 0216  CALCIUM 9.5 9.4 9.0  MG  --   --  1.8  PHOS  --   --  3.3    CBC  Recent Labs Lab 12/20/16 0747 12/21/16 1100 12/22/16 0216  WBC 12.3* 10.1 8.4  HGB 10.8* 11.0* 10.2*  HCT 34.5* 35.6* 33.9*  PLT 133* 210 223    Coag's  Recent Labs Lab 12/19/16 2214  INR 1.10    Sepsis Markers No results for input(s): LATICACIDVEN, PROCALCITON, O2SATVEN in the last 168 hours.  ABG  Recent Labs Lab 12/20/16 0255 12/20/16 0456  PHART 7.192* 7.331*  PCO2ART 108* 89.9*  PO2ART 86.2 241.0*    Liver Enzymes  Recent Labs Lab 12/19/16 0728  AST 13*  ALT 10*  ALKPHOS 39  BILITOT 0.7  ALBUMIN 3.4*    Cardiac Enzymes  Recent Labs Lab 12/19/16 1653 12/19/16 2214  TROPONINI 0.05* <0.03    Glucose  Recent Labs Lab 12/21/16 0752 12/21/16 1138 12/21/16 1535 12/21/16 1948 12/21/16 2324 12/22/16 0314  GLUCAP 127* 98 127* 120* 106* 106*    Imaging No results found.   STUDIES: Echo 7/23 > LVEF 65-70%, technically insufficient study to evaluate diastolic function. CXR 7/25> 1. Endotracheal tube 1.3 cm above the carina.Persistent unchanged bibasilar atelectasis and infiltrates.Persistent small left pleural effusion. Stable cardiomegaly .  CULTURES: MRSA PCR negative   SIGNIFICANT EVENTS: 7/24> admitted, attempted self-extubation 7/25> extubated   LINES/TUBES: ETT 7/24 >7/25 NGT 7/24>7/25  Urethral cath 7/24> PIV x2 (left hand 7/23, right hand 7/24)   DISCUSSION: 83 year old female admitted with CHF exacerbation developed anxiety, which prevented her from using CPAP. She was given ativan to help with this, but became unresponsive. Transferred to ICU and intubated. Extubated 8/82 without complication and tolerated bipap overnight. Appropriate for transfer to med surg today.   ASSESSMENT / PLAN:  PULMONARY A: Acute on chronic hypercarbic/hypoxemic  respiratory failurein the setting of CHF, decompensated OSA/OHS OSA on CPAP OHS on home O2 -bipap overnight without complications  P: CPAP at night Maintain sats >92%  CARDIOVASCULAR A:  Acute on chronic diastolic CHF Hypertension P: Telemetry monitoring  Lasix IV 40mg  BID Continue home atorvastatin, nebivolol, ASA Daily weights PRN apresoline  Weight -3 pounds from admission, 2.4L UOP, net 5.3 L  RENAL A:  CKD III UOP 2.4L with lasix 40mg  BID  P: Follow BMP Strict I&O  GASTROINTESTINAL A:  GERD  P: Clear liquid diet Continue protonix Colace, dulcolax  HEMATOLOGIC A:  Anemia  P: Follow CBC SQ enoxaparin for VTE ppx  INFECTIOUS A:  No acute issues  P: Follow WBC and fever curve  ENDOCRINE A:  DM  P: CBG monitoring and SSI  NEUROLOGIC A:  Acute metabolic encephalopathy secondary to hypercarbia/acidosis  P: Alert and oriented Continue to monitor   FAMILY - Updates: sister updated at bedside 7/25  - Inter-disciplinary family meet or Palliative Care meeting due by: 7/30    Pulmonary and [place] Pager: [phone]  12/22/2016, 8:31 AM

## 2016-12-22 NOTE — Progress Notes (Signed)
Pt takes Flonase 2 x day, and Astelin 1x day, please change this accordingly, thanks

## 2016-12-22 NOTE — Progress Notes (Signed)
Patient transfer from 14M, VSS, on 3 liters, sitter at bedside.

## 2016-12-22 NOTE — Progress Notes (Signed)
[name] Progress Note Patient Name: Makayla Rodriguez DOB: [birth year] MRN: [id number]   Date of Service  12/22/2016  HPI/Events of Note  Nurse reports patient requesting restart for home tramadol. Reporting diffuse pain.   eICU Interventions  1. Home medication list reviewed 2. Tramadol 50 mg by mouth every 12 hours when necessary ordered for moderate and severe pain      Intervention Category Intermediate Interventions: Pain - evaluation and management  Tera Partridge 12/22/2016, 3:31 PM

## 2016-12-22 NOTE — Plan of Care (Signed)
Problem: Pain Managment: Goal: General experience of comfort will improve Outcome: Progressing Patient takes Tramadol at home for chronic pain from arthritis; order for this obtained   Problem: Activity: Goal: Risk for activity intolerance will decrease Outcome: Progressing Patient at baseline level of activity per caregiver; up with max assist and walker for short periods   Problem: Fluid Volume: Goal: Ability to maintain a balanced intake and output will improve Outcome: Progressing Continues to diurese   Problem: Nutritional: Goal: Intake of prescribed amount of daily calories will improve Outcome: Progressing Diet advanced to 2 gram na

## 2016-12-22 NOTE — Progress Notes (Signed)
Patient has voided since foley catheter removed, purewick in place d/t patients difficulties getting up (requires max assist from 2-3 people to get patient out of bed and standing).  Per her sitters this is close to baseline for patient.  Large BM this shift.  RN did get Tramadol ordered for patient as she takes this at home for her arthritis pain.  Tylenol given to patient with some relief.  Sitter at bedside entire shift.

## 2016-12-23 DIAGNOSIS — I509 Heart failure, unspecified: Secondary | ICD-10-CM

## 2016-12-23 DIAGNOSIS — J9621 Acute and chronic respiratory failure with hypoxia: Secondary | ICD-10-CM

## 2016-12-23 DIAGNOSIS — E119 Type 2 diabetes mellitus without complications: Secondary | ICD-10-CM

## 2016-12-23 DIAGNOSIS — I5033 Acute on chronic diastolic (congestive) heart failure: Secondary | ICD-10-CM

## 2016-12-23 DIAGNOSIS — Z9989 Dependence on other enabling machines and devices: Secondary | ICD-10-CM

## 2016-12-23 DIAGNOSIS — G4733 Obstructive sleep apnea (adult) (pediatric): Secondary | ICD-10-CM

## 2016-12-23 DIAGNOSIS — E876 Hypokalemia: Secondary | ICD-10-CM

## 2016-12-23 DIAGNOSIS — R627 Adult failure to thrive: Secondary | ICD-10-CM

## 2016-12-23 DIAGNOSIS — Z6841 Body Mass Index (BMI) 40.0 and over, adult: Secondary | ICD-10-CM

## 2016-12-23 LAB — GLUCOSE, CAPILLARY
GLUCOSE-CAPILLARY: 91 mg/dL (ref 65–99)
GLUCOSE-CAPILLARY: 111 mg/dL — AB (ref 65–99)
Glucose-Capillary: 181 mg/dL — ABNORMAL HIGH (ref 65–99)
Glucose-Capillary: 184 mg/dL — ABNORMAL HIGH (ref 65–99)
Glucose-Capillary: 196 mg/dL — ABNORMAL HIGH (ref 65–99)

## 2016-12-23 MED ORDER — INSULIN ASPART 100 UNIT/ML ~~LOC~~ SOLN
2.0000 [IU] | Freq: Three times a day (TID) | SUBCUTANEOUS | Status: DC
  Administered 2016-12-23 – 2016-12-24 (×3): 4 [IU] via SUBCUTANEOUS
  Administered 2016-12-24: 6 [IU] via SUBCUTANEOUS
  Administered 2016-12-24: 4 [IU] via SUBCUTANEOUS
  Administered 2016-12-25: 6 [IU] via SUBCUTANEOUS
  Administered 2016-12-25 (×2): 4 [IU] via SUBCUTANEOUS
  Administered 2016-12-26: 6 [IU] via SUBCUTANEOUS
  Administered 2016-12-26: 4 [IU] via SUBCUTANEOUS
  Administered 2016-12-26 – 2016-12-27 (×2): 6 [IU] via SUBCUTANEOUS
  Administered 2016-12-27 (×2): 4 [IU] via SUBCUTANEOUS
  Administered 2016-12-28 (×3): 6 [IU] via SUBCUTANEOUS
  Administered 2016-12-29: 4 [IU] via SUBCUTANEOUS
  Administered 2016-12-29 (×2): 6 [IU] via SUBCUTANEOUS

## 2016-12-23 MED ORDER — SALINE SPRAY 0.65 % NA SOLN
1.0000 | NASAL | Status: DC | PRN
  Administered 2016-12-26 – 2016-12-27 (×2): 1 via NASAL
  Filled 2016-12-23: qty 44

## 2016-12-23 NOTE — Care Management Important Message (Signed)
Important Message  Patient Details  Name: Makayla Rodriguez MRN: [id number] Date of Birth: 04-Mar-1932   Medicare Important Message Given:  Yes    Tredarius Cobern Montine Circle 12/23/2016, 2:41 PM

## 2016-12-23 NOTE — Progress Notes (Signed)
Results for HAILY, CALEY (MRN [id number]) as of 12/23/2016 08:45  Ref. Range 12/22/2016 16:37 12/22/2016 21:14 12/22/2016 23:47 12/23/2016 04:56 12/23/2016 07:36  Glucose-Capillary Latest Ref Range: 65 - 99 mg/dL 239 (H) 215 (H) 154 (H) 91 111 (H)  Noted that patient is on ICU hyperglycemia protocol.  Recommend changing to Novolog SENSITIVE correction scale TID & HS if patient is eating and is now on a regular floor.   Harvel Ricks RN BSN CDE Diabetes Coordinator Pager: [phone]  8am-5pm

## 2016-12-23 NOTE — Progress Notes (Signed)
PROGRESS NOTE  Makayla Rodriguez LKG:[id number] DOB: [birth year] DOA: 12/19/2016 PCP: Darcus Austin, MD  HPI/Recap of past 24 hours:  Feeling better, edema is down, denies pain, family at bedside  Assessment/Plan: Active Problems:   Acute diastolic heart failure, NYHA class 2 (HCC)   Diabetes mellitus ([place])   OA (osteoarthritis) of ankle   Hypertension   Morbid obesity ([place])   Pleural mass   Obesity hypoventilation syndrome (HCC)   Cancer of upper-outer quadrant of female breast ([place])   Morbid obesity with body mass index of 45.0-49.9 in adult [facility])   Chronic respiratory failure (HCC)   Acute exacerbation of CHF (congestive heart failure) (HCC)   Acute on Chronic respiratory failure(on home o2 2liters) - likely due to Acute on chronic diastolic heart failure, OSA, obesity hypoventilation  -Patient was admitted to hospitalist service on 7/23 with orthopnea and dyspnea  And weight gain, CXR small L pleural effusion , Osats 70s in RA,she is transferred to icu due to respiratory distress and intubated on 7/24 am -she is diuresed, has improved, extubated on 7/25, transferred  Back to hospitalist service on 7/27. -   continue lasix 40mg  bid, transition to oral hopefully tomorrow.  noninsulin dependent dm2,  A1c7.7 Hold home oral diabetic medications.  On meal coverage insulin in the hospital Am blood sugar 101-104  Hypertension, bp stable on lasix/bystolic  Hyperlipidemia Continue home statins   Anemia of chronic disease  Hemoglobin stable at  10.4 .   Depression Continue home Zoloft    Morbid obesity: Body mass index is 50.36 kg/m.   FTT; will get PT/OT eval  Code Status: full  Family Communication: patient  And family at bedside  Disposition Plan: home with home health   Consultants:  Patient was admitted to hospitalist service on 7/23, transferred to icu due to respiratory distress and intubated on 7/24 am  Patient is extubated and transferred back  to hospitalist service on 7/27  Procedures:  Intubation on 7/24 am, extubation on 7/25 am  Antibiotics:  none   Objective: BP (!) 147/70 (BP Location: Right Arm)   Pulse 90   Temp 98.6 F (37 C) (Oral)   Resp 18   Ht 5\' 3"  (1.6 m)   Wt 129 kg (284 lb 4.8 oz)   SpO2 94%   BMI 50.36 kg/m   Intake/Output Summary (Last 24 hours) at 12/23/16 1935 Last data filed at 12/23/16 1859  Gross per 24 hour  Intake              603 ml  Output             1750 ml  Net            -1147 ml   Filed Weights   12/22/16 0500 12/22/16 1150 12/23/16 0559  Weight: 128.2 kg (282 lb 10.1 oz) 126.6 kg (279 lb 3 oz) 129 kg (284 lb 4.8 oz)    Exam:   General:  Obese, NAD  Cardiovascular: RRR  Respiratory: diminished at basis, no wheezing, no rales, no rhonchi  Abdomen: Soft/ND/NT, positive BS  Musculoskeletal: bilateral lower extremity Edema has largely resolved  Neuro: aaox3  Data Reviewed: Basic Metabolic Panel:  Recent Labs Lab 12/19/16 0728 12/20/16 0747 12/21/16 1100 12/22/16 0216  NA 141 143 145 145  K 4.4 4.4 3.3* 3.5  CL 95* 93* 93* 93*  CO2 38* 36* 38* 38*  GLUCOSE 167* 182* 101* 104*  BUN 34* 30* 31* 31*  CREATININE 0.92 0.95 1.05*  0.94  CALCIUM 9.5 9.5 9.4 9.0  MG  --   --   --  1.8  PHOS  --   --   --  3.3   Liver Function Tests:  Recent Labs Lab 12/19/16 0728  AST 13*  ALT 10*  ALKPHOS 39  BILITOT 0.7  PROT 6.8  ALBUMIN 3.4*   No results for input(s): LIPASE, AMYLASE in the last 168 hours. No results for input(s): AMMONIA in the last 168 hours. CBC:  Recent Labs Lab 12/19/16 0728 12/20/16 0747 12/21/16 1100 12/22/16 0216  WBC 8.6 12.3* 10.1 8.4  NEUTROABS 5.8  --   --   --   HGB 10.4* 10.8* 11.0* 10.2*  HCT 34.8* 34.5* 35.6* 33.9*  MCV 96.7 97.5 94.7 95.2  PLT 174 133* 210 223   Cardiac Enzymes:    Recent Labs Lab 12/19/16 1653 12/19/16 2214  TROPONINI 0.05* <0.03   BNP (last 3 results)  Recent Labs  12/19/16 0729  BNP  431.8*    ProBNP (last 3 results) No results for input(s): PROBNP in the last 8760 hours.  CBG:  Recent Labs Lab 12/22/16 2347 12/23/16 0456 12/23/16 0736 12/23/16 1133 12/23/16 1607  GLUCAP 154* 91 111* 181* 184*    Recent Results (from the past 240 hour(s))  MRSA PCR Screening     Status: None   Collection Time: 12/20/16  7:20 AM  Result Value Ref Range Status   MRSA by PCR NEGATIVE NEGATIVE Final    Comment:        The GeneXpert MRSA Assay (FDA approved for NASAL specimens only), is one component of a comprehensive MRSA colonization surveillance program. It is not intended to diagnose MRSA infection nor to guide or monitor treatment for MRSA infections.      Studies: No results found.  Scheduled Meds: . aspirin EC  81 mg Oral q morning - 10a  . atorvastatin  10 mg Oral q1800  . azelastine  1 spray Each Nare BID  . enoxaparin (LOVENOX) injection  40 mg Subcutaneous Q24H  . fluticasone  1 spray Each Nare Daily  . furosemide  40 mg Intravenous BID  . insulin aspart  2-6 Units Subcutaneous TID WC  . mouth rinse  15 mL Mouth Rinse BID  . nebivolol  5 mg Oral Daily  . pantoprazole  40 mg Oral Daily  . sertraline  75 mg Oral Daily  . sodium chloride flush  3 mL Intravenous Q12H    Continuous Infusions: . sodium chloride 250 mL (12/20/16 0800)     Time spent: 85mins  Randie Tallarico MD, PhD  [facility] Pager [phone]. If 7PM-7AM, please contact night-coverage at [email or web address], password [facility] 12/23/2016, 7:35 PM  LOS: 3 days

## 2016-12-23 NOTE — Care Management Note (Signed)
Case Management Note  Patient Details  Name: Makayla Rodriguez MRN: [id number] Date of Birth: 06/10/1931  Subjective/Objective:    CHF               Action/Plan: CM talked to patient at the bedside; PCP: Darcus Austin, MD; has private insurance with Medicare/ AARP with prescription drug coverage; DCP - return home with continuation of care with 24 hr caregivers; DME- home oxygen, CPAP, she states that she does not want a hospital bed at this time; CM informed patient that when she decides that she wants a hospital bed to call her PCP and the can make the referral to the DME company; No needs identified at this time. CM will continue to follow for DCP.  Expected Discharge Date:     Possibly 12/26/2016             Expected Discharge Plan:  Home/Self Care  In-House Referral:   [facility]  Discharge planning Services  CM Consult    Status of Service:  In process, will continue to follow  Sherrilyn Rist [phone] 12/23/2016, 12:17 PM

## 2016-12-24 LAB — GLUCOSE, CAPILLARY
GLUCOSE-CAPILLARY: 200 mg/dL — AB (ref 65–99)
Glucose-Capillary: 186 mg/dL — ABNORMAL HIGH (ref 65–99)
Glucose-Capillary: 208 mg/dL — ABNORMAL HIGH (ref 65–99)
Glucose-Capillary: 273 mg/dL — ABNORMAL HIGH (ref 65–99)

## 2016-12-24 LAB — BASIC METABOLIC PANEL
ANION GAP: 9 (ref 5–15)
BUN: 27 mg/dL — ABNORMAL HIGH (ref 6–20)
CALCIUM: 8.8 mg/dL — AB (ref 8.9–10.3)
CHLORIDE: 95 mmol/L — AB (ref 101–111)
CO2: 39 mmol/L — AB (ref 22–32)
Creatinine, Ser: 0.96 mg/dL (ref 0.44–1.00)
GFR calc Af Amer: >60 mL/min (ref >60)
GFR calc non Af Amer: 53 mL/min — ABNORMAL LOW (ref >60)
GLUCOSE: 165 mg/dL — AB (ref 65–99)
Potassium: 3.2 mmol/L — ABNORMAL LOW (ref 3.5–5.1)
Sodium: 143 mmol/L (ref 135–145)

## 2016-12-24 LAB — MAGNESIUM: Magnesium: 1.9 mg/dL (ref 1.7–2.4)

## 2016-12-24 MED ORDER — POTASSIUM CHLORIDE CRYS ER 20 MEQ PO TBCR
40.0000 meq | EXTENDED_RELEASE_TABLET | Freq: Once | ORAL | Status: AC
  Administered 2016-12-24: 40 meq via ORAL
  Filled 2016-12-24: qty 2

## 2016-12-24 NOTE — Progress Notes (Signed)
PROGRESS NOTE  Makayla Rodriguez ZWC:[id number] DOB: 10/26/1931 DOA: 12/19/2016 PCP: Darcus Austin, MD  HPI/Recap of past 24 hours:  She is sitting up in chair, now new complaints, no fever, no chest pain, edema is down,  family at bedside  Assessment/Plan: Active Problems:   Acute diastolic heart failure, NYHA class 2 (HCC)   Diabetes mellitus ([place])   OA (osteoarthritis) of ankle   Hypertension   Morbid obesity ([place])   Pleural mass   Obesity hypoventilation syndrome (HCC)   Cancer of upper-outer quadrant of female breast ([place])   Morbid obesity with body mass index of 45.0-49.9 in adult [facility])   Chronic respiratory failure (HCC)   Acute exacerbation of CHF (congestive heart failure) (HCC)  Acute on Chronic respiratory failure with hypoxia and hypercapnia (on home o2 2liters) - likely due to Acute on chronic diastolic heart failure, OSA, obesity hypoventilation  -Patient was admitted to hospitalist service on 7/23 with significant edema, orthopnea and dyspnea  and weight gain, CXR small L pleural effusion , Osats 70s in RA,she was transferred to icu due to respiratory distress and intubated on 7/24 am -she is diuresed, has improved, extubated on 7/25, transferred  Back to hospitalist service on 7/27. -  continue lasix 40mg  bid, cr stable, will continue another day, transition to oral hopefully tomorrow.  Hypokalemia: replace k, check mag   noninsulin dependent dm2,  A1c7.7 Hold home oral diabetic medications.  On meal coverage insulin in the hospital Am blood sugar 101-104  Hypertension, bp stable on lasix/bystolic  Hyperlipidemia Continue home statins   Anemia of chronic disease  Hemoglobin stable at  10.4 .   Depression Continue home Zoloft    Morbid obesity:/OSA Body mass index is 48.66 kg/m. on cpap   FTT;  PT/OT eval  Code Status: full  Family Communication: patient  And family at bedside  Disposition Plan: home with home health in 24-48  hrs   Consultants:  Patient was admitted to hospitalist service on 7/23, transferred to icu due to respiratory distress and intubated on 7/24 am  Patient is extubated and transferred back to hospitalist service on 7/27  Procedures:  Intubation on 7/24 am, extubation on 7/25 am  Antibiotics:  none   Objective: BP (!) 127/45 (BP Location: Right Arm)   Pulse (!) 55   Temp 98.5 F (36.9 C) (Oral)   Resp 18   Ht 5\' 3"  (1.6 m)   Wt 124.6 kg (274 lb 11.2 oz)   SpO2 97%   BMI 48.66 kg/m   Intake/Output Summary (Last 24 hours) at 12/24/16 1550 Last data filed at 12/24/16 0834  Gross per 24 hour  Intake              600 ml  Output              600 ml  Net                0 ml   Filed Weights   12/22/16 1150 12/23/16 0559 12/24/16 0521  Weight: 126.6 kg (279 lb 3 oz) 129 kg (284 lb 4.8 oz) 124.6 kg (274 lb 11.2 oz)    Exam:   General:  Obese, NAD  Cardiovascular: RRR  Respiratory: diminished at basis, no wheezing, no rales, no rhonchi  Abdomen: Soft/ND/NT, positive BS  Musculoskeletal: bilateral lower extremity Edema has largely resolved  Neuro: aaox3  Data Reviewed: Basic Metabolic Panel:  Recent Labs Lab 12/19/16 0728 12/20/16 0747 12/21/16 1100 12/22/16 0216 12/24/16 0507  NA 141 143 145 145 143  K 4.4 4.4 3.3* 3.5 3.2*  CL 95* 93* 93* 93* 95*  CO2 38* 36* 38* 38* 39*  GLUCOSE 167* 182* 101* 104* 165*  BUN 34* 30* 31* 31* 27*  CREATININE 0.92 0.95 1.05* 0.94 0.96  CALCIUM 9.5 9.5 9.4 9.0 8.8*  MG  --   --   --  1.8 1.9  PHOS  --   --   --  3.3  --    Liver Function Tests:  Recent Labs Lab 12/19/16 0728  AST 13*  ALT 10*  ALKPHOS 39  BILITOT 0.7  PROT 6.8  ALBUMIN 3.4*   No results for input(s): LIPASE, AMYLASE in the last 168 hours. No results for input(s): AMMONIA in the last 168 hours. CBC:  Recent Labs Lab 12/19/16 0728 12/20/16 0747 12/21/16 1100 12/22/16 0216  WBC 8.6 12.3* 10.1 8.4  NEUTROABS 5.8  --   --   --   HGB  10.4* 10.8* 11.0* 10.2*  HCT 34.8* 34.5* 35.6* 33.9*  MCV 96.7 97.5 94.7 95.2  PLT 174 133* 210 223   Cardiac Enzymes:    Recent Labs Lab 12/19/16 1653 12/19/16 2214  TROPONINI 0.05* <0.03   BNP (last 3 results)  Recent Labs  12/19/16 0729  BNP 431.8*    ProBNP (last 3 results) No results for input(s): PROBNP in the last 8760 hours.  CBG:  Recent Labs Lab 12/23/16 1133 12/23/16 1607 12/23/16 2147 12/24/16 0732 12/24/16 1204  GLUCAP 181* 184* 196* 186* 200*    Recent Results (from the past 240 hour(s))  MRSA PCR Screening     Status: None   Collection Time: 12/20/16  7:20 AM  Result Value Ref Range Status   MRSA by PCR NEGATIVE NEGATIVE Final    Comment:        The GeneXpert MRSA Assay (FDA approved for NASAL specimens only), is one component of a comprehensive MRSA colonization surveillance program. It is not intended to diagnose MRSA infection nor to guide or monitor treatment for MRSA infections.      Studies: No results found.  Scheduled Meds: . aspirin EC  81 mg Oral q morning - 10a  . atorvastatin  10 mg Oral q1800  . azelastine  1 spray Each Nare BID  . enoxaparin (LOVENOX) injection  40 mg Subcutaneous Q24H  . fluticasone  1 spray Each Nare Daily  . furosemide  40 mg Intravenous BID  . insulin aspart  2-6 Units Subcutaneous TID WC  . mouth rinse  15 mL Mouth Rinse BID  . nebivolol  5 mg Oral Daily  . pantoprazole  40 mg Oral Daily  . sertraline  75 mg Oral Daily  . sodium chloride flush  3 mL Intravenous Q12H    Continuous Infusions: . sodium chloride 250 mL (12/20/16 0800)     Time spent: 66mins  Emileo Semel MD, PhD  [facility] Pager [phone]. If 7PM-7AM, please contact night-coverage at [email or web address], password [facility] 12/24/2016, 3:50 PM  LOS: 4 days

## 2016-12-24 NOTE — Evaluation (Signed)
Physical Therapy Evaluation Patient Details Name: Makayla Rodriguez MRN: [id number] DOB: [birth year] Today's Date: 12/24/2016   History of Present Illness  83 year old female with PMH significant for CHF, Anxiety, CKD III, DM, GERD, OHS on home O2,and OSA on CPAP. She presented to [facility] 7/24 with SOB and peripheral edema. She was admitted to the hospitalist service for respiratory failure secondary to CHF exacerbation. worsening resp distress and transferred to ICU and intubated 7/24, extubated 7/25  Clinical Impression   Pt admitted with above diagnosis. Pt currently with functional limitations due to the deficits listed below (see PT Problem List). Presents with functional dependencies, requiring at least Mod assist for basic functional mobility and transfers; She does have 24 hour assistance in the home; While post-acute rehab at SNF level is indicated, she clearly would rather dc home, which is not unreasonable, as she has 24 hour assist in the home; HOme will likely be the most therapeutic place for Ms. Brott;  Pt will benefit from skilled PT to increase their independence and safety with mobility to allow discharge to the venue listed below.       Follow Up Recommendations Home health PT;Supervision/Assistance - 24 hour (prn +2 assist)    Equipment Recommendations  Other (comment) (they are requesting a larger transport chair)    Recommendations for Other Services       Precautions / Restrictions Precautions Precautions: Fall Precaution Comments:   Restrictions Weight Bearing Restrictions: No      Mobility  Bed Mobility Overal bed mobility: Needs Assistance Bed Mobility: Supine to Sit     Supine to sit: Max assist;Mod assist;+2 for physical assistance;HOB elevated     General bed mobility comments: Total A to advance BLE across bed. One therapist in front, one behind pt, each giving mod A to powerup to EOB.  Utilited bed pad to scoot hips forward to full EOB position. Of  note, pt sleeps in lift chair at home.   Transfers Overall transfer level: Needs assistance Equipment used: Rolling walker (2 wheeled) Transfers: Sit to/from [facility] Sit to Stand: Mod assist;+2 physical assistance Stand pivot transfers: Mod assist;+2 physical assistance;Min assist       General transfer comment: Mod A to powerup to standing from EOB and to control descent into recliner. Min A during pivoting. +2 to scoot back into recliner once seated.   Ambulation/Gait Ambulation/Gait assistance: Mod assist;+2 safety/equipment Ambulation Distance (Feet):  (Small steps bed to chair including pivot steps) Assistive device: Rolling walker (2 wheeled) Gait Pattern/deviations: Patent attorney    Modified Rankin (Stroke Patients Only)       Balance Overall balance assessment: Needs assistance Sitting-balance support: Feet supported;No upper extremity supported Sitting balance-Leahy Scale: Good     Standing balance support: Bilateral upper extremity supported;During functional activity Standing balance-Leahy Scale: Poor Standing balance comment: rw and min A to steady balance                             Pertinent Vitals/Pain Pain Assessment: Faces Faces Pain Scale: Hurts little more Pain Location: not specified; audible crepitus from arthritis Pain Descriptors / Indicators: Grimacing (with movement, arthritic) Pain Intervention(s): Limited activity within patient's tolerance;Monitored during session;Repositioned    Home Living Family/patient expects to be discharged to:: Private residence Living Arrangements: Alone Available Help at Discharge: Home health;Available  24 hours/day Type of Home: House Home Access: Level entry     Home Layout: One level Home Equipment: Walker - 4 wheels;Shower seat - built in G[employer]er) Additional Comments: sleeps in lift/recliner chair. usually sponge  bathes with help of aide.HHA during the day is Congress who was present during session. Another HHA comes in at night.     Prior Function Level of Independence: Independent with assistive device(s);Needs assistance   Gait / Transfers Assistance Needed: rollator and HHA walks with her  ADL's / Homemaking Assistance Needed: mod A for LB ADLs, min-mod A for LB ADLs. +1 assist for functional mobility/transfers. Pt reports long walk to bathroom.         Hand Dominance        Extremity/Trunk Assessment   Upper Extremity Assessment Upper Extremity Assessment: Defer to OT evaluation LUE Deficits / Details: audible arthritic sounds with movement at shoulder level.     Lower Extremity Assessment Lower Extremity Assessment: Generalized weakness (Audible arthritic crepitus)       Communication   Communication: No difficulties  Cognition Arousal/Alertness: Awake/alert Behavior During Therapy: Anxious Overall Cognitive Status:  (anxiety vs cognition? appears at baseline, reduce distractio)                                 General Comments: Benefits from doing things one step at a time with decreased distractions and with encouragement. Easily overwhelmed. STM deficits likely at baseline. HHA present during session.       General Comments General comments (skin integrity, edema, etc.): Pt on 4L O2 via [place] during session with sats 90-92 at start and end of session. Pt verbalizing concern/discomfort of Lasik-related frequent urinary incontinence and voiding during transfer.     Exercises     Assessment/Plan    PT Assessment Patient needs continued PT services  PT Problem List Decreased strength;Decreased activity tolerance;Decreased range of motion;Decreased balance;Decreased mobility;Decreased coordination;Decreased cognition;Decreased knowledge of use of DME;Decreased safety awareness;Decreased knowledge of precautions;Cardiopulmonary status limiting activity       PT  Treatment Interventions DME instruction;Gait training;Functional mobility training;Therapeutic activities;Therapeutic exercise;Balance training;Neuromuscular re-education;Cognitive remediation;Patient/family education    PT Goals (Current goals can be found in the Care Plan section)  Acute Rehab PT Goals Patient Stated Goal: home at d/c.  PT Goal Formulation: With patient Time For Goal Achievement: 01/07/17 Potential to Achieve Goals: Good    Frequency Min 3X/week   Barriers to discharge Other (comment) This session we benefitted from having 2 people to assist at times; Ms. Bobst and her Aide, Delana Meyer, tell us that a second person can be available at times; Still, i'm hopeful that in her own home environment, with her own equipment, she will need less assist    Co-evaluation PT/OT/SLP Co-Evaluation/Treatment: Yes Reason for Co-Treatment: For patient/therapist safety PT goals addressed during session: Mobility/safety with mobility OT goals addressed during session: ADL's and self-care       AM-PAC PT "6 Clicks" Daily Activity  Outcome Measure Difficulty turning over in bed (including adjusting bedclothes, sheets and blankets)?: Total Difficulty moving from lying on back to sitting on the side of the bed? : Total Difficulty sitting down on and standing up from a chair with arms (e.g., wheelchair, bedside commode, etc,.)?: Total Help needed moving to and from a bed to chair (including a wheelchair)?: A Lot Help needed walking in hospital room?: A Lot Help needed climbing 3-5 steps with a railing? :  Total 6 Click Score: 8    End of Session Equipment Utilized During Treatment: Gait belt;Oxygen Activity Tolerance: Patient tolerated treatment well Patient left: in chair;with call bell/phone within reach;with family/visitor present Nurse Communication: Mobility status PT Visit Diagnosis: Other abnormalities of gait and mobility (R26.89);Muscle weakness (generalized) (M62.81)    Time:  8144-8185 PT Time Calculation (min) (ACUTE ONLY): 43 min   Charges:   PT Evaluation $PT Eval Moderate Complexity: 1 Procedure     PT G Codes:        Roney Marion, PT  Acute Rehabilitation Services Pager [phone] Office [phone]   [place] 12/24/2016, 2:04 PM

## 2016-12-24 NOTE — Evaluation (Signed)
Occupational Therapy Evaluation Patient Details Name: Makayla Rodriguez MRN: [id number] DOB: [birth year] Today's Date: 12/24/2016    History of Present Illness 83 year old female with PMH as below, which is significant for CHF, Anxiety, CKD III, DM, GERD, OHS on home O2,and OSA on CPAP. She presented to [facility] 7/24 with SOB and peripheral edema. She was admitted to the hospitalist service for respiratory failure secondary to CHF exacerbation. worsening resp distress and transferred to ICU and intubated 7/24, extubated 7/25   Clinical Impression   Pt admitted with the above diagnoses and presents with below problem list. Pt will benefit from continued acute OT to address the below listed deficits and maximize independence with basic ADLs prior to d/cto venue below. PTA pt was utilizing HHA assistance day and night, likely mod A for LB ADLs, min - mod A with functional mobility/transfers. Pt is currently needing +2 min - mod assist for functional transfers and mobility. Pt is wanting to d/c home. Pt on 4L supplemental O2 via [place] with sats 90-92 at start and end of session.      Follow Up Recommendations  Home health OT;Supervision/Assistance - 24 hour;Other (comment) (+2 assist if needed at d/c)    Equipment Recommendations  3 in 1 bedside commode    Recommendations for Other Services       Precautions / Restrictions Precautions Precautions: Fall Precaution Comments:   Restrictions Weight Bearing Restrictions: No      Mobility Bed Mobility Overal bed mobility: Needs Assistance Bed Mobility: Supine to Sit     Supine to sit: Max assist;Mod assist;+2 for physical assistance;HOB elevated     General bed mobility comments: Total A to advance BLE across bed. One therapist in front, one behind pt, each giving mod A to powerup to EOB.  Utilited bed pad to scoot hips forward to full EOB position. Of note, pt sleeps in lift chair at home.   Transfers Overall transfer level: Needs  assistance Equipment used: Rolling walker (2 wheeled) Transfers: Sit to/from [facility] Sit to Stand: Mod assist;+2 physical assistance Stand pivot transfers: Mod assist;+2 physical assistance;Min assist       General transfer comment: Mod A to powerup to standing from EOB and to control descent into recliner. Min A during pivoting. +2 to scoot back into recliner once seated.     Balance Overall balance assessment: Needs assistance Sitting-balance support: Feet supported;No upper extremity supported Sitting balance-Leahy Scale: Good     Standing balance support: Bilateral upper extremity supported;During functional activity Standing balance-Leahy Scale: Poor Standing balance comment: rw and min A to steady balance                           ADL either performed or assessed with clinical judgement   ADL Overall ADL's : Needs assistance/impaired Eating/Feeding: Set up;Sitting   Grooming: Set up;Sitting   Upper Body Bathing: Moderate assistance;Sitting   Lower Body Bathing: +2 for physical assistance;Moderate assistance;Sit to/from stand   Upper Body Dressing : Moderate assistance;Sitting   Lower Body Dressing: Moderate assistance;+2 for physical assistance   Toilet Transfer: Minimal assistance;+2 for physical assistance;Moderate assistance;Stand-pivot Toilet Transfer Details (indicate cue type and reason): min A during pivoting, mod for sit<>stand portion of transfer. Toileting- Clothing Manipulation and Hygiene: Moderate assistance;+2 for physical assistance;Sit to/from stand   Tub/ Banker: Moderate assistance;+2 for physical assistance;Stand-pivot   Functional mobility during ADLs:  (pivotal steps only with min +2 A, rw) General  ADL Comments: Pt completed bed mobility and SPT to recliner as detailed above. Jasmine, [place], present and involved during session.      Vision         Perception     Praxis      Pertinent Vitals/Pain  Pain Assessment: Faces Faces Pain Scale: Hurts little more Pain Location: not specified Pain Descriptors / Indicators: Grimacing (with movement, arthritic) Pain Intervention(s): Limited activity within patient's tolerance;Monitored during session;Repositioned     Hand Dominance     Extremity/Trunk Assessment Upper Extremity Assessment Upper Extremity Assessment: Generalized weakness;LUE deficits/detail LUE Deficits / Details: audible arthritic sounds with movement at shoulder level.    Lower Extremity Assessment Lower Extremity Assessment: Defer to PT evaluation       Communication Communication Communication: No difficulties   Cognition Arousal/Alertness: Awake/alert Behavior During Therapy: Anxious Overall Cognitive Status:  (anxiety vs cognition? appears at baseline, reduce distractio)                                 General Comments: Benefits from doing things one step at a time with decreased distractions and with encouragement. Easily overwhelmed. STM deficits likely at baseline. HHA present during session.    General Comments  Pt on 4L O2 via [place] during session with sats 90-92 at start and end of session. Pt verbalizing concern/discomfort of Lasik-related frequent urinary incontinence and voiding during transfer.     Exercises     Shoulder Instructions      Home Living Family/patient expects to be discharged to:: Private residence Living Arrangements: Alone Available Help at Discharge: Home health;Available 24 hours/day Type of Home: House Home Access: Level entry     Home Layout: One level     Bathroom Shower/Tub: Walk-in shower         Home Equipment: Environmental consultant - 4 wheels;Shower seat - built in [employer])   Additional Comments: sleeps in lift/recliner chair. usually sponge bathes with help of aide.HHA during the day is [place] who was present during session. Another HHA comes in at night.       Prior Functioning/Environment Level  of Independence: Independent with assistive device(s);Needs assistance  Gait / Transfers Assistance Needed: rollator and HHA walks with her ADL's / Homemaking Assistance Needed: mod A for LB ADLs, min-mod A for LB ADLs. +1 assist for functional mobility/transfers. Pt reports long walk to bathroom.             OT Problem List: Impaired balance (sitting and/or standing);Decreased knowledge of use of DME or AE;Decreased knowledge of precautions;Pain;Decreased activity tolerance;Decreased strength;Decreased cognition;Cardiopulmonary status limiting activity;Obesity      OT Treatment/Interventions: Self-care/ADL training;Therapeutic exercise;Energy conservation;DME and/or AE instruction;Therapeutic activities;Cognitive remediation/compensation;Patient/family education;Balance training    OT Goals(Current goals can be found in the care plan section) Acute Rehab OT Goals Patient Stated Goal: home at d/c.  OT Goal Formulation: With patient Time For Goal Achievement: 01/07/17 Potential to Achieve Goals: Good ADL Goals Pt Will Perform Lower Body Bathing: with mod assist;sit to/from stand Pt Will Perform Lower Body Dressing: with mod assist;sit to/from stand Pt Will Transfer to Toilet: with mod assist;ambulating Pt Will Perform Toileting - Clothing Manipulation and hygiene: with mod assist;sit to/from stand Pt Will Perform Tub/Shower Transfer: with mod assist;ambulating;shower seat;rolling walker  OT Frequency: Min 2X/week   Barriers to D/C:    Discussed potentially needing +2 assist at home at time of d/c. Pt feels she can arrange this. No  current plan in place.       Co-evaluation PT/OT/SLP Co-Evaluation/Treatment: Yes Reason for Co-Treatment: For patient/therapist safety;To address functional/ADL transfers   OT goals addressed during session: ADL's and self-care      AM-PAC PT "6 Clicks" Daily Activity     Outcome Measure Help from another person eating meals?: None Help from  another person taking care of personal grooming?: A Little Help from another person toileting, which includes using toliet, bedpan, or urinal?: A Lot Help from another person bathing (including washing, rinsing, drying)?: A Lot Help from another person to put on and taking off regular upper body clothing?: A Lot Help from another person to put on and taking off regular lower body clothing?: A Lot 6 Click Score: 15   End of Session Equipment Utilized During Treatment: Gait belt;Rolling walker;Oxygen  Activity Tolerance: Patient limited by fatigue Patient left: in chair;with call bell/phone within reach;with chair alarm set;with family/visitor present  OT Visit Diagnosis: History of falling (Z91.81);Muscle weakness (generalized) (M62.81);Other abnormalities of gait and mobility (R26.89);Pain                Time: 1045-1130 OT Time Calculation (min): 45 min Charges:  OT General Charges $OT Visit: 1 Procedure OT Evaluation $OT Eval Moderate Complexity: 1 Procedure OT Treatments $Self Care/Home Management : 8-22 mins G-Codes:       Hortencia Pilar 12/24/2016, 12:31 PM

## 2016-12-25 LAB — BASIC METABOLIC PANEL
ANION GAP: 9 (ref 5–15)
BUN: 35 mg/dL — ABNORMAL HIGH (ref 6–20)
CALCIUM: 8.8 mg/dL — AB (ref 8.9–10.3)
CHLORIDE: 95 mmol/L — AB (ref 101–111)
CO2: 37 mmol/L — ABNORMAL HIGH (ref 22–32)
Creatinine, Ser: 1.05 mg/dL — ABNORMAL HIGH (ref 0.44–1.00)
GFR calc Af Amer: 55 mL/min — ABNORMAL LOW (ref >60)
GFR, EST NON AFRICAN AMERICAN: 48 mL/min — AB (ref >60)
GLUCOSE: 188 mg/dL — AB (ref 65–99)
POTASSIUM: 4.6 mmol/L (ref 3.5–5.1)
Sodium: 141 mmol/L (ref 135–145)

## 2016-12-25 LAB — MAGNESIUM: MAGNESIUM: 1.9 mg/dL (ref 1.7–2.4)

## 2016-12-25 LAB — GLUCOSE, CAPILLARY
GLUCOSE-CAPILLARY: 198 mg/dL — AB (ref 65–99)
GLUCOSE-CAPILLARY: 314 mg/dL — AB (ref 65–99)
Glucose-Capillary: 190 mg/dL — ABNORMAL HIGH (ref 65–99)
Glucose-Capillary: 186 mg/dL — ABNORMAL HIGH (ref 65–99)
Glucose-Capillary: 207 mg/dL — ABNORMAL HIGH (ref 65–99)

## 2016-12-25 LAB — LIPID PANEL
Cholesterol: 117 mg/dL (ref 0–200)
HDL: 34 mg/dL — AB (ref >40)
LDL Cholesterol: 54 mg/dL (ref 0–99)
TRIGLYCERIDES: 146 mg/dL (ref <150)
Total CHOL/HDL Ratio: 3.4 RATIO
VLDL: 29 mg/dL (ref 0–40)

## 2016-12-25 MED ORDER — TORSEMIDE 20 MG PO TABS
40.0000 mg | ORAL_TABLET | Freq: Every day | ORAL | Status: DC
  Administered 2016-12-26: 40 mg via ORAL
  Filled 2016-12-25: qty 2

## 2016-12-25 NOTE — Progress Notes (Signed)
Patient very anxious when RT came to place CPAP on, she states that she would not like to put it on at this time, and will call when ready for it. RT will continue to monitor as needed.

## 2016-12-25 NOTE — Plan of Care (Signed)
Problem: Activity: Goal: Risk for activity intolerance will decrease Outcome: Progressing Up to chair with 2 max assist and walker (patients baseline)   Problem: Fluid Volume: Goal: Ability to maintain a balanced intake and output will improve Outcome: Progressing Continues to diurese

## 2016-12-25 NOTE — Progress Notes (Signed)
PROGRESS NOTE  Makayla Rodriguez TKZ:[id number] DOB: 09/09/1931 DOA: 12/19/2016 PCP: Darcus Austin, MD  HPI/Recap of past 24 hours:  Uneventful night no fever, no chest pain, edema is down,  family at bedside  Assessment/Plan: Active Problems:   Acute diastolic heart failure, NYHA class 2 ([place])   Diabetes mellitus ([place])   OA (osteoarthritis) of ankle   Hypertension   Morbid obesity ([place])   Pleural mass   Obesity hypoventilation syndrome (HCC)   Cancer of upper-outer quadrant of female breast ([place])   Morbid obesity with body mass index of 45.0-49.9 in adult [facility])   Chronic respiratory failure (HCC)   Acute exacerbation of CHF (congestive heart failure) (HCC)  Acute on Chronic respiratory failure with hypoxia and hypercapnia (on home o2 3liters at night, 2liter day time) - likely due to Acute on chronic diastolic heart failure, OSA, obesity hypoventilation  -Patient was admitted to hospitalist service on 7/23 with significant edema, orthopnea and dyspnea  and weight gain, CXR small L pleural effusion , Osats 70s in RA,she was transferred to icu due to respiratory distress and intubated on 7/24 am -she is diuresed, has improved, extubated on 7/25, transferred  Back to hospitalist service on 7/27. -  she received  lasix 40mg  bid, transition to oral torsamide 40mg  daily on 7/29, repeat bmp in am, likely able to d/c home on 7/30.  Hypokalemia:  k replaced,  Mag 1.9   noninsulin dependent dm2,  A1c7.7 Hold home oral diabetic medications.  On meal coverage insulin in the hospital Am blood sugar 101-104  Hypertension, bp stable on lasix/bystolic  Hyperlipidemia Continue home statins   Anemia of chronic disease  Hemoglobin stable at  10.4 .   Depression Continue home Zoloft    Morbid obesity:/OSA Body mass index is 48.5 kg/m. on cpap   FTT;  PT/OT eval  Code Status: full  Family Communication: patient  And family at bedside  Disposition Plan: home with home  health on 7/30   Consultants:  Patient was admitted to hospitalist service on 7/23, transferred to icu due to respiratory distress and intubated on 7/24 am  Patient is extubated and transferred back to hospitalist service on 7/27  Procedures:  Intubation on 7/24 am, extubation on 7/25 am  Antibiotics:  none   Objective: BP (!) 144/65 (BP Location: Right Wrist)   Pulse 69   Temp 98 F (36.7 C) (Oral)   Resp 18   Ht 5\' 3"  (1.6 m)   Wt 124.2 kg (273 lb 13 oz)   SpO2 97%   BMI 48.50 kg/m   Intake/Output Summary (Last 24 hours) at 12/25/16 1424 Last data filed at 12/25/16 0700  Gross per 24 hour  Intake              240 ml  Output              450 ml  Net             -210 ml   Filed Weights   12/23/16 0559 12/24/16 0521 12/25/16 0501  Weight: 129 kg (284 lb 4.8 oz) 124.6 kg (274 lb 11.2 oz) 124.2 kg (273 lb 13 oz)    Exam:   General:  Obese, NAD  Cardiovascular: RRR  Respiratory: diminished at basis, no wheezing, no rales, no rhonchi  Abdomen: Soft/ND/NT, positive BS  Musculoskeletal: bilateral lower extremity Edema has largely resolved  Neuro: aaox3  Data Reviewed: Basic Metabolic Panel:  Recent Labs Lab 12/20/16 0747 12/21/16 1100 12/22/16  0216 12/24/16 0507 12/25/16 0450  NA 143 145 145 143 141  K 4.4 3.3* 3.5 3.2* 4.6  CL 93* 93* 93* 95* 95*  CO2 36* 38* 38* 39* 37*  GLUCOSE 182* 101* 104* 165* 188*  BUN 30* 31* 31* 27* 35*  CREATININE 0.95 1.05* 0.94 0.96 1.05*  CALCIUM 9.5 9.4 9.0 8.8* 8.8*  MG  --   --  1.8 1.9 1.9  PHOS  --   --  3.3  --   --    Liver Function Tests:  Recent Labs Lab 12/19/16 0728  AST 13*  ALT 10*  ALKPHOS 39  BILITOT 0.7  PROT 6.8  ALBUMIN 3.4*   No results for input(s): LIPASE, AMYLASE in the last 168 hours. No results for input(s): AMMONIA in the last 168 hours. CBC:  Recent Labs Lab 12/19/16 0728 12/20/16 0747 12/21/16 1100 12/22/16 0216  WBC 8.6 12.3* 10.1 8.4  NEUTROABS 5.8  --   --   --     HGB 10.4* 10.8* 11.0* 10.2*  HCT 34.8* 34.5* 35.6* 33.9*  MCV 96.7 97.5 94.7 95.2  PLT 174 133* 210 223   Cardiac Enzymes:    Recent Labs Lab 12/19/16 1653 12/19/16 2214  TROPONINI 0.05* <0.03   BNP (last 3 results)  Recent Labs  12/19/16 0729  BNP 431.8*    ProBNP (last 3 results) No results for input(s): PROBNP in the last 8760 hours.  CBG:  Recent Labs Lab 12/24/16 1556 12/24/16 2055 12/25/16 0434 12/25/16 0721 12/25/16 1159  GLUCAP 208* 273* 190* 186* 198*    Recent Results (from the past 240 hour(s))  MRSA PCR Screening     Status: None   Collection Time: 12/20/16  7:20 AM  Result Value Ref Range Status   MRSA by PCR NEGATIVE NEGATIVE Final    Comment:        The GeneXpert MRSA Assay (FDA approved for NASAL specimens only), is one component of a comprehensive MRSA colonization surveillance program. It is not intended to diagnose MRSA infection nor to guide or monitor treatment for MRSA infections.      Studies: No results found.  Scheduled Meds: . aspirin EC  81 mg Oral q morning - 10a  . atorvastatin  10 mg Oral q1800  . azelastine  1 spray Each Nare BID  . enoxaparin (LOVENOX) injection  40 mg Subcutaneous Q24H  . fluticasone  1 spray Each Nare Daily  . insulin aspart  2-6 Units Subcutaneous TID WC  . mouth rinse  15 mL Mouth Rinse BID  . nebivolol  5 mg Oral Daily  . pantoprazole  40 mg Oral Daily  . sertraline  75 mg Oral Daily  . sodium chloride flush  3 mL Intravenous Q12H  . [START ON 12/26/2016] torsemide  40 mg Oral Daily    Continuous Infusions: . sodium chloride 250 mL (12/20/16 0800)     Time spent: 28mins  Jaquita Bessire MD, PhD  [facility] Pager [phone]. If 7PM-7AM, please contact night-coverage at [email or web address], password [facility] 12/25/2016, 2:24 PM  LOS: 5 days

## 2016-12-25 NOTE — Progress Notes (Signed)
Patient placed on BIPAP QHS without complication. RT will continue to monitor as needed.

## 2016-12-25 NOTE — Progress Notes (Signed)
Patient without complaint on 7 a to 7 p shift, VSS.  ASsisted patient up to chair with 2 max assist and walker which is largely her baseline.  Patient anticipating going home tomorrow, sitter at bedside.

## 2016-12-26 LAB — GLUCOSE, CAPILLARY
GLUCOSE-CAPILLARY: 197 mg/dL — AB (ref 65–99)
GLUCOSE-CAPILLARY: 243 mg/dL — AB (ref 65–99)
Glucose-Capillary: 191 mg/dL — ABNORMAL HIGH (ref 65–99)
Glucose-Capillary: 274 mg/dL — ABNORMAL HIGH (ref 65–99)
Glucose-Capillary: 234 mg/dL — ABNORMAL HIGH (ref 65–99)

## 2016-12-26 LAB — BASIC METABOLIC PANEL
ANION GAP: 8 (ref 5–15)
BUN: 30 mg/dL — AB (ref 6–20)
CO2: 39 mmol/L — ABNORMAL HIGH (ref 22–32)
Calcium: 9.1 mg/dL (ref 8.9–10.3)
Chloride: 96 mmol/L — ABNORMAL LOW (ref 101–111)
Creatinine, Ser: 0.96 mg/dL (ref 0.44–1.00)
GFR calc Af Amer: >60 mL/min (ref >60)
GFR, EST NON AFRICAN AMERICAN: 53 mL/min — AB (ref >60)
Glucose, Bld: 210 mg/dL — ABNORMAL HIGH (ref 65–99)
POTASSIUM: 4 mmol/L (ref 3.5–5.1)
SODIUM: 143 mmol/L (ref 135–145)

## 2016-12-26 MED ORDER — TORSEMIDE 20 MG PO TABS
60.0000 mg | ORAL_TABLET | Freq: Every day | ORAL | Status: DC
  Administered 2016-12-27 – 2016-12-29 (×3): 60 mg via ORAL
  Filled 2016-12-26 (×3): qty 3

## 2016-12-26 MED ORDER — LORAZEPAM 2 MG/ML IJ SOLN: 0.5000 mg | Freq: Four times a day (QID) | INTRAMUSCULAR | Status: DC | PRN

## 2016-12-26 MED ORDER — TORSEMIDE 20 MG PO TABS: 60.0000 mg | ORAL_TABLET | Freq: Every day | ORAL | 0 refills | Status: AC

## 2016-12-26 NOTE — Progress Notes (Signed)
Pt moved from 3E to 4E and placed on BiPAP. Pt tolerating fairly well at this time. RT to continue to monitor as needed.

## 2016-12-26 NOTE — Progress Notes (Signed)
PROGRESS NOTE  Makayla Rodriguez YTK:[id number] DOB: [birth year] DOA: 12/19/2016 PCP: Darcus Austin, MD  HPI/Recap of past 24 hours:  Planned discharge home (patient declined snf) cancelled due to patient become very drowsy and weak Personal sitter at bedside  Assessment/Plan: Active Problems:   Acute diastolic heart failure, NYHA class 2 (HCC)   Diabetes mellitus (HCC)   OA (osteoarthritis) of ankle   Hypertension   Morbid obesity ([place])   Pleural mass   Obesity hypoventilation syndrome ([place])   Cancer of upper-outer quadrant of female breast ([place])   Morbid obesity with body mass index of 45.0-49.9 in adult [facility])   Chronic respiratory failure (HCC)   Acute exacerbation of CHF (congestive heart failure) (HCC)  Acute on Chronic respiratory failure with hypoxia and hypercapnia (on home o2 3liters at night, 2liter day time) - likely due to Acute on chronic diastolic heart failure, OSA, obesity hypoventilation  -Patient was admitted to hospitalist service on 7/23 with significant edema, orthopnea and dyspnea  and weight gain, CXR small L pleural effusion , Osats 70s in RA,she was transferred to icu due to respiratory distress and intubated on 7/24 am -she is diuresed, has improved, extubated on 7/25, transferred  Back to hospitalist service on 7/27. -  she received  lasix 40mg  bid, transition to oral torsamide on 7/29, repeat bmp in am  She became drowsy on 7/30 pm, report she did not sleep well last night, will get stat abg to r/o co2 retention, will order qhs bipap, patent will benefit from snf placement but she has been refusing it. Will continue discuss with patient and family  Hypokalemia:  k replaced,  Mag 1.9   noninsulin dependent dm2,  A1c7.7 Hold home oral diabetic medications.  On meal coverage insulin in the hospital Am blood sugar 101-104  Hypertension, bp stable on lasix/bystolic  Hyperlipidemia Continue home statins   Anemia of chronic disease  Hemoglobin  stable at  10.4 .   Depression Continue home Zoloft    Morbid obesity:/OSA Body mass index is 33.05 kg/m. on cpap   FTT;  PT/OT eval  Code Status: full  Family Communication: patient  And family at bedside  Disposition Plan: home with home health vs snf, will benefit from qhs bipap   Consultants:  Patient was admitted to hospitalist service on 7/23, transferred to icu due to respiratory distress and intubated on 7/24 am  Patient is extubated and transferred back to hospitalist service on 7/27  Procedures:  Intubation on 7/24 am, extubation on 7/25 am  Antibiotics:  none   Objective: BP (!) 149/50 (BP Location: Left Arm)   Pulse 69   Temp (!) 97.3 F (36.3 C) (Oral)   Resp 15   Ht 5\' 3"  (1.6 m)   Wt 84.6 kg (186 lb 9.6 oz)   SpO2 99%   BMI 33.05 kg/m   Intake/Output Summary (Last 24 hours) at 12/26/16 1758 Last data filed at 12/26/16 0600  Gross per 24 hour  Intake           1321.5 ml  Output              700 ml  Net            621.5 ml   Filed Weights   12/24/16 0521 12/25/16 0501 12/26/16 0529  Weight: 124.6 kg (274 lb 11.2 oz) 124.2 kg (273 lb 13 oz) 84.6 kg (186 lb 9.6 oz)    Exam:   General:  Obese, NAD  Cardiovascular:  RRR  Respiratory: diminished at basis, no wheezing, no rales, no rhonchi  Abdomen: Soft/ND/NT, positive BS  Musculoskeletal: bilateral lower extremity Edema has largely resolved  Neuro: aaox3  Data Reviewed: Basic Metabolic Panel:  Recent Labs Lab 12/21/16 1100 12/22/16 0216 12/24/16 0507 12/25/16 0450 12/26/16 0636  NA 145 145 143 141 143  K 3.3* 3.5 3.2* 4.6 4.0  CL 93* 93* 95* 95* 96*  CO2 38* 38* 39* 37* 39*  GLUCOSE 101* 104* 165* 188* 210*  BUN 31* 31* 27* 35* 30*  CREATININE 1.05* 0.94 0.96 1.05* 0.96  CALCIUM 9.4 9.0 8.8* 8.8* 9.1  MG  --  1.8 1.9 1.9  --   PHOS  --  3.3  --   --   --    Liver Function Tests: No results for input(s): AST, ALT, ALKPHOS, BILITOT, PROT, ALBUMIN in the last 168  hours. No results for input(s): LIPASE, AMYLASE in the last 168 hours. No results for input(s): AMMONIA in the last 168 hours. CBC:  Recent Labs Lab 12/20/16 0747 12/21/16 1100 12/22/16 0216  WBC 12.3* 10.1 8.4  HGB 10.8* 11.0* 10.2*  HCT 34.5* 35.6* 33.9*  MCV 97.5 94.7 95.2  PLT 133* 210 223   Cardiac Enzymes:    Recent Labs Lab 12/19/16 2214  TROPONINI <0.03   BNP (last 3 results)  Recent Labs  12/19/16 0729  BNP 431.8*    ProBNP (last 3 results) No results for input(s): PROBNP in the last 8760 hours.  CBG:  Recent Labs Lab 12/26/16 0049 12/26/16 2694 12/26/16 0739 12/26/16 1219 12/26/16 1654  GLUCAP 243* 197* 191* 274* 234*    Recent Results (from the past 240 hour(s))  MRSA PCR Screening     Status: None   Collection Time: 12/20/16  7:20 AM  Result Value Ref Range Status   MRSA by PCR NEGATIVE NEGATIVE Final    Comment:        The GeneXpert MRSA Assay (FDA approved for NASAL specimens only), is one component of a comprehensive MRSA colonization surveillance program. It is not intended to diagnose MRSA infection nor to guide or monitor treatment for MRSA infections.      Studies: No results found.  Scheduled Meds: . aspirin EC  81 mg Oral q morning - 10a  . atorvastatin  10 mg Oral q1800  . azelastine  1 spray Each Nare BID  . enoxaparin (LOVENOX) injection  40 mg Subcutaneous Q24H  . fluticasone  1 spray Each Nare Daily  . insulin aspart  2-6 Units Subcutaneous TID WC  . mouth rinse  15 mL Mouth Rinse BID  . nebivolol  5 mg Oral Daily  . pantoprazole  40 mg Oral Daily  . sertraline  75 mg Oral Daily  . sodium chloride flush  3 mL Intravenous Q12H  . torsemide  40 mg Oral Daily    Continuous Infusions: . sodium chloride 250 mL (12/20/16 0800)     Time spent: 58mins  Jeffry Vogelsang MD, PhD  [facility] Pager [phone]. If 7PM-7AM, please contact night-coverage at [email or web address], password [email or web address] 12/26/2016, 5:58 PM  LOS: 6 days

## 2016-12-26 NOTE — Progress Notes (Addendum)
Called by Blanch Media RN at [phone] that patient had critical ABG and needs SDU bed. I spoke with RT and went to see that patient.   Patient was going to be discharged today and became very sleepy and hard to arouse per nursing and ABG was obtained at [facility].   Upon arrival, patient was alert and conversant and very anxious. Lung sounds were diminished overall but good air movement, on 3L SpO2 was > 92%.  Patient was hypertensive SBP in the 180s, + edema in all extremities.    Patient has not been compliant with QHS BiPAP, so I explained to the patient and her private caregiver that wearing the BiPAP will help her respiratory status. Patient stated " that the mask gives her anxiety and she is claustrophobic". I reviewed to [facility] to if there were any anti-anxiety meds PRN and there were not.  I text paged the [facility] NP and informed her that patient is hypertensive/very anxious and is not going to be able to tolerate the BIPAP now. Patient taken to 4E05  Patient did ask for APAP for generalized aches and I explained that it was too early to be given, alternative pain medication was offered and patient declined.   PRN Lorazepam was ordered if needed only if patient was in SDU.  Patient was transferred as soon as a SDU was made available. Per NP, the reason the anti-anxiety meds were not ordered earlier is because patient did not tolerate them. I was NOT told that by nursing staff and was not aware of this.  Patient did not have IV access, I attempted twice, unsuccessful, IV RN was able to gain access.  Patient was placed on BIPAP at 2130 and so far was tolerating it well. BP improved and oxygen sats are maintaining.   Patient is tenuous with regards to her compliance with the BiPAP. Will monitor  Start Time 1955 End Time 2235

## 2016-12-26 NOTE — Progress Notes (Signed)
Physical Therapy Treatment Patient Details Name: Makayla Rodriguez MRN: [id number] DOB: [birth year] Today's Date: 12/26/2016    History of Present Illness 83 year old female with PMH significant for CHF, Anxiety, CKD III, DM, GERD, OHS on home O2,and OSA on CPAP. She presented to [facility] 7/24 with SOB and peripheral edema. She was admitted to the hospitalist service for respiratory failure secondary to CHF exacerbation. worsening resp distress and transferred to ICU and intubated 7/24, extubated 7/25    PT Comments    Patient required min/mod A +2 for OOB transfer and max A for bed mobility.  Pt is very anxious with mobility. Caregiver present. Continue to progress as tolerated.   Follow Up Recommendations  Home health PT;Supervision/Assistance - 24 hour (prn +2 assist)     Equipment Recommendations  Other (comment);3in1 (PT) (they are requesting a larger transport chair; bariatric 3in1)    Recommendations for Other Services       Precautions / Restrictions Precautions Precautions: Fall Restrictions Weight Bearing Restrictions: No    Mobility  Bed Mobility Overal bed mobility: Needs Assistance Bed Mobility: Supine to Sit     Supine to sit: Max assist;+2 for physical assistance;HOB elevated     General bed mobility comments: assist to bring bilat LE to EOB, elevate trunk into sitting, and scoot hips toward EOB with use of bed pad; cues for sequencing  Transfers Overall transfer level: Needs assistance Equipment used: Rolling walker (2 wheeled) Transfers: Sit to/from [facility] Sit to Stand: Mod assist;+2 physical assistance Stand pivot transfers: Mod assist;+2 physical assistance;Min assist       General transfer comment: assist to power up into standing and to steady and mange RW when pivoting; cues for safe hand placement and technique; pt able to take very short pivotal steps (shuffling) toward BSC from EOB; BSC needed to be positioned closer to pt as  she fatigued before making full turn  Ambulation/Gait                 Stairs            Wheelchair Mobility    Modified Rankin (Stroke Patients Only)       Balance Overall balance assessment: Needs assistance Sitting-balance support: Feet supported;No upper extremity supported Sitting balance-Leahy Scale: Good     Standing balance support: Bilateral upper extremity supported;During functional activity Standing balance-Leahy Scale: Poor                              Cognition Arousal/Alertness: Awake/alert Behavior During Therapy: Anxious Overall Cognitive Status:  (anxiety vs cognition; likely baseline; reduce distractions)                                 General Comments: very anxious with mobility      Exercises      General Comments General comments (skin integrity, edema, etc.): caregiver present; 4L O2 via [place] throughout session      Pertinent Vitals/Pain Pain Assessment: Faces Faces Pain Scale: Hurts even more Pain Location: bilat LE with tactile input and mobilization Pain Descriptors / Indicators: Grimacing;Sore Pain Intervention(s): Limited activity within patient's tolerance;Monitored during session;Repositioned    Home Living                      Prior Function            PT  Goals (current goals can now be found in the care plan section) Acute Rehab PT Goals PT Goal Formulation: With patient Time For Goal Achievement: 01/07/17 Potential to Achieve Goals: Good Progress towards PT goals: Not progressing toward goals - comment    Frequency    Min 3X/week      PT Plan Current plan remains appropriate    Co-evaluation              AM-PAC PT "6 Clicks" Daily Activity  Outcome Measure  Difficulty turning over in bed (including adjusting bedclothes, sheets and blankets)?: Total Difficulty moving from lying on back to sitting on the side of the bed? : Total Difficulty sitting down on and  standing up from a chair with arms (e.g., wheelchair, bedside commode, etc,.)?: Total Help needed moving to and from a bed to chair (including a wheelchair)?: A Lot Help needed walking in hospital room?: A Lot Help needed climbing 3-5 steps with a railing? : Total 6 Click Score: 8    End of Session Equipment Utilized During Treatment: Gait belt;Oxygen Activity Tolerance: Patient tolerated treatment well Patient left: with call bell/phone within reach;with family/visitor present;Other (comment) (sitting on [facility] with caregiver present & nursing staff aware) Nurse Communication: Mobility status;Other (comment) (pt on BSC) PT Visit Diagnosis: Other abnormalities of gait and mobility (R26.89);Muscle weakness (generalized) (M62.81)     Time: 2542-7062 PT Time Calculation (min) (ACUTE ONLY): 22 min  Charges:  $Therapeutic Activity: 8-22 mins                    G Codes:       Earney Navy, PTA Pager: [phone]     Darliss Cheney 12/26/2016, 10:18 AM

## 2016-12-26 NOTE — Progress Notes (Signed)
Patient stable 7p-7a, caregiver at bedside.

## 2016-12-26 NOTE — Progress Notes (Addendum)
Results for CATHYRN, DEAS (MRN [id number]) as of 12/26/2016 11:54  Ref. Range 12/25/2016 16:51 12/25/2016 21:17 12/26/2016 00:49 12/26/2016 06:33 12/26/2016 07:39  Glucose-Capillary Latest Ref Range: 65 - 99 mg/dL 207 (H) 314 (H) 243 (H) 197 (H) 191 (H)  Recommend changing Novolog to SENSITIVE correction scale TID & HS since patient is eating.  Patient is currently on ICU hyperglycemia protocol Novolog 2-6 units.  Harvel Ricks RN BSN CDE Diabetes Coordinator Pager: [phone]  8am-5pm

## 2016-12-26 NOTE — Progress Notes (Signed)
Paged MD awaiting call back.

## 2016-12-26 NOTE — Progress Notes (Signed)
Critical values of ABG reported to RN at this time

## 2016-12-26 NOTE — Progress Notes (Addendum)
CM talked to patient's sister Janora Norlander for [facility] choices, Watt Climes chose [facility]; [place] with [facility] called for arrangements; Also [place] called for portable oxygen tank for home. Mindi Slicker [facility] [phone]  4:09 pm- Ambulance transportation called for transportation home; home address verified; [address] [phone]

## 2016-12-26 NOTE — Discharge Summary (Signed)
Discharge Summary  Makayla Rodriguez JGG:[id number] DOB: 1932/04/16  PCP: Darcus Austin, MD  Admit date: 12/19/2016 Discharge date: 12/26/2016  Time spent: >48mins, more than 50% time spent on coordination of care  Recommendations for Outpatient Follow-up:  1. F/u with PMD within a week  for hospital discharge follow up, repeat cbc/bmp at follow up 2. F/u with cardiology for diastolic chf and moderate aortic stenosis  Discharge Diagnoses:  Active Hospital Problems   Diagnosis Date Noted  . Acute exacerbation of CHF (congestive heart failure) ([place]) 12/19/2016  . Chronic respiratory failure ([place]) 12/31/2014  . Morbid obesity with body mass index of 45.0-49.9 in adult [facility]) 09/20/2014  . Cancer of upper-outer quadrant of female breast ([place]) 03/07/2013  . Obesity hypoventilation syndrome ([place]) 08/09/2012  . Pleural mass 05/15/2012  . Acute diastolic heart failure, NYHA class 2 ([place]) 05/11/2012  . Diabetes mellitus ([place]) 05/11/2012  . OA (osteoarthritis) of ankle 05/11/2012  . Hypertension 05/11/2012  . Morbid obesity ([place]) 05/11/2012    Resolved Hospital Problems   Diagnosis Date Noted Date Resolved  No resolved problems to display.    Discharge Condition: stable  Diet recommendation: heart healthy/carb modified  Filed Weights   12/24/16 0521 12/25/16 0501 12/26/16 0529  Weight: 124.6 kg (274 lb 11.2 oz) 124.2 kg (273 lb 13 oz) 84.6 kg (186 lb 9.6 oz)    History of present illness:  PCP: Darcus Austin, MD   Patient coming from:  Home    Chief Complaint: Shortness of breath   HPI: Makayla Rodriguez is a 83 y.o. female with medical history significant for HTN, HLD,DM, OSA, pleural mass followed by pulmonary as OP, remote breast cancer, Chronic respiratory failure on 2 L O2 and on 3 L at night, presenting to the ED with increasing shortness of breath since las evening. She reports increased peripheral edema after having food indiscretion last Saturday night, including Salami. She  has not been very compliant with meds over the last week. She has not been seen by a cardiologist since 2015  Denies productive  sputum. Denies rhinorrhea or hemoptysis. Denies fevers, chills, night sweats or mucositis. Denies any chest pain, chest wall pain or palpitations. Denies any abdominal pain. Has decreased appetite due to current symptoms.Denies nausea or vomiting. Denies dizziness or vertigo. No confusion was reported. Denies any vision changes, double vision or headaches.    ED Course:  BP (!) 158/98   Pulse 73   Temp 98.6 F (37 C) (Oral)   Resp (!) 28   Ht 5\' 3"  (1.6 m)   Wt (S) 136.1 kg (300 lb) Comment: reports she hasn't been able to stand for a weight  SpO2 97%   BMI 53.14 kg/m    sodium 141 potassium 4.4 creatinine 0.92 BNP 431.8 troponin 0.02 white count 8.6 hemoglobin 10.4 platelets 174 glucose 167 chest x-ray shows mild pleural effusion on the left EKG sinus rhythm, no significant changes from prior.   Echo 2015: The cavity size was normal. Wall thickness wasincreased in a pattern of moderate LVH. Systolic function wasnormal.EF 50%to 55%.     Hospital Course:  Active Problems:   Acute diastolic heart failure, NYHA class 2 (HCC)   Diabetes mellitus (HCC)   OA (osteoarthritis) of ankle   Hypertension   Morbid obesity (HCC)   Pleural mass   Obesity hypoventilation syndrome (HCC)   Cancer of upper-outer quadrant of female breast ([place])   Morbid obesity with body mass index of 45.0-49.9 in adult [facility])   Chronic respiratory  failure (HCC)   Acute exacerbation of CHF (congestive heart failure) (HCC)   Acute on Chronicrespiratory failure with hypoxia and hypercapnia (on home o2 3liters at night, 2liter day time) - likely due to Acute on chronic diastolicheart failure, OSA, obesity hypoventilation  -Patient was admitted to hospitalist service on 7/23 with significant edema, orthopnea and dyspnea  and weight gain, CXR small L pleural effusion ,Osats 70s in  RA,she was transferred to icu due to respiratory distress and intubated on 7/24 am -ehoc on 12/19/2016 "The estimated ejection fraction was in the  range of 65% to 70%. Wall motion was normal; there were no   regional wall motion abnormalities. The study is not technically   sufficient to allow evaluation of LV diastolic function." -she is diuresed, has improved, extubated on 7/25, transferred  Back to hospitalist service on 7/27. - she received  lasix 40mg  bid, transition to oral torsamide daily on 7/29, repeat bmp cr remain stable, she is discharged home on torsamide 60mg  daily, she is to follow with pmd in one week to repeat bmp, she is to follow with cardiology in three weeks.  Moderate aortic stenosis, she denies chest pain , no syncope, she is to follow up with cardiology in three weeks.  Hypokalemia:  k replaced,  Mag 1.9   noninsulin dependent dm2,  A1c7.7 She received ssi in the hospital, am blood sugar 101-104 she does not want to use insulin at home, home glipizide held on admission, resumed at discharge.  She reports metformin "mess up her kidney" She is to follow with pmd for blood sugar control. She report her insurance dose not cover sitagliptin  Hypertension, bp stable on bystolic/torsamide   Hyperlipidemia Continue home statins   Anemia of chronic disease Hemoglobin stable at  10.4.   Depression Continue home Zoloft    Morbid obesity:/OSA Body mass index is 48.5 kg/m. on cpap   FTT;  PT/OT eval  Code Status: full  Family Communication: patient  And family at bedside, sister over the phone  Disposition Plan: home with home health on 7/30   Consultants:  Patient was admitted to hospitalist service on 7/23, transferred to icu due to respiratory distress and intubated on 7/24 am  Patient is extubated and transferred back to hospitalist service on 7/27  Procedures:  Intubation on 7/24 am, extubation on 7/25  am  Antibiotics:  none   Discharge Exam: BP (!) 149/77 (BP Location: Left Arm)   Pulse 73   Temp 97.7 F (36.5 C) (Oral)   Resp 20   Ht 5\' 3"  (1.6 m)   Wt 84.6 kg (186 lb 9.6 oz)   SpO2 100%   BMI 33.05 kg/m    General:  Obese, NAD  Cardiovascular: RRR  Respiratory: diminished at basis, no wheezing, no rales, no rhonchi  Abdomen: Soft/ND/NT, positive BS  Musculoskeletal: bilateral lower extremity Edema has largely resolved  Neuro: aaox3   Discharge Instructions You were cared for by a hospitalist during your hospital stay. If you have any questions about your discharge medications or the care you received while you were in the hospital after you are discharged, you can call the unit and asked to speak with the hospitalist on call if the hospitalist that took care of you is not available. Once you are discharged, your primary care physician will handle any further medical issues. Please note that NO REFILLS for any discharge medications will be authorized once you are discharged, as it is imperative that you return to  your primary care physician (or establish a relationship with a primary care physician if you do not have one) for your aftercare needs so that they can reassess your need for medications and monitor your lab values.  Discharge Instructions    Diet - low sodium heart healthy    Complete by:  As directed    Carb modified   Discharge instructions    Complete by:  As directed    Please call your heart doctor if your legs start to retain fluids   Face-to-face encounter (required for Medicare/Medicaid patients)    Complete by:  As directed    I Nayel Purdy certify that this patient is under my care and that I, or a nurse practitioner or physician's assistant working with me, had a face-to-face encounter that meets the physician face-to-face encounter requirements with this patient on 12/24/2016. The encounter with the patient was in whole, or in part for the  following medical condition(s) which is the primary reason for home health care (List medical condition): FTT   The encounter with the patient was in whole, or in part, for the following medical condition, which is the primary reason for home health care:  FTT   I certify that, based on my findings, the following services are medically necessary home health services:   Nursing Physical therapy     Reason for Medically Necessary Home Health Services:  Skilled Nursing- Change/Decline in Patient Status   My clinical findings support the need for the above services:  Shortness of breath with activity   Further, I certify that my clinical findings support that this patient is homebound due to:  Unable to leave home safely without assistance   Home Health    Complete by:  As directed    To provide the following care/treatments:   PT OT RN     Increase activity slowly    Complete by:  As directed      Allergies as of 12/26/2016      Reactions   Demerol [meperidine] Anaphylaxis   Reaction: Hypoventilation after surgery   Penicillins Anaphylaxis, Swelling   Has patient had a PCN reaction causing immediate rash, facial/tongue/throat swelling, SOB or lightheadedness with hypotension: Yes Has patient had a PCN reaction causing severe rash involving mucus membranes or skin necrosis: No Has patient had a PCN reaction that required hospitalization: Yes Has patient had a PCN reaction occurring within the last 10 years: No If all of the above answers are "NO", then may proceed with Cephalosporin use.   Phenergan [promethazine Hcl] Anaphylaxis   Reaction: hypoventilation   Codeine Nausea And Vomiting   Macrobid [nitrofurantoin] Nausea And Vomiting   Relafen [nabumetone] Itching, Swelling      Medication List    TAKE these medications   acetaminophen 500 MG tablet Commonly known as:  TYLENOL Take 500 mg by mouth every 6 (six) hours as needed for mild pain.   albuterol 108 (90 Base) MCG/ACT  inhaler Commonly known as:  PROAIR HFA Inhale 2 puffs into the lungs every 6 (six) hours as needed for wheezing or shortness of breath.   aspirin EC 81 MG tablet Take 81 mg by mouth every morning.   atorvastatin 10 MG tablet Commonly known as:  LIPITOR Take 10 mg by mouth daily. Reported on 05/14/2015   azelastine 0.1 % nasal spray Commonly known as:  ASTELIN Place 2 sprays into both nostrils 2 (two) times daily. Use in each nostril as directed   cetirizine 10 MG  tablet Commonly known as:  ZYRTEC Take 10 mg by mouth daily. Reported on 05/14/2015   cholecalciferol 1000 units tablet Commonly known as:  VITAMIN D Take 1,000 Units by mouth every morning. Reported on 05/14/2015   Chromium-Cinnamon [phone] MCG-MG Caps Take 1 tablet by mouth at bedtime.   diclofenac sodium 1 % Gel Commonly known as:  VOLTAREN Apply 4 g topically 2 (two) times daily as needed (for joint pain). For joint pain   docusate sodium 100 MG capsule Commonly known as:  COLACE Take 100-200 mg by mouth at bedtime as needed for constipation.   fluticasone 50 MCG/ACT nasal spray Commonly known as:  FLONASE Place 2 sprays into the nose 2 (two) times daily.   glipiZIDE 10 MG 24 hr tablet Commonly known as:  GLUCOTROL XL Take 10 mg by mouth daily with breakfast.   hydroxypropyl methylcellulose / hypromellose 2.5 % ophthalmic solution Commonly known as:  ISOPTO TEARS / GONIOVISC Place 1 drop into both eyes 3 (three) times daily as needed (for dry eyes).   nebivolol 5 MG tablet Commonly known as:  BYSTOLIC Take 5 mg by mouth daily.   OMEGA 3 PO Take 1 capsule by mouth every morning.   pantoprazole 40 MG tablet Commonly known as:  PROTONIX Take 40 mg by mouth every morning.   polyethylene glycol packet Commonly known as:  MIRALAX / GLYCOLAX Take 17 g by mouth daily as needed (for constipation).   sertraline 50 MG tablet Commonly known as:  ZOLOFT Take 75 mg by mouth daily.   sodium chloride  0.65 % Soln nasal spray Commonly known as:  OCEAN Place 1 spray into both nostrils as needed for congestion.   torsemide 20 MG tablet Commonly known as:  DEMADEX Take 3 tablets (60 mg total) by mouth daily. What changed:  how much to take   TOZAL PO Take 3 tablets by mouth daily. For eyes   traMADol 50 MG tablet Commonly known as:  ULTRAM Take 50 mg by mouth 2 (two) times daily.   TUSSIN DM PO Take 5 mLs by mouth as needed (for cough).      Allergies  Allergen Reactions  . Demerol [Meperidine] Anaphylaxis    Reaction: Hypoventilation after surgery  . Penicillins Anaphylaxis and Swelling    Has patient had a PCN reaction causing immediate rash, facial/tongue/throat swelling, SOB or lightheadedness with hypotension: Yes Has patient had a PCN reaction causing severe rash involving mucus membranes or skin necrosis: No Has patient had a PCN reaction that required hospitalization: Yes Has patient had a PCN reaction occurring within the last 10 years: No If all of the above answers are "NO", then may proceed with Cephalosporin use.   Marland Kitchen Phenergan [Promethazine Hcl] Anaphylaxis    Reaction: hypoventilation  . Codeine Nausea And Vomiting  . Macrobid [Nitrofurantoin] Nausea And Vomiting  . Relafen [Nabumetone] Itching and Swelling   Follow-up Information    Darcus Austin, MD. Go on 01/02/2017.   Specialty:  Family Medicine Why:  hospital discharge follow up, repeat cbc/bmp at follow up.@3 :00PM Contact information: [place] [address] [phone]        [place] [facility] Follow up in 3 week(s).   Specialty:  Cardiology Why:  for diastolic chf  and aortic stenosis Contact information: [address] [place] [phone]           The results of significant diagnostics from this hospitalization (including imaging,  microbiology, ancillary and laboratory) are listed below for  reference.    Significant Diagnostic Studies: Dg Chest 2 View  Result Date: 12/19/2016 CLINICAL DATA:  Shortness of breath. EXAM: CHEST  2 VIEW COMPARISON:  Radiographs of Oct 10, 2016. FINDINGS: Stable cardiomediastinal silhouette. Atherosclerosis of thoracic aorta is noted. No pneumothorax is noted. Mild diffuse interstitial densities are noted throughout both lungs concerning for chronic scarring, although superimposed edema or inflammation cannot be excluded. Increased left basilar atelectasis or infiltrate is noted with mild left pleural effusion. Multilevel degenerative disc disease of thoracic spine is noted. IMPRESSION: Aortic atherosclerosis. Stable mild diffuse interstitial densities are noted throughout both lungs concerning for chronic scarring or inflammation, although superimposed acute edema or inflammation is noted. Increased left basilar atelectasis or infiltrate is noted with mild left pleural effusion. Electronically Signed   By: Marijo Conception, M.D.   On: 12/19/2016 08:00   Dg Chest Port 1 View  Result Date: 12/21/2016 CLINICAL DATA:  Respiratory failure. EXAM: PORTABLE CHEST 1 VIEW COMPARISON:  12/20/2016 . FINDINGS: Endotracheal tube approximately 1.3 cm above the carina. Proximal repositioning of approximately 2 cm should be considered. NG tube in stable position. Stable cardiomegaly. Persistent bibasilar atelectasis and infiltrates. Small left pleural effusion again noted. No pneumothorax . IMPRESSION: 1. Endotracheal tube 1.3 cm above the carina. Proximal repositioning of approximately 2 cm should be considered. NG tube in stable position. 2. Persistent unchanged bibasilar atelectasis and infiltrates. Persistent small left pleural effusion. 3. Stable cardiomegaly . Electronically Signed   By: Marcello Moores  Register   On: 12/21/2016 06:46   Dg Chest Port 1 View  Result Date: 12/20/2016 CLINICAL DATA:  Intubation EXAM: PORTABLE CHEST 1 VIEW COMPARISON:  12/20/2016 FINDINGS:  Endotracheal tube tip is 1.7 cm above the carina. Nasogastric tube extends below the diaphragm and beyond the inferior edge of the image. No change in diffuse interstitial opacities. No pneumothorax. IMPRESSION: Satisfactorily positioned ETT. Electronically Signed   By: Andreas Newport M.D.   On: 12/20/2016 04:58   Dg Chest Port 1 View  Result Date: 12/20/2016 CLINICAL DATA:  Shortness of breath. EXAM: PORTABLE CHEST 1 VIEW COMPARISON:  Frontal and lateral views yesterday. Chest CT 01/15/2015 FINDINGS: Low lung volumes. Cardiomegaly with atherosclerosis and tortuosity of the thoracic aorta. Diffuse interstitial opacities may be bronchial inflammation or pulmonary edema. Blunting of left costophrenic angle, likely in part secondary to pleural based lesion is seen on prior CT. Possible left pleural effusion. No pneumothorax. The exam is otherwise unchanged. IMPRESSION: 1. Diffuse interstitial opacities may be bronchial thickening or pulmonary edema, unchanged from prior exam. 2. Left lung base opacity likely partially related to pleural-based lesion as seen on prior chest CT. Possible small pleural effusion. 3. Cardiomegaly with aortic atherosclerosis Electronically Signed   By: Jeb Levering M.D.   On: 12/20/2016 03:44   Dg Abd Portable 1v  Result Date: 12/20/2016 CLINICAL DATA:  Assess orogastric tube positioning. EXAM: PORTABLE ABDOMEN - 1 VIEW COMPARISON:  Portable chest x-ray of December 20, 2016 FINDINGS: The orogastric tube tip and proximal port lie in the gastric body. The bowel gas pattern where visualized does not suggest obstruction. A moderate colonic stool burden is observed. There are coarse lung markings at both lung bases. There are degenerative disc changes at multiple thoracic and lumbar levels. IMPRESSION: The esophagogastric tube tip and proximal port lie in the gastric body. Electronically Signed   By: David  Martinique M.D.   On: 12/20/2016 07:30   Dg Abd Portable 1v  Result Date:  12/20/2016 CLINICAL DATA:  Orogastric tube placement EXAM: PORTABLE ABDOMEN - 1 VIEW COMPARISON:  None. FINDINGS: The orogastric tube tip just reaches the stomach. The proximal port is at the EG junction or in the distal esophagus. The tube should be advanced for optimal placement. Visible portions of the abdominal gas pattern are unremarkable IMPRESSION: The orogastric tube just reaches the stomach and should be advanced 5-10 cm for optimal placement. These results will be called to the ordering clinician or representative by the Radiologist Assistant, and communication documented in the PACS or zVision Dashboard. Electronically Signed   By: Andreas Newport M.D.   On: 12/20/2016 04:59    Microbiology: Recent Results (from the past 240 hour(s))  MRSA PCR Screening     Status: None   Collection Time: 12/20/16  7:20 AM  Result Value Ref Range Status   MRSA by PCR NEGATIVE NEGATIVE Final    Comment:        The GeneXpert MRSA Assay (FDA approved for NASAL specimens only), is one component of a comprehensive MRSA colonization surveillance program. It is not intended to diagnose MRSA infection nor to guide or monitor treatment for MRSA infections.      Labs: Basic Metabolic Panel:  Recent Labs Lab 12/21/16 1100 12/22/16 0216 12/24/16 0507 12/25/16 0450 12/26/16 0636  NA 145 145 143 141 143  K 3.3* 3.5 3.2* 4.6 4.0  CL 93* 93* 95* 95* 96*  CO2 38* 38* 39* 37* 39*  GLUCOSE 101* 104* 165* 188* 210*  BUN 31* 31* 27* 35* 30*  CREATININE 1.05* 0.94 0.96 1.05* 0.96  CALCIUM 9.4 9.0 8.8* 8.8* 9.1  MG  --  1.8 1.9 1.9  --   PHOS  --  3.3  --   --   --    Liver Function Tests: No results for input(s): AST, ALT, ALKPHOS, BILITOT, PROT, ALBUMIN in the last 168 hours. No results for input(s): LIPASE, AMYLASE in the last 168 hours. No results for input(s): AMMONIA in the last 168 hours. CBC:  Recent Labs Lab 12/20/16 0747 12/21/16 1100 12/22/16 0216  WBC 12.3* 10.1 8.4  HGB 10.8*  11.0* 10.2*  HCT 34.5* 35.6* 33.9*  MCV 97.5 94.7 95.2  PLT 133* 210 223   Cardiac Enzymes:  Recent Labs Lab 12/19/16 1653 12/19/16 2214  TROPONINI 0.05* <0.03   BNP: BNP (last 3 results)  Recent Labs  12/19/16 0729  BNP 431.8*    ProBNP (last 3 results) No results for input(s): PROBNP in the last 8760 hours.  CBG:  Recent Labs Lab 12/25/16 2117 12/26/16 0049 12/26/16 0633 12/26/16 0739 12/26/16 1219  GLUCAP 314* 243* 197* 191* 274*       SignedFlorencia Reasons MD, PhD  [facility] 12/26/2016, 12:29 PM

## 2016-12-27 DIAGNOSIS — Z7189 Other specified counseling: Secondary | ICD-10-CM

## 2016-12-27 DIAGNOSIS — Z515 Encounter for palliative care: Secondary | ICD-10-CM

## 2016-12-27 LAB — BLOOD GAS, ARTERIAL
Acid-Base Excess: 13.3 mmol/L — ABNORMAL HIGH (ref 0.0–2.0)
Bicarbonate: 41.2 mmol/L — ABNORMAL HIGH (ref 20.0–28.0)
DRAWN BY: 418751
O2 CONTENT: 3 L/min
O2 SAT: 92.8 %
PCO2 ART: 105 mmHg — AB (ref 32.0–48.0)
Patient temperature: 98.6
pH, Arterial: 7.217 — ABNORMAL LOW (ref 7.350–7.450)
pO2, Arterial: 74.3 mmHg — ABNORMAL LOW (ref 83.0–108.0)

## 2016-12-27 LAB — BASIC METABOLIC PANEL
ANION GAP: 10 (ref 5–15)
BUN: 30 mg/dL — AB (ref 6–20)
CALCIUM: 9.4 mg/dL (ref 8.9–10.3)
CO2: 37 mmol/L — ABNORMAL HIGH (ref 22–32)
Chloride: 95 mmol/L — ABNORMAL LOW (ref 101–111)
Creatinine, Ser: 0.97 mg/dL (ref 0.44–1.00)
GFR calc Af Amer: >60 mL/min (ref >60)
GFR, EST NON AFRICAN AMERICAN: 53 mL/min — AB (ref >60)
GLUCOSE: 161 mg/dL — AB (ref 65–99)
Potassium: 4.2 mmol/L (ref 3.5–5.1)
Sodium: 142 mmol/L (ref 135–145)

## 2016-12-27 LAB — GLUCOSE, CAPILLARY
GLUCOSE-CAPILLARY: 158 mg/dL — AB (ref 65–99)
GLUCOSE-CAPILLARY: 164 mg/dL — AB (ref 65–99)
GLUCOSE-CAPILLARY: 172 mg/dL — AB (ref 65–99)
GLUCOSE-CAPILLARY: 184 mg/dL — AB (ref 65–99)
GLUCOSE-CAPILLARY: 213 mg/dL — AB (ref 65–99)
Glucose-Capillary: 175 mg/dL — ABNORMAL HIGH (ref 65–99)

## 2016-12-27 NOTE — Progress Notes (Signed)
CRITICAL VALUE ALERT  Critical Value:  ABG- Ph- 7.21                                   CO2 105                                    O2 74.3                                     HCO3 41.2  Date & Time Notied: 12/26/16  1930 Provider Notified:Kirby,NP 12/26/16 1947 Orders Received/Actions taken: yes

## 2016-12-27 NOTE — Progress Notes (Signed)
Report called to [place] on 4E;  2115 pt. transported to 4E-05 via bed w/o any problems, and Puja,RN RRT assisted with transport.

## 2016-12-27 NOTE — Care Management Note (Addendum)
Case Management Note Original Note Created Sherrilyn Rist [phone] 12/23/2016, 12:17 PM   Patient Details  Name: Makayla Rodriguez MRN: [id number] Date of Birth: [birth year]  Subjective/Objective:    CHF               Action/Plan: CM talked to patient at the bedside; PCP: Darcus Austin, MD; has private insurance with Medicare/ AARP with prescription drug coverage; DCP - return home with continuation of care with 24 hr caregivers; DME- home oxygen, CPAP, she states that she does not want a hospital bed at this time; CM informed patient that when she decides that she wants a hospital bed to call her PCP and the can make the referral to the DME company; No needs identified at this time. CM will continue to follow for DCP.  Expected Discharge Date:               Expected Discharge Plan:  [place]  In-House Referral:   [facility]    Discharge planning Services  CM Consult  Post Acute Care Choice:  Home Health, Durable Medical Equipment Choice offered to:  Patient, Sibling  DME Arranged:  Bipap DME Agency:     HH Arranged:  RN, PT, OT, Social Work, Education administrator [place] Agency:  [facility] [employer]  Status of Service:  In process, will continue to follow  If discussed at Long Length of Stay Meetings, dates discussed:    Discharge Disposition: home/home health   Additional Comments:  12/28/16- 1000- Marvetta Gibbons RN, CM- spoke with Santiago Glad at [facility] regarding NIV- pt was not approved for NIV and will need to seek approval for BIPAP- have spoken with Dr. Wendee Beavers and orders placed for BIPAP- have faxed order to [facility] along with paperwork- and call made to f/u-   12/27/16- 1500- Hugo Lybrand RN, CM- pt was for d/c yesterday to home- with [facility] services- had resp. Decline prior to discharge and needed bipap- spoke with Stanton Kidney at [place] at [facility]- following for [facility] needs confirmed choice with pt and pt's sister. Per MD pt will need  bipap for home- per sister has cpap at home along with home 02- this is through [facility]- call made to DME company to ask about Bipap- pt would need to have approval and per [place] [facility] this process can take up to 5 days- also spoke with karen with [facility]- who also states Bipap approval process may take up to 5 days. Pt and sister want to use [facility] for other DME needs- and willing to have them look into Bipap vs NIV option- MD has filled out paperwork for NIV- which if approved may take less than 5 days. Orders for hospital bed and hoyer lift placed for home. Pt will need non emergent transport when ready for discharge. CM will continue to follow. PC also met with pt - CM will follow after 2nd meeting for possible community PC follow up- pt would prefer to have sister here for conversation. Call made to sister and updated her on DME status- checked with her on PC mtg-  Per sister- meeting with PC is set for 12/28/16 between 12:00 and 1pm-- CM will f/u with pt and sister following meeting.   Dawayne Patricia, RN 12/27/2016, 3:24 PM

## 2016-12-27 NOTE — Progress Notes (Addendum)
PROGRESS NOTE  Makayla Rodriguez MEQ:[id number] DOB: 1931/10/30 DOA: 12/19/2016 PCP: Darcus Austin, MD  Brief Summary:  She is admitted with Hypoxia/hypercapnea respiratory failure required intubation, now extubated. Diastolic chf /oas/ obesity hypoventilation/ poor respiratory reserve. Likely d/c home on 8/1 if home bipap or home vent (trilogy) gets approved, palliative care to meet with patient again at noon on 8/1 prior to discharge.   HPI/Recap of past 24 hours:  Planned discharge home (patient declined snf) cancelled on 7/30 due to patient became very drowsy and retained co2 again,  She improved on bipap,  currently awaiting on bipap or home vent (trilogy) approval to discharge home   Personal sitters and sister at bedside  Assessment/Plan: Active Problems:   Acute diastolic heart failure, NYHA class 2 (HCC)   Diabetes mellitus (HCC)   OA (osteoarthritis) of ankle   Hypertension   Morbid obesity ([place])   Pleural mass   Obesity hypoventilation syndrome (HCC)   Cancer of upper-outer quadrant of female breast ([place])   Morbid obesity with body mass index of 45.0-49.9 in adult [facility])   Chronic respiratory failure (HCC)   Acute exacerbation of CHF (congestive heart failure) ([place])  Acute on Chronic respiratory failure with hypoxia and hypercapnia (at home on o2 3liters at night with cpap, 2liter day time prior to this hospitalization) - likely due to Acute on chronic diastolic heart failure, OSA, obesity hypoventilation  -Patient was admitted to hospitalist service on 7/23 with significant edema, orthopnea and dyspnea  and weight gain, CXR small L pleural effusion , Osats 70s in RA, she failed cpap, she did not improved on bipap. She was transferred to icu and intubated on 7/24 am -she is diuresed, has improved, extubated on 7/25, transferred  Back to hospitalist service on 7/27. -  she received  lasix 40mg  bid, transition to oral torsamide on 7/29 -She became very drowsy on 7/30 pm,  stat abg again showed co2 retentionat 105, respiratory acidosis PH7.2, she was transferred back to stepdown and put put bipap, she improved on bipap. - I have discussed with case with pulmonary critical care, per pulmonary critical care, qhs bipap is crucial for her survival, consider palliative care referral if she is not able to get bipap or not able to tolerate bipap/home vent -palliative care consulted, input appreciated -case manager working on getting approval for home bipap/home vent, can discharge home if equipment able to deliver to her on 8/1.  Hypokalemia:  k replaced,  Mag 1.9   noninsulin dependent dm2,  A1c7.7 Hold home oral diabetic medications.  On meal coverage insulin in the hospital Am blood sugar 161  Hypertension, bp stable on bystolic/torsamide   Hyperlipidemia Continue home statins   Anemia of chronic disease  Hemoglobin stable at  10.4 .   Depression Continue home Zoloft    Morbid obesity:/OSA/obesity hypoventilation syndrome/chronic c02 retention Body mass index is 33.05 kg/m. she failed cpap, need bipap or home vent   FTT;  PT/OT eval  Code Status: full  Family Communication: patient  And family at bedside  Disposition Plan: home with home health (patient declined snf),  qhs bipap/home vent Continue follow with community palliative care   Consultants:  Patient was admitted to hospitalist service on 7/23, transferred to icu due to respiratory distress and intubated on 7/24 am  Patient is extubated and transferred back to hospitalist service on 7/27  Procedures:  Intubation on 7/24 am, extubation on 7/25 am  bipap  Antibiotics:  none   Objective: BP 112/60  Pulse 91   Temp 98.9 F (37.2 C) (Axillary)   Resp 15   Ht 5\' 3"  (1.6 m)   Wt 84.6 kg (186 lb 9.6 oz)   SpO2 98%   BMI 33.05 kg/m   Intake/Output Summary (Last 24 hours) at 12/27/16 1230 Last data filed at 12/27/16 0431  Gross per 24 hour  Intake                 0 ml  Output              500 ml  Net             -500 ml   Filed Weights   12/24/16 0521 12/25/16 0501 12/26/16 0529  Weight: 124.6 kg (274 lb 11.2 oz) 124.2 kg (273 lb 13 oz) 84.6 kg (186 lb 9.6 oz)    Exam:   General:  Obese, NAD, drowsiness has resolved  Cardiovascular: RRR  Respiratory: diminished at basis, no wheezing, no rales, no rhonchi  Abdomen: Soft/ND/NT, positive BS  Musculoskeletal: bilateral lower extremity Edema has largely resolved  Neuro: aaox3  Data Reviewed: Basic Metabolic Panel:  Recent Labs Lab 12/22/16 0216 12/24/16 0507 12/25/16 0450 12/26/16 0636 12/27/16 0233  NA 145 143 141 143 142  K 3.5 3.2* 4.6 4.0 4.2  CL 93* 95* 95* 96* 95*  CO2 38* 39* 37* 39* 37*  GLUCOSE 104* 165* 188* 210* 161*  BUN 31* 27* 35* 30* 30*  CREATININE 0.94 0.96 1.05* 0.96 0.97  CALCIUM 9.0 8.8* 8.8* 9.1 9.4  MG 1.8 1.9 1.9  --   --   PHOS 3.3  --   --   --   --    Liver Function Tests: No results for input(s): AST, ALT, ALKPHOS, BILITOT, PROT, ALBUMIN in the last 168 hours. No results for input(s): LIPASE, AMYLASE in the last 168 hours. No results for input(s): AMMONIA in the last 168 hours. CBC:  Recent Labs Lab 12/21/16 1100 12/22/16 0216  WBC 10.1 8.4  HGB 11.0* 10.2*  HCT 35.6* 33.9*  MCV 94.7 95.2  PLT 210 223   Cardiac Enzymes:   No results for input(s): CKTOTAL, CKMB, CKMBINDEX, TROPONINI in the last 168 hours. BNP (last 3 results)  Recent Labs  12/19/16 0729  BNP 431.8*    ProBNP (last 3 results) No results for input(s): PROBNP in the last 8760 hours.  CBG:  Recent Labs Lab 12/26/16 1219 12/26/16 1654 12/27/16 0046 12/27/16 0428 12/27/16 0833  GLUCAP 274* 234* 172* 164* 184*    Recent Results (from the past 240 hour(s))  MRSA PCR Screening     Status: None   Collection Time: 12/20/16  7:20 AM  Result Value Ref Range Status   MRSA by PCR NEGATIVE NEGATIVE Final    Comment:        The GeneXpert MRSA Assay  (FDA approved for NASAL specimens only), is one component of a comprehensive MRSA colonization surveillance program. It is not intended to diagnose MRSA infection nor to guide or monitor treatment for MRSA infections.      Studies: No results found.  Scheduled Meds: . aspirin EC  81 mg Oral q morning - 10a  . atorvastatin  10 mg Oral q1800  . azelastine  1 spray Each Nare BID  . enoxaparin (LOVENOX) injection  40 mg Subcutaneous Q24H  . fluticasone  1 spray Each Nare Daily  . insulin aspart  2-6 Units Subcutaneous TID WC  . mouth rinse  15 mL  Mouth Rinse BID  . nebivolol  5 mg Oral Daily  . pantoprazole  40 mg Oral Daily  . sertraline  75 mg Oral Daily  . sodium chloride flush  3 mL Intravenous Q12H  . torsemide  60 mg Oral Daily    Continuous Infusions: . sodium chloride 250 mL (12/20/16 0800)     Time spent: 25mins, more than 50% time spent on coordination of care  Negar Sieler MD, PhD  [facility] Pager [phone]. If 7PM-7AM, please contact night-coverage at [email or web address], password [facility] 12/27/2016, 12:30 PM  LOS: 7 days

## 2016-12-27 NOTE — Progress Notes (Signed)
Physical Therapy Treatment Patient Details Name: Makayla Rodriguez MRN: [id number] DOB: 02-18-1932 Today's Date: 12/27/2016    History of Present Illness 83 year old female with PMH significant for CHF, Anxiety, CKD III, DM, GERD, OHS on home O2,and OSA on CPAP. She presented to [facility] 7/24 with SOB and peripheral edema. She was admitted to the hospitalist service for respiratory failure secondary to CHF exacerbation. worsening resp distress and transferred to ICU and intubated 7/24, extubated 7/25    PT Comments    Pt unable to progress her mobility today secondary to her fear of falling with pivoting over to chair. Pt is maxAx2 for bed mobility and modAx2 for initial sit>stand refusing to pivot over to chair, second attempt required maxAx2 and pt was unable to clear hips from bed surface. Pt transferred back into bed as appropriate lift device was not present on floor to transfer her over to her chair. Pt educated on need for her to continue to progress her mobility to be able to get out of the bed once she gets home. Pt requires skilled PT to progress mobility and to improve LE strength and endurance to safely mobilize in her home environment.    Follow Up Recommendations  Home health PT;Supervision/Assistance - 24 hour (prn +2 assist)     Equipment Recommendations  Other (comment);3in1 (PT) (they are requesting a larger transport chair; bariatric 3in1)       Precautions / Restrictions Precautions Precautions: Fall Restrictions Weight Bearing Restrictions: No    Mobility  Bed Mobility Overal bed mobility: Needs Assistance Bed Mobility: Supine to Sit;Sit to Supine     Supine to sit: Max assist;+2 for physical assistance;HOB elevated Sit to supine: Max assist;+2 for physical assistance   General bed mobility comments: assist to bring bilat LE to EOB, elevate trunk into sitting, and scoot hips toward EOB with use of bed pad; cues for sequencing  Transfers Overall transfer  level: Needs assistance Equipment used: Rolling walker (2 wheeled) Transfers: Sit to/from [facility] Sit to Stand: Mod assist;+2 physical assistance;Max assist         General transfer comment: modA for powerup to standing from EoB, pt limited by fear of falling from incident yesterday and unable to take steps or pivot toward chair with maximal vc, pt sat down rested and attempted again this time she was not able to clear bottom from bed surface despite maxAx2       Balance Overall balance assessment: Needs assistance Sitting-balance support: Feet supported;No upper extremity supported Sitting balance-Leahy Scale: Good     Standing balance support: Bilateral upper extremity supported;During functional activity Standing balance-Leahy Scale: Poor                              Cognition Arousal/Alertness: Awake/alert Behavior During Therapy: Anxious Overall Cognitive Status:  (anxiety vs cognition; likely baseline; reduce distractions)                                 General Comments: very anxious with mobility, HHA and sister present during session adding to pts anxiety and confusion         General Comments General comments (skin integrity, edema, etc.): Pt on 4L/min O2 via nasal cannula during session and maintained SaO2 >95%O2 throughout, Pt limited in session by fear of falling and refuses to move away from side of bed. Pt educated  on need to be able to mobilize in her home enviroment at discharge to maintain her strength and improve health and breathing by able to get out of the bed and sit up. Pt also becomes overwhelmed when given guidance from [facility] and sister      Pertinent Vitals/Pain Pain Assessment: Faces Faces Pain Scale: Hurts even more Pain Location: bilateral LE, R knee and toes greater pain Pain Descriptors / Indicators: Grimacing;Sore Pain Intervention(s): Limited activity within patient's tolerance;Monitored during  session           PT Goals (current goals can now be found in the care plan section) Acute Rehab PT Goals PT Goal Formulation: With patient Time For Goal Achievement: 01/07/17 Potential to Achieve Goals: Good Progress towards PT goals: Not progressing toward goals - comment (mobility limited by fear of falling)    Frequency    Min 3X/week      PT Plan Current plan remains appropriate       AM-PAC PT "6 Clicks" Daily Activity  Outcome Measure  Difficulty turning over in bed (including adjusting bedclothes, sheets and blankets)?: Total Difficulty moving from lying on back to sitting on the side of the bed? : Total Difficulty sitting down on and standing up from a chair with arms (e.g., wheelchair, bedside commode, etc,.)?: Total Help needed moving to and from a bed to chair (including a wheelchair)?: A Lot Help needed walking in hospital room?: A Lot Help needed climbing 3-5 steps with a railing? : Total 6 Click Score: 8    End of Session Equipment Utilized During Treatment: Gait belt;Oxygen Activity Tolerance: Patient tolerated treatment well Patient left: with call bell/phone within reach;with family/visitor present;Other (comment) (sitting on [facility] with caregiver present & nursing staff aware) Nurse Communication: Mobility status PT Visit Diagnosis: Other abnormalities of gait and mobility (R26.89);Muscle weakness (generalized) (M62.81)     Time: 6962-9528 PT Time Calculation (min) (ACUTE ONLY): 30 min  Charges:  $Therapeutic Activity: 23-37 mins                    G Codes:       Phuoc Huy B. Migdalia Dk PT, DPT Acute Rehabilitation  [phone] Pager [phone]     [place] 12/27/2016, 2:50 PM

## 2016-12-27 NOTE — Progress Notes (Signed)
Patient placed on 35% face tent. Patient tolerating well. RT will continue to monitor.

## 2016-12-27 NOTE — Consult Note (Signed)
Consultation Note Date: 12/27/2016   Patient Name: Makayla Rodriguez  DOB: 1931-06-28  MRN: [id number]  Age / Sex: 83 y.o., female  PCP: Darcus Austin, MD Referring Physician: Florencia Reasons, MD  Reason for Consultation: Establishing goals of care  HPI/Patient Profile:  a 83 y.o. female with medical history significant for HTN, HLD,DM, OSA, pleural mass followed by pulmonary as OP, remote breast cancer, Chronic respiratory failure on 2 L O2 and on 3 L at night, presenting to the ED with increasing shortness of breath since las evening. She reports increased peripheral edema after having food indiscretion last Saturday night, including Salami. She has not been very compliant with meds over the last week. She has not been seen by a cardiologist since 2015. She was admitted to the hospital service for respiratory failure secondary to CHF exacerbation. She was placed on CPAP and was later intubated due to respiratory failure and hypercapnia. She was extubated when medically appropriate. The team began to work on discharge planning. On planned day of discharge she was noted to have decline in mental status leading to the need for BiPAP therapy. She declined BiPAP due to anxiety per the EMR. Palliative medicine was consult for a goals of care conversation.   Clinical Assessment and Goals of Care:  We met with Ms. Boliver and her sister and caregiver today at bedside. We discussed goals of care and her hospitalization thus far. We discussed the trajectory of her current illnesses and plans. She is unsure of the decisions that she wants to make regarding her goals of care at this time and is unsure of her current CODE STATUS there are her sister stated that earlier while talking to one of the care teams that she would like to be a full code. A hard choices for loving people book was left at bedside and discussed briefly with the patient  and her sister. She will be left as a full code at this time and a member of the palliative medicine team will come back to check on her and her family tomorrow to answer any questions and to help to establish further goals of care. We further discussed her pain needs and the fact that she will need to call nursing to let them know when she needs pain medication. We discussed non oral medication interventions such as hot packs as well as creams and ointments for direct muscle pain, which are ordered at this time.  Ms. Rochel power of attorney at this time is her sister as well as her brother and the directive is notated in the EMR charting. Sister is at bedside and per the sister her brother lives in [place]. It is the patient's goal to discharge home where she does have private pay health care assistance.    SUMMARY OF RECOMMENDATIONS    Code Status/Advance Care Planning:  Full code    Symptom Management:   Ms. Sonnenfeld has Tylenol as well as Voltaren gel and Ultram when necessary available for pain and she  is aware of this.  Palliative Prophylaxis:   Frequent Pain Assessment  Additional Recommendations (Limitations, Scope, Preferences):  Full Scope Treatment  Psycho-social/Spiritual:   We did discuss independence and failure to thrive with her progressive loss of mobility.  Prognosis:   Unable to determine  Discharge Planning: Home with home health home PT and OT. Continuing private pay home care.      Primary Diagnoses: Present on Admission: . Acute diastolic heart failure, NYHA class 2 ([place]) . OA (osteoarthritis) of ankle . Hypertension . Morbid obesity ([place]) . Pleural mass . Obesity hypoventilation syndrome ([place]) . Cancer of upper-outer quadrant of female breast ([place]) . Chronic respiratory failure ([place])   I have reviewed the medical record, interviewed the patient and family, and examined the patient. The following aspects are pertinent.  Past  Medical History:  Diagnosis Date  . Anxiety   . Arthritis   . CHF (congestive heart failure) ([place])    12/13 admission  . CKD (chronic kidney disease), stage III   . Complication of anesthesia    O2 sat drop during  and went into coma  . Diabetes mellitus without complication ([place])   . GERD (gastroesophageal reflux disease)   . Hypertension   . Obesity   . Obesity hypoventilation syndrome ([place])    uses O2 at home  . Seasonal allergies   . Shortness of breath    with annxiety attack  . Sleep apnea    CPAP   Social History   Social History  . Marital status: Single    Spouse name: N/A  . Number of children: 0  . Years of education: N/A   Occupational History  . Retired     Public relations account executive   Social History Main Topics  . Smoking status: Former Smoker    Packs/day: 0.50    Years: 40.00    Types: Cigarettes    Quit date: 05/30/1980  . Smokeless tobacco: Never Used  . Alcohol use No  . Drug use: No  . Sexual activity: No   Other Topics Concern  . None   Social History Narrative   Never married   No children   Family History  Problem Relation Age of Onset  . Heart failure Mother   . Gallbladder disease Father   . Pancreatic cancer Sister    Scheduled Meds: . aspirin EC  81 mg Oral q morning - 10a  . atorvastatin  10 mg Oral q1800  . azelastine  1 spray Each Nare BID  . enoxaparin (LOVENOX) injection  40 mg Subcutaneous Q24H  . fluticasone  1 spray Each Nare Daily  . insulin aspart  2-6 Units Subcutaneous TID WC  . mouth rinse  15 mL Mouth Rinse BID  . nebivolol  5 mg Oral Daily  . pantoprazole  40 mg Oral Daily  . sertraline  75 mg Oral Daily  . sodium chloride flush  3 mL Intravenous Q12H  . torsemide  60 mg Oral Daily   Continuous Infusions: . sodium chloride 250 mL (12/20/16 0800)   PRN Meds:.sodium chloride, acetaminophen (TYLENOL) oral liquid 160 mg/5 mL, diclofenac sodium, hydrALAZINE, ipratropium-albuterol, LORazepam, ondansetron  (ZOFRAN) IV, polyethylene glycol, sodium chloride, traMADol Medications Prior to Admission:  Prior to Admission medications   Medication Sig Start Date End Date Taking? Authorizing Provider  acetaminophen (TYLENOL) 500 MG tablet Take 500 mg by mouth every 6 (six) hours as needed for mild pain.    Yes [provider]  albuterol (PROAIR HFA) 108 (  90 Base) MCG/ACT inhaler Inhale 2 puffs into the lungs every 6 (six) hours as needed for wheezing or shortness of breath. 04/13/16  Yes Rigoberto Noel, MD  aspirin EC 81 MG tablet Take 81 mg by mouth every morning.   Yes [provider]  atorvastatin (LIPITOR) 10 MG tablet Take 10 mg by mouth daily. Reported on 05/14/2015 07/22/14  Yes [provider]  azelastine (ASTELIN) 0.1 % nasal spray Place 2 sprays into both nostrils 2 (two) times daily. Use in each nostril as directed 04/16/14  Yes Ollis, [name] L, NP  cetirizine (ZYRTEC) 10 MG tablet Take 10 mg by mouth daily. Reported on 05/14/2015   Yes [provider]  cholecalciferol (VITAMIN D) 1000 UNITS tablet Take 1,000 Units by mouth every morning. Reported on 05/14/2015   Yes [provider]  Chromium-Cinnamon [phone] MCG-MG CAPS Take 1 tablet by mouth at bedtime.    Yes [provider]  Dextromethorphan-Guaifenesin (TUSSIN DM PO) Take 5 mLs by mouth as needed (for cough).    Yes [provider]  diclofenac sodium (VOLTAREN) 1 % GEL Apply 4 g topically 2 (two) times daily as needed (for joint pain). For joint pain   Yes [provider]  Dietary Management Product (TOZAL PO) Take 3 tablets by mouth daily. For eyes   Yes [provider]  docusate sodium (COLACE) 100 MG capsule Take 100-200 mg by mouth at bedtime as needed for constipation.    Yes [provider]  fluticasone (FLONASE) 50 MCG/ACT nasal spray Place 2 sprays into the nose 2 (two) times daily.    Yes [provider]  glipiZIDE (GLUCOTROL XL) 10 MG  24 hr tablet Take 10 mg by mouth daily with breakfast.   Yes [provider]  hydroxypropyl methylcellulose (ISOPTO TEARS) 2.5 % ophthalmic solution Place 1 drop into both eyes 3 (three) times daily as needed (for dry eyes).   Yes [provider]  nebivolol (BYSTOLIC) 5 MG tablet Take 5 mg by mouth daily. 05/20/12  Yes Barton Dubois, MD  Omega-3 Fatty Acids (OMEGA 3 PO) Take 1 capsule by mouth every morning.   Yes [provider]  pantoprazole (PROTONIX) 40 MG tablet Take 40 mg by mouth every morning.   Yes [provider]  polyethylene glycol (MIRALAX / GLYCOLAX) packet Take 17 g by mouth daily as needed (for constipation).   Yes [provider]  sertraline (ZOLOFT) 50 MG tablet Take 75 mg by mouth daily.    Yes [provider]  sodium chloride (OCEAN) 0.65 % SOLN nasal spray Place 1 spray into both nostrils as needed for congestion. 04/16/14  Yes Ollis, Cleaster Corin, NP  traMADol (ULTRAM) 50 MG tablet Take 50 mg by mouth 2 (two) times daily.  05/20/12  Yes Barton Dubois, MD  torsemide (DEMADEX) 20 MG tablet Take 3 tablets (60 mg total) by mouth daily. 12/26/16   Florencia Reasons, MD   Allergies  Allergen Reactions  . Demerol [Meperidine] Anaphylaxis    Reaction: Hypoventilation after surgery  . Penicillins Anaphylaxis and Swelling    Has patient had a PCN reaction causing immediate rash, facial/tongue/throat swelling, SOB or lightheadedness with hypotension: Yes Has patient had a PCN reaction causing severe rash involving mucus membranes or skin necrosis: No Has patient had a PCN reaction that required hospitalization: Yes Has patient had a PCN reaction occurring within the last 10 years: No If all of the above answers are "NO", then may proceed with Cephalosporin use.   Marland Kitchen  Phenergan [Promethazine Hcl] Anaphylaxis    Reaction: hypoventilation  . Codeine Nausea And Vomiting  . Macrobid [Nitrofurantoin] Nausea And Vomiting  . Relafen [Nabumetone]  Itching and Swelling   Review of Systems  Constitutional:       Generalized pain.    Physical Exam  Constitutional: She is oriented to person, place, and time. She appears well-nourished.  HENT:  Head: Atraumatic.  Cardiovascular:  Warm and dry.  Pulmonary/Chest: Effort normal. No respiratory distress.  Abdominal: She exhibits no distension.  Musculoskeletal: She exhibits edema.  Generalized edema worse with bilateral lower extremities.  Neurological: She is alert and oriented to person, place, and time.  Skin: Skin is warm and dry.     Bruise noted to right toe and inner foot.  Psychiatric: She has a normal mood and affect. Her behavior is normal.    Vital Signs: BP (!) 144/54 (BP Location: Right Arm)   Pulse 74   Temp 98 F (36.7 C) (Oral)   Resp 20   Ht '5\' 3"'$  (1.6 m)   Wt 84.6 kg (186 lb 9.6 oz)   SpO2 98%   BMI 33.05 kg/m  Pain Assessment: 0-10   Pain Score: 2    SpO2: SpO2: 98 % O2 Device:SpO2: 98 % O2 Flow Rate: .O2 Flow Rate (L/min): 6 L/min  IO: Intake/output summary:  Intake/Output Summary (Last 24 hours) at 12/27/16 1424 Last data filed at 12/27/16 0431  Gross per 24 hour  Intake                0 ml  Output              500 ml  Net             -500 ml    LBM: Last BM Date: 12/22/16 Baseline Weight: Weight: (S) 136.1 kg (300 lb) (reports she hasn't been able to stand for a weight) Most recent weight: Weight: 84.6 kg (186 lb 9.6 oz)     Palliative Assessment/Data: 30%   Flowsheet Rows     Most Recent Value  Intake Tab  Referral Department  Hospitalist  Unit at Time of Referral  Med/Surg Unit  Palliative Care Primary Diagnosis  Pulmonary  Date Notified  12/27/16  Palliative Care Type  New Palliative care  Reason for referral  Clarify Goals of Care  Clinical Assessment  Palliative Performance Scale Score  30%  Psychosocial & Spiritual Assessment  Palliative Care Outcomes     Discussed with Dr Erlinda Hong  Time In: 1:50 Time Out: 3: 05 Time  Total: 75 min Greater than 50%  of this time was spent counseling and coordinating care related to the above assessment and plan.  Signed by:     Wadie Lessen NP Asencion Gowda, NP   Please contact Palliative Medicine Team phone at [phone] for questions and concerns.  For individual provider: See Shea Evans

## 2016-12-27 NOTE — Progress Notes (Signed)
Kirby,NP wants pt. to go stepdown and Puja,RN Rapid Response called; pt. Having SOB with talking and O2 sat wnl on 3L. Kirby,NP in to see pt. at 2004; and Dr. Erlinda Hong called re: pt. at 4; Puja,RN RRT in to see pt. at 2026; private sitter still with pt.

## 2016-12-27 NOTE — Care Management Important Message (Signed)
Important Message  Patient Details  Name: Makayla Rodriguez MRN: [id number] Date of Birth: December 08, 1931   Medicare Important Message Given:  Yes    Diamante Rubin Abena 12/27/2016, 10:03 AM

## 2016-12-28 DIAGNOSIS — Z7189 Other specified counseling: Secondary | ICD-10-CM

## 2016-12-28 DIAGNOSIS — Z515 Encounter for palliative care: Secondary | ICD-10-CM

## 2016-12-28 LAB — BLOOD GAS, ARTERIAL
Acid-Base Excess: 16.7 mmol/L — ABNORMAL HIGH (ref 0.0–2.0)
Bicarbonate: 44.2 mmol/L — ABNORMAL HIGH (ref 20.0–28.0)
DRAWN BY: 358491
FIO2: 36
O2 SAT: 94.2 %
PCO2 ART: 102 mmHg — AB (ref 32.0–48.0)
PO2 ART: 74.6 mmHg — AB (ref 83.0–108.0)
Patient temperature: 98.6
pH, Arterial: 7.26 — ABNORMAL LOW (ref 7.350–7.450)

## 2016-12-28 LAB — GLUCOSE, CAPILLARY
GLUCOSE-CAPILLARY: 227 mg/dL — AB (ref 65–99)
GLUCOSE-CAPILLARY: 141 mg/dL — AB (ref 65–99)
Glucose-Capillary: 193 mg/dL — ABNORMAL HIGH (ref 65–99)
Glucose-Capillary: 201 mg/dL — ABNORMAL HIGH (ref 65–99)
Glucose-Capillary: 207 mg/dL — ABNORMAL HIGH (ref 65–99)
Glucose-Capillary: 217 mg/dL — ABNORMAL HIGH (ref 65–99)

## 2016-12-28 NOTE — Progress Notes (Signed)
To Whom It May Concern  Patient presented with acute on chronic respiratory failure with hypercapnia. Failed C Pap and required intubation. Has improved on BiPAP. Recommending BiPAP as outpatient for continued improvement in generalized condition.  Ellizabeth Dacruz, [employer]

## 2016-12-28 NOTE — Progress Notes (Signed)
Placed on BIPAP, family and caregiver at bedside. Continuing to monitor

## 2016-12-28 NOTE — Progress Notes (Signed)
PROGRESS NOTE  Makayla Rodriguez ERD:[id number] DOB: 1932-01-13 DOA: 12/19/2016 PCP: Darcus Austin, MD  Brief Summary:  She is admitted with Hypoxia/hypercapnea respiratory failure required intubation, now extubated. Diastolic chf /oas/ obesity hypoventilation/ poor respiratory reserve. Likely d/c home on 8/1 if home bipap or home vent (trilogy) gets approved, palliative care to meet with patient again at noon on 8/1 prior to discharge.   HPI/Recap of past 24 hours: Patient has been somnolent but arousable. No new complaints reported other than somnolence. Discussed with treatment team and patient placed back on BiPAP.   Assessment/Plan: Active Problems:   Acute diastolic heart failure, NYHA class 2 ([facility])   Diabetes mellitus ([facility])   OA (osteoarthritis) of ankle   Hypertension   Morbid obesity ([facility])   Pleural mass   Obesity hypoventilation syndrome ([facility])   Cancer of upper-outer quadrant of female breast ([place])   Morbid obesity with body mass index of 45.0-49.9 in adult [facility])   Acute on chronic respiratory failure with hypoxia and hypercapnia ([facility])   Acute exacerbation of CHF (congestive heart failure) ([place])   DNR (do not resuscitate) discussion   Palliative care by specialist  Acute on Chronic respiratory failure with hypoxia and hypercapnia (at home on o2 3liters at night with cpap, 2liter day time prior to this hospitalization) - likely due to Acute on chronic diastolic heart failure, OSA, obesity hypoventilation  -Patient was admitted to hospitalist service on 7/23 with significant edema, orthopnea and dyspnea  and weight gain, CXR small L pleural effusion , Osats 70s in RA, she failed cpap, she did not improved on bipap. She was transferred to icu and intubated on 7/24 am -she is diuresed, has improved, extubated on 7/25, transferred  Back to hospitalist service on 7/27. -  she received  lasix 40mg  bid, transition to oral torsamide on 7/29 -She became very drowsy on 7/30 pm, stat  abg again showed co2 retentionat 105, respiratory acidosis PH7.2, she was transferred back to stepdown and put put bipap, she improved on bipap. - Prior physician caring for patient discussed with case with pulmonary critical care, per pulmonary critical care, qhs bipap is crucial for her survival, consider palliative care referral if she is not able to get bipap or not able to tolerate bipap/home vent -palliative care consulted, input appreciated -case manager working on getting approval for home bipap/home vent, can discharge home if equipment able to deliver to her on 8/1. Discussed with care giver at bedside. - ABG obtained which showed elevated CO2 levels. I suspect CO2 narcosis and recommended placing patient on BiPAP  Hypokalemia:  - Resolved,  Mag 1.9  noninsulin dependent dm2,  A1c7.7 Continue to hold home oral diabetic medications.  On meal coverage insulin in the hospital Am blood sugar 161  Hypertension, bp stable on bystolic/torsamide   Hyperlipidemia Continue home statins   Anemia of chronic disease  Hemoglobin stable at  10.4 .   Depression Continue home Zoloft    Morbid obesity:/OSA/obesity hypoventilation syndrome/chronic c02 retention Body mass index is 49.87 kg/m. she failed cpap, need bipap or home vent   FTT;  PT/OT eval  Code Status: full  Family Communication: patient  And family at bedside  Disposition Plan: home with home health (patient declined snf),  awaiting BiPAP approval. Discussed with case manager  Consultants:  Patient was admitted to hospitalist service on 7/23, transferred to icu due to respiratory distress and intubated on 7/24 am  Patient is extubated and transferred back to hospitalist service on 7/27  Procedures:  Intubation on 7/24 am, extubation on 7/25 am  bipap  Antibiotics:  none   Objective: BP (!) 139/48 (BP Location: Right Arm)   Pulse 70   Temp (!) 97.5 F (36.4 C) (Oral)   Resp (!) 25   Ht 5\' 3"   (1.6 m)   Wt 127.7 kg (281 lb 8 oz)   SpO2 98%   BMI 49.87 kg/m   Intake/Output Summary (Last 24 hours) at 12/28/16 1733 Last data filed at 12/28/16 1648  Gross per 24 hour  Intake              600 ml  Output             1850 ml  Net            -1250 ml   Filed Weights   12/25/16 0501 12/26/16 0529 12/28/16 0650  Weight: 124.2 kg (273 lb 13 oz) 84.6 kg (186 lb 9.6 oz) 127.7 kg (281 lb 8 oz)    Exam:   General:  Obese, NAD, Somnolent but arousable  Cardiovascular: RRR, no gallops or rubs  Respiratory: diminished at basis, no wheezing, no rales, no rhonchi  Abdomen: Soft/ND/NT, positive BS  Musculoskeletal: Equal tone  Neuro: aaox3  Data Reviewed: Basic Metabolic Panel:  Recent Labs Lab 12/22/16 0216 12/24/16 0507 12/25/16 0450 12/26/16 0636 12/27/16 0233  NA 145 143 141 143 142  K 3.5 3.2* 4.6 4.0 4.2  CL 93* 95* 95* 96* 95*  CO2 38* 39* 37* 39* 37*  GLUCOSE 104* 165* 188* 210* 161*  BUN 31* 27* 35* 30* 30*  CREATININE 0.94 0.96 1.05* 0.96 0.97  CALCIUM 9.0 8.8* 8.8* 9.1 9.4  MG 1.8 1.9 1.9  --   --   PHOS 3.3  --   --   --   --    Liver Function Tests: No results for input(s): AST, ALT, ALKPHOS, BILITOT, PROT, ALBUMIN in the last 168 hours. No results for input(s): LIPASE, AMYLASE in the last 168 hours. No results for input(s): AMMONIA in the last 168 hours. CBC:  Recent Labs Lab 12/22/16 0216  WBC 8.4  HGB 10.2*  HCT 33.9*  MCV 95.2  PLT 223   Cardiac Enzymes:   No results for input(s): CKTOTAL, CKMB, CKMBINDEX, TROPONINI in the last 168 hours. BNP (last 3 results)  Recent Labs  12/19/16 0729  BNP 431.8*    ProBNP (last 3 results) No results for input(s): PROBNP in the last 8760 hours.  CBG:  Recent Labs Lab 12/28/16 0031 12/28/16 0513 12/28/16 0756 12/28/16 1110 12/28/16 1609  GLUCAP 193* 201* 207* 227* 217*    Recent Results (from the past 240 hour(s))  MRSA PCR Screening     Status: None   Collection Time: 12/20/16   7:20 AM  Result Value Ref Range Status   MRSA by PCR NEGATIVE NEGATIVE Final    Comment:        The GeneXpert MRSA Assay (FDA approved for NASAL specimens only), is one component of a comprehensive MRSA colonization surveillance program. It is not intended to diagnose MRSA infection nor to guide or monitor treatment for MRSA infections.      Studies: No results found.  Scheduled Meds: . aspirin EC  81 mg Oral q morning - 10a  . atorvastatin  10 mg Oral q1800  . azelastine  1 spray Each Nare BID  . enoxaparin (LOVENOX) injection  40 mg Subcutaneous Q24H  . fluticasone  1 spray Each Nare  Daily  . insulin aspart  2-6 Units Subcutaneous TID WC  . mouth rinse  15 mL Mouth Rinse BID  . nebivolol  5 mg Oral Daily  . pantoprazole  40 mg Oral Daily  . sertraline  75 mg Oral Daily  . sodium chloride flush  3 mL Intravenous Q12H  . torsemide  60 mg Oral Daily    Continuous Infusions: . sodium chloride 250 mL (12/20/16 0800)     Time spent: > 35 mins, more than 50% time spent on coordination of care  Velvet Bathe MD, PhD  [facility] Pager [phone]. If 7PM-7AM, please contact night-coverage at [email or web address], password [facility] 12/28/2016, 5:33 PM  LOS: 8 days

## 2016-12-28 NOTE — Progress Notes (Signed)
Daily Progress Note   Patient Name: Makayla Rodriguez       Date: 12/28/2016 DOB: 1932-02-24  Age: 83 y.o. MRN#: [id number] Attending Physician: Velvet Bathe, MD Primary Care Physician: Darcus Austin, MD Admit Date: 12/19/2016  Reason for Consultation/Follow-up: Establishing goals of care  Subjective: Makayla Rodriguez was noted today to be obtunded and appeared to be somewhat dyspneic. Her sister and her brother both were at bedside. Respiratory therapy at bedside drawing ABG and reapplying BiPAP. We discussed decompensation and disease trajectory, as well as overall failure to thrive. We will plan to see patient again at 10 AM tomorrow to reattempt visit for discussion of goals of care with patient and family members.   Length of Stay: 8  Current Medications: Scheduled Meds:  . aspirin EC  81 mg Oral q morning - 10a  . atorvastatin  10 mg Oral q1800  . azelastine  1 spray Each Nare BID  . enoxaparin (LOVENOX) injection  40 mg Subcutaneous Q24H  . fluticasone  1 spray Each Nare Daily  . insulin aspart  2-6 Units Subcutaneous TID WC  . mouth rinse  15 mL Mouth Rinse BID  . nebivolol  5 mg Oral Daily  . pantoprazole  40 mg Oral Daily  . sertraline  75 mg Oral Daily  . sodium chloride flush  3 mL Intravenous Q12H  . torsemide  60 mg Oral Daily    Continuous Infusions: . sodium chloride 250 mL (12/20/16 0800)    PRN Meds: sodium chloride, acetaminophen (TYLENOL) oral liquid 160 mg/5 mL, diclofenac sodium, hydrALAZINE, ipratropium-albuterol, LORazepam, ondansetron (ZOFRAN) IV, polyethylene glycol, sodium chloride, traMADol  Physical Exam  Constitutional: No distress.  Eyes closed and only responding to touch stimuli briefly before falling back asleep.   HENT:  Head: Normocephalic and  atraumatic.  Pulmonary/Chest:  Dyspnea noted  Abdominal: She exhibits no distension.  Musculoskeletal: She exhibits edema.  Neurological:  Briefly alert to touch and then falls back asleep.            Vital Signs: BP (!) 119/51 (BP Location: Right Arm)   Pulse 71   Temp (!) 97.5 F (36.4 C) (Oral)   Resp (!) 22   Ht 5\' 3"  (1.6 m)   Wt 127.7 kg (281 lb 8 oz)   SpO2 99%   BMI 49.87  kg/m  SpO2: SpO2: 99 % O2 Device: O2 Device: Nasal Cannula O2 Flow Rate: O2 Flow Rate (L/min): 2 L/min  Intake/output summary:  Intake/Output Summary (Last 24 hours) at 12/28/16 1231 Last data filed at 12/28/16 0800  Gross per 24 hour  Intake              240 ml  Output             2450 ml  Net            -2210 ml   LBM: Last BM Date: 12/22/16 Baseline Weight: Weight: (S) 136.1 kg (300 lb) (reports she hasn't been able to stand for a weight) Most recent weight: Weight: 127.7 kg (281 lb 8 oz)       Palliative Assessment/Data:    Flowsheet Rows     Most Recent Value  Intake Tab  Referral Department  Hospitalist  Unit at Time of Referral  Med/Surg Unit  Palliative Care Primary Diagnosis  Pulmonary  Date Notified  12/27/16  Palliative Care Type  New Palliative care  Reason for referral  Clarify Goals of Care  Clinical Assessment  Palliative Performance Scale Score  30%  Psychosocial & Spiritual Assessment  Palliative Care Outcomes      Patient Active Problem List   Diagnosis Date Noted  . DNR (do not resuscitate) discussion   . Palliative care by specialist   . Acute exacerbation of CHF (congestive heart failure) ([place]) 12/19/2016  . Diabetes mellitus with complication ([place])   . Hyperlipidemia   . Acute on chronic respiratory failure with hypoxia and hypercapnia ([place]) 12/31/2014  . Morbid obesity with body mass index of 45.0-49.9 in adult [facility]) 09/20/2014  . Acute hypercapnic respiratory failure ([place])   . Dyspnea 09/28/2013  . Allergic rhinitis 09/11/2013  . Cancer of  upper-outer quadrant of female breast ([place]) 03/07/2013  . OSA (obstructive sleep apnea) 08/09/2012  . Obesity hypoventilation syndrome ([place]) 08/09/2012  . Pleural mass 05/15/2012  . Acute diastolic heart failure, NYHA class 2 ([place]) 05/11/2012  . Diabetes mellitus ([place]) 05/11/2012  . OA (osteoarthritis) of ankle 05/11/2012  . Hypertension 05/11/2012  . Morbid obesity ([place]) 05/11/2012    Palliative Care Assessment & Plan    Assessment: Patient appears to be obtunded at this time. Respiratory therapy at bedside drawing ABGs and reapplying BiPAP. Unable to discuss goals of care at this time.  We did discuss with the family the likely trajectory of her illnesses and disease processes. We discussed the importance of further discussions.   Recommendations/Plan:  Plan is to see patient and family again tomorrow morning at 10 AM for conversational goals of care. The concept of home palliative care was introduced and at this time family is amenable.   Goals of Care and Additional Recommendations:  Limitations on Scope of Treatment: Full Scope Treatment- patient remains a full code at this time  Code Status:    Code Status Orders        Start     Ordered   12/19/16 1013  Full code  Continuous     12/19/16 1019    Code Status History    Date Active Date Inactive Code Status Order ID Comments User Context   04/12/2014  9:11 PM 04/16/2014  5:57 PM Full Code [id number]  Guy Begin, MD ED   09/28/2013  5:25 AM 09/28/2013  5:25 AM Full Code [id number]  Toy Baker, MD Inpatient   09/28/2013  5:25 AM 10/03/2013  6:46 PM  Full Code [id number]  Toy Baker, MD Inpatient   04/05/2013  7:16 PM 04/06/2013  6:40 PM Full Code [id number]  Fanny Skates, MD Inpatient   05/11/2012  9:09 PM 05/21/2012  5:16 PM Full Code [id number]  Earma Reading, RN Inpatient    Advance Directive Documentation     Most Recent Value  Type of Advance Directive  Healthcare Power of Attorney    Pre-existing out of facility DNR order (yellow form or pink MOST form)  -  "MOST" Form in Place?  -       Prognosis:   Unable to determine  Discharge Planning:  To Be Determined- case management working on home BiPAP at this time. We will continue to follow daily.   Care plan was discussed with sister and brother.  Thank you for allowing the Palliative Medicine Team to assist in the care of this patient.   Time In: 12:00 Time Out: 12:35 Total Time 35 minutes Prolonged Time Billed  No       Greater than 50%  of this time was spent counseling and coordinating care related to the above assessment and plan.  Asencion Gowda, NP   Wadie Lessen NP   Please contact Palliative Medicine Team phone at [phone] for questions and concerns.

## 2016-12-28 NOTE — Progress Notes (Signed)
Patient talking, 02 3LNC intact with saturation of 98%. HOB elevated; BP 139/48, HR 80, RR22, Patient is NSR on monitor

## 2016-12-28 NOTE — Progress Notes (Signed)
Awake, talking and eating. Took off BIPAP so patient could eat, sat is 98%-100% ON 3 liters [place] .; Patient aware that BIPAP will be worn for at least 8 hours tonight, verbalizes understanding. Patients sister and caregiver at bedside.

## 2016-12-28 NOTE — Progress Notes (Signed)
Patient not awake enough to take am medications. Patient is lethargic, notified Dr. Wendee Beavers; Orders obtained for ABG and to place on BIPAP; notified RT, Heather. Pallative Care representatives and family (Brother, sister and caretaker) at bedside.

## 2016-12-29 ENCOUNTER — Inpatient Hospital Stay (HOSPITAL_COMMUNITY)
Admission: RE | Admit: 2016-12-29 | Discharge: 2017-01-17 | Disposition: A | Discharge status: Home / Self Care | Payer: Medicare Other | Source: Other Acute Inpatient Hospital | Attending: Internal Medicine | Admitting: Internal Medicine

## 2016-12-29 DIAGNOSIS — I5030 Unspecified diastolic (congestive) heart failure: Secondary | ICD-10-CM | POA: Diagnosis not present

## 2016-12-29 DIAGNOSIS — E662 Morbid (severe) obesity with alveolar hypoventilation: Secondary | ICD-10-CM | POA: Diagnosis not present

## 2016-12-29 DIAGNOSIS — I5033 Acute on chronic diastolic (congestive) heart failure: Secondary | ICD-10-CM | POA: Diagnosis present

## 2016-12-29 DIAGNOSIS — T148XXA Other injury of unspecified body region, initial encounter: Secondary | ICD-10-CM

## 2016-12-29 DIAGNOSIS — Z515 Encounter for palliative care: Secondary | ICD-10-CM | POA: Diagnosis not present

## 2016-12-29 DIAGNOSIS — J9690 Respiratory failure, unspecified, unspecified whether with hypoxia or hypercapnia: Secondary | ICD-10-CM

## 2016-12-29 DIAGNOSIS — R532 Functional quadriplegia: Secondary | ICD-10-CM | POA: Diagnosis present

## 2016-12-29 DIAGNOSIS — J449 Chronic obstructive pulmonary disease, unspecified: Secondary | ICD-10-CM | POA: Diagnosis not present

## 2016-12-29 DIAGNOSIS — J9621 Acute and chronic respiratory failure with hypoxia: Secondary | ICD-10-CM | POA: Diagnosis not present

## 2016-12-29 DIAGNOSIS — J9600 Acute respiratory failure, unspecified whether with hypoxia or hypercapnia: Secondary | ICD-10-CM | POA: Diagnosis not present

## 2016-12-29 DIAGNOSIS — D649 Anemia, unspecified: Secondary | ICD-10-CM | POA: Diagnosis present

## 2016-12-29 DIAGNOSIS — Z853 Personal history of malignant neoplasm of breast: Secondary | ICD-10-CM | POA: Diagnosis not present

## 2016-12-29 DIAGNOSIS — I130 Hypertensive heart and chronic kidney disease with heart failure and stage 1 through stage 4 chronic kidney disease, or unspecified chronic kidney disease: Secondary | ICD-10-CM | POA: Diagnosis present

## 2016-12-29 DIAGNOSIS — E119 Type 2 diabetes mellitus without complications: Secondary | ICD-10-CM | POA: Diagnosis not present

## 2016-12-29 DIAGNOSIS — E1122 Type 2 diabetes mellitus with diabetic chronic kidney disease: Secondary | ICD-10-CM | POA: Diagnosis present

## 2016-12-29 DIAGNOSIS — J9620 Acute and chronic respiratory failure, unspecified whether with hypoxia or hypercapnia: Secondary | ICD-10-CM | POA: Diagnosis not present

## 2016-12-29 DIAGNOSIS — N183 Chronic kidney disease, stage 3 (moderate): Secondary | ICD-10-CM | POA: Diagnosis present

## 2016-12-29 DIAGNOSIS — M19071 Primary osteoarthritis, right ankle and foot: Secondary | ICD-10-CM | POA: Diagnosis present

## 2016-12-29 DIAGNOSIS — F329 Major depressive disorder, single episode, unspecified: Secondary | ICD-10-CM | POA: Diagnosis present

## 2016-12-29 DIAGNOSIS — E440 Moderate protein-calorie malnutrition: Secondary | ICD-10-CM | POA: Diagnosis present

## 2016-12-29 DIAGNOSIS — G894 Chronic pain syndrome: Secondary | ICD-10-CM | POA: Diagnosis present

## 2016-12-29 DIAGNOSIS — J9602 Acute respiratory failure with hypercapnia: Secondary | ICD-10-CM | POA: Diagnosis not present

## 2016-12-29 DIAGNOSIS — E785 Hyperlipidemia, unspecified: Secondary | ICD-10-CM | POA: Diagnosis present

## 2016-12-29 DIAGNOSIS — Z7189 Other specified counseling: Secondary | ICD-10-CM | POA: Diagnosis not present

## 2016-12-29 DIAGNOSIS — I350 Nonrheumatic aortic (valve) stenosis: Secondary | ICD-10-CM | POA: Diagnosis present

## 2016-12-29 DIAGNOSIS — Z6841 Body Mass Index (BMI) 40.0 and over, adult: Secondary | ICD-10-CM | POA: Diagnosis not present

## 2016-12-29 DIAGNOSIS — I5040 Unspecified combined systolic (congestive) and diastolic (congestive) heart failure: Secondary | ICD-10-CM | POA: Diagnosis not present

## 2016-12-29 DIAGNOSIS — G4733 Obstructive sleep apnea (adult) (pediatric): Secondary | ICD-10-CM | POA: Diagnosis present

## 2016-12-29 DIAGNOSIS — J9622 Acute and chronic respiratory failure with hypercapnia: Secondary | ICD-10-CM | POA: Diagnosis not present

## 2016-12-29 LAB — GLUCOSE, CAPILLARY
GLUCOSE-CAPILLARY: 160 mg/dL — AB (ref 65–99)
Glucose-Capillary: 166 mg/dL — ABNORMAL HIGH (ref 65–99)
Glucose-Capillary: 237 mg/dL — ABNORMAL HIGH (ref 65–99)
Glucose-Capillary: 237 mg/dL — ABNORMAL HIGH (ref 65–99)

## 2016-12-29 MED ORDER — SERTRALINE HCL 25 MG PO TABS: 75.0000 mg | ORAL_TABLET | Freq: Every day | ORAL | Status: DC

## 2016-12-29 NOTE — Care Management Important Message (Signed)
Important Message  Patient Details  Name: Makayla Rodriguez MRN: [id number] Date of Birth: 1931-12-31   Medicare Important Message Given:  Yes    Nathen May 12/29/2016, 10:28 AM

## 2016-12-29 NOTE — Progress Notes (Signed)
Daily Progress Note   Patient Name: Makayla Rodriguez       Date: 12/29/2016 DOB: [birth year]  Age: 83 y.o. MRN#: [id number] Attending Physician: Velvet Bathe, MD Primary Care Physician: Darcus Austin, MD Admit Date: 12/19/2016  Reason for Consultation/Follow-up: Establishing goals of care  Subjective: Makayla Rodriguez was noted today to be alert and oriented and resting in bed with her sister and her private pay caregiver at bedside. During conversation she was noted to desaturate while wearing her nasal cannula while trying to converse. We discussed her overall failure to thrive, disease trajectory, and the tenuosity of her current status. She made it very clear that she does want to be full scope of treatment including resuscitation attempts and intubation as needed. She made it very clear that she wants to live with any life sustaining measures possible. It was discussed with her that she would need to be willing to wear her BiPAP as well as her oxygen when no BiPAP was present due to the chances of her oxygen saturation dropping as well as her becoming hypercapnic with decreased alertness and mentation leading to potential death.   Length of Stay: 9  Current Medications: Scheduled Meds:  . aspirin EC  81 mg Oral q morning - 10a  . atorvastatin  10 mg Oral q1800  . azelastine  1 spray Each Nare BID  . enoxaparin (LOVENOX) injection  40 mg Subcutaneous Q24H  . fluticasone  1 spray Each Nare Daily  . insulin aspart  2-6 Units Subcutaneous TID WC  . mouth rinse  15 mL Mouth Rinse BID  . nebivolol  5 mg Oral Daily  . pantoprazole  40 mg Oral Daily  . sertraline  75 mg Oral Daily  . sodium chloride flush  3 mL Intravenous Q12H  . torsemide  60 mg Oral Daily    Continuous Infusions: . sodium chloride  250 mL (12/20/16 0800)    PRN Meds: sodium chloride, acetaminophen (TYLENOL) oral liquid 160 mg/5 mL, diclofenac sodium, hydrALAZINE, ipratropium-albuterol, LORazepam, ondansetron (ZOFRAN) IV, polyethylene glycol, sodium chloride, traMADol  Physical Exam  Constitutional:  Alert and oriented sitting in bed talking with sister and caregiver.   HENT:  Head: Normocephalic and atraumatic.  Pulmonary/Chest: Effort normal.  Even and unlabored  Abdominal: She exhibits no distension.  Musculoskeletal: She exhibits edema.  Vital Signs: BP 138/64 (BP Location: Right Arm)   Pulse 80   Temp 98.8 F (37.1 C) (Oral)   Resp (!) 26   Ht 5\' 3"  (1.6 m)   Wt 126.5 kg (278 lb 12.8 oz)   SpO2 94%   BMI 49.39 kg/m  SpO2: SpO2: 94 % O2 Device: O2 Device: Nasal Cannula O2 Flow Rate: O2 Flow Rate (L/min): 3 L/min  Intake/output summary:   Intake/Output Summary (Last 24 hours) at 12/29/16 1343 Last data filed at 12/29/16 0800  Gross per 24 hour  Intake              580 ml  Output             1950 ml  Net            -1370 ml   LBM: Last BM Date: 12/22/16 Baseline Weight: Weight: (S) 136.1 kg (300 lb) (reports she hasn't been able to stand for a weight) Most recent weight: Weight: 126.5 kg (278 lb 12.8 oz)       Palliative Assessment/Data: 30%    Flowsheet Rows     Most Recent Value  Intake Tab  Referral Department  Hospitalist  Unit at Time of Referral  Med/Surg Unit  Palliative Care Primary Diagnosis  Pulmonary  Date Notified  12/27/16  Palliative Care Type  New Palliative care  Reason for referral  Clarify Goals of Care  Clinical Assessment  Palliative Performance Scale Score  30%  Psychosocial & Spiritual Assessment  Palliative Care Outcomes      Patient Active Problem List   Diagnosis Date Noted  . DNR (do not resuscitate) discussion   . Palliative care by specialist   . Acute exacerbation of CHF (congestive heart failure) ([place]) 12/19/2016  . Diabetes mellitus  with complication ([place])   . Hyperlipidemia   . Acute on chronic respiratory failure with hypoxia and hypercapnia ([place]) 12/31/2014  . Morbid obesity with body mass index of 45.0-49.9 in adult [facility]) 09/20/2014  . Acute hypercapnic respiratory failure ([place])   . Dyspnea 09/28/2013  . Allergic rhinitis 09/11/2013  . Cancer of upper-outer quadrant of female breast ([place]) 03/07/2013  . OSA (obstructive sleep apnea) 08/09/2012  . Obesity hypoventilation syndrome ([place]) 08/09/2012  . Pleural mass 05/15/2012  . Acute diastolic heart failure, NYHA class 2 ([place]) 05/11/2012  . Diabetes mellitus ([place]) 05/11/2012  . OA (osteoarthritis) of ankle 05/11/2012  . Hypertension 05/11/2012  . Morbid obesity ([place]) 05/11/2012    Palliative Care Assessment & Plan    Assessment: Patient is alert and oriented at this time with desires for any available interventions to prolong life.  We did re-discuss with the family the likely trajectory of her illnesses and disease processes. We discussed the importance of further discussions.  Recommendations/Plan:  She is unsure at this time if she would be willing to have home hospice or home palliative care.when medically stable discharge home with home health and current 24 hour caregivers. Family has been given information on how to self refer for outpatient palliative care or hospice. Palliative medicine service will be signing off today, please call with future needs.   Goals of Care and Additional Recommendations:  Limitations on Scope of Treatment: Full Scope Treatment- patient remains a full code at this time  Code Status:    Code Status Orders        Start     Ordered   12/19/16 1013  Full code  Continuous     12/19/16  86    Code Status History    Date Active Date Inactive Code Status Order ID Comments User Context   04/12/2014  9:11 PM 04/16/2014  5:57 PM Full Code [id number]  Guy Begin, MD ED   09/28/2013  5:25 AM 09/28/2013  5:25 AM Full Code  [id number]  Toy Baker, MD Inpatient   09/28/2013  5:25 AM 10/03/2013  6:46 PM Full Code [id number]  Toy Baker, MD Inpatient   04/05/2013  7:16 PM 04/06/2013  6:40 PM Full Code [id number]  Fanny Skates, MD Inpatient   05/11/2012  9:09 PM 05/21/2012  5:16 PM Full Code [id number]  Earma Reading, RN Inpatient    Advance Directive Documentation     Most Recent Value  Type of Advance Directive  Healthcare Power of Attorney  Pre-existing out of facility DNR order (yellow form or pink MOST form)  -  "MOST" Form in Place?  -       Prognosis:   Unable to determine  Discharge Planning:  To Be Determined- case management working on home BiPAP at this time. We will continue to follow daily.   Care plan was discussed with sister and in home caregivers.  Thank you for allowing the Palliative Medicine Team to assist in the care of this patient.   Time In: 1000 Time Out: 1035 Total Time 35 minutes Prolonged Time Billed  No       Greater than 50%  of this time was spent counseling and coordinating care related to the above assessment and plan.  Discussed with case management   Asencion Gowda, NP  Please contact Palliative Medicine Team phone at [phone] for questions and concerns.

## 2016-12-29 NOTE — Care Management Note (Signed)
Case Management Note Original Note Created Sherrilyn Rist [phone] 12/23/2016, 12:17 PM   Patient Details  Name: Makayla Rodriguez MRN: [id number] Date of Birth: 26-Oct-1931  Subjective/Objective:    CHF               Action/Plan: CM talked to patient at the bedside; PCP: Darcus Austin, MD; has private insurance with Medicare/ AARP with prescription drug coverage; DCP - return home with continuation of care with 24 hr caregivers; DME- home oxygen, CPAP, she states that she does not want a hospital bed at this time; CM informed patient that when she decides that she wants a hospital bed to call her PCP and the can make the referral to the DME company; No needs identified at this time. CM will continue to follow for DCP.  Expected Discharge Date:               Expected Discharge Plan:  Long Term Acute Care (LTAC)  In-House Referral:   [facility]    Discharge planning Services  CM Consult  Post Acute Care Choice:  Home Health, Durable Medical Equipment Choice offered to:  Patient, Sibling  DME Arranged:  Bipap DME Agency:     HH Arranged:  RN, PT, OT, Social Work, Education administrator [place] Agency:  [facility] [employer]  Status of Service:  Completed, signed off  If discussed at [employer], dates discussed:    Discharge Disposition: LTACH- [facility]   Additional Comments:  12/29/16- 1400- Marvetta Gibbons RN, CM- earlier this am call made to [employer]- provider of pt's home 02 and cpap at home regarding home bipap- spoke with Aaron Edelman- pt has not been approved for home bipap and per Aaron Edelman at [employer] will need an updated sleep study before [place] can be approved for home- discussed this with MD and think that [place] may be a good option for pt to transition to for ongoing resp. Needs- order for [facility] referral given- [facility] with [facility] and [facility] which can offer beds today- spoke with pt and sister at bedside to discuss  LTACH option- pt [facility] at [facility] in past- is agreeable to look at both LTACHS- but states that she would prefer [facility]. Had both reps come speak with pt- pt has chosen [facility] for ongoing care- Per Santiago Glad with [facility] can offer a bed to patient today and pt can d/c later today to [facility]- have notified MD of this and plan will be to d/c today to [facility] for ongoing resp. Needs/Bipap needs and therapy- they will work with pt to arrange home Bipap when pt stable for d/c home. -- [facility] will still deliver hospital bed to home-  [place] Patient to be BIPAP provider  12/28/16- 1000- Marvetta Gibbons RN, CM- spoke with Santiago Glad at [facility] regarding NIV- pt was not approved for NIV and will need to seek approval for BIPAP- have spoken with Dr. Wendee Beavers and orders placed for BIPAP- have faxed order to [facility] along with paperwork- and call made to f/u-   12/27/16- 1500- Masen Luallen RN, CM- pt was for d/c yesterday to home- with [facility] services- had resp. Decline prior to discharge and needed bipap- spoke with Stanton Kidney at [place] at [facility]- following for [facility] needs confirmed choice with pt and pt's sister. Per MD pt will need bipap for home- per sister has cpap at home along with home 02- this is through [facility]- call made to DME company to  ask about Bipap- pt would need to have approval and per [place] Patient this process can take up to 5 days- also spoke with karen with [facility]- who also states Bipap approval process may take up to 5 days. Pt and sister want to use [facility] for other DME needs- and willing to have them look into Bipap vs NIV option- MD has filled out paperwork for NIV- which if approved may take less than 5 days. Orders for hospital bed and hoyer lift placed for home. Pt will need non emergent transport when ready for discharge. CM will continue to follow. PC also met with pt - CM will follow after 2nd meeting for possible community PC follow up- pt would prefer to have sister here for conversation.  Call made to sister and updated her on DME status- checked with her on PC mtg-  Per sister- meeting with PC is set for 12/28/16 between 12:00 and 1pm-- CM will f/u with pt and sister following meeting.   Dawayne Patricia, RN 12/29/2016, 3:51 PM

## 2016-12-29 NOTE — Progress Notes (Signed)
Occupational Therapy Treatment Patient Details Name: Makayla Rodriguez MRN: [id number] DOB: [birth year] Today's Date: 12/29/2016    History of present illness 83 year old female with PMH significant for CHF, Anxiety, CKD III, DM, GERD, OHS on home O2,and OSA on CPAP. She presented to [facility] 7/24 with SOB and peripheral edema. She was admitted to the hospitalist service for respiratory failure secondary to CHF exacerbation. worsening resp distress and transferred to ICU and intubated 7/24, extubated 7/25   OT comments  Pt limited by Rt LE pain today.  She requires mod A +2 to move to EOB, and worked on sitting balance, trunk control, and UE exercises while sitting EOB.  She wishes to discharge home with 24 hour assist.   Follow Up Recommendations  Home health OT;Supervision/Assistance - 24 hour;Other (comment)    Equipment Recommendations  3 in 1 bedside commode    Recommendations for Other Services      Precautions / Restrictions Precautions Precautions: Fall Restrictions Weight Bearing Restrictions: No       Mobility Bed Mobility Overal bed mobility: Needs Assistance Bed Mobility: Supine to Sit;Sit to Supine     Supine to sit: Max assist;+2 for physical assistance;HOB elevated Sit to supine: Max assist;+2 for physical assistance   General bed mobility comments: assist to bring bilat LE to EOB, R LE needed more assist due to pain, elevate trunk into sitting, and scoot hips toward EOB with use of bed pad; cues for sequencing  Transfers                 General transfer comment: transfer postponed to next session after r/o fx    Balance Overall balance assessment: Needs assistance Sitting-balance support: Feet supported;No upper extremity supported Sitting balance-Leahy Scale: Good                                     ADL either performed or assessed with clinical judgement   ADL                                         General ADL  Comments: unable to attempt LB ADLs from sit to stand due to Lt foot pain      Vision       Perception     Praxis      Cognition Arousal/Alertness: Awake/alert Behavior During Therapy: Anxious Overall Cognitive Status:  (anxiety vs cognition; likely baseline; reduce distractions)                                 General Comments: less anxious to sit EoB with only sister present        Exercises Exercises: General Upper Extremity;Other exercises General Exercises - Upper Extremity Shoulder Flexion: AAROM;Both;10 reps;Seated Other Exercises Other Exercises: scapular retraction in sitting x5 Other Exercises: spinal extension to neutral x5 Other Exercises: forward reach outside BoS, bilateral arms x5   Shoulder Instructions       General Comments SaO2 on 4 L O2 >90%O2 at rest, dropped to 87%O2 with movement but quickly rebounded to >90%O2 with vc for deep breathing through her nose    Pertinent Vitals/ Pain       Pain Assessment: 0-10 Faces Pain Scale: Hurts even more Pain Location: bilateral LE,  R knee and toes greater pain Pain Descriptors / Indicators: Grimacing;Sore Pain Intervention(s): Limited activity within patient's tolerance  Home Living                                          Prior Functioning/Environment              Frequency  Min 2X/week        Progress Toward Goals  OT Goals(current goals can now be found in the care plan section)  Progress towards OT goals: Not progressing toward goals - comment (due to Rt LE pain )     Plan Discharge plan remains appropriate    Co-evaluation    PT/OT/SLP Co-Evaluation/Treatment: Yes Reason for Co-Treatment: Complexity of the patient's impairments (multi-system involvement);For patient/therapist safety   OT goals addressed during session: ADL's and self-care      AM-PAC PT "6 Clicks" Daily Activity     Outcome Measure   Help from another person eating meals?:  None Help from another person taking care of personal grooming?: A Little Help from another person toileting, which includes using toliet, bedpan, or urinal?: A Lot Help from another person bathing (including washing, rinsing, drying)?: Total Help from another person to put on and taking off regular upper body clothing?: A Lot Help from another person to put on and taking off regular lower body clothing?: Total 6 Click Score: 13    End of Session    OT Visit Diagnosis: History of falling (Z91.81);Muscle weakness (generalized) (M62.81);Other abnormalities of gait and mobility (R26.89);Pain Pain - Right/Left: Right Pain - part of body: Leg   Activity Tolerance Patient limited by pain   Patient Left in bed;with call bell/phone within reach;with family/visitor present;with nursing/sitter in room   Nurse Communication Mobility status        Time: 1122-1202 OT Time Calculation (min): 40 min  Charges: OT General Charges $OT Visit: 1 Procedure OT Treatments $Therapeutic Activity: 8-22 mins  [facility], OTR/L [phone]    Lucille Passy M 12/29/2016, 4:21 PM

## 2016-12-29 NOTE — Discharge Summary (Signed)
Physician Discharge Summary  Makayla Rodriguez [id number]:[id number] DOB: [birth year] DOA: 12/19/2016  PCP: Darcus Austin, MD  Admit date: 12/19/2016 Discharge date: 12/29/2016  Time spent: > 35 minutes  Recommendations for Outpatient Follow-up:  1. Patient will require BiPAP or CPap on discharge. 2. Patient should follow-up with cardiology for diastolic CHF and moderate aortic stenosis   Discharge Diagnoses:  Active Problems:   Acute diastolic heart failure, NYHA class 2 (HCC)   Diabetes mellitus (HCC)   OA (osteoarthritis) of ankle   Hypertension   Morbid obesity (HCC)   Pleural mass   Obesity hypoventilation syndrome (HCC)   Cancer of upper-outer quadrant of female breast ([place])   Morbid obesity with body mass index of 45.0-49.9 in adult [facility])   Acute on chronic respiratory failure with hypoxia and hypercapnia (HCC)   Acute exacerbation of CHF (congestive heart failure) ([place])   DNR (do not resuscitate) discussion   Palliative care by specialist   Discharge Condition: Stable  Diet recommendation: Low sodium diet  Filed Weights   12/26/16 0529 12/28/16 0650 12/29/16 0659  Weight: 84.6 kg (186 lb 9.6 oz) 127.7 kg (281 lb 8 oz) 126.5 kg (278 lb 12.8 oz)    History of present illness:  83 y.o.femalewith medical history significant for HTN, HLD,DM, OSA, pleural mass followed by pulmonary as OP, remote breast cancer, Chronic respiratory failure on 2 L O2 and on 3 L at night, presenting to the ED with increasing shortness of breath since las evening. She reports increased peripheral edema after having food indiscretion last Saturday night, including Salami. She has not been very compliant with meds over the last week. She has not been seen by a cardiologist since 2015 Denies productive sputum. Denies rhinorrhea or hemoptysis. Denies fevers, chills, night sweats or mucositis. Denies any chest pain, chest wall pain or palpitations. Denies any abdominal pain. Has decreased appetite due to current  symptoms.Denies nausea orvomiting. Denies dizziness or vertigo. No confusion was reported. Denies any vision changes, double vision or headaches.   Patient has had CO2 narcosis which has improved with BiPAP treatment. Awaiting BiPAP or CPap for discharge. Continues to have co2 narcosis  Hospital Course:  Acute on Chronicrespiratory failure with hypoxia and hypercapnia (at home on o2 3liters at night with cpap, 2liter day time prior to this hospitalization) - likely due to Acute on chronic diastolicheart failure, OSA, obesity hypoventilation  -Patient was admitted to hospitalist service on 7/23 with significant edema, orthopnea and dyspnea  and weight gain, CXR small L pleural effusion ,Osats 70s in RA, she failed cpap, she did not improved on bipap. She was transferred to icu and intubated on 7/24 am -she is diuresed, has improved, extubated on 7/25, transferred  Back to hospitalist service on 7/27. - she received  lasix 40mg  bid, transition to oral torsamide on 7/29 -She became very drowsy on 7/30 pm, stat abg again showed co2 retentionat 105, respiratory acidosis PH7.2, she was transferred back to stepdown and put put bipap, she improved on bipap. - Prior physician caring for patient discussed with case with pulmonary critical care, per pulmonary critical care, qhs bipap is crucial for her survival, consider palliative care referral if she is not able to get bipap or not able to tolerate bipap/home vent -palliative care consulted, input appreciated -case manager assisted with trying to get bipap or home vent but unable. Will transition to [facility]. With improvement in medical condition the plan is that patient may be discharged on Bipap or Cpap home. - Patient  continues to have CO2 narcosis improved on BiPAP  Hypokalemia:  - Resolved,  Mag 1.9  noninsulin dependent dm2,  We'll continue prior to admission medication regimen  Hypertension - bp stable on bystolic/torsamide will continue  on discharge  Hyperlipidemia  - Continue home statins   Anemia of chronic disease Hemoglobin stable at  10.2 on last check  Depression Continue home Zoloft    Morbid obesity:/OSA/obesity hypoventilation syndrome/chronic c02 retention Body mass index is 49.87 kg/m. she failed cpap, need bipap or home vent   Procedures:  None  Consultations:  Palliative  Critical care team  Discharge Exam: Vitals:   12/29/16 0800 12/29/16 1207  BP: 136/62 138/64  Pulse: 84 80  Resp: (!) 22 (!) 26  Temp: 98.2 F (36.8 C) 98.8 F (37.1 C)    General: Pt in nad, alert and awake Cardiovascular: rrr, no rubs, no gallops Respiratory: no increased wob, now wheezes, poor inspiratory effort  Discharge Instructions   Discharge Instructions    Call MD for:  difficulty breathing, headache or visual disturbances    Complete by:  As directed    DME Hospital bed    Complete by:  As directed    Bed type:  Semi-electric   DME Other see comment    Complete by:  As directed    Hoyer lift   Diet - low sodium heart healthy    Complete by:  As directed    Carb modified   Diet - low sodium heart healthy    Complete by:  As directed    Discharge instructions    Complete by:  As directed    Please call your heart doctor if your legs start to retain fluids   Discharge instructions    Complete by:  As directed    Patient will need BiPAP for sleep apnea. She has ongoing CO2 narcosis and use of BiPAP helps her overall condition.   Home Health    Complete by:  As directed    To provide the following care/treatments:   PT OT RN Social work Respiratory Care     Palliative care   Increase activity slowly    Complete by:  As directed    Increase activity slowly    Complete by:  As directed      Current Discharge Medication List    CONTINUE these medications which have CHANGED   Details  sertraline (ZOLOFT) 25 MG tablet Take 3 tablets (75 mg total) by mouth daily.     torsemide (DEMADEX) 20 MG tablet Take 3 tablets (60 mg total) by mouth daily. Qty: 90 tablet, Refills: 0      CONTINUE these medications which have NOT CHANGED   Details  acetaminophen (TYLENOL) 500 MG tablet Take 500 mg by mouth every 6 (six) hours as needed for mild pain.     albuterol (PROAIR HFA) 108 (90 Base) MCG/ACT inhaler Inhale 2 puffs into the lungs every 6 (six) hours as needed for wheezing or shortness of breath. Qty: 18 g, Refills: 3    aspirin EC 81 MG tablet Take 81 mg by mouth every morning.    atorvastatin (LIPITOR) 10 MG tablet Take 10 mg by mouth daily. Reported on 05/14/2015    azelastine (ASTELIN) 0.1 % nasal spray Place 2 sprays into both nostrils 2 (two) times daily. Use in each nostril as directed Qty: 30 mL, Refills: 3    cetirizine (ZYRTEC) 10 MG tablet Take 10 mg by mouth daily. Reported on 05/14/2015  cholecalciferol (VITAMIN D) 1000 UNITS tablet Take 1,000 Units by mouth every morning. Reported on 05/14/2015    Chromium-Cinnamon [phone] MCG-MG CAPS Take 1 tablet by mouth at bedtime.     Dextromethorphan-Guaifenesin (TUSSIN DM PO) Take 5 mLs by mouth as needed (for cough).     diclofenac sodium (VOLTAREN) 1 % GEL Apply 4 g topically 2 (two) times daily as needed (for joint pain). For joint pain    Dietary Management Product (TOZAL PO) Take 3 tablets by mouth daily. For eyes    docusate sodium (COLACE) 100 MG capsule Take 100-200 mg by mouth at bedtime as needed for constipation.     fluticasone (FLONASE) 50 MCG/ACT nasal spray Place 2 sprays into the nose 2 (two) times daily.     glipiZIDE (GLUCOTROL XL) 10 MG 24 hr tablet Take 10 mg by mouth daily with breakfast.    hydroxypropyl methylcellulose (ISOPTO TEARS) 2.5 % ophthalmic solution Place 1 drop into both eyes 3 (three) times daily as needed (for dry eyes).    nebivolol (BYSTOLIC) 5 MG tablet Take 5 mg by mouth daily.    Omega-3 Fatty Acids (OMEGA 3 PO) Take 1 capsule by mouth every  morning.    pantoprazole (PROTONIX) 40 MG tablet Take 40 mg by mouth every morning.    polyethylene glycol (MIRALAX / GLYCOLAX) packet Take 17 g by mouth daily as needed (for constipation).    sodium chloride (OCEAN) 0.65 % SOLN nasal spray Place 1 spray into both nostrils as needed for congestion. Qty: 1 Bottle, Refills: 0    traMADol (ULTRAM) 50 MG tablet Take 50 mg by mouth 2 (two) times daily.        Allergies  Allergen Reactions  . Demerol [Meperidine] Anaphylaxis    Reaction: Hypoventilation after surgery  . Penicillins Anaphylaxis and Swelling    Has patient had a PCN reaction causing immediate rash, facial/tongue/throat swelling, SOB or lightheadedness with hypotension: Yes Has patient had a PCN reaction causing severe rash involving mucus membranes or skin necrosis: No Has patient had a PCN reaction that required hospitalization: Yes Has patient had a PCN reaction occurring within the last 10 years: No If all of the above answers are "NO", then may proceed with Cephalosporin use.   Marland Kitchen Phenergan [Promethazine Hcl] Anaphylaxis    Reaction: hypoventilation  . Codeine Nausea And Vomiting  . Macrobid [Nitrofurantoin] Nausea And Vomiting  . Relafen [Nabumetone] Itching and Swelling   Follow-up Information    Darcus Austin, MD. Go on 01/02/2017.   Specialty:  Family Medicine Why:  hospital discharge follow up, repeat cbc/bmp at follow up.@3 :00PM Contact information: [place] [address] [phone]        [place] [facility] Follow up in 3 week(s).   Specialty:  Cardiology Why:  for diastolic chf  and aortic stenosis Contact information: [address], [place], [facility] up.   Specialty:  [facility] Why:  They will do your home health care at your home Contact information: [place]  [address] [phone]            The results of significant diagnostics from this hospitalization (including imaging, microbiology, ancillary and laboratory) are listed below for reference.    Significant Diagnostic Studies: Dg Chest 2 View  Result Date: 12/19/2016 CLINICAL DATA:  Shortness of breath. EXAM: CHEST  2  VIEW COMPARISON:  Radiographs of Oct 10, 2016. FINDINGS: Stable cardiomediastinal silhouette. Atherosclerosis of thoracic aorta is noted. No pneumothorax is noted. Mild diffuse interstitial densities are noted throughout both lungs concerning for chronic scarring, although superimposed edema or inflammation cannot be excluded. Increased left basilar atelectasis or infiltrate is noted with mild left pleural effusion. Multilevel degenerative disc disease of thoracic spine is noted. IMPRESSION: Aortic atherosclerosis. Stable mild diffuse interstitial densities are noted throughout both lungs concerning for chronic scarring or inflammation, although superimposed acute edema or inflammation is noted. Increased left basilar atelectasis or infiltrate is noted with mild left pleural effusion. Electronically Signed   By: Marijo Conception, M.D.   On: 12/19/2016 08:00   Dg Chest Port 1 View  Result Date: 12/21/2016 CLINICAL DATA:  Respiratory failure. EXAM: PORTABLE CHEST 1 VIEW COMPARISON:  12/20/2016 . FINDINGS: Endotracheal tube approximately 1.3 cm above the carina. Proximal repositioning of approximately 2 cm should be considered. NG tube in stable position. Stable cardiomegaly. Persistent bibasilar atelectasis and infiltrates. Small left pleural effusion again noted. No pneumothorax . IMPRESSION: 1. Endotracheal tube 1.3 cm above the carina. Proximal repositioning of approximately 2 cm should be considered. NG tube in stable position. 2. Persistent unchanged bibasilar atelectasis and infiltrates. Persistent small left pleural effusion. 3. Stable cardiomegaly . Electronically Signed   By: Marcello Moores   Register   On: 12/21/2016 06:46   Dg Chest Port 1 View  Result Date: 12/20/2016 CLINICAL DATA:  Intubation EXAM: PORTABLE CHEST 1 VIEW COMPARISON:  12/20/2016 FINDINGS: Endotracheal tube tip is 1.7 cm above the carina. Nasogastric tube extends below the diaphragm and beyond the inferior edge of the image. No change in diffuse interstitial opacities. No pneumothorax. IMPRESSION: Satisfactorily positioned ETT. Electronically Signed   By: Andreas Newport M.D.   On: 12/20/2016 04:58   Dg Chest Port 1 View  Result Date: 12/20/2016 CLINICAL DATA:  Shortness of breath. EXAM: PORTABLE CHEST 1 VIEW COMPARISON:  Frontal and lateral views yesterday. Chest CT 01/15/2015 FINDINGS: Low lung volumes. Cardiomegaly with atherosclerosis and tortuosity of the thoracic aorta. Diffuse interstitial opacities may be bronchial inflammation or pulmonary edema. Blunting of left costophrenic angle, likely in part secondary to pleural based lesion is seen on prior CT. Possible left pleural effusion. No pneumothorax. The exam is otherwise unchanged. IMPRESSION: 1. Diffuse interstitial opacities may be bronchial thickening or pulmonary edema, unchanged from prior exam. 2. Left lung base opacity likely partially related to pleural-based lesion as seen on prior chest CT. Possible small pleural effusion. 3. Cardiomegaly with aortic atherosclerosis Electronically Signed   By: Jeb Levering M.D.   On: 12/20/2016 03:44   Dg Abd Portable 1v  Result Date: 12/20/2016 CLINICAL DATA:  Assess orogastric tube positioning. EXAM: PORTABLE ABDOMEN - 1 VIEW COMPARISON:  Portable chest x-ray of December 20, 2016 FINDINGS: The orogastric tube tip and proximal port lie in the gastric body. The bowel gas pattern where visualized does not suggest obstruction. A moderate colonic stool burden is observed. There are coarse lung markings at both lung bases. There are degenerative disc changes at multiple thoracic and lumbar levels. IMPRESSION: The  esophagogastric tube tip and proximal port lie in the gastric body. Electronically Signed   By: David  Martinique M.D.   On: 12/20/2016 07:30   Dg Abd Portable 1v  Result Date: 12/20/2016 CLINICAL DATA:  Orogastric tube placement EXAM: PORTABLE ABDOMEN - 1 VIEW COMPARISON:  None. FINDINGS: The orogastric tube tip just reaches the stomach. The proximal port is at the  EG junction or in the distal esophagus. The tube should be advanced for optimal placement. Visible portions of the abdominal gas pattern are unremarkable IMPRESSION: The orogastric tube just reaches the stomach and should be advanced 5-10 cm for optimal placement. These results will be called to the ordering clinician or representative by the Radiologist Assistant, and communication documented in the PACS or zVision Dashboard. Electronically Signed   By: Andreas Newport M.D.   On: 12/20/2016 04:59    Microbiology: Recent Results (from the past 240 hour(s))  MRSA PCR Screening     Status: None   Collection Time: 12/20/16  7:20 AM  Result Value Ref Range Status   MRSA by PCR NEGATIVE NEGATIVE Final    Comment:        The GeneXpert MRSA Assay (FDA approved for NASAL specimens only), is one component of a comprehensive MRSA colonization surveillance program. It is not intended to diagnose MRSA infection nor to guide or monitor treatment for MRSA infections.      Labs: Basic Metabolic Panel:  Recent Labs Lab 12/24/16 0507 12/25/16 0450 12/26/16 0636 12/27/16 0233  NA 143 141 143 142  K 3.2* 4.6 4.0 4.2  CL 95* 95* 96* 95*  CO2 39* 37* 39* 37*  GLUCOSE 165* 188* 210* 161*  BUN 27* 35* 30* 30*  CREATININE 0.96 1.05* 0.96 0.97  CALCIUM 8.8* 8.8* 9.1 9.4  MG 1.9 1.9  --   --    Liver Function Tests: No results for input(s): AST, ALT, ALKPHOS, BILITOT, PROT, ALBUMIN in the last 168 hours. No results for input(s): LIPASE, AMYLASE in the last 168 hours. No results for input(s): AMMONIA in the last 168 hours. CBC: No  results for input(s): WBC, NEUTROABS, HGB, HCT, MCV, PLT in the last 168 hours. Cardiac Enzymes: No results for input(s): CKTOTAL, CKMB, CKMBINDEX, TROPONINI in the last 168 hours. BNP: BNP (last 3 results)  Recent Labs  12/19/16 0729  BNP 431.8*    ProBNP (last 3 results) No results for input(s): PROBNP in the last 8760 hours.  CBG:  Recent Labs Lab 12/28/16 1609 12/28/16 2120 12/29/16 0606 12/29/16 0817 12/29/16 1201  GLUCAP 217* 141* 166* 160* 237*     Signed:  Velvet Bathe MD.  [facility] 12/29/2016, 4:16 PM

## 2016-12-29 NOTE — Progress Notes (Signed)
Physical Therapy Treatment Patient Details Name: Makayla Rodriguez MRN: [id number] DOB: Nov 06, 1931 Today's Date: 12/29/2016    History of Present Illness 83 year old female with PMH significant for CHF, Anxiety, CKD III, DM, GERD, OHS on home O2,and OSA on CPAP. She presented to [facility] 7/24 with SOB and peripheral edema. She was admitted to the hospitalist service for respiratory failure secondary to CHF exacerbation. worsening resp distress and transferred to ICU and intubated 7/24, extubated 7/25    PT Comments    Pt limited today by R LE pain in foot and knee, awaiting imaging to r/o fx before resume weightbearing activities. Pt maxAx2 for bed mobility. Pt able to sit EoB for 20 minutes while performing exercises. Pt requires skilled PT to progress her trasnfers to aid in her self care and mobility in her home environment.     Follow Up Recommendations  Home health PT;Supervision/Assistance - 24 hour (prn +2 assist)     Equipment Recommendations  Other (comment);3in1 (PT) (they are requesting a larger transport chair; bariatric 3in1)    Recommendations for Other Services       Precautions / Restrictions Precautions Precautions: Fall Restrictions Weight Bearing Restrictions: No    Mobility  Bed Mobility Overal bed mobility: Needs Assistance Bed Mobility: Supine to Sit;Sit to Supine     Supine to sit: Max assist;+2 for physical assistance;HOB elevated Sit to supine: Max assist;+2 for physical assistance   General bed mobility comments: assist to bring bilat LE to EOB, R LE needed more assist due to pain, elevate trunk into sitting, and scoot hips toward EOB with use of bed pad; cues for sequencing  Transfers                 General transfer comment: transfer postponed to next session after r/o fx     Balance Overall balance assessment: Needs assistance Sitting-balance support: Feet supported;No upper extremity supported Sitting balance-Leahy Scale: Good                                      Cognition Arousal/Alertness: Awake/alert Behavior During Therapy: Anxious Overall Cognitive Status:  (anxiety vs cognition; likely baseline; reduce distractions)                                 General Comments: less anxious to sit EoB with only sister present      Exercises General Exercises - Upper Extremity Shoulder Flexion: AAROM;Both;10 reps;Seated Other Exercises Other Exercises: scapular retraction in sitting x5 Other Exercises: spinal extension to neutral x5 Other Exercises: forward reach outside BoS, bilateral arms x5    General Comments General comments (skin integrity, edema, etc.): SaO2 on 4 L O2 >90%O2 at rest, dropped to 87%O2 with movement but quickly rebounded to >90%O2 with vc for deep breathing through her nose      Pertinent Vitals/Pain Pain Assessment: 0-10 Faces Pain Scale: Hurts even more Pain Location: bilateral LE, R knee and toes greater pain Pain Descriptors / Indicators: Grimacing;Sore Pain Intervention(s): Limited activity within patient's tolerance;Monitored during session;RN gave pain meds during session;Other (comment) (nursing request xray of R knee and foot to r/o fx)           PT Goals (current goals can now be found in the care plan section) Acute Rehab PT Goals PT Goal Formulation: With patient Time For Goal Achievement:  01/07/17 Potential to Achieve Goals: Good Progress towards PT goals: Progressing toward goals    Frequency    Min 3X/week      PT Plan Current plan remains appropriate    Co-evaluation PT/OT/SLP Co-Evaluation/Treatment: Yes Reason for Co-Treatment: Complexity of the patient's impairments (multi-system involvement)          AM-PAC PT "6 Clicks" Daily Activity  Outcome Measure  Difficulty turning over in bed (including adjusting bedclothes, sheets and blankets)?: A Lot Difficulty moving from lying on back to sitting on the side of the bed? :  Total Difficulty sitting down on and standing up from a chair with arms (e.g., wheelchair, bedside commode, etc,.)?: Total Help needed moving to and from a bed to chair (including a wheelchair)?: A Lot Help needed walking in hospital room?: A Lot Help needed climbing 3-5 steps with a railing? : Total 6 Click Score: 9    End of Session Equipment Utilized During Treatment: Gait belt;Oxygen Activity Tolerance: Patient tolerated treatment well Patient left: with call bell/phone within reach;with family/visitor present;Other (comment) (sitting on [facility] with caregiver present & nursing staff aware) Nurse Communication: Mobility status PT Visit Diagnosis: Other abnormalities of gait and mobility (R26.89);Muscle weakness (generalized) (M62.81)     Time: 8366-2947 PT Time Calculation (min) (ACUTE ONLY): 37 min  Charges:  $Therapeutic Exercise: 8-22 mins                    G Codes:       Makayla Rodriguez B. Migdalia Dk PT, DPT Acute Rehabilitation  [phone] Pager [phone]   [place] 12/29/2016, 12:28 PM

## 2016-12-29 NOTE — Consult Note (Signed)
           [facility] CM Primary Care Navigator  12/29/2016  Makayla Rodriguez [birth year] [id number]   Went to see patient today at the bedside to identify possible discharge needs but she is presently being prepared for transfer to [facility] per RN report.  Patient is an 83 year old female with history of HTN, HLD, DM, OSA, pleural mass followed by pulmonary as out patient, remote breast cancer, chronic respiratory failure on 2 L O2 and on 3 L at night, admitted due to increased shortness of breath. She is followed up by Dr. Darcus Rodriguez with  [facility] as her primary care provider. Patient's primary care provider's office does transition of care follow-up.   For questions, please contact:  Dannielle Huh, BSN, RN- [facility] Primary Care Navigator  Telephone: [phone] [place]

## 2016-12-29 NOTE — Progress Notes (Signed)
Transferring to [facility]  Long term Acute Care room 5E10; report called to extension [phone], received by Anthony Sar, RN. Patient is aware of transfer; Sitter at bedside. Patient will be transferred at end of shift due to high patient acuity and low transportation.

## 2016-12-30 ENCOUNTER — Other Ambulatory Visit (HOSPITAL_COMMUNITY): Payer: Medicare Other

## 2016-12-30 DIAGNOSIS — M19071 Primary osteoarthritis, right ankle and foot: Secondary | ICD-10-CM | POA: Diagnosis not present

## 2016-12-30 LAB — CBC WITH DIFFERENTIAL/PLATELET
Basophils Absolute: 0 10*3/uL (ref 0.0–0.1)
Basophils Relative: 1 %
EOS ABS: 0.3 10*3/uL (ref 0.0–0.7)
EOS PCT: 4 %
HCT: 37.6 % (ref 36.0–46.0)
Hemoglobin: 11.6 g/dL — ABNORMAL LOW (ref 12.0–15.0)
LYMPHS ABS: 1.7 10*3/uL (ref 0.7–4.0)
LYMPHS PCT: 24 %
MCH: 29.4 pg (ref 26.0–34.0)
MCHC: 30.9 g/dL (ref 30.0–36.0)
MCV: 95.4 fL (ref 78.0–100.0)
MONO ABS: 0.8 10*3/uL (ref 0.1–1.0)
MONOS PCT: 12 %
Neutro Abs: 4.3 10*3/uL (ref 1.7–7.7)
Neutrophils Relative %: 61 %
PLATELETS: 241 10*3/uL (ref 150–400)
RBC: 3.94 MIL/uL (ref 3.87–5.11)
RDW: 12.2 % (ref 11.5–15.5)
WBC: 7 10*3/uL (ref 4.0–10.5)

## 2016-12-30 LAB — PROTIME-INR
INR: 1.02
PROTHROMBIN TIME: 13.4 s (ref 11.4–15.2)

## 2016-12-30 LAB — COMPREHENSIVE METABOLIC PANEL
ALBUMIN: 3 g/dL — AB (ref 3.5–5.0)
ALT: 12 U/L — AB (ref 14–54)
AST: 14 U/L — AB (ref 15–41)
Alkaline Phosphatase: 38 U/L (ref 38–126)
Anion gap: 13 (ref 5–15)
BUN: 25 mg/dL — AB (ref 6–20)
CHLORIDE: 90 mmol/L — AB (ref 101–111)
CO2: 41 mmol/L — AB (ref 22–32)
CREATININE: 0.9 mg/dL (ref 0.44–1.00)
Calcium: 9.7 mg/dL (ref 8.9–10.3)
GFR calc Af Amer: >60 mL/min (ref >60)
GFR, EST NON AFRICAN AMERICAN: 58 mL/min — AB (ref >60)
GLUCOSE: 244 mg/dL — AB (ref 65–99)
POTASSIUM: 4.2 mmol/L (ref 3.5–5.1)
Sodium: 144 mmol/L (ref 135–145)
Total Bilirubin: 1 mg/dL (ref 0.3–1.2)
Total Protein: 6.8 g/dL (ref 6.5–8.1)

## 2016-12-31 ENCOUNTER — Other Ambulatory Visit (HOSPITAL_COMMUNITY): Payer: Medicare Other

## 2016-12-31 DIAGNOSIS — J9600 Acute respiratory failure, unspecified whether with hypoxia or hypercapnia: Secondary | ICD-10-CM | POA: Diagnosis not present

## 2016-12-31 LAB — BLOOD GAS, ARTERIAL
Acid-Base Excess: 19.6 mmol/L — ABNORMAL HIGH (ref 0.0–2.0)
BICARBONATE: 45.2 mmol/L — AB (ref 20.0–28.0)
O2 CONTENT: 3 L/min
O2 SAT: 93.6 %
PATIENT TEMPERATURE: 98.6
PH ART: 7.453 — AB (ref 7.350–7.450)
PO2 ART: 68.3 mmHg — AB (ref 83.0–108.0)
pCO2 arterial: 65.6 mmHg (ref 32.0–48.0)

## 2017-01-03 LAB — BASIC METABOLIC PANEL
ANION GAP: 11 (ref 5–15)
BUN: 47 mg/dL — ABNORMAL HIGH (ref 6–20)
CALCIUM: 9.5 mg/dL (ref 8.9–10.3)
CO2: 41 mmol/L — ABNORMAL HIGH (ref 22–32)
Chloride: 90 mmol/L — ABNORMAL LOW (ref 101–111)
Creatinine, Ser: 1.4 mg/dL — ABNORMAL HIGH (ref 0.44–1.00)
GFR, EST AFRICAN AMERICAN: 39 mL/min — AB (ref >60)
GFR, EST NON AFRICAN AMERICAN: 34 mL/min — AB (ref >60)
GLUCOSE: 185 mg/dL — AB (ref 65–99)
POTASSIUM: 3.9 mmol/L (ref 3.5–5.1)
Sodium: 142 mmol/L (ref 135–145)

## 2017-01-03 LAB — CBC WITH DIFFERENTIAL/PLATELET
BASOS ABS: 0 10*3/uL (ref 0.0–0.1)
BASOS PCT: 0 %
Eosinophils Absolute: 0.3 10*3/uL (ref 0.0–0.7)
Eosinophils Relative: 4 %
HEMATOCRIT: 37 % (ref 36.0–46.0)
HEMOGLOBIN: 11.3 g/dL — AB (ref 12.0–15.0)
LYMPHS PCT: 29 %
Lymphs Abs: 2.4 10*3/uL (ref 0.7–4.0)
MCH: 28.7 pg (ref 26.0–34.0)
MCHC: 30.5 g/dL (ref 30.0–36.0)
MCV: 93.9 fL (ref 78.0–100.0)
MONO ABS: 0.6 10*3/uL (ref 0.1–1.0)
Monocytes Relative: 7 %
NEUTROS ABS: 4.8 10*3/uL (ref 1.7–7.7)
NEUTROS PCT: 60 %
Platelets: 249 10*3/uL (ref 150–400)
RBC: 3.94 MIL/uL (ref 3.87–5.11)
RDW: 12.7 % (ref 11.5–15.5)
WBC: 8.4 10*3/uL (ref 4.0–10.5)

## 2017-01-11 LAB — CBC
HCT: 33.5 % — ABNORMAL LOW (ref 36.0–46.0)
HEMOGLOBIN: 10.8 g/dL — AB (ref 12.0–15.0)
MCH: 29.3 pg (ref 26.0–34.0)
MCHC: 32.2 g/dL (ref 30.0–36.0)
MCV: 91 fL (ref 78.0–100.0)
PLATELETS: 257 10*3/uL (ref 150–400)
RBC: 3.68 MIL/uL — ABNORMAL LOW (ref 3.87–5.11)
RDW: 12.8 % (ref 11.5–15.5)
WBC: 8.2 10*3/uL (ref 4.0–10.5)

## 2017-01-11 LAB — BASIC METABOLIC PANEL
Anion gap: 10 (ref 5–15)
BUN: 51 mg/dL — AB (ref 6–20)
CALCIUM: 9.6 mg/dL (ref 8.9–10.3)
CHLORIDE: 91 mmol/L — AB (ref 101–111)
CO2: 37 mmol/L — ABNORMAL HIGH (ref 22–32)
CREATININE: 1.35 mg/dL — AB (ref 0.44–1.00)
GFR, EST AFRICAN AMERICAN: 41 mL/min — AB (ref >60)
GFR, EST NON AFRICAN AMERICAN: 35 mL/min — AB (ref >60)
Glucose, Bld: 157 mg/dL — ABNORMAL HIGH (ref 65–99)
Potassium: 3.9 mmol/L (ref 3.5–5.1)
SODIUM: 138 mmol/L (ref 135–145)

## 2017-01-19 DIAGNOSIS — E1121 Type 2 diabetes mellitus with diabetic nephropathy: Secondary | ICD-10-CM | POA: Diagnosis not present

## 2017-01-19 DIAGNOSIS — E1165 Type 2 diabetes mellitus with hyperglycemia: Secondary | ICD-10-CM | POA: Diagnosis not present

## 2017-01-19 DIAGNOSIS — I10 Essential (primary) hypertension: Secondary | ICD-10-CM | POA: Diagnosis not present

## 2017-01-19 DIAGNOSIS — Z794 Long term (current) use of insulin: Secondary | ICD-10-CM | POA: Diagnosis not present

## 2017-01-19 DIAGNOSIS — M179 Osteoarthritis of knee, unspecified: Secondary | ICD-10-CM | POA: Diagnosis not present

## 2017-01-19 DIAGNOSIS — I5030 Unspecified diastolic (congestive) heart failure: Secondary | ICD-10-CM | POA: Diagnosis not present

## 2017-01-19 DIAGNOSIS — J449 Chronic obstructive pulmonary disease, unspecified: Secondary | ICD-10-CM | POA: Diagnosis not present

## 2017-01-19 DIAGNOSIS — N183 Chronic kidney disease, stage 3 (moderate): Secondary | ICD-10-CM | POA: Diagnosis not present

## 2017-01-21 DIAGNOSIS — E1122 Type 2 diabetes mellitus with diabetic chronic kidney disease: Secondary | ICD-10-CM | POA: Diagnosis not present

## 2017-01-21 DIAGNOSIS — N183 Chronic kidney disease, stage 3 (moderate): Secondary | ICD-10-CM | POA: Diagnosis not present

## 2017-01-21 DIAGNOSIS — D631 Anemia in chronic kidney disease: Secondary | ICD-10-CM | POA: Diagnosis not present

## 2017-01-21 DIAGNOSIS — I130 Hypertensive heart and chronic kidney disease with heart failure and stage 1 through stage 4 chronic kidney disease, or unspecified chronic kidney disease: Secondary | ICD-10-CM | POA: Diagnosis not present

## 2017-01-21 DIAGNOSIS — E662 Morbid (severe) obesity with alveolar hypoventilation: Secondary | ICD-10-CM | POA: Diagnosis not present

## 2017-01-21 DIAGNOSIS — I5033 Acute on chronic diastolic (congestive) heart failure: Secondary | ICD-10-CM | POA: Diagnosis not present

## 2017-01-23 DIAGNOSIS — E1122 Type 2 diabetes mellitus with diabetic chronic kidney disease: Secondary | ICD-10-CM | POA: Diagnosis not present

## 2017-01-23 DIAGNOSIS — I130 Hypertensive heart and chronic kidney disease with heart failure and stage 1 through stage 4 chronic kidney disease, or unspecified chronic kidney disease: Secondary | ICD-10-CM | POA: Diagnosis not present

## 2017-01-23 DIAGNOSIS — E662 Morbid (severe) obesity with alveolar hypoventilation: Secondary | ICD-10-CM | POA: Diagnosis not present

## 2017-01-23 DIAGNOSIS — D631 Anemia in chronic kidney disease: Secondary | ICD-10-CM | POA: Diagnosis not present

## 2017-01-23 DIAGNOSIS — I5033 Acute on chronic diastolic (congestive) heart failure: Secondary | ICD-10-CM | POA: Diagnosis not present

## 2017-01-23 DIAGNOSIS — N183 Chronic kidney disease, stage 3 (moderate): Secondary | ICD-10-CM | POA: Diagnosis not present

## 2017-01-24 ENCOUNTER — Ambulatory Visit: Payer: Medicare Other | Admitting: Interventional Cardiology

## 2017-01-24 DIAGNOSIS — I130 Hypertensive heart and chronic kidney disease with heart failure and stage 1 through stage 4 chronic kidney disease, or unspecified chronic kidney disease: Secondary | ICD-10-CM | POA: Diagnosis not present

## 2017-01-24 DIAGNOSIS — I5033 Acute on chronic diastolic (congestive) heart failure: Secondary | ICD-10-CM | POA: Diagnosis not present

## 2017-01-24 DIAGNOSIS — E1122 Type 2 diabetes mellitus with diabetic chronic kidney disease: Secondary | ICD-10-CM | POA: Diagnosis not present

## 2017-01-24 DIAGNOSIS — D631 Anemia in chronic kidney disease: Secondary | ICD-10-CM | POA: Diagnosis not present

## 2017-01-24 DIAGNOSIS — E662 Morbid (severe) obesity with alveolar hypoventilation: Secondary | ICD-10-CM | POA: Diagnosis not present

## 2017-01-24 DIAGNOSIS — N183 Chronic kidney disease, stage 3 (moderate): Secondary | ICD-10-CM | POA: Diagnosis not present

## 2017-01-26 DIAGNOSIS — I5033 Acute on chronic diastolic (congestive) heart failure: Secondary | ICD-10-CM | POA: Diagnosis not present

## 2017-01-26 DIAGNOSIS — I130 Hypertensive heart and chronic kidney disease with heart failure and stage 1 through stage 4 chronic kidney disease, or unspecified chronic kidney disease: Secondary | ICD-10-CM | POA: Diagnosis not present

## 2017-01-26 DIAGNOSIS — E1122 Type 2 diabetes mellitus with diabetic chronic kidney disease: Secondary | ICD-10-CM | POA: Diagnosis not present

## 2017-01-26 DIAGNOSIS — E662 Morbid (severe) obesity with alveolar hypoventilation: Secondary | ICD-10-CM | POA: Diagnosis not present

## 2017-01-26 DIAGNOSIS — N183 Chronic kidney disease, stage 3 (moderate): Secondary | ICD-10-CM | POA: Diagnosis not present

## 2017-01-26 DIAGNOSIS — D631 Anemia in chronic kidney disease: Secondary | ICD-10-CM | POA: Diagnosis not present

## 2017-01-27 DIAGNOSIS — E1122 Type 2 diabetes mellitus with diabetic chronic kidney disease: Secondary | ICD-10-CM | POA: Diagnosis not present

## 2017-01-27 DIAGNOSIS — I130 Hypertensive heart and chronic kidney disease with heart failure and stage 1 through stage 4 chronic kidney disease, or unspecified chronic kidney disease: Secondary | ICD-10-CM | POA: Diagnosis not present

## 2017-01-27 DIAGNOSIS — N183 Chronic kidney disease, stage 3 (moderate): Secondary | ICD-10-CM | POA: Diagnosis not present

## 2017-01-27 DIAGNOSIS — I5033 Acute on chronic diastolic (congestive) heart failure: Secondary | ICD-10-CM | POA: Diagnosis not present

## 2017-01-27 DIAGNOSIS — E662 Morbid (severe) obesity with alveolar hypoventilation: Secondary | ICD-10-CM | POA: Diagnosis not present

## 2017-01-27 DIAGNOSIS — D631 Anemia in chronic kidney disease: Secondary | ICD-10-CM | POA: Diagnosis not present

## 2017-01-30 DIAGNOSIS — E1122 Type 2 diabetes mellitus with diabetic chronic kidney disease: Secondary | ICD-10-CM | POA: Diagnosis not present

## 2017-01-30 DIAGNOSIS — E662 Morbid (severe) obesity with alveolar hypoventilation: Secondary | ICD-10-CM | POA: Diagnosis not present

## 2017-01-30 DIAGNOSIS — N183 Chronic kidney disease, stage 3 (moderate): Secondary | ICD-10-CM | POA: Diagnosis not present

## 2017-01-30 DIAGNOSIS — I130 Hypertensive heart and chronic kidney disease with heart failure and stage 1 through stage 4 chronic kidney disease, or unspecified chronic kidney disease: Secondary | ICD-10-CM | POA: Diagnosis not present

## 2017-01-30 DIAGNOSIS — I5033 Acute on chronic diastolic (congestive) heart failure: Secondary | ICD-10-CM | POA: Diagnosis not present

## 2017-01-30 DIAGNOSIS — D631 Anemia in chronic kidney disease: Secondary | ICD-10-CM | POA: Diagnosis not present

## 2017-01-31 DIAGNOSIS — E1122 Type 2 diabetes mellitus with diabetic chronic kidney disease: Secondary | ICD-10-CM | POA: Diagnosis not present

## 2017-01-31 DIAGNOSIS — E662 Morbid (severe) obesity with alveolar hypoventilation: Secondary | ICD-10-CM | POA: Diagnosis not present

## 2017-01-31 DIAGNOSIS — I130 Hypertensive heart and chronic kidney disease with heart failure and stage 1 through stage 4 chronic kidney disease, or unspecified chronic kidney disease: Secondary | ICD-10-CM | POA: Diagnosis not present

## 2017-01-31 DIAGNOSIS — I5033 Acute on chronic diastolic (congestive) heart failure: Secondary | ICD-10-CM | POA: Diagnosis not present

## 2017-01-31 DIAGNOSIS — D631 Anemia in chronic kidney disease: Secondary | ICD-10-CM | POA: Diagnosis not present

## 2017-01-31 DIAGNOSIS — N183 Chronic kidney disease, stage 3 (moderate): Secondary | ICD-10-CM | POA: Diagnosis not present

## 2017-02-01 ENCOUNTER — Inpatient Hospital Stay: Payer: Medicare Other | Admitting: Pulmonary Disease

## 2017-02-01 DIAGNOSIS — I130 Hypertensive heart and chronic kidney disease with heart failure and stage 1 through stage 4 chronic kidney disease, or unspecified chronic kidney disease: Secondary | ICD-10-CM | POA: Diagnosis not present

## 2017-02-01 DIAGNOSIS — D631 Anemia in chronic kidney disease: Secondary | ICD-10-CM | POA: Diagnosis not present

## 2017-02-01 DIAGNOSIS — N183 Chronic kidney disease, stage 3 (moderate): Secondary | ICD-10-CM | POA: Diagnosis not present

## 2017-02-01 DIAGNOSIS — I5033 Acute on chronic diastolic (congestive) heart failure: Secondary | ICD-10-CM | POA: Diagnosis not present

## 2017-02-01 DIAGNOSIS — E1122 Type 2 diabetes mellitus with diabetic chronic kidney disease: Secondary | ICD-10-CM | POA: Diagnosis not present

## 2017-02-01 DIAGNOSIS — E662 Morbid (severe) obesity with alveolar hypoventilation: Secondary | ICD-10-CM | POA: Diagnosis not present

## 2017-02-02 DIAGNOSIS — D631 Anemia in chronic kidney disease: Secondary | ICD-10-CM | POA: Diagnosis not present

## 2017-02-02 DIAGNOSIS — I5033 Acute on chronic diastolic (congestive) heart failure: Secondary | ICD-10-CM | POA: Diagnosis not present

## 2017-02-02 DIAGNOSIS — E1165 Type 2 diabetes mellitus with hyperglycemia: Secondary | ICD-10-CM | POA: Diagnosis not present

## 2017-02-02 DIAGNOSIS — I890 Lymphedema, not elsewhere classified: Secondary | ICD-10-CM | POA: Diagnosis not present

## 2017-02-02 DIAGNOSIS — J9612 Chronic respiratory failure with hypercapnia: Secondary | ICD-10-CM | POA: Diagnosis not present

## 2017-02-02 DIAGNOSIS — E662 Morbid (severe) obesity with alveolar hypoventilation: Secondary | ICD-10-CM | POA: Diagnosis not present

## 2017-02-02 DIAGNOSIS — I130 Hypertensive heart and chronic kidney disease with heart failure and stage 1 through stage 4 chronic kidney disease, or unspecified chronic kidney disease: Secondary | ICD-10-CM | POA: Diagnosis not present

## 2017-02-02 DIAGNOSIS — Z794 Long term (current) use of insulin: Secondary | ICD-10-CM | POA: Diagnosis not present

## 2017-02-02 DIAGNOSIS — E1122 Type 2 diabetes mellitus with diabetic chronic kidney disease: Secondary | ICD-10-CM | POA: Diagnosis not present

## 2017-02-02 DIAGNOSIS — N183 Chronic kidney disease, stage 3 (moderate): Secondary | ICD-10-CM | POA: Diagnosis not present

## 2017-02-02 DIAGNOSIS — M79671 Pain in right foot: Secondary | ICD-10-CM | POA: Diagnosis not present

## 2017-02-02 DIAGNOSIS — I10 Essential (primary) hypertension: Secondary | ICD-10-CM | POA: Diagnosis not present

## 2017-02-03 DIAGNOSIS — I130 Hypertensive heart and chronic kidney disease with heart failure and stage 1 through stage 4 chronic kidney disease, or unspecified chronic kidney disease: Secondary | ICD-10-CM | POA: Diagnosis not present

## 2017-02-03 DIAGNOSIS — E1122 Type 2 diabetes mellitus with diabetic chronic kidney disease: Secondary | ICD-10-CM | POA: Diagnosis not present

## 2017-02-03 DIAGNOSIS — E662 Morbid (severe) obesity with alveolar hypoventilation: Secondary | ICD-10-CM | POA: Diagnosis not present

## 2017-02-03 DIAGNOSIS — N183 Chronic kidney disease, stage 3 (moderate): Secondary | ICD-10-CM | POA: Diagnosis not present

## 2017-02-03 DIAGNOSIS — I5033 Acute on chronic diastolic (congestive) heart failure: Secondary | ICD-10-CM | POA: Diagnosis not present

## 2017-02-03 DIAGNOSIS — D631 Anemia in chronic kidney disease: Secondary | ICD-10-CM | POA: Diagnosis not present

## 2017-02-06 DIAGNOSIS — I5033 Acute on chronic diastolic (congestive) heart failure: Secondary | ICD-10-CM | POA: Diagnosis not present

## 2017-02-06 DIAGNOSIS — N183 Chronic kidney disease, stage 3 (moderate): Secondary | ICD-10-CM | POA: Diagnosis not present

## 2017-02-06 DIAGNOSIS — I130 Hypertensive heart and chronic kidney disease with heart failure and stage 1 through stage 4 chronic kidney disease, or unspecified chronic kidney disease: Secondary | ICD-10-CM | POA: Diagnosis not present

## 2017-02-06 DIAGNOSIS — E662 Morbid (severe) obesity with alveolar hypoventilation: Secondary | ICD-10-CM | POA: Diagnosis not present

## 2017-02-06 DIAGNOSIS — E1122 Type 2 diabetes mellitus with diabetic chronic kidney disease: Secondary | ICD-10-CM | POA: Diagnosis not present

## 2017-02-06 DIAGNOSIS — D631 Anemia in chronic kidney disease: Secondary | ICD-10-CM | POA: Diagnosis not present

## 2017-02-07 DIAGNOSIS — I130 Hypertensive heart and chronic kidney disease with heart failure and stage 1 through stage 4 chronic kidney disease, or unspecified chronic kidney disease: Secondary | ICD-10-CM | POA: Diagnosis not present

## 2017-02-07 DIAGNOSIS — D631 Anemia in chronic kidney disease: Secondary | ICD-10-CM | POA: Diagnosis not present

## 2017-02-07 DIAGNOSIS — E662 Morbid (severe) obesity with alveolar hypoventilation: Secondary | ICD-10-CM | POA: Diagnosis not present

## 2017-02-07 DIAGNOSIS — I5033 Acute on chronic diastolic (congestive) heart failure: Secondary | ICD-10-CM | POA: Diagnosis not present

## 2017-02-07 DIAGNOSIS — E1122 Type 2 diabetes mellitus with diabetic chronic kidney disease: Secondary | ICD-10-CM | POA: Diagnosis not present

## 2017-02-07 DIAGNOSIS — N183 Chronic kidney disease, stage 3 (moderate): Secondary | ICD-10-CM | POA: Diagnosis not present

## 2017-02-08 DIAGNOSIS — N183 Chronic kidney disease, stage 3 (moderate): Secondary | ICD-10-CM | POA: Diagnosis not present

## 2017-02-08 DIAGNOSIS — I130 Hypertensive heart and chronic kidney disease with heart failure and stage 1 through stage 4 chronic kidney disease, or unspecified chronic kidney disease: Secondary | ICD-10-CM | POA: Diagnosis not present

## 2017-02-08 DIAGNOSIS — I5033 Acute on chronic diastolic (congestive) heart failure: Secondary | ICD-10-CM | POA: Diagnosis not present

## 2017-02-08 DIAGNOSIS — D631 Anemia in chronic kidney disease: Secondary | ICD-10-CM | POA: Diagnosis not present

## 2017-02-08 DIAGNOSIS — E1122 Type 2 diabetes mellitus with diabetic chronic kidney disease: Secondary | ICD-10-CM | POA: Diagnosis not present

## 2017-02-08 DIAGNOSIS — E662 Morbid (severe) obesity with alveolar hypoventilation: Secondary | ICD-10-CM | POA: Diagnosis not present

## 2017-02-10 DIAGNOSIS — E1122 Type 2 diabetes mellitus with diabetic chronic kidney disease: Secondary | ICD-10-CM | POA: Diagnosis not present

## 2017-02-10 DIAGNOSIS — I130 Hypertensive heart and chronic kidney disease with heart failure and stage 1 through stage 4 chronic kidney disease, or unspecified chronic kidney disease: Secondary | ICD-10-CM | POA: Diagnosis not present

## 2017-02-10 DIAGNOSIS — E662 Morbid (severe) obesity with alveolar hypoventilation: Secondary | ICD-10-CM | POA: Diagnosis not present

## 2017-02-10 DIAGNOSIS — D631 Anemia in chronic kidney disease: Secondary | ICD-10-CM | POA: Diagnosis not present

## 2017-02-10 DIAGNOSIS — I5033 Acute on chronic diastolic (congestive) heart failure: Secondary | ICD-10-CM | POA: Diagnosis not present

## 2017-02-10 DIAGNOSIS — N183 Chronic kidney disease, stage 3 (moderate): Secondary | ICD-10-CM | POA: Diagnosis not present

## 2017-02-13 DIAGNOSIS — N183 Chronic kidney disease, stage 3 (moderate): Secondary | ICD-10-CM | POA: Diagnosis not present

## 2017-02-13 DIAGNOSIS — D631 Anemia in chronic kidney disease: Secondary | ICD-10-CM | POA: Diagnosis not present

## 2017-02-13 DIAGNOSIS — E1122 Type 2 diabetes mellitus with diabetic chronic kidney disease: Secondary | ICD-10-CM | POA: Diagnosis not present

## 2017-02-13 DIAGNOSIS — E662 Morbid (severe) obesity with alveolar hypoventilation: Secondary | ICD-10-CM | POA: Diagnosis not present

## 2017-02-13 DIAGNOSIS — I130 Hypertensive heart and chronic kidney disease with heart failure and stage 1 through stage 4 chronic kidney disease, or unspecified chronic kidney disease: Secondary | ICD-10-CM | POA: Diagnosis not present

## 2017-02-13 DIAGNOSIS — I5033 Acute on chronic diastolic (congestive) heart failure: Secondary | ICD-10-CM | POA: Diagnosis not present

## 2017-02-14 DIAGNOSIS — N183 Chronic kidney disease, stage 3 (moderate): Secondary | ICD-10-CM | POA: Diagnosis not present

## 2017-02-14 DIAGNOSIS — E662 Morbid (severe) obesity with alveolar hypoventilation: Secondary | ICD-10-CM | POA: Diagnosis not present

## 2017-02-14 DIAGNOSIS — E1122 Type 2 diabetes mellitus with diabetic chronic kidney disease: Secondary | ICD-10-CM | POA: Diagnosis not present

## 2017-02-14 DIAGNOSIS — I130 Hypertensive heart and chronic kidney disease with heart failure and stage 1 through stage 4 chronic kidney disease, or unspecified chronic kidney disease: Secondary | ICD-10-CM | POA: Diagnosis not present

## 2017-02-14 DIAGNOSIS — I5033 Acute on chronic diastolic (congestive) heart failure: Secondary | ICD-10-CM | POA: Diagnosis not present

## 2017-02-14 DIAGNOSIS — D631 Anemia in chronic kidney disease: Secondary | ICD-10-CM | POA: Diagnosis not present

## 2017-02-15 DIAGNOSIS — I130 Hypertensive heart and chronic kidney disease with heart failure and stage 1 through stage 4 chronic kidney disease, or unspecified chronic kidney disease: Secondary | ICD-10-CM | POA: Diagnosis not present

## 2017-02-15 DIAGNOSIS — I5033 Acute on chronic diastolic (congestive) heart failure: Secondary | ICD-10-CM | POA: Diagnosis not present

## 2017-02-15 DIAGNOSIS — E662 Morbid (severe) obesity with alveolar hypoventilation: Secondary | ICD-10-CM | POA: Diagnosis not present

## 2017-02-15 DIAGNOSIS — E1122 Type 2 diabetes mellitus with diabetic chronic kidney disease: Secondary | ICD-10-CM | POA: Diagnosis not present

## 2017-02-15 DIAGNOSIS — N183 Chronic kidney disease, stage 3 (moderate): Secondary | ICD-10-CM | POA: Diagnosis not present

## 2017-02-15 DIAGNOSIS — D631 Anemia in chronic kidney disease: Secondary | ICD-10-CM | POA: Diagnosis not present

## 2017-02-17 DIAGNOSIS — E662 Morbid (severe) obesity with alveolar hypoventilation: Secondary | ICD-10-CM | POA: Diagnosis not present

## 2017-02-17 DIAGNOSIS — D631 Anemia in chronic kidney disease: Secondary | ICD-10-CM | POA: Diagnosis not present

## 2017-02-17 DIAGNOSIS — N183 Chronic kidney disease, stage 3 (moderate): Secondary | ICD-10-CM | POA: Diagnosis not present

## 2017-02-17 DIAGNOSIS — I130 Hypertensive heart and chronic kidney disease with heart failure and stage 1 through stage 4 chronic kidney disease, or unspecified chronic kidney disease: Secondary | ICD-10-CM | POA: Diagnosis not present

## 2017-02-17 DIAGNOSIS — E1122 Type 2 diabetes mellitus with diabetic chronic kidney disease: Secondary | ICD-10-CM | POA: Diagnosis not present

## 2017-02-17 DIAGNOSIS — I5033 Acute on chronic diastolic (congestive) heart failure: Secondary | ICD-10-CM | POA: Diagnosis not present

## 2017-02-21 DIAGNOSIS — N183 Chronic kidney disease, stage 3 (moderate): Secondary | ICD-10-CM | POA: Diagnosis not present

## 2017-02-21 DIAGNOSIS — E662 Morbid (severe) obesity with alveolar hypoventilation: Secondary | ICD-10-CM | POA: Diagnosis not present

## 2017-02-21 DIAGNOSIS — I130 Hypertensive heart and chronic kidney disease with heart failure and stage 1 through stage 4 chronic kidney disease, or unspecified chronic kidney disease: Secondary | ICD-10-CM | POA: Diagnosis not present

## 2017-02-21 DIAGNOSIS — D631 Anemia in chronic kidney disease: Secondary | ICD-10-CM | POA: Diagnosis not present

## 2017-02-21 DIAGNOSIS — I5033 Acute on chronic diastolic (congestive) heart failure: Secondary | ICD-10-CM | POA: Diagnosis not present

## 2017-02-21 DIAGNOSIS — E1122 Type 2 diabetes mellitus with diabetic chronic kidney disease: Secondary | ICD-10-CM | POA: Diagnosis not present

## 2017-02-22 ENCOUNTER — Ambulatory Visit (INDEPENDENT_AMBULATORY_CARE_PROVIDER_SITE_OTHER): Payer: Medicare Other | Admitting: Pulmonary Disease

## 2017-02-22 ENCOUNTER — Telehealth: Payer: Self-pay | Admitting: Pulmonary Disease

## 2017-02-22 ENCOUNTER — Encounter: Payer: Self-pay | Admitting: Pulmonary Disease

## 2017-02-22 VITALS — BP 112/78 | HR 78

## 2017-02-22 DIAGNOSIS — I130 Hypertensive heart and chronic kidney disease with heart failure and stage 1 through stage 4 chronic kidney disease, or unspecified chronic kidney disease: Secondary | ICD-10-CM | POA: Diagnosis not present

## 2017-02-22 DIAGNOSIS — G4733 Obstructive sleep apnea (adult) (pediatric): Secondary | ICD-10-CM | POA: Diagnosis not present

## 2017-02-22 DIAGNOSIS — J9611 Chronic respiratory failure with hypoxia: Secondary | ICD-10-CM

## 2017-02-22 DIAGNOSIS — Z9989 Dependence on other enabling machines and devices: Principal | ICD-10-CM

## 2017-02-22 DIAGNOSIS — I5033 Acute on chronic diastolic (congestive) heart failure: Secondary | ICD-10-CM | POA: Diagnosis not present

## 2017-02-22 DIAGNOSIS — N183 Chronic kidney disease, stage 3 (moderate): Secondary | ICD-10-CM | POA: Diagnosis not present

## 2017-02-22 DIAGNOSIS — E662 Morbid (severe) obesity with alveolar hypoventilation: Secondary | ICD-10-CM | POA: Diagnosis not present

## 2017-02-22 DIAGNOSIS — D631 Anemia in chronic kidney disease: Secondary | ICD-10-CM | POA: Diagnosis not present

## 2017-02-22 DIAGNOSIS — E1122 Type 2 diabetes mellitus with diabetic chronic kidney disease: Secondary | ICD-10-CM | POA: Diagnosis not present

## 2017-02-22 NOTE — Telephone Encounter (Signed)
Please obtain download of CPAP.  Also arrange for ONO on 4L O2.  The patient should have O2 bled into CPAP.    Call if you have questions.    Noe Gens, NP-C [facility] Pgr: [phone] or if no answer [phone] 02/22/2017, 3:04 PM

## 2017-02-22 NOTE — Progress Notes (Addendum)
[facility]    Chief Complaint  Patient presents with  . Hospitalization Follow-up    breathing is not as good without being on her bipap, she is having trouble with mask, heart failure for ED visit and respiratory failure     Current Outpatient Prescriptions on File Prior to Visit  Medication Sig  . acetaminophen (TYLENOL) 500 MG tablet Take 500 mg by mouth every 6 (six) hours as needed for mild pain.   Marland Kitchen albuterol (PROAIR HFA) 108 (90 Base) MCG/ACT inhaler Inhale 2 puffs into the lungs every 6 (six) hours as needed for wheezing or shortness of breath.  Marland Kitchen aspirin EC 81 MG tablet Take 81 mg by mouth every morning.  Marland Kitchen atorvastatin (LIPITOR) 10 MG tablet Take 10 mg by mouth daily. Reported on 05/14/2015  . azelastine (ASTELIN) 0.1 % nasal spray Place 2 sprays into both nostrils 2 (two) times daily. Use in each nostril as directed  . cetirizine (ZYRTEC) 10 MG tablet Take 10 mg by mouth daily. Reported on 05/14/2015  . cholecalciferol (VITAMIN D) 1000 UNITS tablet Take 1,000 Units by mouth every morning. Reported on 05/14/2015  . Chromium-Cinnamon [phone] MCG-MG CAPS Take 1 tablet by mouth at bedtime.   Marland Kitchen Dextromethorphan-Guaifenesin (TUSSIN DM PO) Take 5 mLs by mouth as needed (for cough).   . diclofenac sodium (VOLTAREN) 1 % GEL Apply 4 g topically 2 (two) times daily as needed (for joint pain). For joint pain  . Dietary Management Product (TOZAL PO) Take 3 tablets by mouth daily. For eyes  . docusate sodium (COLACE) 100 MG capsule Take 100-200 mg by mouth at bedtime as needed for constipation.   . fluticasone (FLONASE) 50 MCG/ACT nasal spray Place 2 sprays into the nose 2 (two) times daily.   Marland Kitchen glipiZIDE (GLUCOTROL XL) 10 MG 24 hr tablet Take 10 mg by mouth daily with breakfast.  . hydroxypropyl methylcellulose (ISOPTO TEARS) 2.5 % ophthalmic solution Place 1 drop into both eyes 3 (three) times daily as needed (for dry eyes).  . nebivolol (BYSTOLIC) 5 MG tablet Take 5 mg by mouth  daily.  . Omega-3 Fatty Acids (OMEGA 3 PO) Take 1 capsule by mouth every morning.  . pantoprazole (PROTONIX) 40 MG tablet Take 40 mg by mouth every morning.  . polyethylene glycol (MIRALAX / GLYCOLAX) packet Take 17 g by mouth daily as needed (for constipation).  . sertraline (ZOLOFT) 25 MG tablet Take 3 tablets (75 mg total) by mouth daily.  . sodium chloride (OCEAN) 0.65 % SOLN nasal spray Place 1 spray into both nostrils as needed for congestion.  . torsemide (DEMADEX) 20 MG tablet Take 3 tablets (60 mg total) by mouth daily.  . traMADol (ULTRAM) 50 MG tablet Take 50 mg by mouth 2 (two) times daily.    No current facility-administered medications on file prior to visit.      Past Medical Hx:  has a past medical history of Anxiety; Arthritis; CHF (congestive heart failure) ([place]); CKD (chronic kidney disease), stage III; Complication of anesthesia; Diabetes mellitus without complication ([place]); GERD (gastroesophageal reflux disease); Hypertension; Obesity; Obesity hypoventilation syndrome ([place]); Seasonal allergies; Shortness of breath; and Sleep apnea.   Past Surgical hx, Allergies, Family hx, Social hx all reviewed.  Vital Signs BP 112/78 (BP Location: Left Arm, Patient Position: Sitting, Cuff Size: Normal)   Pulse 78   SpO2 92%   History of Present Illness Makayla Rodriguez is a 83 y.o. female with a history of HTN, HLD, CHF, OSA, chronic hypoxemic respiratory  failure on 2-3 L O2 who presented to the pulmonary office for hospital follow up.  She was recently admitted from 7/23-8/2 for acute on chronic respiratory failure, decompensated CHF. She required a prolonged hospitalization and admission to an LTAC.  Chart review shows that she had significant edema and was aggressively diuresed. She unfortunately failed noninvasive ventilation and required intubation.  He also had subsequent episode of hypercarbia in the setting of anxiolytics.  The patient was recommended to have home BiPAP at  discharge case management was unable to arrange. She was seen by palliative care during hospitalization and made a DO NOT RESUSCITATE.  Pt reports increased cough since discharge from hospital with clear mucus production.  Reports nasal gtt & congestion.  Denies fevers, chills, nausea, vomiting, diarrhea, known increased swelling.  The patient is unsure if she has gained weight as she cannot stand to weigh. She has multiple complaints regarding her CPAP mask, machine. He feels that the mask does not fit properly and is concerned that her machine is not working. She is not able to verbalize which agency provides her CPAP machine.  She is concerned that she is also course. She states that she has been hoarse for some time but is worse since she was intubated.  Physical Exam  General - well developed adult F in no acute distress ENT - No sinus tenderness, no oral exudate, no LAN Cardiac - s1s2 regular, no murmur Chest - even/non-labored, lungs bilaterally diminished but clear. No wheeze/rales Back - No focal tenderness Abd - Soft, non-tender Ext - difficult to assess for edema due to obesity Neuro - Normal strength Skin - No rashes Psych - normal mood, and behavior  Studies: 12/19/16 ECHO >> LVEF 65-70%, no wall motion abnormalities, moderate AS, mild MS with trivial regurgitation, LA severely dilated    Assessment/Plan: Primary Pulmonary - Dr. 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  She was hypoxic on arrival in the wheelchair.  O2 increased to 4L with improvement in saturations to 92%.     Chronic Hypoxic Respiratory Failure - previously 2L dependent day, 3L QHS.  Plan: Increase O2 to 4L continuous  Will ensure that O2 is set up for bleed in with CPAP Assess ONO on CPAP, 4L    OHS/OSA - ECHO  12/19/16 with mild RA dilation, no mention of PH Plan:  On CPAP 9 cm H2O, last sleep study in 2014.   Have home agency assess for mask fit at home  Follow up with Dr. Elsworth Soho in 3 months for CPAP download Unable to do home sleep study > hold for now Obtain download    Post Nasal Drip Cough  Plan: Continue zyrtec, flonase   Hoarseness - chronic, pt states worse since intubation, ? Trauma from ETT  Plan: May need ENT evaluation if hoarseness does not improve   Patient Instructions  1. We will arrange for a home sleep study to evaluate your CPAP needs 2. Increase her oxygen to 4 L all the time 3. We have asked the home health agency to come out and assess see her mask fit. 4. Continue to wear your CPAP every night and during daytime sleep 5. Continue to use your Zyrtec and nasal spray. I suspect some of your cough is coming from postnasal drip.  6.  I will discuss with Dr.  Elsworth Soho regarding ENT evaluation for your hoarseness 7. Please return to see Dr. Elsworth Soho in 3 months for CPAP download 8.  Call for new or worsening symptoms   Noe Gens, NP-C [place]  [phone] 02/22/2017, 3:01 PM

## 2017-02-22 NOTE — Progress Notes (Deleted)
[facility]    No chief complaint on file.    Current Outpatient Prescriptions on File Prior to Visit  Medication Sig  . acetaminophen (TYLENOL) 500 MG tablet Take 500 mg by mouth every 6 (six) hours as needed for mild pain.   Marland Kitchen albuterol (PROAIR HFA) 108 (90 Base) MCG/ACT inhaler Inhale 2 puffs into the lungs every 6 (six) hours as needed for wheezing or shortness of breath.  Marland Kitchen aspirin EC 81 MG tablet Take 81 mg by mouth every morning.  Marland Kitchen atorvastatin (LIPITOR) 10 MG tablet Take 10 mg by mouth daily. Reported on 05/14/2015  . azelastine (ASTELIN) 0.1 % nasal spray Place 2 sprays into both nostrils 2 (two) times daily. Use in each nostril as directed  . cetirizine (ZYRTEC) 10 MG tablet Take 10 mg by mouth daily. Reported on 05/14/2015  . cholecalciferol (VITAMIN D) 1000 UNITS tablet Take 1,000 Units by mouth every morning. Reported on 05/14/2015  . Chromium-Cinnamon [phone] MCG-MG CAPS Take 1 tablet by mouth at bedtime.   Marland Kitchen Dextromethorphan-Guaifenesin (TUSSIN DM PO) Take 5 mLs by mouth as needed (for cough).   . diclofenac sodium (VOLTAREN) 1 % GEL Apply 4 g topically 2 (two) times daily as needed (for joint pain). For joint pain  . Dietary Management Product (TOZAL PO) Take 3 tablets by mouth daily. For eyes  . docusate sodium (COLACE) 100 MG capsule Take 100-200 mg by mouth at bedtime as needed for constipation.   . fluticasone (FLONASE) 50 MCG/ACT nasal spray Place 2 sprays into the nose 2 (two) times daily.   Marland Kitchen glipiZIDE (GLUCOTROL XL) 10 MG 24 hr tablet Take 10 mg by mouth daily with breakfast.  . hydroxypropyl methylcellulose (ISOPTO TEARS) 2.5 % ophthalmic solution Place 1 drop into both eyes 3 (three) times daily as needed (for dry eyes).  . nebivolol (BYSTOLIC) 5 MG tablet Take 5 mg by mouth daily.  . Omega-3 Fatty Acids (OMEGA 3 PO) Take 1 capsule by mouth every morning.  . pantoprazole (PROTONIX) 40 MG tablet Take 40 mg by mouth every morning.  . polyethylene glycol  (MIRALAX / GLYCOLAX) packet Take 17 g by mouth daily as needed (for constipation).  . sertraline (ZOLOFT) 25 MG tablet Take 3 tablets (75 mg total) by mouth daily.  . sodium chloride (OCEAN) 0.65 % SOLN nasal spray Place 1 spray into both nostrils as needed for congestion.  . torsemide (DEMADEX) 20 MG tablet Take 3 tablets (60 mg total) by mouth daily.  . traMADol (ULTRAM) 50 MG tablet Take 50 mg by mouth 2 (two) times daily.    No current facility-administered medications on file prior to visit.      Past Medical Hx:  has a past medical history of Anxiety; Arthritis; CHF (congestive heart failure) ([place]); CKD (chronic kidney disease), stage III; Complication of anesthesia; Diabetes mellitus without complication ([place]); GERD (gastroesophageal reflux disease); Hypertension; Obesity; Obesity hypoventilation syndrome ([place]); Seasonal allergies; Shortness of breath; and Sleep apnea.   Past Surgical hx, Allergies, Family hx, Social hx all reviewed.  Vital Signs There were no vitals taken for this visit.  History of Present Illness Makayla Rodriguez is a 83 y.o. female with a history of HTN, HLD, CHF, OSA, chronic hypoxemic respiratory failure on 2-3 L O2 who presented to the pulmonary office for hospital follow up.  She was recently admitted from 7/23-8/2 for acute on chronic respiratory failure, decompensated CHF. She required a prolonged hospitalization and admission to an LTAC.  Chart review shows that she  had significant edema and was aggressively diuresed. She unfortunately failed noninvasive ventilation and required intubation.  He also had subsequent episode of hypercarbia in the setting of anxiolytics.  The patient was recommended to have home BiPAP at discharge case management was unable to arrange. She was seen by palliative care during hospitalization and made a DO NOT RESUSCITATE.    Physical Exam  General - well developed adult F in no acute distress ENT - No sinus tenderness, no oral  exudate, no LAN Cardiac - s1s2 regular, no murmur Chest - even/non-labored, lungs bilaterally ***. No wheeze/rales Back - No focal tenderness Abd - Soft, non-tender Ext - No edema Neuro - Normal strength Skin - No rashes Psych - normal mood, and behavior  Studies: 12/19/16 ECHO >> LVEF 65-70%, no wall motion abnormalities, moderate AS, mild MS with trivial regurgitation, LA severely dilated    Assessment/Plan: Primary Pulmonary - Dr. 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    Noe Gens, NP-C [facility]  [phone] 02/22/2017, 9:27 AM

## 2017-02-22 NOTE — Patient Instructions (Addendum)
1. We will arrange for a home sleep study to evaluate your CPAP needs 2. Increase her oxygen to 4 L all the time 3. We have asked the home health agency to come out and assess see her mask fit. 4. Continue to wear your CPAP every night and during daytime sleep 5. Continue to use your Zyrtec and nasal spray. I suspect some of your cough is coming from postnasal drip.  6.  I will discuss with Dr. Elsworth Soho regarding ENT evaluation for your hoarseness 7. Please return to see Dr. Elsworth Soho in 3 months for CPAP download 8.  Call for new or worsening symptoms

## 2017-02-23 NOTE — Addendum Note (Signed)
Addended by: Jannette Spanner on: 02/23/2017 01:56 PM   Modules accepted: Orders

## 2017-02-23 NOTE — Telephone Encounter (Signed)
ONO ordered.  

## 2017-02-27 DIAGNOSIS — N183 Chronic kidney disease, stage 3 (moderate): Secondary | ICD-10-CM | POA: Diagnosis not present

## 2017-02-27 DIAGNOSIS — E1122 Type 2 diabetes mellitus with diabetic chronic kidney disease: Secondary | ICD-10-CM | POA: Diagnosis not present

## 2017-02-27 DIAGNOSIS — I130 Hypertensive heart and chronic kidney disease with heart failure and stage 1 through stage 4 chronic kidney disease, or unspecified chronic kidney disease: Secondary | ICD-10-CM | POA: Diagnosis not present

## 2017-02-27 DIAGNOSIS — D631 Anemia in chronic kidney disease: Secondary | ICD-10-CM | POA: Diagnosis not present

## 2017-02-27 DIAGNOSIS — I5033 Acute on chronic diastolic (congestive) heart failure: Secondary | ICD-10-CM | POA: Diagnosis not present

## 2017-02-27 DIAGNOSIS — E662 Morbid (severe) obesity with alveolar hypoventilation: Secondary | ICD-10-CM | POA: Diagnosis not present

## 2017-02-28 NOTE — Telephone Encounter (Signed)
Download requested from [facility] Patient. Nothing further is needed.

## 2017-03-01 DIAGNOSIS — E662 Morbid (severe) obesity with alveolar hypoventilation: Secondary | ICD-10-CM | POA: Diagnosis not present

## 2017-03-01 DIAGNOSIS — I5033 Acute on chronic diastolic (congestive) heart failure: Secondary | ICD-10-CM | POA: Diagnosis not present

## 2017-03-01 DIAGNOSIS — E1122 Type 2 diabetes mellitus with diabetic chronic kidney disease: Secondary | ICD-10-CM | POA: Diagnosis not present

## 2017-03-01 DIAGNOSIS — D631 Anemia in chronic kidney disease: Secondary | ICD-10-CM | POA: Diagnosis not present

## 2017-03-01 DIAGNOSIS — N183 Chronic kidney disease, stage 3 (moderate): Secondary | ICD-10-CM | POA: Diagnosis not present

## 2017-03-01 DIAGNOSIS — I130 Hypertensive heart and chronic kidney disease with heart failure and stage 1 through stage 4 chronic kidney disease, or unspecified chronic kidney disease: Secondary | ICD-10-CM | POA: Diagnosis not present

## 2017-03-02 ENCOUNTER — Telehealth: Payer: Self-pay | Admitting: Pulmonary Disease

## 2017-03-02 DIAGNOSIS — G4733 Obstructive sleep apnea (adult) (pediatric): Secondary | ICD-10-CM

## 2017-03-02 NOTE — Telephone Encounter (Signed)
Called the sleep center to arrange for mask desensitization.    Monday Oct 22 @ 2pm  Report to the Sleep Center at [facility] Patient will need to call [phone] to change the appointment if necessary.    Please set her up with an appointment 3 months after the mask fitting for download.     Noe Gens, NP-C [facility] Pgr: [phone] or if no answer [phone] 03/02/2017, 5:09 PM

## 2017-03-02 NOTE — Telephone Encounter (Signed)
Spoke with pt, advised message from [place]. She states she received some mask from [place] at [facility] Patient and she does not want mask fitting at [facility] right now. PCC's can we check on the ONO, her nurse states nobody called her.

## 2017-03-03 DIAGNOSIS — G4733 Obstructive sleep apnea (adult) (pediatric): Secondary | ICD-10-CM | POA: Diagnosis not present

## 2017-03-06 ENCOUNTER — Telehealth: Payer: Self-pay | Admitting: Pulmonary Disease

## 2017-03-06 DIAGNOSIS — E1122 Type 2 diabetes mellitus with diabetic chronic kidney disease: Secondary | ICD-10-CM | POA: Diagnosis not present

## 2017-03-06 DIAGNOSIS — D631 Anemia in chronic kidney disease: Secondary | ICD-10-CM | POA: Diagnosis not present

## 2017-03-06 DIAGNOSIS — I130 Hypertensive heart and chronic kidney disease with heart failure and stage 1 through stage 4 chronic kidney disease, or unspecified chronic kidney disease: Secondary | ICD-10-CM | POA: Diagnosis not present

## 2017-03-06 DIAGNOSIS — G4733 Obstructive sleep apnea (adult) (pediatric): Secondary | ICD-10-CM

## 2017-03-06 DIAGNOSIS — I5033 Acute on chronic diastolic (congestive) heart failure: Secondary | ICD-10-CM | POA: Diagnosis not present

## 2017-03-06 DIAGNOSIS — N183 Chronic kidney disease, stage 3 (moderate): Secondary | ICD-10-CM | POA: Diagnosis not present

## 2017-03-06 DIAGNOSIS — E662 Morbid (severe) obesity with alveolar hypoventilation: Secondary | ICD-10-CM | POA: Diagnosis not present

## 2017-03-06 NOTE — Telephone Encounter (Signed)
Pt hasn't been scheduled for sleep study.  The only order that was put in was for pt to go to the sleep lab for a mask fitting.  I do see per note on 10/4 that pt was given a new mask by [facility] & she does not want to go for mask fitting at this time.  I can call the Sleep Lab & cancel mask fitting.  There was an order put in for an ONO that was sent to [facility] but they should contact her about that & it will be done in her home.

## 2017-03-06 NOTE — Telephone Encounter (Signed)
Spoke with pt's caregiver. I believe we ordered a sleep test for her to do at home. Does her insurance require to have an in iab sleep study? PCC's can you help? Her caregiver stated it is very hard to get the patient out and she needs transportation services.

## 2017-03-06 NOTE — Telephone Encounter (Signed)
Ok lets see if we can do a home sleep test and lets wait until we cancel appt with Lynnae Sandhoff because she needs this mask fir=tting because she is not comfortable and ends up fighting with it in her sleep per caregiver. I am going to try to convince them to get her there so we can see if there is a comfortable fir for her.

## 2017-03-06 NOTE — Telephone Encounter (Signed)
I do see sleep study order.  We will get that precerted & call her to get it scheduled.  I have already canceled the mask fitting appt but it can be rescheduled if caregiver will take her.

## 2017-03-07 DIAGNOSIS — N183 Chronic kidney disease, stage 3 (moderate): Secondary | ICD-10-CM | POA: Diagnosis not present

## 2017-03-07 DIAGNOSIS — E1122 Type 2 diabetes mellitus with diabetic chronic kidney disease: Secondary | ICD-10-CM | POA: Diagnosis not present

## 2017-03-07 DIAGNOSIS — I5033 Acute on chronic diastolic (congestive) heart failure: Secondary | ICD-10-CM | POA: Diagnosis not present

## 2017-03-07 DIAGNOSIS — I130 Hypertensive heart and chronic kidney disease with heart failure and stage 1 through stage 4 chronic kidney disease, or unspecified chronic kidney disease: Secondary | ICD-10-CM | POA: Diagnosis not present

## 2017-03-07 DIAGNOSIS — D631 Anemia in chronic kidney disease: Secondary | ICD-10-CM | POA: Diagnosis not present

## 2017-03-07 DIAGNOSIS — E662 Morbid (severe) obesity with alveolar hypoventilation: Secondary | ICD-10-CM | POA: Diagnosis not present

## 2017-03-08 DIAGNOSIS — D631 Anemia in chronic kidney disease: Secondary | ICD-10-CM | POA: Diagnosis not present

## 2017-03-08 DIAGNOSIS — E662 Morbid (severe) obesity with alveolar hypoventilation: Secondary | ICD-10-CM | POA: Diagnosis not present

## 2017-03-08 DIAGNOSIS — N183 Chronic kidney disease, stage 3 (moderate): Secondary | ICD-10-CM | POA: Diagnosis not present

## 2017-03-08 DIAGNOSIS — I130 Hypertensive heart and chronic kidney disease with heart failure and stage 1 through stage 4 chronic kidney disease, or unspecified chronic kidney disease: Secondary | ICD-10-CM | POA: Diagnosis not present

## 2017-03-08 DIAGNOSIS — I5033 Acute on chronic diastolic (congestive) heart failure: Secondary | ICD-10-CM | POA: Diagnosis not present

## 2017-03-08 DIAGNOSIS — E1122 Type 2 diabetes mellitus with diabetic chronic kidney disease: Secondary | ICD-10-CM | POA: Diagnosis not present

## 2017-03-15 ENCOUNTER — Ambulatory Visit: Payer: Medicare Other | Admitting: Interventional Cardiology

## 2017-03-15 DIAGNOSIS — E662 Morbid (severe) obesity with alveolar hypoventilation: Secondary | ICD-10-CM | POA: Diagnosis not present

## 2017-03-15 DIAGNOSIS — I130 Hypertensive heart and chronic kidney disease with heart failure and stage 1 through stage 4 chronic kidney disease, or unspecified chronic kidney disease: Secondary | ICD-10-CM | POA: Diagnosis not present

## 2017-03-15 DIAGNOSIS — D631 Anemia in chronic kidney disease: Secondary | ICD-10-CM | POA: Diagnosis not present

## 2017-03-15 DIAGNOSIS — N183 Chronic kidney disease, stage 3 (moderate): Secondary | ICD-10-CM | POA: Diagnosis not present

## 2017-03-15 DIAGNOSIS — I5033 Acute on chronic diastolic (congestive) heart failure: Secondary | ICD-10-CM | POA: Diagnosis not present

## 2017-03-15 DIAGNOSIS — E1122 Type 2 diabetes mellitus with diabetic chronic kidney disease: Secondary | ICD-10-CM | POA: Diagnosis not present

## 2017-03-16 DIAGNOSIS — N183 Chronic kidney disease, stage 3 (moderate): Secondary | ICD-10-CM | POA: Diagnosis not present

## 2017-03-16 DIAGNOSIS — I5033 Acute on chronic diastolic (congestive) heart failure: Secondary | ICD-10-CM | POA: Diagnosis not present

## 2017-03-16 DIAGNOSIS — I130 Hypertensive heart and chronic kidney disease with heart failure and stage 1 through stage 4 chronic kidney disease, or unspecified chronic kidney disease: Secondary | ICD-10-CM | POA: Diagnosis not present

## 2017-03-16 DIAGNOSIS — E1122 Type 2 diabetes mellitus with diabetic chronic kidney disease: Secondary | ICD-10-CM | POA: Diagnosis not present

## 2017-03-16 DIAGNOSIS — E662 Morbid (severe) obesity with alveolar hypoventilation: Secondary | ICD-10-CM | POA: Diagnosis not present

## 2017-03-16 DIAGNOSIS — D631 Anemia in chronic kidney disease: Secondary | ICD-10-CM | POA: Diagnosis not present

## 2017-03-17 ENCOUNTER — Telehealth: Payer: Self-pay

## 2017-03-17 NOTE — Telephone Encounter (Signed)
Makayla Rodriguez we didn't order sleep test did we? I think we agreed on an ONO with 4L bled in?

## 2017-03-17 NOTE — Telephone Encounter (Signed)
She should have an ONO on 4L with her new mask.  Please cancel the sleep study and desensitization appointment.  Thank you!  Noe Gens, NP-C [facility] Pgr: [phone] or if no answer [phone] 03/17/2017, 2:06 PM

## 2017-03-17 NOTE — Addendum Note (Signed)
Addended by: Jannette Spanner on: 03/17/2017 10:37 AM   Modules accepted: Orders

## 2017-03-17 NOTE — Telephone Encounter (Signed)
Ok I confirmed with [name]. Makayla Rodriguez

## 2017-03-18 ENCOUNTER — Inpatient Hospital Stay (HOSPITAL_COMMUNITY)
Admission: EM | Admit: 2017-03-18 | Discharge: 2017-03-20 | DRG: 205 | Disposition: A | Discharge status: Home Health | Payer: Medicare Other | Attending: Internal Medicine | Admitting: Internal Medicine

## 2017-03-18 ENCOUNTER — Encounter (HOSPITAL_COMMUNITY): Payer: Self-pay | Admitting: Internal Medicine

## 2017-03-18 ENCOUNTER — Emergency Department (HOSPITAL_COMMUNITY): Payer: Medicare Other

## 2017-03-18 DIAGNOSIS — Z7951 Long term (current) use of inhaled steroids: Secondary | ICD-10-CM

## 2017-03-18 DIAGNOSIS — E1122 Type 2 diabetes mellitus with diabetic chronic kidney disease: Secondary | ICD-10-CM | POA: Diagnosis present

## 2017-03-18 DIAGNOSIS — E785 Hyperlipidemia, unspecified: Secondary | ICD-10-CM | POA: Diagnosis present

## 2017-03-18 DIAGNOSIS — E662 Morbid (severe) obesity with alveolar hypoventilation: Principal | ICD-10-CM | POA: Diagnosis present

## 2017-03-18 DIAGNOSIS — N183 Chronic kidney disease, stage 3 unspecified: Secondary | ICD-10-CM | POA: Diagnosis present

## 2017-03-18 DIAGNOSIS — J9621 Acute and chronic respiratory failure with hypoxia: Secondary | ICD-10-CM | POA: Diagnosis not present

## 2017-03-18 DIAGNOSIS — I5033 Acute on chronic diastolic (congestive) heart failure: Secondary | ICD-10-CM | POA: Diagnosis not present

## 2017-03-18 DIAGNOSIS — K219 Gastro-esophageal reflux disease without esophagitis: Secondary | ICD-10-CM | POA: Diagnosis present

## 2017-03-18 DIAGNOSIS — Z9981 Dependence on supplemental oxygen: Secondary | ICD-10-CM | POA: Diagnosis not present

## 2017-03-18 DIAGNOSIS — R748 Abnormal levels of other serum enzymes: Secondary | ICD-10-CM | POA: Diagnosis present

## 2017-03-18 DIAGNOSIS — Z79891 Long term (current) use of opiate analgesic: Secondary | ICD-10-CM

## 2017-03-18 DIAGNOSIS — Z881 Allergy status to other antibiotic agents status: Secondary | ICD-10-CM | POA: Diagnosis not present

## 2017-03-18 DIAGNOSIS — Z880 Allergy status to penicillin: Secondary | ICD-10-CM

## 2017-03-18 DIAGNOSIS — Z800 Family history of malignant neoplasm of digestive organs: Secondary | ICD-10-CM | POA: Diagnosis not present

## 2017-03-18 DIAGNOSIS — I5032 Chronic diastolic (congestive) heart failure: Secondary | ICD-10-CM

## 2017-03-18 DIAGNOSIS — Z9119 Patient's noncompliance with other medical treatment and regimen: Secondary | ICD-10-CM

## 2017-03-18 DIAGNOSIS — Z794 Long term (current) use of insulin: Secondary | ICD-10-CM

## 2017-03-18 DIAGNOSIS — Z87891 Personal history of nicotine dependence: Secondary | ICD-10-CM

## 2017-03-18 DIAGNOSIS — J449 Chronic obstructive pulmonary disease, unspecified: Secondary | ICD-10-CM | POA: Diagnosis present

## 2017-03-18 DIAGNOSIS — J9601 Acute respiratory failure with hypoxia: Secondary | ICD-10-CM | POA: Diagnosis present

## 2017-03-18 DIAGNOSIS — J302 Other seasonal allergic rhinitis: Secondary | ICD-10-CM | POA: Diagnosis present

## 2017-03-18 DIAGNOSIS — I10 Essential (primary) hypertension: Secondary | ICD-10-CM | POA: Diagnosis not present

## 2017-03-18 DIAGNOSIS — Z79899 Other long term (current) drug therapy: Secondary | ICD-10-CM

## 2017-03-18 DIAGNOSIS — I130 Hypertensive heart and chronic kidney disease with heart failure and stage 1 through stage 4 chronic kidney disease, or unspecified chronic kidney disease: Secondary | ICD-10-CM | POA: Diagnosis present

## 2017-03-18 DIAGNOSIS — J80 Acute respiratory distress syndrome: Secondary | ICD-10-CM | POA: Diagnosis not present

## 2017-03-18 DIAGNOSIS — Z888 Allergy status to other drugs, medicaments and biological substances status: Secondary | ICD-10-CM

## 2017-03-18 DIAGNOSIS — R0602 Shortness of breath: Secondary | ICD-10-CM | POA: Diagnosis not present

## 2017-03-18 DIAGNOSIS — F329 Major depressive disorder, single episode, unspecified: Secondary | ICD-10-CM | POA: Diagnosis present

## 2017-03-18 DIAGNOSIS — I071 Rheumatic tricuspid insufficiency: Secondary | ICD-10-CM | POA: Diagnosis present

## 2017-03-18 DIAGNOSIS — Z853 Personal history of malignant neoplasm of breast: Secondary | ICD-10-CM

## 2017-03-18 DIAGNOSIS — R05 Cough: Secondary | ICD-10-CM | POA: Diagnosis not present

## 2017-03-18 DIAGNOSIS — Z6841 Body Mass Index (BMI) 40.0 and over, adult: Secondary | ICD-10-CM | POA: Diagnosis not present

## 2017-03-18 DIAGNOSIS — Z7982 Long term (current) use of aspirin: Secondary | ICD-10-CM

## 2017-03-18 DIAGNOSIS — E118 Type 2 diabetes mellitus with unspecified complications: Secondary | ICD-10-CM

## 2017-03-18 DIAGNOSIS — J189 Pneumonia, unspecified organism: Secondary | ICD-10-CM

## 2017-03-18 DIAGNOSIS — F419 Anxiety disorder, unspecified: Secondary | ICD-10-CM | POA: Diagnosis present

## 2017-03-18 DIAGNOSIS — F32A Depression, unspecified: Secondary | ICD-10-CM | POA: Diagnosis present

## 2017-03-18 DIAGNOSIS — Z885 Allergy status to narcotic agent status: Secondary | ICD-10-CM

## 2017-03-18 DIAGNOSIS — M1990 Unspecified osteoarthritis, unspecified site: Secondary | ICD-10-CM | POA: Diagnosis present

## 2017-03-18 DIAGNOSIS — Z8249 Family history of ischemic heart disease and other diseases of the circulatory system: Secondary | ICD-10-CM | POA: Diagnosis not present

## 2017-03-18 DIAGNOSIS — G4733 Obstructive sleep apnea (adult) (pediatric): Secondary | ICD-10-CM | POA: Diagnosis not present

## 2017-03-18 DIAGNOSIS — I5040 Unspecified combined systolic (congestive) and diastolic (congestive) heart failure: Secondary | ICD-10-CM | POA: Diagnosis not present

## 2017-03-18 DIAGNOSIS — J9622 Acute and chronic respiratory failure with hypercapnia: Secondary | ICD-10-CM | POA: Diagnosis not present

## 2017-03-18 DIAGNOSIS — I129 Hypertensive chronic kidney disease with stage 1 through stage 4 chronic kidney disease, or unspecified chronic kidney disease: Secondary | ICD-10-CM | POA: Diagnosis not present

## 2017-03-18 DIAGNOSIS — I509 Heart failure, unspecified: Secondary | ICD-10-CM

## 2017-03-18 DIAGNOSIS — R0682 Tachypnea, not elsewhere classified: Secondary | ICD-10-CM | POA: Diagnosis not present

## 2017-03-18 DIAGNOSIS — R7989 Other specified abnormal findings of blood chemistry: Secondary | ICD-10-CM

## 2017-03-18 DIAGNOSIS — J9600 Acute respiratory failure, unspecified whether with hypoxia or hypercapnia: Secondary | ICD-10-CM | POA: Diagnosis not present

## 2017-03-18 DIAGNOSIS — E1129 Type 2 diabetes mellitus with other diabetic kidney complication: Secondary | ICD-10-CM | POA: Diagnosis present

## 2017-03-18 DIAGNOSIS — R778 Other specified abnormalities of plasma proteins: Secondary | ICD-10-CM | POA: Diagnosis present

## 2017-03-18 DIAGNOSIS — I361 Nonrheumatic tricuspid (valve) insufficiency: Secondary | ICD-10-CM | POA: Diagnosis not present

## 2017-03-18 LAB — BASIC METABOLIC PANEL
ANION GAP: 14 (ref 5–15)
BUN: 38 mg/dL — AB (ref 6–20)
CALCIUM: 9.9 mg/dL (ref 8.9–10.3)
CO2: 39 mmol/L — AB (ref 22–32)
Chloride: 87 mmol/L — ABNORMAL LOW (ref 101–111)
Creatinine, Ser: 0.94 mg/dL (ref 0.44–1.00)
GFR calc Af Amer: >60 mL/min (ref >60)
GFR calc non Af Amer: 55 mL/min — ABNORMAL LOW (ref >60)
GLUCOSE: 228 mg/dL — AB (ref 65–99)
POTASSIUM: 3.8 mmol/L (ref 3.5–5.1)
Sodium: 140 mmol/L (ref 135–145)

## 2017-03-18 LAB — CBC WITH DIFFERENTIAL/PLATELET
Basophils Absolute: 0 10*3/uL (ref 0.0–0.1)
Basophils Relative: 0 %
Eosinophils Absolute: 0.5 10*3/uL (ref 0.0–0.7)
Eosinophils Relative: 5 %
HEMATOCRIT: 36.2 % (ref 36.0–46.0)
HEMOGLOBIN: 11.3 g/dL — AB (ref 12.0–15.0)
LYMPHS ABS: 3 10*3/uL (ref 0.7–4.0)
Lymphocytes Relative: 29 %
MCH: 29.1 pg (ref 26.0–34.0)
MCHC: 31.2 g/dL (ref 30.0–36.0)
MCV: 93.3 fL (ref 78.0–100.0)
MONOS PCT: 11 %
Monocytes Absolute: 1.1 10*3/uL — ABNORMAL HIGH (ref 0.1–1.0)
NEUTROS ABS: 5.7 10*3/uL (ref 1.7–7.7)
NEUTROS PCT: 55 %
Platelets: 233 10*3/uL (ref 150–400)
RBC: 3.88 MIL/uL (ref 3.87–5.11)
RDW: 13 % (ref 11.5–15.5)
WBC: 10.3 10*3/uL (ref 4.0–10.5)

## 2017-03-18 LAB — GLUCOSE, CAPILLARY: GLUCOSE-CAPILLARY: 260 mg/dL — AB (ref 65–99)

## 2017-03-18 LAB — TROPONIN I
Troponin I: 0.03 ng/mL (ref <0.03)
Troponin I: <0.03 ng/mL (ref <0.03)

## 2017-03-18 LAB — BRAIN NATRIURETIC PEPTIDE: B Natriuretic Peptide: 164.4 pg/mL — ABNORMAL HIGH (ref 0.0–100.0)

## 2017-03-18 MED ORDER — INSULIN GLARGINE 100 UNIT/ML ~~LOC~~ SOLN
7.0000 [IU] | Freq: Every day | SUBCUTANEOUS | Status: DC
  Administered 2017-03-19 (×2): 7 [IU] via SUBCUTANEOUS
  Filled 2017-03-18 (×4): qty 0.07

## 2017-03-18 MED ORDER — AZELASTINE HCL 0.1 % NA SOLN
2.0000 | Freq: Two times a day (BID) | NASAL | Status: DC
  Administered 2017-03-18 – 2017-03-20 (×4): 2 via NASAL
  Filled 2017-03-18: qty 30

## 2017-03-18 MED ORDER — ONDANSETRON HCL 4 MG/2ML IJ SOLN: 4.0000 mg | Freq: Four times a day (QID) | INTRAMUSCULAR | Status: DC | PRN

## 2017-03-18 MED ORDER — DICLOFENAC SODIUM 1 % TD GEL: 4.0000 g | Freq: Two times a day (BID) | TRANSDERMAL | Status: DC | PRN

## 2017-03-18 MED ORDER — FUROSEMIDE 10 MG/ML IJ SOLN
60.0000 mg | Freq: Two times a day (BID) | INTRAMUSCULAR | Status: DC
  Administered 2017-03-19 (×2): 60 mg via INTRAVENOUS
  Filled 2017-03-18 (×2): qty 6

## 2017-03-18 MED ORDER — LORATADINE 10 MG PO TABS
10.0000 mg | ORAL_TABLET | Freq: Every day | ORAL | Status: DC
  Administered 2017-03-19 – 2017-03-20 (×2): 10 mg via ORAL
  Filled 2017-03-18 (×2): qty 1

## 2017-03-18 MED ORDER — OMEGA-3-ACID ETHYL ESTERS 1 G PO CAPS
1.0000 g | ORAL_CAPSULE | Freq: Every morning | ORAL | Status: DC
  Administered 2017-03-19 – 2017-03-20 (×2): 1 g via ORAL
  Filled 2017-03-18 (×2): qty 1

## 2017-03-18 MED ORDER — ACETAMINOPHEN 500 MG PO TABS
500.0000 mg | ORAL_TABLET | Freq: Four times a day (QID) | ORAL | Status: DC | PRN
  Administered 2017-03-18 – 2017-03-20 (×2): 500 mg via ORAL
  Filled 2017-03-18 (×3): qty 1

## 2017-03-18 MED ORDER — IPRATROPIUM-ALBUTEROL 0.5-2.5 (3) MG/3ML IN SOLN
3.0000 mL | RESPIRATORY_TRACT | Status: DC
  Administered 2017-03-18: 3 mL via RESPIRATORY_TRACT

## 2017-03-18 MED ORDER — ATORVASTATIN CALCIUM 10 MG PO TABS
10.0000 mg | ORAL_TABLET | Freq: Every day | ORAL | Status: DC
  Administered 2017-03-18 – 2017-03-19 (×2): 10 mg via ORAL
  Filled 2017-03-18 (×2): qty 1

## 2017-03-18 MED ORDER — GUAIFENESIN-DM 100-10 MG/5ML PO SYRP: 5.0000 mL | ORAL_SOLUTION | Freq: Four times a day (QID) | ORAL | Status: DC | PRN

## 2017-03-18 MED ORDER — HYPROMELLOSE (GONIOSCOPIC) 2.5 % OP SOLN: 1.0000 [drp] | Freq: Three times a day (TID) | OPHTHALMIC | Status: DC | PRN

## 2017-03-18 MED ORDER — HYDRALAZINE HCL 20 MG/ML IJ SOLN: 5.0000 mg | INTRAMUSCULAR | Status: DC | PRN

## 2017-03-18 MED ORDER — TRAMADOL HCL 50 MG PO TABS: 50.0000 mg | ORAL_TABLET | Freq: Four times a day (QID) | ORAL | Status: DC | PRN

## 2017-03-18 MED ORDER — PROSIGHT PO TABS
1.0000 | ORAL_TABLET | Freq: Every day | ORAL | Status: DC
  Administered 2017-03-18 – 2017-03-20 (×3): 1 via ORAL
  Filled 2017-03-18 (×3): qty 1

## 2017-03-18 MED ORDER — INSULIN ASPART 100 UNIT/ML ~~LOC~~ SOLN
0.0000 [IU] | Freq: Three times a day (TID) | SUBCUTANEOUS | Status: DC
  Administered 2017-03-19: 2 [IU] via SUBCUTANEOUS
  Administered 2017-03-19 – 2017-03-20 (×3): 3 [IU] via SUBCUTANEOUS

## 2017-03-18 MED ORDER — FLUTICASONE PROPIONATE 50 MCG/ACT NA SUSP
2.0000 | Freq: Two times a day (BID) | NASAL | Status: DC
  Administered 2017-03-18 – 2017-03-20 (×4): 2 via NASAL
  Filled 2017-03-18: qty 16

## 2017-03-18 MED ORDER — SODIUM CHLORIDE 0.9% FLUSH: 3.0000 mL | INTRAVENOUS | Status: DC | PRN

## 2017-03-18 MED ORDER — MAGNESIUM HYDROXIDE 400 MG/5ML PO SUSP: 15.0000 mL | Freq: Every day | ORAL | Status: DC | PRN

## 2017-03-18 MED ORDER — ENOXAPARIN SODIUM 40 MG/0.4ML ~~LOC~~ SOLN
40.0000 mg | Freq: Every day | SUBCUTANEOUS | Status: DC
  Administered 2017-03-18 – 2017-03-19 (×2): 40 mg via SUBCUTANEOUS
  Filled 2017-03-18 (×2): qty 0.4

## 2017-03-18 MED ORDER — SALINE SPRAY 0.65 % NA SOLN: 1.0000 | NASAL | Status: DC | PRN

## 2017-03-18 MED ORDER — ZOLPIDEM TARTRATE 5 MG PO TABS: 5.0000 mg | ORAL_TABLET | Freq: Every evening | ORAL | Status: DC | PRN

## 2017-03-18 MED ORDER — ALBUTEROL SULFATE (2.5 MG/3ML) 0.083% IN NEBU: 2.5000 mg | INHALATION_SOLUTION | RESPIRATORY_TRACT | Status: DC | PRN

## 2017-03-18 MED ORDER — SODIUM CHLORIDE 0.9% FLUSH
3.0000 mL | Freq: Two times a day (BID) | INTRAVENOUS | Status: DC
  Administered 2017-03-19 – 2017-03-20 (×3): 3 mL via INTRAVENOUS

## 2017-03-18 MED ORDER — SENNA 8.6 MG PO TABS
1.0000 | ORAL_TABLET | Freq: Every day | ORAL | Status: DC
  Administered 2017-03-19: 8.6 mg via ORAL
  Filled 2017-03-18 (×2): qty 1

## 2017-03-18 MED ORDER — DEXTROSE 5 % IV SOLN
2.0000 g | Freq: Once | INTRAVENOUS | Status: AC
  Administered 2017-03-18: 2 g via INTRAVENOUS
  Filled 2017-03-18: qty 2

## 2017-03-18 MED ORDER — SERTRALINE HCL 50 MG PO TABS
75.0000 mg | ORAL_TABLET | Freq: Every day | ORAL | Status: DC
  Administered 2017-03-19 – 2017-03-20 (×2): 75 mg via ORAL
  Filled 2017-03-18 (×2): qty 2

## 2017-03-18 MED ORDER — ASPIRIN EC 81 MG PO TBEC
81.0000 mg | DELAYED_RELEASE_TABLET | Freq: Every morning | ORAL | Status: DC
Start: 2017-03-19 — End: 2017-03-20
  Administered 2017-03-19 – 2017-03-20 (×2): 81 mg via ORAL
  Filled 2017-03-18 (×2): qty 1

## 2017-03-18 MED ORDER — IPRATROPIUM-ALBUTEROL 0.5-2.5 (3) MG/3ML IN SOLN
3.0000 mL | Freq: Three times a day (TID) | RESPIRATORY_TRACT | Status: DC
  Administered 2017-03-19 – 2017-03-20 (×3): 3 mL via RESPIRATORY_TRACT
  Filled 2017-03-18 (×4): qty 3

## 2017-03-18 MED ORDER — INFLUENZA VAC SPLIT HIGH-DOSE 0.5 ML IM SUSY
0.5000 mL | PREFILLED_SYRINGE | INTRAMUSCULAR | Status: DC
  Filled 2017-03-18: qty 0.5

## 2017-03-18 MED ORDER — VITAMIN D3 25 MCG (1000 UNIT) PO TABS
1000.0000 [IU] | ORAL_TABLET | Freq: Every morning | ORAL | Status: DC
  Administered 2017-03-19 – 2017-03-20 (×2): 1000 [IU] via ORAL
  Filled 2017-03-18 (×2): qty 1

## 2017-03-18 MED ORDER — VANCOMYCIN HCL IN DEXTROSE 1-5 GM/200ML-% IV SOLN
1000.0000 mg | Freq: Once | INTRAVENOUS | Status: DC
  Filled 2017-03-18: qty 200

## 2017-03-18 MED ORDER — NEBIVOLOL HCL 5 MG PO TABS
5.0000 mg | ORAL_TABLET | Freq: Every day | ORAL | Status: DC
  Administered 2017-03-19 – 2017-03-20 (×2): 5 mg via ORAL
  Filled 2017-03-18 (×2): qty 1

## 2017-03-18 MED ORDER — INSULIN GLARGINE 100 UNIT/ML ~~LOC~~ SOLN: 10.0000 [IU] | Freq: Every day | SUBCUTANEOUS | Status: DC

## 2017-03-18 MED ORDER — SODIUM CHLORIDE 0.9 % IV SOLN: 250.0000 mL | INTRAVENOUS | Status: DC | PRN

## 2017-03-18 NOTE — ED Notes (Signed)
Bed: WA16 Expected date:  Expected time:  Means of arrival:  Comments: EMS 

## 2017-03-18 NOTE — Progress Notes (Signed)
A consult was received from an ED physician for Vancomycin and Aztreonam per pharmacy dosing.  The patient's profile has been reviewed for ht/wt/allergies/indication/available labs.   A one time order has been placed for Vancomycin 1g and Aztreonam 2g.  Further antibiotics/pharmacy consults should be ordered by admitting physician if indicated.                       Thank you, Lolita Patella 03/18/2017  6:46 PM

## 2017-03-18 NOTE — ED Notes (Signed)
Blood cultures collected.

## 2017-03-18 NOTE — ED Triage Notes (Signed)
Per EMS, patient comes from home. C/o Sob and cough for few weeks, worst the past 3 days. Pt called her primary doctor who told her to come to the ED for a CXR. EMS gave 5mg  albuterol neb tx as pt was wheezing. Pt a/ox4, no pain.

## 2017-03-18 NOTE — ED Notes (Signed)
Attempted to call report on patient, nurse is busy and will call back.

## 2017-03-18 NOTE — ED Provider Notes (Signed)
Emergency Department Provider Note   I have reviewed the triage vital signs and the nursing notes.   HISTORY  Chief Complaint Shortness of Breath   HPI Makayla Soohoo is a 83 y.o. female with PMH of dCHF, GERD, DM, OSA, elevated BMI, and chronic hypercarbic respiratory failure on 4L home O2 presents to the emergency department for evaluation of worsening shortness of breath over the past 3 days. Patient states that she's felt short of breath for several weeks but has been mild. She feels like there are some congestion in the center of her chest that she's having difficulty clearing. She denies any fevers or shaking chills. No worsening productive cough for hemoptysis. She notes that her lower extremity edema and abdomen edema has been lower than normal. She uses home oxygen throughout the day and BiPAP at night. She is followed by pulmonology as an outpatient. She spoke with her primary care physician regarding her symptoms today who referred her to the emergency department for x-ray. With EMS she was given 5 mg albuterol nebulizer in route with no significant improvement in symptoms.   Past Medical History:  Diagnosis Date  . Anxiety   . Arthritis   . CHF (congestive heart failure) ([place])    12/13 admission  . CKD (chronic kidney disease), stage III   . Complication of anesthesia    O2 sat drop during  and went into coma  . Diabetes mellitus without complication ([place])   . GERD (gastroesophageal reflux disease)   . Hypertension   . Obesity   . Obesity hypoventilation syndrome ([place])    uses O2 at home  . Seasonal allergies   . Shortness of breath    with annxiety attack  . Sleep apnea    CPAP    Patient Active Problem List   Diagnosis Date Noted  . Elevated troponin 03/18/2017  . Depression 03/18/2017  . HLD (hyperlipidemia) 03/18/2017  . DNR (do not resuscitate) discussion   . Palliative care by specialist   . Acute exacerbation of CHF (congestive heart failure) ([place])  12/19/2016  . Diabetes mellitus with complication ([place])   . Hyperlipidemia   . Acute on chronic respiratory failure with hypoxia and hypercapnia ([place]) 12/31/2014  . Morbid obesity with body mass index of 45.0-49.9 in adult [facility]) 09/20/2014  . Acute hypercapnic respiratory failure ([place])   . Dyspnea 09/28/2013  . Allergic rhinitis 09/11/2013  . Cancer of upper-outer quadrant of female breast ([place]) 03/07/2013  . OSA (obstructive sleep apnea) 08/09/2012  . Obesity hypoventilation syndrome ([place]) 08/09/2012  . Pleural mass 05/15/2012  . Acute diastolic heart failure, NYHA class 2 ([place]) 05/11/2012  . Diabetes mellitus ([place]) 05/11/2012  . OA (osteoarthritis) of ankle 05/11/2012  . Essential hypertension 05/11/2012  . Morbid obesity ([place]) 05/11/2012    Past Surgical History:  Procedure Laterality Date  .  fybroid tumors    . BREAST SURGERY    . CHOLECYSTECTOMY    . EYE SURGERY     cataracts  . HERNIA REPAIR     umbilical  . PARTIAL MASTECTOMY WITH NEEDLE LOCALIZATION Left 04/05/2013   Procedure: PARTIAL MASTECTOMY WITH NEEDLE LOCALIZATION;  Surgeon: Adin Hector, MD;  Location: [place];  Service: General;  Laterality: Left;  . PILONIDAL CYST EXCISION     buttock  . TONSILLECTOMY      Current Outpatient Rx  . Order #: [id number] Class: Historical Med  . Order #: [id number] Class: Normal  . Order #: [id number] Class: Historical Med  . Order #:  [id number] Class: Historical Med  . Order #: [id number] Class: Normal  . Order #: [id number] Class: Historical Med  . Order #: [id number] Class: Historical Med  . Order #: [id number] Class: Historical Med  . Order #: [id number] Class: Historical Med  . Order #: [id number] Class: Historical Med  . Order #: [id number] Class: Historical Med  . Order #: [id number] Class: Historical Med  . Order #: [id number] Class: Historical Med  . Order #: [id number] Class: Historical Med  . Order #: [id number] Class: Historical Med  . Order #: [id number] Class: Historical Med  . Order #:  [id number] Class: Historical Med  . Order #: [id number] Class: Historical Med  . Order #: [id number] Class: No Print  . Order #: [id number] Class: OTC  . Order #: [id number] Class: Normal  . Order #: [id number] Class: Historical Med    Allergies Demerol [meperidine]; Penicillins; Phenergan [promethazine hcl]; Codeine; Macrobid [nitrofurantoin]; and Relafen [nabumetone]  Family History  Problem Relation Age of Onset  . Heart failure Mother   . Gallbladder disease Father   . Pancreatic cancer Sister     Social History Social History  Substance Use Topics  . Smoking status: Former Smoker    Packs/day: 0.50    Years: 40.00    Types: Cigarettes    Quit date: 05/30/1980  . Smokeless tobacco: Never Used  . Alcohol use No    Review of Systems  Constitutional: No fever/chills Eyes: No visual changes. ENT: No sore throat. Cardiovascular: Denies chest pain. Respiratory: Positive shortness of breath. Gastrointestinal: No abdominal pain.  No nausea, no vomiting.  No diarrhea.  No constipation. Genitourinary: Negative for dysuria. Musculoskeletal: Negative for back pain. Skin: Negative for rash. Neurological: Negative for headaches, focal weakness or numbness.  10-point ROS otherwise negative.  ____________________________________________   PHYSICAL EXAM:  VITAL SIGNS: ED Triage Vitals  Enc Vitals Group     BP 03/18/17 1556 134/87     Pulse --      Resp 03/18/17 1556 18     Temp 03/18/17 1556 97.9 F (36.6 C)     Temp Source 03/18/17 1556 Oral     SpO2 03/18/17 1550 99 %     Weight 03/18/17 1557 270 lb (122.5 kg)     Height 03/18/17 1557 5\' 3"  (1.6 m)   Constitutional: Alert and oriented. Well appearing and in no acute distress. Eyes: Conjunctivae are normal.  Head: Atraumatic. Nose: No congestion/rhinnorhea. Mouth/Throat: Mucous membranes are moist. Neck: No stridor.  Cardiovascular: Normal rate, regular rhythm. Good peripheral circulation. Grossly normal heart sounds.     Respiratory: Slight increased respiratory effort.  No retractions. Lungs with end-expiratory wheezing.  Gastrointestinal: Soft and nontender. No distention.  Musculoskeletal: No lower extremity tenderness nor significant LE edema. No gross deformities of extremities. Neurologic:  Normal speech and language. No gross focal neurologic deficits are appreciated.  Skin:  Skin is warm, dry and intact. No rash noted.   ____________________________________________   LABS (all labs ordered are listed, but only abnormal results are displayed)  Labs Reviewed  BASIC METABOLIC PANEL - Abnormal; Notable for the following:       Result Value   Chloride 87 (*)    CO2 39 (*)    Glucose, Bld 228 (*)    BUN 38 (*)    GFR calc non Af Amer 55 (*)    All other components within normal limits  CBC WITH DIFFERENTIAL/PLATELET - Abnormal; Notable for the following:    Hemoglobin 11.3 (*)    Monocytes Absolute 1.1 (*)    All other components within normal limits  TROPONIN I -  Abnormal; Notable for the following:    Troponin I 0.03 (*)    All other components within normal limits  CULTURE, BLOOD (ROUTINE X 2)  CULTURE, BLOOD (ROUTINE X 2)  BRAIN NATRIURETIC PEPTIDE   ____________________________________________  EKG   EKG Interpretation  Date/Time:  Saturday March 18 2017 16:28:00 EDT Ventricular Rate:  78 PR Interval:    QRS Duration: 100 QT Interval:  429 QTC Calculation: 489 R Axis:   -14 Text Interpretation:  Age not entered, assumed to be  83 years old for purpose of ECG interpretation Atrial fibrillation Ventricular premature complex Borderline prolonged QT interval No STEMI.  Confirmed by Nanda Quinton [phone]) on 03/18/2017 5:42:18 PM       ____________________________________________  RADIOLOGY  Dg Chest 2 View  Result Date: 03/18/2017 CLINICAL DATA:  Shortness of breath and cough for 2 weeks EXAM: CHEST  2 VIEW COMPARISON:  12/31/2016 FINDINGS: Cardiac shadow is enlarged but  stable. Aortic calcifications are again seen. Elevation of the right hemidiaphragm is again seen. Early infiltrate is noted in the right lung base posteriorly. No bony abnormality is seen. IMPRESSION: Early infiltrate versus atelectasis in the right lung base. Electronically Signed   By: Inez Catalina M.D.   On: 03/18/2017 17:19    ____________________________________________   PROCEDURES  Procedure(s) performed:   Procedures  None ____________________________________________   INITIAL IMPRESSION / ASSESSMENT AND PLAN / ED COURSE  Pertinent labs & imaging results that were available during my care of the patient were reviewed by me and considered in my medical decision making (see chart for details).  Patient with chronic respiratory failure and diastolic CHF presents to the emergency department with shortness of breath worsening over the past 3 days. No fever or chills. Patient with no significant volume overload on exam. She does have mild bilateral wheezing but no improvement with albuterol neb given in route. Feel that this wheezing and level of increased WOB consistent with chronic respiratory failure. On 3L home O2 here with no hypoxemia. On up to 4L O2 at home. BiPAP secured at home and patient is using it at night. Suspect bronchitis clinically. Plan for labs, CXR, and reassess. O2 sat documented at 4% near time of arrival, which is a mistake.   07:55 PM Patient with infiltrate on CXR atelectasis vs PNA. Had recent hospitalization so HCAP is a consideration. Clinically with symptoms of PNA worsening over the last 3 days so given patient's chronic respiratory issues I feel that obs admission to ensure improving symptoms is warranted. Spoke with patient's sister and caregiver at bedside as well, per patient request.   Discussed patient's case with Hospitalist, Dr. Blaine Hamper to request admission. Patient and family (if present) updated with plan. Care transferred to Hospitalist service.  I  reviewed all nursing notes, vitals, pertinent old records, EKGs, labs, imaging (as available).  ____________________________________________  FINAL CLINICAL IMPRESSION(S) / ED DIAGNOSES  Final diagnoses:  HCAP (healthcare-associated pneumonia)     MEDICATIONS GIVEN DURING THIS VISIT:  Medications  aztreonam (AZACTAM) 2 g in dextrose 5 % 50 mL IVPB (not administered)  vancomycin (VANCOCIN) IVPB 1000 mg/200 mL premix (not administered)     NEW OUTPATIENT MEDICATIONS STARTED DURING THIS VISIT:  None   Note:  This document was prepared using Dragon voice recognition software and may include unintentional dictation errors.  Nanda Quinton, MD Emergency Medicine    Mayley Lish, [facility], MD 03/18/17 [place]

## 2017-03-18 NOTE — Progress Notes (Signed)
Please call floor nurse for report at [phone]. Number [phone]

## 2017-03-18 NOTE — H&P (Signed)
History and Physical    Makayla Rodriguez IZT:[id number] DOB: [birth year] DOA: 03/18/2017  Referring MD/NP/PA:   PCP: Darcus Austin, MD   Patient coming from:  The patient is coming from home.  At baseline, pt is partially dependent for most of ADL.   Chief Complaint: shortness of breath  HPI: Makayla Rodriguez is a 83 y.o. female with medical history significant of Obesity hypoventilation syndrome, HTN, HLD,DM, OSA on BiPAP at night, pleural mass followed by pulmonary as OP, remote breast cancer, Chronic respiratory failure on 3-4 L O2 at home, who presents with worsening shortness of breath.  Patient states that she has been having shortness of breath in the past several weeks, which has worsened in the past 3 days. She has cough with white mucus production. She does not have fever, chills, chest pain. She said she does not have sore throat, but she has been having throat discomfort since she had intubation in previous admission. Patient does not have nausea, vomiting, diarrhea or abdominal pain. She states that she is constipated recently. No symptoms of UTI or unilateral weakness. She is not sure whether she has gained weight recently since she did not measure her body weight.  ED Course: pt was found to have WBC 10.3, positive troponin 0.03, bicarbonate 39, creatinine normal, pending BNP, temperature normal, no tachycardia, slightly tachypnea, oxygen saturation section 99% on 4 L nasal cannula oxygen. Chest x-ray showed right lower lobe atelectasis versus early stage infiltration. Patient is admitted to telemetry bed as inpatient.  Review of Systems:   General: no fevers, chills, has poor appetite, has fatigue HEENT: no blurry vision, hearing changes or sore throat Respiratory: has dyspnea, coughing, no wheezing CV: no chest pain, no palpitations GI: no nausea, vomiting, abdominal pain, diarrhea, has constipation GU: no dysuria, burning on urination, increased urinary frequency, hematuria    Ext: has leg edema Neuro: no unilateral weakness, numbness, or tingling, no vision change or hearing loss Skin: no rash, no skin tear. MSK: No muscle spasm, no deformity, no limitation of range of movement in spin Heme: No easy bruising.  Travel history: No recent long distant travel.  Allergy:  Allergies  Allergen Reactions  . Demerol [Meperidine] Anaphylaxis    Reaction: Hypoventilation after surgery  . Penicillins Anaphylaxis and Swelling    Has patient had a PCN reaction causing immediate rash, facial/tongue/throat swelling, SOB or lightheadedness with hypotension: Yes Has patient had a PCN reaction causing severe rash involving mucus membranes or skin necrosis: No Has patient had a PCN reaction that required hospitalization: Yes Has patient had a PCN reaction occurring within the last 10 years: No If all of the above answers are "NO", then may proceed with Cephalosporin use.   Marland Kitchen Phenergan [Promethazine Hcl] Anaphylaxis    Reaction: hypoventilation  . Codeine Nausea And Vomiting  . Macrobid [Nitrofurantoin] Nausea And Vomiting  . Relafen [Nabumetone] Itching and Swelling    Past Medical History:  Diagnosis Date  . Anxiety   . Arthritis   . CHF (congestive heart failure) ([place])    12/13 admission  . CKD (chronic kidney disease), stage III ([place])   . Complication of anesthesia    O2 sat drop during  and went into coma  . Diabetes mellitus without complication ([place])   . GERD (gastroesophageal reflux disease)   . Hypertension   . Obesity   . Obesity hypoventilation syndrome ([place])    uses O2 at home  . Seasonal allergies   . Shortness of breath  with annxiety attack  . Sleep apnea    CPAP    Past Surgical History:  Procedure Laterality Date  .  fybroid tumors    . BREAST SURGERY    . CHOLECYSTECTOMY    . EYE SURGERY     cataracts  . HERNIA REPAIR     umbilical  . PARTIAL MASTECTOMY WITH NEEDLE LOCALIZATION Left 04/05/2013   Procedure: PARTIAL MASTECTOMY WITH  NEEDLE LOCALIZATION;  Surgeon: Adin Hector, MD;  Location: [place];  Service: General;  Laterality: Left;  . PILONIDAL CYST EXCISION     buttock  . TONSILLECTOMY      Social History:  reports that she quit smoking about 36 years ago. Her smoking use included Cigarettes. She has a 20.00 pack-year smoking history. She has never used smokeless tobacco. She reports that she does not drink alcohol or use drugs.  Family History:  Family History  Problem Relation Age of Onset  . Heart failure Mother   . Gallbladder disease Father   . Pancreatic cancer Sister      Prior to Admission medications   Medication Sig Start Date End Date Taking? Authorizing Provider  acetaminophen (TYLENOL) 500 MG tablet Take 500 mg by mouth every 6 (six) hours as needed for mild pain.    Yes [provider]  albuterol (PROAIR HFA) 108 (90 Base) MCG/ACT inhaler Inhale 2 puffs into the lungs every 6 (six) hours as needed for wheezing or shortness of breath. 04/13/16  Yes Rigoberto Noel, MD  aspirin EC 81 MG tablet Take 81 mg by mouth every morning.   Yes [provider]  atorvastatin (LIPITOR) 10 MG tablet Take 10 mg by mouth at bedtime. Reported on 05/14/2015 07/22/14  Yes [provider]  azelastine (ASTELIN) 0.1 % nasal spray Place 2 sprays into both nostrils 2 (two) times daily. Use in each nostril as directed 04/16/14  Yes Ollis, [name] L, NP  cetirizine (ZYRTEC) 10 MG tablet Take 10 mg by mouth daily. Reported on 05/14/2015   Yes [provider]  cholecalciferol (VITAMIN D) 1000 UNITS tablet Take 1,000 Units by mouth every morning. Reported on 05/14/2015   Yes [provider]  Dextromethorphan-Guaifenesin (TUSSIN DM PO) Take 5 mLs by mouth as needed (for cough).    Yes [provider]  diclofenac sodium (VOLTAREN) 1 % GEL Apply 4 g topically 2 (two) times daily as needed (for joint pain). For joint pain   Yes [provider]  Dietary Management Product  (TOZAL PO) Take 3 tablets by mouth daily. For eyes   Yes [provider]  fluticasone (FLONASE) 50 MCG/ACT nasal spray Place 2 sprays into the nose 2 (two) times daily.    Yes [provider]  HUMALOG 100 UNIT/ML injection Inject 2-10 Units into the skin 2 (two) times daily before lunch and supper. Per sliding scale: 150-200 2 units, 201-250, 4 units, 251-300 8 units, 301-350 10 units, >350 call doctor 01/19/17  Yes [provider]  hydroxypropyl methylcellulose (ISOPTO TEARS) 2.5 % ophthalmic solution Place 1 drop into both eyes 3 (three) times daily as needed (for dry eyes).   Yes [provider]  LANTUS 100 UNIT/ML injection Inject 10 Units into the skin at bedtime. 01/19/17  Yes [provider]  magnesium hydroxide (MILK OF MAGNESIA) 400 MG/5ML suspension Take 15 mLs by mouth daily as needed for mild constipation.   Yes [provider]  nebivolol (BYSTOLIC) 5 MG tablet Take 5 mg by mouth daily.  05/20/12  Yes Barton Dubois, MD  Omega-3 Fatty Acids (OMEGA 3 PO) Take 1 capsule by mouth every morning.   Yes [provider]  senna (SENOKOT) 8.6 MG TABS tablet Take 1 tablet by mouth at bedtime.   Yes [provider]  sertraline (ZOLOFT) 25 MG tablet Take 3 tablets (75 mg total) by mouth daily. 12/30/16  Yes Velvet Bathe, MD  sodium chloride (OCEAN) 0.65 % SOLN nasal spray Place 1 spray into both nostrils as needed for congestion. 04/16/14  Yes Ollis, [name] L, NP  torsemide (DEMADEX) 20 MG tablet Take 3 tablets (60 mg total) by mouth daily. 12/26/16  Yes Florencia Reasons, MD  traMADol (ULTRAM) 50 MG tablet Take 50 mg by mouth 2 (two) times daily.  05/20/12  Yes Barton Dubois, MD    Physical Exam: Vitals:   03/18/17 1557 03/18/17 1630 03/18/17 1830 03/18/17 2007  BP:  (!) 179/98 140/70 121/79  Pulse:  76 70 68  Resp:  (!) 23 (!) 21 20  Temp:      TempSrc:      SpO2:  90% 99% 96%  Weight: 122.5 kg (270 lb)     Height: 5\' 3"  (1.6 m)       General: Not in acute distress HEENT:       Eyes: PERRL, EOMI, no scleral icterus.       ENT: No discharge from the ears and nose, no pharynx injection, no tonsillar enlargement.        Neck: Difficult to assess JVD due to morbid obesity, no bruit, no mass felt. Heme: No neck lymph node enlargement. Cardiac: S1/S2, RRR, No murmurs, No gallops or rubs. Respiratory: No rales, wheezing, rhonchi or rubs. GI: Soft, nondistended, nontender, no rebound pain, no organomegaly, BS present. GU: No hematuria Ext: 2+ pitting leg edema bilaterally. 2+DP/PT pulse bilaterally. Musculoskeletal: No joint deformities, No joint redness or warmth, no limitation of ROM in spin. Skin: No rashes.  Neuro: Alert, oriented X3, cranial nerves II-XII grossly intact, moves all extremities. Psych: Patient is not psychotic, no suicidal or hemocidal ideation.  Labs on Admission: I have personally reviewed following labs and imaging studies  CBC:  Recent Labs Lab 03/18/17 1635  WBC 10.3  NEUTROABS 5.7  HGB 11.3*  HCT 36.2  MCV 93.3  PLT 161   Basic Metabolic Panel:  Recent Labs Lab 03/18/17 1635  NA 140  K 3.8  CL 87*  CO2 39*  GLUCOSE 228*  BUN 38*  CREATININE 0.94  CALCIUM 9.9   GFR: Estimated Creatinine Clearance: 57.6 mL/min (by C-G formula based on SCr of 0.94 mg/dL). Liver Function Tests: No results for input(s): AST, ALT, ALKPHOS, BILITOT, PROT, ALBUMIN in the last 168 hours. No results for input(s): LIPASE, AMYLASE in the last 168 hours. No results for input(s): AMMONIA in the last 168 hours. Coagulation Profile: No results for input(s): INR, PROTIME in the last 168 hours. Cardiac Enzymes:  Recent Labs Lab 03/18/17 1635  TROPONINI 0.03*   BNP (last 3 results) No results for input(s): PROBNP in the last 8760 hours. HbA1C: No results for input(s): HGBA1C in the last 72 hours. CBG: No results for input(s): GLUCAP in the last 168 hours. Lipid Profile: No results for input(s):  CHOL, HDL, LDLCALC, TRIG, CHOLHDL, LDLDIRECT in the last 72 hours. Thyroid Function Tests: No results for input(s): TSH, T4TOTAL, FREET4, T3FREE, THYROIDAB in the last 72 hours. Anemia Panel: No results for input(s): VITAMINB12, FOLATE, FERRITIN, TIBC, IRON, RETICCTPCT in the last 72  hours. Urine analysis:    Component Value Date/Time   COLORURINE YELLOW 04/12/2014 [place] 04/12/2014 1738   LABSPEC 1.022 04/12/2014 1738   PHURINE 5.5 04/12/2014 1738   GLUCOSEU NEGATIVE 04/12/2014 1738   HGBUR SMALL (A) 04/12/2014 1738   BILIRUBINUR NEGATIVE 04/12/2014 1738   KETONESUR NEGATIVE 04/12/2014 1738   PROTEINUR 100 (A) 04/12/2014 1738   UROBILINOGEN 0.2 04/12/2014 1738   NITRITE NEGATIVE 04/12/2014 1738   LEUKOCYTESUR NEGATIVE 04/12/2014 1738   Sepsis Labs: @LABRCNTIP (procalcitonin:4,lacticidven:4) )No results found for this or any previous visit (from the past 240 hour(s)).   Radiological Exams on Admission: Dg Chest 2 View  Result Date: 03/18/2017 CLINICAL DATA:  Shortness of breath and cough for 2 weeks EXAM: CHEST  2 VIEW COMPARISON:  12/31/2016 FINDINGS: Cardiac shadow is enlarged but stable. Aortic calcifications are again seen. Elevation of the right hemidiaphragm is again seen. Early infiltrate is noted in the right lung base posteriorly. No bony abnormality is seen. IMPRESSION: Early infiltrate versus atelectasis in the right lung base. Electronically Signed   By: Inez Catalina M.D.   On: 03/18/2017 17:19     EKG: Independently reviewed.  Sinus rhythm, QTC 489, PVC, early R-wave progression, nonspecific T-wave change.   Assessment/Plan Principal Problem:   Acute on chronic respiratory failure with hypoxia and hypercapnia (HCC) Active Problems:   Essential hypertension   OSA (obstructive sleep apnea)   Obesity hypoventilation syndrome (HCC)   Morbid obesity with body mass index of 45.0-49.9 in adult [facility])   Acute exacerbation of CHF (congestive heart  failure) (HCC)   Type II diabetes mellitus with renal manifestations (HCC)   Elevated troponin   Depression   HLD (hyperlipidemia)   CKD (chronic kidney disease), stage III (HCC)   Acute on chronic respiratory failure with hypoxia and hypercapnia ([place]): etiology is not clear.Chest x-ray showed R lower lobe atelectasis versus early stage of infiltration, but patient does not have leukocytosis, fever, chest pain. Clinically patient does not seem to have pneumonia. Patient received one dose of vancomycin and aztreonam, will discontinue antibiotics now. Potential differential diagnoses is CHF exacerbation in the setting of OSA and hypoventilation syndrome. It is very difficult to assess volume status given morbid obesity, but the patient has 2+ leg edema. Chest x-ray has vascular cephalization, indicating possible CHF exacerbation. BNP is pending. Will treat pt as CHF exacerbation empirically now. Will observe patient closely, if patient develops fever, will restart antibiotics. 2-D echo on 12/19/16 showed EF of 65-70%  -will admit to tele bed as inpt. -Lasix 60 mg bid by IV -2d echo -will continue home Nebivolo, ASA -Daily weights -strict I/O's -Low salt diet -Duoneb nebs, prn albuterol nebs and prn tussinex for cough  Elevated troponin: trop is marginal posterior, 0.03. Patient does not have chest pain, possibly due to demand ischemia secondary to CHF exacerbation.  - cycle CE q6 x3 and repeat EKG in the am  - prn Nitroglycerin, tramadol for pain, and aspirin, lipitor, nebivolol - Risk factor stratification: will check FLP and A1C  - 2d echo  Essential hypertension: -On IV lasix -continue nebivolol -IV hydralazine when necessary  OSA (obstructive sleep apnea), Obesity hypoventilation syndrome (HCC) and Morbid obesity with body mass index of 45.0-49.9 in adult [facility]): -BiPAP at night  Diabetes mellitus with renal complication ([place]): Last A1c 7.7 on 12/19/16, poorly controled. Patient is  taking Lantus and Humalog at home -will decrease Lantus dose from 10-7 units daily -SSI  Depression: Stable, no suicidal or  homicidal ideations. -Continue home medications: Zoloft  HLD (hyperlipidemia): -Lipitor  CKD-III: stable. cre normal 0.94 -f/u renal function by the map   DVT ppx: SQ Lovenox Code Status: Full code Family Communication: Yes, patient's sister at bed side Disposition Plan:  Anticipate discharge back to previous home environment Consults called:  none Admission status:  Inpatient/tele  Date of Service 03/18/2017    Ivor Costa [facility] Pager [phone]  If 7PM-7AM, please contact night-coverage [email or web address] Password [email or web address] 03/18/2017, 8:44 PM

## 2017-03-19 ENCOUNTER — Inpatient Hospital Stay (HOSPITAL_COMMUNITY): Payer: Medicare Other

## 2017-03-19 ENCOUNTER — Encounter (HOSPITAL_COMMUNITY): Payer: Self-pay | Admitting: General Practice

## 2017-03-19 DIAGNOSIS — I361 Nonrheumatic tricuspid (valve) insufficiency: Secondary | ICD-10-CM

## 2017-03-19 DIAGNOSIS — I10 Essential (primary) hypertension: Secondary | ICD-10-CM

## 2017-03-19 DIAGNOSIS — F329 Major depressive disorder, single episode, unspecified: Secondary | ICD-10-CM

## 2017-03-19 DIAGNOSIS — I5032 Chronic diastolic (congestive) heart failure: Secondary | ICD-10-CM

## 2017-03-19 DIAGNOSIS — Z794 Long term (current) use of insulin: Secondary | ICD-10-CM

## 2017-03-19 DIAGNOSIS — N183 Chronic kidney disease, stage 3 (moderate): Secondary | ICD-10-CM

## 2017-03-19 DIAGNOSIS — E1122 Type 2 diabetes mellitus with diabetic chronic kidney disease: Secondary | ICD-10-CM

## 2017-03-19 LAB — BASIC METABOLIC PANEL
ANION GAP: 8 (ref 5–15)
BUN: 36 mg/dL — AB (ref 6–20)
CALCIUM: 9.4 mg/dL (ref 8.9–10.3)
CO2: 40 mmol/L — ABNORMAL HIGH (ref 22–32)
Chloride: 90 mmol/L — ABNORMAL LOW (ref 101–111)
Creatinine, Ser: 0.99 mg/dL (ref 0.44–1.00)
GFR calc Af Amer: 59 mL/min — ABNORMAL LOW (ref >60)
GFR, EST NON AFRICAN AMERICAN: 51 mL/min — AB (ref >60)
GLUCOSE: 263 mg/dL — AB (ref 65–99)
Potassium: 3.9 mmol/L (ref 3.5–5.1)
SODIUM: 138 mmol/L (ref 135–145)

## 2017-03-19 LAB — CBC
HCT: 33.9 % — ABNORMAL LOW (ref 36.0–46.0)
Hemoglobin: 10.5 g/dL — ABNORMAL LOW (ref 12.0–15.0)
MCH: 29.2 pg (ref 26.0–34.0)
MCHC: 31 g/dL (ref 30.0–36.0)
MCV: 94.2 fL (ref 78.0–100.0)
PLATELETS: 220 10*3/uL (ref 150–400)
RBC: 3.6 MIL/uL — ABNORMAL LOW (ref 3.87–5.11)
RDW: 13 % (ref 11.5–15.5)
WBC: 8.3 10*3/uL (ref 4.0–10.5)

## 2017-03-19 LAB — ECHOCARDIOGRAM COMPLETE
HEIGHTINCHES: 63 in
WEIGHTICAEL: 4130.54 [oz_av]

## 2017-03-19 LAB — BLOOD CULTURE ID PANEL (REFLEXED)
Acinetobacter baumannii: NOT DETECTED
CANDIDA GLABRATA: NOT DETECTED
CANDIDA KRUSEI: NOT DETECTED
Candida albicans: NOT DETECTED
Candida parapsilosis: NOT DETECTED
Candida tropicalis: NOT DETECTED
ENTEROBACTER CLOACAE COMPLEX: NOT DETECTED
ESCHERICHIA COLI: NOT DETECTED
Enterobacteriaceae species: NOT DETECTED
Enterococcus species: NOT DETECTED
Haemophilus influenzae: NOT DETECTED
KLEBSIELLA OXYTOCA: NOT DETECTED
Klebsiella pneumoniae: NOT DETECTED
LISTERIA MONOCYTOGENES: NOT DETECTED
Methicillin resistance: DETECTED — AB
Neisseria meningitidis: NOT DETECTED
PROTEUS SPECIES: NOT DETECTED
Pseudomonas aeruginosa: NOT DETECTED
SERRATIA MARCESCENS: NOT DETECTED
STAPHYLOCOCCUS AUREUS BCID: NOT DETECTED
STAPHYLOCOCCUS SPECIES: DETECTED — AB
STREPTOCOCCUS AGALACTIAE: NOT DETECTED
STREPTOCOCCUS PNEUMONIAE: NOT DETECTED
Streptococcus pyogenes: NOT DETECTED
Streptococcus species: NOT DETECTED

## 2017-03-19 LAB — LIPID PANEL
CHOLESTEROL: 120 mg/dL (ref 0–200)
HDL: 39 mg/dL — AB (ref >40)
LDL Cholesterol: 62 mg/dL (ref 0–99)
TRIGLYCERIDES: 93 mg/dL (ref <150)
Total CHOL/HDL Ratio: 3.1 RATIO
VLDL: 19 mg/dL (ref 0–40)

## 2017-03-19 LAB — GLUCOSE, CAPILLARY
GLUCOSE-CAPILLARY: 189 mg/dL — AB (ref 65–99)
GLUCOSE-CAPILLARY: 97 mg/dL (ref 65–99)
Glucose-Capillary: 206 mg/dL — ABNORMAL HIGH (ref 65–99)
Glucose-Capillary: 202 mg/dL — ABNORMAL HIGH (ref 65–99)

## 2017-03-19 LAB — TROPONIN I
Troponin I: <0.03 ng/mL (ref <0.03)
Troponin I: <0.03 ng/mL (ref <0.03)

## 2017-03-19 LAB — HEMOGLOBIN A1C
HEMOGLOBIN A1C: 7.7 % — AB (ref 4.8–5.6)
MEAN PLASMA GLUCOSE: 174.29 mg/dL

## 2017-03-19 MED ORDER — ARTIFICIAL TEARS OPHTHALMIC OINT
TOPICAL_OINTMENT | OPHTHALMIC | Status: DC | PRN
  Administered 2017-03-20: via OPHTHALMIC
  Filled 2017-03-19 (×2): qty 3.5

## 2017-03-19 MED ORDER — TORSEMIDE 20 MG PO TABS
60.0000 mg | ORAL_TABLET | Freq: Every day | ORAL | Status: DC
  Administered 2017-03-20: 60 mg via ORAL
  Filled 2017-03-19: qty 3

## 2017-03-19 NOTE — Progress Notes (Addendum)
[facility] PROGRESS NOTE  Makayla Rodriguez NID:[id number] DOB: 04/02/1932 DOA: 03/18/2017 PCP: Darcus Austin, MD  Interim summary history of present illness 83 y.o. female with medical history significant of Obesity hypoventilation syndrome, HTN, HLD,DM, OSA on BiPAP at night, pleural mass followed by pulmonary as OP, remote breast cancer, Chronic respiratory failure on 3-4 L O2 at home, who presents with worsening shortness of breath.  Patient states that she has been having shortness of breath in the past several weeks, which has worsened in the past 3 days. She has cough with white mucus production. She does not have fever, chills, chest pain. She said she does not have sore throat, but she has been having throat discomfort since she had intubation in previous admission. Patient does not have nausea, vomiting, diarrhea or abdominal pain.  Patient does not follow daily weights and reports not the best compliance with low-sodium diet.  Of note she has been for more than 2-3 weeks noncompliant with her BiPAP due to discomfort while wearing her mask.  Assessment/Plan:  Acute on chronic respiratory failure with hypoxia: etiology is not clear.Chest x-ray showed R lower lobe atelectasis versus early stage of infiltration, but patient does not have leukocytosis, fever, chest pain. Clinically patient does not seem to have pneumonia.  -BNP not elevated and no crackles or major volume overload on exam -will stop IV lasix and transition back to oral demadex -patient SOB most likely associated with OHS/OSA -will discussed with PCCM as she needs mask fitting to try improve compliance with BIPAP QHS. -continue heart healthy diet, daily weights and strict I's and O's -most likely home tomorrow.  Elevated troponin:  -Patient denies chest pain -Troponins negative x3 -2D echo reassuring -Do not anticipate any further inpatient workup -Continue aspirin, statins and beta-blocker  Essential  hypertension: -Continue by systolic and Demadex now -Continue heart healthy diet  OSA (obstructive sleep apnea), Obesity hypoventilation syndrome (HCC) and Morbid obesity with body mass index of 45.0-49.9 in adult [facility]): -Low calorie diet discussed with patient -Also educated on the importance with BiPAP compliance -Patient with a pending mask feeding evaluation from the pulmonary service (will contact then in the morning to see if there is anything that can be done while the patient is here reporting that he is an outpatient assessment). -For now continue on trial with different mask while inpatient.  Diabetes mellitus with renal complication [facility]): Last A1c 7.7 on 12/19/16, -Continue sliding scale insulin and Lantus -Follow A1c  Depression:  -Stable mood, no hallucinations or suicidal ideation -We will continue zoloft  HLD (hyperlipidemia): -Continue Lipitor  CKD-III:  -Stable at baseline -Follow renal function trend especially during active diuresis. -Continue low-sodium diet  Code Status: Full code Family Communication: Main Caregiver at bedside Disposition Plan: Home tomorrow.  Will transition diuretics to oral regimen and assess urine output.   Consultants:  None  Procedures:  See below x-ray reports  2D echo - Left ventricle: The cavity size was normal. There was severe   concentric hypertrophy. Systolic function was normal. The   estimated ejection fraction was in the range of 55% to 60%. There   was dynamic obstruction, with a peak velocity of 89 cm/sec and a   peak gradient of 3 mm Hg. Wall motion was normal; there were no   regional wall motion abnormalities. Doppler parameters are   consistent with abnormal left ventricular relaxation (grade 1   diastolic dysfunction). - Aortic valve: Valve mobility was restricted. There was mild   stenosis. There  was no regurgitation. Peak velocity (S): 253   cm/s. Mean gradient (S): 14 mm Hg. Valve area (VTI): 1.72  cm^2.   Valve area (Vmax): 1.58 cm^2. Valve area (Vmean): 1.68 cm^2. - Mitral valve: Mildly calcified annulus. Transvalvular velocity   was within the normal range. There was no evidence for stenosis.   There was trivial regurgitation. - Left atrium: The atrium was severely dilated. - Right ventricle: The cavity size was mildly dilated. Wall   thickness was normal. Systolic function was normal. - Tricuspid valve: There was mild regurgitation. - Pulmonary arteries: PA peak pressure: 38 mm Hg (S).  Antibiotics:  None  HPI/Subjective: Afebrile, denies chest pain and reported stable breathing.  Patient using chronic 4 L of oxygen with 97-98 % oxygen saturation.  No nausea, no vomiting.  Objective: Vitals:   03/19/17 0422 03/19/17 1416  BP: 126/65 (!) 111/59  Pulse: (!) 54 70  Resp: 20 16  Temp: (!) 97.5 F (36.4 C) 97.9 F (36.6 C)  SpO2: 98% 97%    Intake/Output Summary (Last 24 hours) at 03/19/17 1859 Last data filed at 03/19/17 1400  Gross per 24 hour  Intake              195 ml  Output              600 ml  Net             -405 ml   Filed Weights   03/18/17 1557 03/18/17 2154 03/19/17 0459  Weight: 122.5 kg (270 lb) 116.8 kg (257 lb 8 oz) 117.1 kg (258 lb 2.5 oz)    Exam:   General: Afebrile, patient denies chest pain and reports stable breathing.  Chronic hoarseness and discomfort in her throat since past intubation.  No nausea, no vomiting, no headaches, no dysuria.  Patient chronically ill in appearance, overweight and with generalized weakness/deconditioning.  Cardiovascular: S1 and S2, no rubs, no gallops, no murmurs, no appreciated JVD on exam (even difficult to assess due to body habitus).  Respiratory: Fair air movement bilaterally, positive scattered rhonchi, no wheezing, no crackles.  No use of accessory muscles.  Abdomen: Obese, soft, nontender, nondistended, positive bowel sounds  Musculoskeletal: No edema, no cyanosis, no clubbing.  Data  Reviewed: Basic Metabolic Panel:  Recent Labs Lab 03/18/17 1635 03/19/17 0220  NA 140 138  K 3.8 3.9  CL 87* 90*  CO2 39* 40*  GLUCOSE 228* 263*  BUN 38* 36*  CREATININE 0.94 0.99  CALCIUM 9.9 9.4   CBC:  Recent Labs Lab 03/18/17 1635 03/19/17 0220  WBC 10.3 8.3  NEUTROABS 5.7  --   HGB 11.3* 10.5*  HCT 36.2 33.9*  MCV 93.3 94.2  PLT 233 220   Cardiac Enzymes:  Recent Labs Lab 03/18/17 1635 03/18/17 2135 03/19/17 0220 03/19/17 0745  TROPONINI 0.03* <0.03 <0.03 <0.03   BNP (last 3 results)  Recent Labs  12/19/16 0729 03/18/17 1635  BNP 431.8* 164.4*   CBG:  Recent Labs Lab 03/18/17 2150 03/19/17 0740 03/19/17 1131 03/19/17 1713  GLUCAP 260* 189* 206* 202*    Recent Results (from the past 240 hour(s))  Blood Culture (routine x 2)     Status: None (Preliminary result)   Collection Time: 03/18/17  7:54 PM  Result Value Ref Range Status   Specimen Description BLOOD LEFT WRIST  Final   Special Requests   Final    BOTTLES DRAWN AEROBIC AND ANAEROBIC Blood Culture adequate volume   Culture  Setup  Time   Final    GRAM POSITIVE COCCI IN CLUSTERS ANAEROBIC BOTTLE ONLY Organism ID to follow Performed at [facility], [place] [address] [place],  [address]    Culture GRAM POSITIVE COCCI  Final   Report Status PENDING  Incomplete     Studies: Dg Chest 2 View  Result Date: 03/18/2017 CLINICAL DATA:  Shortness of breath and cough for 2 weeks EXAM: CHEST  2 VIEW COMPARISON:  12/31/2016 FINDINGS: Cardiac shadow is enlarged but stable. Aortic calcifications are again seen. Elevation of the right hemidiaphragm is again seen. Early infiltrate is noted in the right lung base posteriorly. No bony abnormality is seen. IMPRESSION: Early infiltrate versus atelectasis in the right lung base. Electronically Signed   By: Inez Catalina M.D.   On: 03/18/2017 17:19    Scheduled Meds: . aspirin EC  81 mg Oral q morning - 10a  . atorvastatin  10 mg Oral QHS   . azelastine  2 spray Each Nare BID  . cholecalciferol  1,000 Units Oral q morning - 10a  . enoxaparin (LOVENOX) injection  40 mg Subcutaneous QHS  . fluticasone  2 spray Each Nare BID  . Influenza vac split quadrivalent PF  0.5 mL Intramuscular Tomorrow-1000  . insulin aspart  0-9 Units Subcutaneous TID WC  . insulin glargine  7 Units Subcutaneous QHS  . ipratropium-albuterol  3 mL Nebulization TID  . loratadine  10 mg Oral Daily  . multivitamin  1 tablet Oral Daily  . nebivolol  5 mg Oral Daily  . omega-3 acid ethyl esters  1 g Oral q morning - 10a  . senna  1 tablet Oral QHS  . sertraline  75 mg Oral Daily  . sodium chloride flush  3 mL Intravenous Q12H  . [START ON 03/20/2017] torsemide  60 mg Oral Daily   Continuous Infusions: . sodium chloride      Time spent: 30 minutes   Barton Dubois  [facility] Pager [phone]. If 7PM-7AM, please contact night-coverage at [email or web address], password [facility] 03/19/2017, 6:59 PM  LOS: 1 day

## 2017-03-19 NOTE — Care Management Note (Signed)
Case Management Note  Patient Details  Name: Makayla Rodriguez MRN: [id number] Date of Birth: 28-Nov-1931  Subjective/Objective:  CHF                  Action/Plan: Discharge Planning: Pt is active with [facility] RN. Will continue to follow for dc needs.    Expected Discharge Date:                  Expected Discharge Plan:  [place]  In-House Referral:  NA  Discharge planning Services  CM Consult  Post Acute Care Choice:  Home Health, Resumption of Svcs/PTA Provider Choice offered to:  Patient  DME Arranged:    DME Agency:     HH Arranged:  RN [place] Agency:  [facility] [facility] [employer]  Status of Service:  In process, will continue to follow  If discussed at Long Length of Stay Meetings, dates discussed:    Additional Comments:  Erenest Rasher, RN 03/19/2017, 1:33 PM

## 2017-03-19 NOTE — Progress Notes (Signed)
  Echocardiogram 2D Echocardiogram has been performed.  Jennette Dubin 03/19/2017, 2:19 PM

## 2017-03-19 NOTE — Progress Notes (Signed)
PHARMACY - PHYSICIAN COMMUNICATION CRITICAL VALUE ALERT - BLOOD CULTURE IDENTIFICATION (BCID)  Results for orders placed or performed during the hospital encounter of 03/18/17  Blood Culture ID Panel (Reflexed) (Collected: 03/18/2017  7:54 PM)  Result Value Ref Range   Enterococcus species NOT DETECTED NOT DETECTED   Listeria monocytogenes NOT DETECTED NOT DETECTED   Staphylococcus species DETECTED (A) NOT DETECTED   Staphylococcus aureus NOT DETECTED NOT DETECTED   Methicillin resistance DETECTED (A) NOT DETECTED   Streptococcus species NOT DETECTED NOT DETECTED   Streptococcus agalactiae NOT DETECTED NOT DETECTED   Streptococcus pneumoniae NOT DETECTED NOT DETECTED   Streptococcus pyogenes NOT DETECTED NOT DETECTED   Acinetobacter baumannii NOT DETECTED NOT DETECTED   Enterobacteriaceae species NOT DETECTED NOT DETECTED   Enterobacter cloacae complex NOT DETECTED NOT DETECTED   Escherichia coli NOT DETECTED NOT DETECTED   Klebsiella oxytoca NOT DETECTED NOT DETECTED   Klebsiella pneumoniae NOT DETECTED NOT DETECTED   Proteus species NOT DETECTED NOT DETECTED   Serratia marcescens NOT DETECTED NOT DETECTED   Haemophilus influenzae NOT DETECTED NOT DETECTED   Neisseria meningitidis NOT DETECTED NOT DETECTED   Pseudomonas aeruginosa NOT DETECTED NOT DETECTED   Candida albicans NOT DETECTED NOT DETECTED   Candida glabrata NOT DETECTED NOT DETECTED   Candida krusei NOT DETECTED NOT DETECTED   Candida parapsilosis NOT DETECTED NOT DETECTED   Candida tropicalis NOT DETECTED NOT DETECTED   Name of physician (or Provider) Contacted: K Schorr  Changes to prescribed antibiotics required: none suggested  Minda Ditto 03/19/2017  8:26 PM

## 2017-03-20 ENCOUNTER — Other Ambulatory Visit (HOSPITAL_BASED_OUTPATIENT_CLINIC_OR_DEPARTMENT_OTHER): Payer: Medicare Other

## 2017-03-20 ENCOUNTER — Ambulatory Visit (HOSPITAL_BASED_OUTPATIENT_CLINIC_OR_DEPARTMENT_OTHER): Payer: Medicare Other | Attending: Pulmonary Disease | Admitting: Radiology

## 2017-03-20 DIAGNOSIS — J9601 Acute respiratory failure with hypoxia: Secondary | ICD-10-CM

## 2017-03-20 DIAGNOSIS — I5032 Chronic diastolic (congestive) heart failure: Secondary | ICD-10-CM

## 2017-03-20 LAB — BASIC METABOLIC PANEL
Anion gap: 8 (ref 5–15)
BUN: 44 mg/dL — ABNORMAL HIGH (ref 6–20)
CALCIUM: 9.8 mg/dL (ref 8.9–10.3)
CHLORIDE: 95 mmol/L — AB (ref 101–111)
CO2: 42 mmol/L — ABNORMAL HIGH (ref 22–32)
CREATININE: 1.09 mg/dL — AB (ref 0.44–1.00)
GFR, EST AFRICAN AMERICAN: 53 mL/min — AB (ref >60)
GFR, EST NON AFRICAN AMERICAN: 46 mL/min — AB (ref >60)
Glucose, Bld: 120 mg/dL — ABNORMAL HIGH (ref 65–99)
Potassium: 4.1 mmol/L (ref 3.5–5.1)
SODIUM: 145 mmol/L (ref 135–145)

## 2017-03-20 LAB — GLUCOSE, CAPILLARY
GLUCOSE-CAPILLARY: 117 mg/dL — AB (ref 65–99)
Glucose-Capillary: 231 mg/dL — ABNORMAL HIGH (ref 65–99)

## 2017-03-20 MED ORDER — IPRATROPIUM-ALBUTEROL 0.5-2.5 (3) MG/3ML IN SOLN
3.0000 mL | Freq: Two times a day (BID) | RESPIRATORY_TRACT | Status: DC
Start: 2017-03-20 — End: 2017-03-20

## 2017-03-20 MED ORDER — TRAMADOL HCL 50 MG PO TABS: 50.0000 mg | ORAL_TABLET | Freq: Two times a day (BID) | ORAL | Status: AC | PRN

## 2017-03-20 MED ORDER — IPRATROPIUM-ALBUTEROL 0.5-2.5 (3) MG/3ML IN SOLN: 3.0000 mL | Freq: Three times a day (TID) | RESPIRATORY_TRACT | 1 refills | Status: AC

## 2017-03-20 NOTE — Progress Notes (Signed)
Stopped by [facility] Attending MD in hall regarding plan of care for Makayla Rodriguez.  She reportedly has not been wearing her BiPAP at home despite calling the office and reporting she got a new mask & was tolerating well. While inpatient, she has been refusing BiPAP.  She previously canceled the mask desensitization appointment (suspect this is because of mobility issues).  I called the sleep center and coordinated an appointment for mask desensitization today at discharge.  Her appointment is at 11:00 AM.  Updated Dr. Dyann Kief on plan of care.  Hospital follow up arranged with Dr. Elsworth Soho.    Noe Gens, NP-C [facility] Pgr: [phone] or if no answer [phone] 03/20/2017, 8:54 AM

## 2017-03-20 NOTE — Progress Notes (Signed)
Home nebulizer ordered and to be delivered by [facility] to pt room. Medial Necessity Form filled out and printed along with Demographics sheet to be sent with pt to Sleep Center at discharge. Sleep Center to call [facility] when study completed. Marney Doctor RN,BSN,NCM [phone]

## 2017-03-20 NOTE — Procedures (Signed)
Placed patient on BiPAP for the night.  Patient is tolerating well at this time.

## 2017-03-20 NOTE — Discharge Summary (Signed)
Physician Discharge Summary  Arcola Freshour SEG:[id number] DOB: 1931/12/10 DOA: 03/18/2017  PCP: Darcus Austin, MD  Admit date: 03/18/2017 Discharge date: 03/20/2017  Time spent: 35 minutes  Recommendations for Outpatient Follow-up:  1. Repeat BMET to follow electrolytes and renal function  2. Reassess BP and adjust medications as needed 3. Check volume status and adjust diuretics    Discharge Diagnoses:  Principal Problem:   Acute respiratory failure with hypoxia (HCC) Active Problems:   Essential hypertension   OSA (obstructive sleep apnea)   Obesity hypoventilation syndrome (HCC)   Morbid obesity with body mass index of 45.0-49.9 in adult [facility])   Acute exacerbation of CHF (congestive heart failure) (HCC)   Type II diabetes mellitus with renal manifestations (HCC)   Elevated troponin   Depression   HLD (hyperlipidemia)   CKD (chronic kidney disease), stage III (HCC)   Chronic diastolic HF (heart failure) ([place]) COPD  Discharge Condition: stable and improved. Patient discharge home with instructions to follow up with PCP and with pulmonary service at discharge.  Diet recommendation: heart healthy and low calorie diet   Filed Weights   03/18/17 2154 03/19/17 0459 03/20/17 0611  Weight: 116.8 kg (257 lb 8 oz) 117.1 kg (258 lb 2.5 oz) 116.7 kg (257 lb 4.4 oz)    History of present illness:  83 y.o.femalewith medical history significant of Obesity hypoventilation syndrome, HTN, HLD,DM, OSA on BiPAP at night, pleural mass followed by pulmonary as OP, remote breast cancer, Chronic respiratory failure on 3-4L O2 at home, who presents with worsening shortness of breath. Patient states that she has been having shortness of breath in the past several weeks, which has worsened in the past 3 days. She has cough with white mucus production. She does not have fever, chills, chest pain. She said she does not have sore throat, but she has been having throat discomfort since she had  intubation in previous admission. Patient does not have nausea, vomiting, diarrhea or abdominal pain.  Patient does not follow daily weights and reports not the best compliance with low-sodium diet. Of note she has been for more than 2-3 weeks noncompliant with her BiPAP due to discomfort while wearing her mask.  Hospital Course:  Acute on chronic respiratory failure with hypoxia: etiology is not clear.Chest x-ray showed R lower lobe atelectasis versus early stage of infiltration, but patient does not have leukocytosis, fever, chest pain. Clinically patient does not seem to have pneumonia.  -BNP not elevated and no crackles or major volume overload on exam -will discharge on oral demadex (home dose) -patient SOB most likely associated with OHS/OSA -discussed with PCCM as she needs mask fitting to try improve compliance with BIPAP QHS; they have set up appointment for today at 11:00 am and will try to have it done before discharging patient; further follow up and assistance to be done during follow up as an outpatient.  -continue heart healthy diet and daily weights  -will discharge home  Elevated troponin: -Patient denies chest pain -Troponin negative x3 -2D echo reassuring -Do not anticipate any further inpatient workup -Continue aspirin, statins and beta-blocker  Essential hypertension: -Continue nevibolol and Demadex  -Continue heart healthy diet  OSA (obstructive sleep apnea), Obesity hypoventilation syndrome (HCC) and Morbid obesity with body mass index of 45.0-49.9 in adult [facility]): -Low calorie diet and need to lose weight discussed with patient -Also educated on the importance with BiPAP compliance -Patient with a pending mask fitting evaluation; after discussing with PCCM they will set things up at  11:00 am today at sleep center for mask fitting and then follow her up at discharge.   Diabetes mellitus with renal complication ([place]): -resume home hypoglycemic regimen  -A1c  7.7  Depression: -Stable mood, no hallucinations or suicidal ideation -We will continue zoloft  HLD (hyperlipidemia): -Continue Lipitor  CKD-III:  -Stable and at baseline -recommend Follow up of renal function trend at next visit. -encourage to follow low-sodium diet and to maintain adequate hydration   COPD -will discharge on duoneb -follow up with pulmonary service as an outpatient.  Procedures:  See below x-ray reports  2D echo - Left ventricle: The cavity size was normal. There was severe concentric hypertrophy. Systolic function was normal. The estimated ejection fraction was in the range of 55% to 60%. There was dynamic obstruction, with a peak velocity of 89 cm/sec and a peak gradient of 3 mm Hg. Wall motion was normal; there were no regional wall motion abnormalities. Doppler parameters are consistent with abnormal left ventricular relaxation (grade 1 diastolic dysfunction). - Aortic valve: Valve mobility was restricted. There was mild stenosis. There was no regurgitation. Peak velocity (S): 253 cm/s. Mean gradient (S): 14 mm Hg. Valve area (VTI): 1.72 cm^2. Valve area (Vmax): 1.58 cm^2. Valve area (Vmean): 1.68 cm^2. - Mitral valve: Mildly calcified annulus. Transvalvular velocity was within the normal range. There was no evidence for stenosis. There was trivial regurgitation. - Left atrium: The atrium was severely dilated. - Right ventricle: The cavity size was mildly dilated. Wall thickness was normal. Systolic function was normal. - Tricuspid valve: There was mild regurgitation. - Pulmonary arteries: PA peak pressure: 38 mm Hg (S).  Consultations:  PCCM curbside (they will help with mask desensitization process and will set up appointment as an outpatient )  Discharge Exam: Vitals:   03/20/17 0611 03/20/17 0805  BP: (!) 115/50   Pulse: (!) 55 74  Resp: 20 18  Temp: (!) 97.5 F (36.4 C)   SpO2: 95% 97%     General:  Afebrile, patient denies chest pain and reports stable breathing.  Chronic hoarseness and discomfort in her throat since past intubation.  No nausea, no vomiting, no headaches, no dysuria.  Patient chronically ill in appearance, overweight/obese and with generalized weakness/deconditioning.  Cardiovascular: S1 and S2, no rubs, no gallops, no murmurs, no appreciated JVD on exam (even difficult to assess due to body habitus).  Respiratory: improved air movement bilaterally, positive scattered rhonchi, no wheezing, no crackles.  No use of accessory muscles.  Abdomen: Obese, soft, nontender, nondistended, positive bowel sounds  Musculoskeletal: No pitting edema, no cyanosis, no clubbing.  Discharge Instructions   Discharge Instructions    Diet - low sodium heart healthy    Complete by:  As directed    Discharge instructions    Complete by:  As directed    Take medications as prescribed  Keep yourself well hydrated  Follow low sodium diet Be compliant with the use of BIPAP every night  Arrange follow up with PCP in 2 weeks Continue usage of oxygen supplementation     Current Discharge Medication List    START taking these medications   Details  ipratropium-albuterol (DUONEB) 0.5-2.5 (3) MG/3ML SOLN Take 3 mLs by nebulization 3 (three) times daily. Qty: 360 mL, Refills: 1      CONTINUE these medications which have CHANGED   Details  traMADol (ULTRAM) 50 MG tablet Take 1 tablet (50 mg total) by mouth every 12 (twelve) hours as needed (for pain.).  CONTINUE these medications which have NOT CHANGED   Details  acetaminophen (TYLENOL) 500 MG tablet Take 500 mg by mouth every 6 (six) hours as needed for mild pain.     aspirin EC 81 MG tablet Take 81 mg by mouth every morning.    atorvastatin (LIPITOR) 10 MG tablet Take 10 mg by mouth at bedtime. Reported on 05/14/2015    azelastine (ASTELIN) 0.1 % nasal spray Place 2 sprays into both nostrils 2 (two) times daily. Use in each  nostril as directed Qty: 30 mL, Refills: 3    cetirizine (ZYRTEC) 10 MG tablet Take 10 mg by mouth daily. Reported on 05/14/2015    cholecalciferol (VITAMIN D) 1000 UNITS tablet Take 1,000 Units by mouth every morning. Reported on 05/14/2015    Dextromethorphan-Guaifenesin (TUSSIN DM PO) Take 5 mLs by mouth as needed (for cough).     diclofenac sodium (VOLTAREN) 1 % GEL Apply 4 g topically 2 (two) times daily as needed (for joint pain). For joint pain    Dietary Management Product (TOZAL PO) Take 3 tablets by mouth daily. For eyes    fluticasone (FLONASE) 50 MCG/ACT nasal spray Place 2 sprays into the nose 2 (two) times daily.     HUMALOG 100 UNIT/ML injection Inject 2-10 Units into the skin 2 (two) times daily before lunch and supper. Per sliding scale: 150-200 2 units, 201-250, 4 units, 251-300 8 units, 301-350 10 units, >350 call doctor    hydroxypropyl methylcellulose (ISOPTO TEARS) 2.5 % ophthalmic solution Place 1 drop into both eyes 3 (three) times daily as needed (for dry eyes).    LANTUS 100 UNIT/ML injection Inject 10 Units into the skin at bedtime.    magnesium hydroxide (MILK OF MAGNESIA) 400 MG/5ML suspension Take 15 mLs by mouth daily as needed for mild constipation.    nebivolol (BYSTOLIC) 5 MG tablet Take 5 mg by mouth daily.    Omega-3 Fatty Acids (OMEGA 3 PO) Take 1 capsule by mouth every morning.    senna (SENOKOT) 8.6 MG TABS tablet Take 1 tablet by mouth at bedtime.    sertraline (ZOLOFT) 25 MG tablet Take 3 tablets (75 mg total) by mouth daily.    sodium chloride (OCEAN) 0.65 % SOLN nasal spray Place 1 spray into both nostrils as needed for congestion. Qty: 1 Bottle, Refills: 0    torsemide (DEMADEX) 20 MG tablet Take 3 tablets (60 mg total) by mouth daily. Qty: 90 tablet, Refills: 0      STOP taking these medications     albuterol (PROAIR HFA) 108 (90 Base) MCG/ACT inhaler        Allergies  Allergen Reactions  . Demerol [Meperidine] Anaphylaxis     Reaction: Hypoventilation after surgery  . Penicillins Anaphylaxis and Swelling    Has patient had a PCN reaction causing immediate rash, facial/tongue/throat swelling, SOB or lightheadedness with hypotension: Yes Has patient had a PCN reaction causing severe rash involving mucus membranes or skin necrosis: No Has patient had a PCN reaction that required hospitalization: Yes Has patient had a PCN reaction occurring within the last 10 years: No If all of the above answers are "NO", then may proceed with Cephalosporin use.   Marland Kitchen Phenergan [Promethazine Hcl] Anaphylaxis    Reaction: hypoventilation  . Codeine Nausea And Vomiting  . Macrobid [Nitrofurantoin] Nausea And Vomiting  . Relafen [Nabumetone] Itching and Swelling   Follow-up Information    Rigoberto Noel, MD Follow up on 04/25/2017.   Specialty:  Pulmonary Disease Why:  Appt at 3:00 PM.  Arrive at 2:45 for check in.  Contact information: [address] [place] [address] [phone]           The results of significant diagnostics from this hospitalization (including imaging, microbiology, ancillary and laboratory) are listed below for reference.    Significant Diagnostic Studies: Dg Chest 2 View  Result Date: 03/18/2017 CLINICAL DATA:  Shortness of breath and cough for 2 weeks EXAM: CHEST  2 VIEW COMPARISON:  12/31/2016 FINDINGS: Cardiac shadow is enlarged but stable. Aortic calcifications are again seen. Elevation of the right hemidiaphragm is again seen. Early infiltrate is noted in the right lung base posteriorly. No bony abnormality is seen. IMPRESSION: Early infiltrate versus atelectasis in the right lung base. Electronically Signed   By: Inez Catalina M.D.   On: 03/18/2017 17:19    Microbiology: Recent Results (from the past 240 hour(s))  Blood Culture (routine x 2)     Status: Abnormal (Preliminary result)   Collection Time: 03/18/17  7:54 PM  Result Value Ref Range Status   Specimen Description BLOOD LEFT  WRIST  Final   Special Requests   Final    BOTTLES DRAWN AEROBIC AND ANAEROBIC Blood Culture adequate volume   Culture  Setup Time   Final    GRAM POSITIVE COCCI IN CLUSTERS IN BOTH AEROBIC AND ANAEROBIC BOTTLES CRITICAL RESULT CALLED TO, READ BACK BY AND VERIFIED WITH: PHARMD D GREEN [phone] MLM    Culture (A)  Final    STAPHYLOCOCCUS SPECIES (COAGULASE NEGATIVE) THE SIGNIFICANCE OF ISOLATING THIS ORGANISM FROM A SINGLE SET OF BLOOD CULTURES WHEN MULTIPLE SETS ARE DRAWN IS UNCERTAIN. PLEASE NOTIFY THE MICROBIOLOGY DEPARTMENT WITHIN ONE WEEK IF SPECIATION AND SENSITIVITIES ARE REQUIRED. Performed at [facility], [place] [address] [place], [place] [address]    Report Status PENDING  Incomplete  Blood Culture (routine x 2)     Status: None (Preliminary result)   Collection Time: 03/18/17  7:54 PM  Result Value Ref Range Status   Specimen Description BLOOD LEFT ANTECUBITAL  Final   Special Requests   Final    BOTTLES DRAWN AEROBIC AND ANAEROBIC Blood Culture adequate volume   Culture   Final    NO GROWTH 1 DAY Performed at [facility], [place] [address] [place], [place] [address]    Report Status PENDING  Incomplete  Blood Culture ID Panel (Reflexed)     Status: Abnormal   Collection Time: 03/18/17  7:54 PM  Result Value Ref Range Status   Enterococcus species NOT DETECTED NOT DETECTED Final   Listeria monocytogenes NOT DETECTED NOT DETECTED Final   Staphylococcus species DETECTED (A) NOT DETECTED Final    Comment: Methicillin (oxacillin) resistant coagulase negative staphylococcus. Possible blood culture contaminant (unless isolated from more than one blood culture draw or clinical case suggests pathogenicity). No antibiotic treatment is indicated for blood  culture contaminants. CRITICAL RESULT CALLED TO, READ BACK BY AND VERIFIED WITH: PHARMD GREEN [phone] MLM    Staphylococcus aureus NOT DETECTED NOT DETECTED Final   Methicillin resistance DETECTED (A) NOT  DETECTED Final    Comment: CRITICAL RESULT CALLED TO, READ BACK BY AND VERIFIED WITH: PHARMD GREEN [phone] MLM    Streptococcus species NOT DETECTED NOT DETECTED Final   Streptococcus agalactiae NOT DETECTED NOT DETECTED Final   Streptococcus pneumoniae NOT DETECTED NOT DETECTED Final   Streptococcus pyogenes NOT DETECTED NOT DETECTED Final   Acinetobacter baumannii NOT DETECTED NOT DETECTED Final  Enterobacteriaceae species NOT DETECTED NOT DETECTED Final   Enterobacter cloacae complex NOT DETECTED NOT DETECTED Final   Escherichia coli NOT DETECTED NOT DETECTED Final   Klebsiella oxytoca NOT DETECTED NOT DETECTED Final   Klebsiella pneumoniae NOT DETECTED NOT DETECTED Final   Proteus species NOT DETECTED NOT DETECTED Final   Serratia marcescens NOT DETECTED NOT DETECTED Final   Haemophilus influenzae NOT DETECTED NOT DETECTED Final   Neisseria meningitidis NOT DETECTED NOT DETECTED Final   Pseudomonas aeruginosa NOT DETECTED NOT DETECTED Final   Candida albicans NOT DETECTED NOT DETECTED Final   Candida glabrata NOT DETECTED NOT DETECTED Final   Candida krusei NOT DETECTED NOT DETECTED Final   Candida parapsilosis NOT DETECTED NOT DETECTED Final   Candida tropicalis NOT DETECTED NOT DETECTED Final    Comment: Performed at [facility], [place] [address] [place], [place] [address]     Labs: Basic Metabolic Panel:  Recent Labs Lab 03/18/17 1635 03/19/17 0220 03/20/17 0520  NA 140 138 145  K 3.8 3.9 4.1  CL 87* 90* 95*  CO2 39* 40* 42*  GLUCOSE 228* 263* 120*  BUN 38* 36* 44*  CREATININE 0.94 0.99 1.09*  CALCIUM 9.9 9.4 9.8   CBC:  Recent Labs Lab 03/18/17 1635 03/19/17 0220  WBC 10.3 8.3  NEUTROABS 5.7  --   HGB 11.3* 10.5*  HCT 36.2 33.9*  MCV 93.3 94.2  PLT 233 220   Cardiac Enzymes:  Recent Labs Lab 03/18/17 1635 03/18/17 2135 03/19/17 0220 03/19/17 0745  TROPONINI 0.03* <0.03 <0.03 <0.03   BNP: BNP (last 3 results)  Recent Labs   12/19/16 0729 03/18/17 1635  BNP 431.8* 164.4*    CBG:  Recent Labs Lab 03/19/17 0740 03/19/17 1131 03/19/17 1713 03/19/17 2039 03/20/17 0726  GLUCAP 189* 206* 202* 97 117*    Signed:  Barton Dubois MD.  [facility] 03/20/2017, 10:47 AM

## 2017-03-21 ENCOUNTER — Telehealth: Payer: Self-pay | Admitting: Pulmonary Disease

## 2017-03-21 LAB — CULTURE, BLOOD (ROUTINE X 2): Special Requests: ADEQUATE

## 2017-03-21 NOTE — Telephone Encounter (Signed)
ATC patient. VM was full at the time. Will call back later.

## 2017-03-23 NOTE — Telephone Encounter (Signed)
Called and spoke with pt and she stated that her new bipap is working well.  She stated that the new nebulizer is working but she needs to have more moisture in the air.  She is wanting to know if RA has any further recs for her.  RA please advise. Thanks

## 2017-03-23 NOTE — Telephone Encounter (Signed)
Glad to hear that her BiPAP is working well. No further recommendations.

## 2017-03-24 DIAGNOSIS — I10 Essential (primary) hypertension: Secondary | ICD-10-CM | POA: Diagnosis not present

## 2017-03-24 DIAGNOSIS — N183 Chronic kidney disease, stage 3 (moderate): Secondary | ICD-10-CM | POA: Diagnosis not present

## 2017-03-24 DIAGNOSIS — E1121 Type 2 diabetes mellitus with diabetic nephropathy: Secondary | ICD-10-CM | POA: Diagnosis not present

## 2017-03-24 DIAGNOSIS — J449 Chronic obstructive pulmonary disease, unspecified: Secondary | ICD-10-CM | POA: Diagnosis not present

## 2017-03-24 DIAGNOSIS — I5030 Unspecified diastolic (congestive) heart failure: Secondary | ICD-10-CM | POA: Diagnosis not present

## 2017-03-24 DIAGNOSIS — E1165 Type 2 diabetes mellitus with hyperglycemia: Secondary | ICD-10-CM | POA: Diagnosis not present

## 2017-03-24 DIAGNOSIS — M179 Osteoarthritis of knee, unspecified: Secondary | ICD-10-CM | POA: Diagnosis not present

## 2017-03-24 LAB — CULTURE, BLOOD (ROUTINE X 2)
CULTURE: NO GROWTH
SPECIAL REQUESTS: ADEQUATE

## 2017-03-24 NOTE — Telephone Encounter (Signed)
Spoke with the pt and notified of recs per RA  Nothing further needed

## 2017-03-29 ENCOUNTER — Other Ambulatory Visit: Payer: Self-pay

## 2017-03-29 NOTE — Patient Outreach (Signed)
[facility]) Care Management  03/29/2017  Makayla Rodriguez July 11, 1931 [id number]  TELEPHONE SCREENING Referral date: 03/22/17 Referral source: primary MD  Referral reason: transportation assistance Insurance: medicare  SUBJECTIVE: Telephone call to patient regarding primary MD referral. HIPAA verified with patient. Discussed [facility] care management services with patient. Patient states she is having difficulty getting into her wheelchair to get out to appointments.  Patient states she has had to call 911 in the past. Patient states she has been in and out of the hospital. Patient states she does not want to go through getting in and out of the wheelchair to get to the doctors. Patient states it makes her very anxious because she is afraid of falling. Patient state she hurts herself when she is trying to get into the wheelchair. Patient states it takes an assist of 2 people to help her get to her wheelchair and doctors office. Patient reports she has CNA's around the clock.  Patient states she is unable to complete this call and  request RNCM call her back on another day to complete this screening.   PLAN: RNCM will attempt 2nd telephone call to patient within 3 business days.   Quinn Plowman RN,BSN,CCM [facility] Telephonic  [phone]

## 2017-03-30 ENCOUNTER — Other Ambulatory Visit: Payer: Self-pay

## 2017-03-30 NOTE — Patient Outreach (Signed)
[facility]) Care Management  03/30/2017  Cynitha Berte 02/09/1932 [id number]  TELEPHONE SCREENING Referral date: 03/22/17 Referral source: primary MD  Referral reason: transportation assistance Insurance: medicare Attempt #1  Telephone call to patient to complete screening. Unable to reach patient or leave voice message due to patients mailbox being full and not accepting messages.   PLAN: RNCM will attempt 2nd telephone call to patient within 3 business days.   Quinn Plowman RN,BSN,CCM [facility] Telephonic  [phone]

## 2017-03-31 ENCOUNTER — Other Ambulatory Visit: Payer: Self-pay

## 2017-03-31 ENCOUNTER — Ambulatory Visit: Payer: Self-pay

## 2017-03-31 NOTE — Patient Outreach (Signed)
[facility]) Care Management  03/31/2017  Makayla Rodriguez 1932-02-09 [id number]   TELEPHONE SCREENING Referral date: 03/22/17 Referral source: primary MD  Referral reason: transportation assistance Insurance: medicare Attempt #2  Second telephone call to patient to complete screening. Unable to reach patient or leave voice message due to patients mailbox being full and not accepting messages.   PLAN: RNCM will attempt 3rd telephone call to patient within 3 business days.   Quinn Plowman RN,BSN,CCM [facility] Telephonic  [phone]

## 2017-04-03 ENCOUNTER — Ambulatory Visit: Payer: Self-pay

## 2017-04-11 ENCOUNTER — Other Ambulatory Visit: Payer: Self-pay

## 2017-04-11 NOTE — Patient Outreach (Signed)
[facility]) Care Management  04/11/2017  Makayla Rodriguez Jan 21, 1932 [id number]  TELEPHONE SCREENING Referral date: 03/22/17 Referral source: primary MD  Referral reason: transportation assistance Insurance: medicare  TELEPHONE SCREENING Referral date: 03/22/17 Referral source: primary MD  Referral reason: transportation assistance Insurance: medicare Attempt #3  Second telephone call to patient to complete screening. Unable to reach patient or leave voice message due to patients mailbox being full and not accepting messages.   PLAN: RNCM will send patient outreach letter to attempt contact.    Quinn Plowman RN,BSN,CCM [facility] Telephonic  [phone]

## 2017-04-19 DIAGNOSIS — J449 Chronic obstructive pulmonary disease, unspecified: Secondary | ICD-10-CM | POA: Diagnosis not present

## 2017-04-19 DIAGNOSIS — I5030 Unspecified diastolic (congestive) heart failure: Secondary | ICD-10-CM | POA: Diagnosis not present

## 2017-04-19 DIAGNOSIS — M179 Osteoarthritis of knee, unspecified: Secondary | ICD-10-CM | POA: Diagnosis not present

## 2017-04-19 DIAGNOSIS — Z794 Long term (current) use of insulin: Secondary | ICD-10-CM | POA: Diagnosis not present

## 2017-04-19 DIAGNOSIS — I10 Essential (primary) hypertension: Secondary | ICD-10-CM | POA: Diagnosis not present

## 2017-04-19 DIAGNOSIS — E1165 Type 2 diabetes mellitus with hyperglycemia: Secondary | ICD-10-CM | POA: Diagnosis not present

## 2017-04-19 DIAGNOSIS — N183 Chronic kidney disease, stage 3 (moderate): Secondary | ICD-10-CM | POA: Diagnosis not present

## 2017-04-19 DIAGNOSIS — E1121 Type 2 diabetes mellitus with diabetic nephropathy: Secondary | ICD-10-CM | POA: Diagnosis not present

## 2017-04-25 ENCOUNTER — Ambulatory Visit: Payer: Medicare Other | Admitting: Pulmonary Disease

## 2017-04-25 ENCOUNTER — Ambulatory Visit (INDEPENDENT_AMBULATORY_CARE_PROVIDER_SITE_OTHER): Payer: Medicare Other | Admitting: Pulmonary Disease

## 2017-04-25 ENCOUNTER — Encounter: Payer: Self-pay | Admitting: Pulmonary Disease

## 2017-04-25 VITALS — BP 118/58 | HR 57 | Ht 63.0 in | Wt 265.0 lb

## 2017-04-25 DIAGNOSIS — G4733 Obstructive sleep apnea (adult) (pediatric): Secondary | ICD-10-CM | POA: Diagnosis not present

## 2017-04-25 DIAGNOSIS — E662 Morbid (severe) obesity with alveolar hypoventilation: Secondary | ICD-10-CM | POA: Diagnosis not present

## 2017-04-25 DIAGNOSIS — Z23 Encounter for immunization: Secondary | ICD-10-CM

## 2017-04-25 NOTE — Progress Notes (Signed)
   Subjective:    Patient ID: Makayla Rodriguez, female    DOB: 05/05/1932, 83 y.o.   MRN: [id number]  HPI  83 y.o [place] female with OSA/ OHS for FU.  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 She was readmitted 02/2017 and treated for HCAP although chest x-ray is not convincing.  Her lymphedema is doing better, her mobility is very poor and she remains mostly in a wheelchair now  Oxygen saturation was 96% on 3 L     Significant tests/ events  Admitted 04-2012 with mild resp acidosis &CXR s/o edema. She was aggressive diuresed -about 15L and Placed on 24 h O2. She has OHS by ABGs with a PH of 7.4 and PCO2 of 77 and bicarb of 47.4. She has remained O2 dependent.    PSG 06/2012 >>AHI 98.7, SpO2 low 58%. Study done with 2 liters oxygen.   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   Also denies presyncope, palpitations, heartburn, abdominal pain, nausea, vomiting, diarrhea or change in bowel or urinary habits, dysuria,hematuria, rash, arthralgias, visual complaints,  headache, numbness weakness or ataxia.     Objective:   Physical Exam  Gen. Pleasant, obese, in no distress ENT - no lesions, no post nasal drip Neck: No JVD, no thyromegaly, no carotid bruits Lungs: no use of accessory muscles, no dullness to percussion, decreased without rales or rhonchi  Cardiovascular: Rhythm regular, heart sounds  normal, no murmurs or gallops, 2+ peripheral edema Musculoskeletal: No deformities, no cyanosis or clubbing , no tremors       Assessment & Plan:

## 2017-04-25 NOTE — Assessment & Plan Note (Signed)
Okay to stay on 3 L oxygen daytime. Check nocturnal oximetry on BiPAP and 3 L oxygen -based on this we will advise you whether okay to cut down to 3 L at night

## 2017-04-25 NOTE — Patient Instructions (Signed)
Okay to stay on 3 L oxygen daytime. Check nocturnal oximetry on BiPAP and 3 L oxygen -based on this we will advise you whether okay to cut down to 3 L at night

## 2017-04-25 NOTE — Assessment & Plan Note (Signed)
Ct bipap -obtain download  Weight loss encouraged, compliance with goal of at least 4-6 hrs every night is the expectation. Advised against medications with sedative side effects Cautioned against driving when sleepy - understanding that sleepiness will vary on a day to day basis

## 2017-04-26 ENCOUNTER — Other Ambulatory Visit: Payer: Self-pay

## 2017-04-26 NOTE — Patient Outreach (Signed)
[facility]) Care Management  04/26/2017  Makayla Rodriguez 1931/08/13 [id number]   Case closure: No response from patient after 3 telephone attempts and outreach letter.  PLAN; RNCM will refer patient to care management assistant to close due to being unable to contact.  RNCM will notify patients primary MD of closure.    Quinn Plowman RN,BSN,CCM [facility] Telephonic  [phone]

## 2017-05-04 DIAGNOSIS — E662 Morbid (severe) obesity with alveolar hypoventilation: Secondary | ICD-10-CM | POA: Diagnosis not present

## 2017-05-04 DIAGNOSIS — I10 Essential (primary) hypertension: Secondary | ICD-10-CM | POA: Diagnosis not present

## 2017-05-04 DIAGNOSIS — E1165 Type 2 diabetes mellitus with hyperglycemia: Secondary | ICD-10-CM | POA: Diagnosis not present

## 2017-05-04 DIAGNOSIS — F419 Anxiety disorder, unspecified: Secondary | ICD-10-CM | POA: Diagnosis not present

## 2017-05-04 DIAGNOSIS — E1121 Type 2 diabetes mellitus with diabetic nephropathy: Secondary | ICD-10-CM | POA: Diagnosis not present

## 2017-05-04 DIAGNOSIS — I5030 Unspecified diastolic (congestive) heart failure: Secondary | ICD-10-CM | POA: Diagnosis not present

## 2017-05-12 DIAGNOSIS — J449 Chronic obstructive pulmonary disease, unspecified: Secondary | ICD-10-CM | POA: Diagnosis not present

## 2017-05-12 DIAGNOSIS — I5030 Unspecified diastolic (congestive) heart failure: Secondary | ICD-10-CM | POA: Diagnosis not present

## 2017-05-12 DIAGNOSIS — E1165 Type 2 diabetes mellitus with hyperglycemia: Secondary | ICD-10-CM | POA: Diagnosis not present

## 2017-05-12 DIAGNOSIS — N183 Chronic kidney disease, stage 3 (moderate): Secondary | ICD-10-CM | POA: Diagnosis not present

## 2017-05-12 DIAGNOSIS — Z794 Long term (current) use of insulin: Secondary | ICD-10-CM | POA: Diagnosis not present

## 2017-05-12 DIAGNOSIS — E1121 Type 2 diabetes mellitus with diabetic nephropathy: Secondary | ICD-10-CM | POA: Diagnosis not present

## 2017-05-12 DIAGNOSIS — M179 Osteoarthritis of knee, unspecified: Secondary | ICD-10-CM | POA: Diagnosis not present

## 2017-05-12 DIAGNOSIS — I10 Essential (primary) hypertension: Secondary | ICD-10-CM | POA: Diagnosis not present

## 2017-06-02 ENCOUNTER — Inpatient Hospital Stay (HOSPITAL_COMMUNITY)
Admission: EM | Admit: 2017-06-02 | Discharge: 2017-06-06 | DRG: 205 | Disposition: A | Discharge status: Home / Self Care | Payer: Medicare Other | Attending: Internal Medicine | Admitting: Internal Medicine

## 2017-06-02 ENCOUNTER — Encounter (HOSPITAL_COMMUNITY): Payer: Self-pay | Admitting: Internal Medicine

## 2017-06-02 ENCOUNTER — Emergency Department (HOSPITAL_COMMUNITY): Payer: Medicare Other

## 2017-06-02 DIAGNOSIS — E662 Morbid (severe) obesity with alveolar hypoventilation: Principal | ICD-10-CM | POA: Diagnosis present

## 2017-06-02 DIAGNOSIS — Z9981 Dependence on supplemental oxygen: Secondary | ICD-10-CM

## 2017-06-02 DIAGNOSIS — Z79899 Other long term (current) drug therapy: Secondary | ICD-10-CM

## 2017-06-02 DIAGNOSIS — I5033 Acute on chronic diastolic (congestive) heart failure: Secondary | ICD-10-CM | POA: Diagnosis present

## 2017-06-02 DIAGNOSIS — Z800 Family history of malignant neoplasm of digestive organs: Secondary | ICD-10-CM

## 2017-06-02 DIAGNOSIS — Z9849 Cataract extraction status, unspecified eye: Secondary | ICD-10-CM

## 2017-06-02 DIAGNOSIS — E1129 Type 2 diabetes mellitus with other diabetic kidney complication: Secondary | ICD-10-CM | POA: Diagnosis present

## 2017-06-02 DIAGNOSIS — K219 Gastro-esophageal reflux disease without esophagitis: Secondary | ICD-10-CM | POA: Diagnosis present

## 2017-06-02 DIAGNOSIS — J9600 Acute respiratory failure, unspecified whether with hypoxia or hypercapnia: Secondary | ICD-10-CM | POA: Diagnosis not present

## 2017-06-02 DIAGNOSIS — Z888 Allergy status to other drugs, medicaments and biological substances status: Secondary | ICD-10-CM | POA: Diagnosis not present

## 2017-06-02 DIAGNOSIS — Z8249 Family history of ischemic heart disease and other diseases of the circulatory system: Secondary | ICD-10-CM

## 2017-06-02 DIAGNOSIS — J9811 Atelectasis: Secondary | ICD-10-CM | POA: Diagnosis present

## 2017-06-02 DIAGNOSIS — R0902 Hypoxemia: Secondary | ICD-10-CM | POA: Diagnosis not present

## 2017-06-02 DIAGNOSIS — E1122 Type 2 diabetes mellitus with diabetic chronic kidney disease: Secondary | ICD-10-CM | POA: Diagnosis not present

## 2017-06-02 DIAGNOSIS — I5040 Unspecified combined systolic (congestive) and diastolic (congestive) heart failure: Secondary | ICD-10-CM | POA: Diagnosis not present

## 2017-06-02 DIAGNOSIS — M19079 Primary osteoarthritis, unspecified ankle and foot: Secondary | ICD-10-CM | POA: Diagnosis present

## 2017-06-02 DIAGNOSIS — F329 Major depressive disorder, single episode, unspecified: Secondary | ICD-10-CM | POA: Diagnosis present

## 2017-06-02 DIAGNOSIS — Z79891 Long term (current) use of opiate analgesic: Secondary | ICD-10-CM

## 2017-06-02 DIAGNOSIS — F419 Anxiety disorder, unspecified: Secondary | ICD-10-CM | POA: Diagnosis present

## 2017-06-02 DIAGNOSIS — I130 Hypertensive heart and chronic kidney disease with heart failure and stage 1 through stage 4 chronic kidney disease, or unspecified chronic kidney disease: Secondary | ICD-10-CM | POA: Diagnosis present

## 2017-06-02 DIAGNOSIS — Z885 Allergy status to narcotic agent status: Secondary | ICD-10-CM | POA: Diagnosis not present

## 2017-06-02 DIAGNOSIS — Z9012 Acquired absence of left breast and nipple: Secondary | ICD-10-CM

## 2017-06-02 DIAGNOSIS — J9621 Acute and chronic respiratory failure with hypoxia: Secondary | ICD-10-CM | POA: Diagnosis present

## 2017-06-02 DIAGNOSIS — C50419 Malignant neoplasm of upper-outer quadrant of unspecified female breast: Secondary | ICD-10-CM | POA: Diagnosis present

## 2017-06-02 DIAGNOSIS — J9622 Acute and chronic respiratory failure with hypercapnia: Secondary | ICD-10-CM | POA: Diagnosis present

## 2017-06-02 DIAGNOSIS — Z6841 Body Mass Index (BMI) 40.0 and over, adult: Secondary | ICD-10-CM | POA: Diagnosis not present

## 2017-06-02 DIAGNOSIS — Z9049 Acquired absence of other specified parts of digestive tract: Secondary | ICD-10-CM

## 2017-06-02 DIAGNOSIS — Z794 Long term (current) use of insulin: Secondary | ICD-10-CM

## 2017-06-02 DIAGNOSIS — R531 Weakness: Secondary | ICD-10-CM | POA: Diagnosis not present

## 2017-06-02 DIAGNOSIS — R0602 Shortness of breath: Secondary | ICD-10-CM | POA: Diagnosis not present

## 2017-06-02 DIAGNOSIS — M1990 Unspecified osteoarthritis, unspecified site: Secondary | ICD-10-CM | POA: Diagnosis present

## 2017-06-02 DIAGNOSIS — Z87891 Personal history of nicotine dependence: Secondary | ICD-10-CM

## 2017-06-02 DIAGNOSIS — E785 Hyperlipidemia, unspecified: Secondary | ICD-10-CM | POA: Diagnosis present

## 2017-06-02 DIAGNOSIS — Z880 Allergy status to penicillin: Secondary | ICD-10-CM | POA: Diagnosis not present

## 2017-06-02 DIAGNOSIS — Z7982 Long term (current) use of aspirin: Secondary | ICD-10-CM

## 2017-06-02 DIAGNOSIS — Z9119 Patient's noncompliance with other medical treatment and regimen: Secondary | ICD-10-CM

## 2017-06-02 DIAGNOSIS — R0682 Tachypnea, not elsewhere classified: Secondary | ICD-10-CM | POA: Diagnosis not present

## 2017-06-02 DIAGNOSIS — I10 Essential (primary) hypertension: Secondary | ICD-10-CM | POA: Diagnosis present

## 2017-06-02 DIAGNOSIS — N183 Chronic kidney disease, stage 3 (moderate): Secondary | ICD-10-CM | POA: Diagnosis present

## 2017-06-02 DIAGNOSIS — F32A Depression, unspecified: Secondary | ICD-10-CM | POA: Diagnosis present

## 2017-06-02 DIAGNOSIS — G4733 Obstructive sleep apnea (adult) (pediatric): Secondary | ICD-10-CM | POA: Diagnosis not present

## 2017-06-02 LAB — COMPREHENSIVE METABOLIC PANEL
ALK PHOS: 47 U/L (ref 38–126)
ALT: 11 U/L — AB (ref 14–54)
ANION GAP: 10 (ref 5–15)
AST: 16 U/L (ref 15–41)
Albumin: 3.7 g/dL (ref 3.5–5.0)
BUN: 53 mg/dL — ABNORMAL HIGH (ref 6–20)
CALCIUM: 10.2 mg/dL (ref 8.9–10.3)
CHLORIDE: 90 mmol/L — AB (ref 101–111)
CO2: 43 mmol/L — ABNORMAL HIGH (ref 22–32)
CREATININE: 0.99 mg/dL (ref 0.44–1.00)
GFR, EST AFRICAN AMERICAN: 59 mL/min — AB (ref >60)
GFR, EST NON AFRICAN AMERICAN: 51 mL/min — AB (ref >60)
Glucose, Bld: 139 mg/dL — ABNORMAL HIGH (ref 65–99)
Potassium: 4.2 mmol/L (ref 3.5–5.1)
Sodium: 143 mmol/L (ref 135–145)
Total Bilirubin: 0.6 mg/dL (ref 0.3–1.2)
Total Protein: 8 g/dL (ref 6.5–8.1)

## 2017-06-02 LAB — CBC WITH DIFFERENTIAL/PLATELET
BASOS PCT: 0 %
Basophils Absolute: 0 10*3/uL (ref 0.0–0.1)
EOS ABS: 0.2 10*3/uL (ref 0.0–0.7)
Eosinophils Relative: 2 %
HCT: 42.1 % (ref 36.0–46.0)
HEMOGLOBIN: 12.8 g/dL (ref 12.0–15.0)
Lymphocytes Relative: 29 %
Lymphs Abs: 2.9 10*3/uL (ref 0.7–4.0)
MCH: 29.8 pg (ref 26.0–34.0)
MCHC: 30.4 g/dL (ref 30.0–36.0)
MCV: 98.1 fL (ref 78.0–100.0)
Monocytes Absolute: 0.9 10*3/uL (ref 0.1–1.0)
Monocytes Relative: 9 %
NEUTROS PCT: 60 %
Neutro Abs: 6.2 10*3/uL (ref 1.7–7.7)
Platelets: 214 10*3/uL (ref 150–400)
RBC: 4.29 MIL/uL (ref 3.87–5.11)
RDW: 12.6 % (ref 11.5–15.5)
WBC: 10.1 10*3/uL (ref 4.0–10.5)

## 2017-06-02 LAB — I-STAT TROPONIN, ED: TROPONIN I, POC: 0.02 ng/mL (ref 0.00–0.08)

## 2017-06-02 LAB — BRAIN NATRIURETIC PEPTIDE: B NATRIURETIC PEPTIDE 5: 259.3 pg/mL — AB (ref 0.0–100.0)

## 2017-06-02 LAB — I-STAT CG4 LACTIC ACID, ED: LACTIC ACID, VENOUS: 0.82 mmol/L (ref 0.5–1.9)

## 2017-06-02 MED ORDER — SODIUM CHLORIDE 0.9% FLUSH
3.0000 mL | Freq: Two times a day (BID) | INTRAVENOUS | Status: DC
  Administered 2017-06-03 – 2017-06-06 (×6): 3 mL via INTRAVENOUS

## 2017-06-02 MED ORDER — ASPIRIN EC 81 MG PO TBEC
81.0000 mg | DELAYED_RELEASE_TABLET | Freq: Every morning | ORAL | Status: DC
  Administered 2017-06-03 – 2017-06-06 (×4): 81 mg via ORAL
  Filled 2017-06-02 (×4): qty 1

## 2017-06-02 MED ORDER — MAGNESIUM HYDROXIDE 400 MG/5ML PO SUSP
15.0000 mL | Freq: Every day | ORAL | Status: DC | PRN
  Administered 2017-06-06: 15 mL via ORAL
  Filled 2017-06-02: qty 30

## 2017-06-02 MED ORDER — INSULIN GLARGINE 100 UNIT/ML ~~LOC~~ SOLN
10.0000 [IU] | Freq: Every day | SUBCUTANEOUS | Status: DC
  Administered 2017-06-03 – 2017-06-05 (×4): 10 [IU] via SUBCUTANEOUS
  Filled 2017-06-02 (×5): qty 0.1

## 2017-06-02 MED ORDER — INSULIN ASPART 100 UNIT/ML ~~LOC~~ SOLN
0.0000 [IU] | Freq: Three times a day (TID) | SUBCUTANEOUS | Status: DC
Start: 2017-06-03 — End: 2017-06-06
  Administered 2017-06-03 – 2017-06-04 (×4): 2 [IU] via SUBCUTANEOUS
  Administered 2017-06-04: 1 [IU] via SUBCUTANEOUS
  Administered 2017-06-05: 3 [IU] via SUBCUTANEOUS
  Administered 2017-06-05: 1 [IU] via SUBCUTANEOUS
  Administered 2017-06-05: 2 [IU] via SUBCUTANEOUS
  Administered 2017-06-06: 1 [IU] via SUBCUTANEOUS
  Administered 2017-06-06: 3 [IU] via SUBCUTANEOUS
  Filled 2017-06-02: qty 1

## 2017-06-02 MED ORDER — FUROSEMIDE 10 MG/ML IJ SOLN
80.0000 mg | INTRAMUSCULAR | Status: AC
  Administered 2017-06-02: 80 mg via INTRAVENOUS
  Filled 2017-06-02: qty 8

## 2017-06-02 MED ORDER — SODIUM CHLORIDE 0.9% FLUSH: 3.0000 mL | INTRAVENOUS | Status: DC | PRN

## 2017-06-02 MED ORDER — HYPROMELLOSE (GONIOSCOPIC) 2.5 % OP SOLN
1.0000 [drp] | Freq: Three times a day (TID) | OPHTHALMIC | Status: DC | PRN
  Filled 2017-06-02: qty 15

## 2017-06-02 MED ORDER — SERTRALINE HCL 50 MG PO TABS
75.0000 mg | ORAL_TABLET | Freq: Every day | ORAL | Status: DC
  Administered 2017-06-03: 75 mg via ORAL
  Administered 2017-06-04: 100 mg via ORAL
  Administered 2017-06-05 – 2017-06-06 (×2): 75 mg via ORAL
  Filled 2017-06-02 (×4): qty 2

## 2017-06-02 MED ORDER — FUROSEMIDE 10 MG/ML IJ SOLN
60.0000 mg | Freq: Two times a day (BID) | INTRAMUSCULAR | Status: DC
  Administered 2017-06-03 – 2017-06-05 (×5): 60 mg via INTRAVENOUS
  Filled 2017-06-02: qty 8
  Filled 2017-06-02 (×4): qty 6

## 2017-06-02 MED ORDER — ATORVASTATIN CALCIUM 10 MG PO TABS
10.0000 mg | ORAL_TABLET | Freq: Every day | ORAL | Status: DC
  Administered 2017-06-03 – 2017-06-05 (×3): 10 mg via ORAL
  Filled 2017-06-02 (×5): qty 1

## 2017-06-02 MED ORDER — IPRATROPIUM-ALBUTEROL 0.5-2.5 (3) MG/3ML IN SOLN
3.0000 mL | Freq: Four times a day (QID) | RESPIRATORY_TRACT | Status: DC
  Administered 2017-06-03 (×2): 3 mL via RESPIRATORY_TRACT
  Filled 2017-06-02 (×2): qty 3

## 2017-06-02 MED ORDER — ONDANSETRON HCL 4 MG/2ML IJ SOLN: 4.0000 mg | Freq: Four times a day (QID) | INTRAMUSCULAR | Status: DC | PRN

## 2017-06-02 MED ORDER — LORATADINE 10 MG PO TABS
10.0000 mg | ORAL_TABLET | Freq: Every day | ORAL | Status: DC
  Administered 2017-06-03 – 2017-06-06 (×4): 10 mg via ORAL
  Filled 2017-06-02 (×4): qty 1

## 2017-06-02 MED ORDER — TRAMADOL HCL 50 MG PO TABS: 50.0000 mg | ORAL_TABLET | Freq: Two times a day (BID) | ORAL | Status: DC | PRN

## 2017-06-02 MED ORDER — SODIUM CHLORIDE 0.9 % IV SOLN: 250.0000 mL | INTRAVENOUS | Status: DC | PRN

## 2017-06-02 MED ORDER — INSULIN ASPART 100 UNIT/ML ~~LOC~~ SOLN
0.0000 [IU] | Freq: Every day | SUBCUTANEOUS | Status: DC
Start: 2017-06-02 — End: 2017-06-06
  Administered 2017-06-03 – 2017-06-05 (×2): 2 [IU] via SUBCUTANEOUS

## 2017-06-02 MED ORDER — SALINE SPRAY 0.65 % NA SOLN
1.0000 | NASAL | Status: DC | PRN
  Filled 2017-06-02: qty 44

## 2017-06-02 MED ORDER — SENNA 8.6 MG PO TABS
1.0000 | ORAL_TABLET | Freq: Every day | ORAL | Status: DC
  Administered 2017-06-03 – 2017-06-05 (×3): 8.6 mg via ORAL
  Filled 2017-06-02 (×3): qty 1

## 2017-06-02 MED ORDER — NEBIVOLOL HCL 5 MG PO TABS
5.0000 mg | ORAL_TABLET | Freq: Every day | ORAL | Status: DC
  Administered 2017-06-03 – 2017-06-06 (×4): 5 mg via ORAL
  Filled 2017-06-02 (×4): qty 1

## 2017-06-02 MED ORDER — ACETAMINOPHEN 325 MG PO TABS
650.0000 mg | ORAL_TABLET | ORAL | Status: DC | PRN
  Administered 2017-06-04 – 2017-06-06 (×4): 650 mg via ORAL
  Filled 2017-06-02 (×4): qty 2

## 2017-06-02 MED ORDER — VITAMIN D3 25 MCG (1000 UNIT) PO TABS
1000.0000 [IU] | ORAL_TABLET | Freq: Every morning | ORAL | Status: DC
  Administered 2017-06-03 – 2017-06-06 (×4): 1000 [IU] via ORAL
  Filled 2017-06-02 (×4): qty 1

## 2017-06-02 MED ORDER — AZELASTINE HCL 0.1 % NA SOLN
2.0000 | Freq: Two times a day (BID) | NASAL | Status: DC
  Administered 2017-06-03 – 2017-06-06 (×7): 2 via NASAL
  Filled 2017-06-02: qty 30

## 2017-06-02 MED ORDER — ENOXAPARIN SODIUM 40 MG/0.4ML ~~LOC~~ SOLN
40.0000 mg | SUBCUTANEOUS | Status: DC
  Administered 2017-06-03 – 2017-06-06 (×4): 40 mg via SUBCUTANEOUS
  Filled 2017-06-02 (×4): qty 0.4

## 2017-06-02 MED ORDER — IPRATROPIUM-ALBUTEROL 0.5-2.5 (3) MG/3ML IN SOLN
3.0000 mL | Freq: Once | RESPIRATORY_TRACT | Status: AC
  Administered 2017-06-02: 3 mL via RESPIRATORY_TRACT
  Filled 2017-06-02: qty 3

## 2017-06-02 MED ORDER — DICLOFENAC SODIUM 1 % TD GEL: 4.0000 g | Freq: Two times a day (BID) | TRANSDERMAL | Status: DC | PRN

## 2017-06-02 NOTE — ED Provider Notes (Signed)
[facility] DEPT Provider Note   CSN: [id number] Arrival date & time: 06/02/17  1817     History   Chief Complaint Chief Complaint  Patient presents with  . Shortness of Breath    HPI Makayla Rodriguez is a 84 y.o. female.  84 year old female with past medical history including CHF, chronic respiratory failure on 4L O2, obesity hypoventilation syndrome, hypertension, CKD who p/w redness of breath and lethargy.  Per the patient's nursing aide at home, she called EMS because the patient has been more lethargic over the past 3 days and seems to be more short of breath with difficulty speaking due to respiratory distress.  She is normally on 4 L oxygen at home and on BiPAP at night.  The patient denies any cough, chest pain, or abdominal pain.  LEVEL 5 CAVEAT DUE TO RESPIRATORY DISTRESS   The history is provided by the patient.  Shortness of Breath     Past Medical History:  Diagnosis Date  . Anxiety   . Arthritis   . CHF (congestive heart failure) ([place])    12/13 admission  . CKD (chronic kidney disease), stage III ([place])   . Complication of anesthesia    O2 sat drop during  and went into coma  . Diabetes mellitus without complication ([place])   . GERD (gastroesophageal reflux disease)   . Hypertension   . Obesity   . Obesity hypoventilation syndrome ([place])    uses O2 at home  . Seasonal allergies   . Shortness of breath    with annxiety attack  . Sleep apnea    CPAP    Patient Active Problem List   Diagnosis Date Noted  . Chronic diastolic HF (heart failure) ([place])   . Elevated troponin 03/18/2017  . Depression 03/18/2017  . HLD (hyperlipidemia) 03/18/2017  . CKD (chronic kidney disease), stage III ([place])   . DNR (do not resuscitate) discussion   . Palliative care by specialist   . Acute exacerbation of CHF (congestive heart failure) ([place]) 12/19/2016  . Type II diabetes mellitus with renal manifestations ([place])   . Hyperlipidemia   .  Morbid obesity with body mass index of 45.0-49.9 in adult [facility]) 09/20/2014  . Dyspnea 09/28/2013  . Allergic rhinitis 09/11/2013  . Cancer of upper-outer quadrant of female breast ([place]) 03/07/2013  . OSA (obstructive sleep apnea) 08/09/2012  . Obesity hypoventilation syndrome ([place]) 08/09/2012  . Pleural mass 05/15/2012  . Acute diastolic heart failure, NYHA class 2 ([place]) 05/11/2012  . Diabetes mellitus ([place]) 05/11/2012  . OA (osteoarthritis) of ankle 05/11/2012  . Essential hypertension 05/11/2012  . Morbid obesity ([place]) 05/11/2012    Past Surgical History:  Procedure Laterality Date  .  fybroid tumors    . BREAST SURGERY    . CHOLECYSTECTOMY    . EYE SURGERY     cataracts  . HERNIA REPAIR     umbilical  . PARTIAL MASTECTOMY WITH NEEDLE LOCALIZATION Left 04/05/2013   Procedure: PARTIAL MASTECTOMY WITH NEEDLE LOCALIZATION;  Surgeon: Adin Hector, MD;  Location: [facility];  Service: General;  Laterality: Left;  . PILONIDAL CYST EXCISION     buttock  . TONSILLECTOMY      OB History    No data available       Home Medications    Prior to Admission medications   Medication Sig Start Date End Date Taking? Authorizing Provider  acetaminophen (TYLENOL) 500 MG tablet Take 500 mg by mouth every 6 (six) hours as  needed for mild pain.    Yes [provider]  aspirin EC 81 MG tablet Take 81 mg by mouth every morning.   Yes [provider]  atorvastatin (LIPITOR) 10 MG tablet Take 10 mg by mouth at bedtime. Reported on 05/14/2015 07/22/14  Yes [provider]  azelastine (ASTELIN) 0.1 % nasal spray Place 2 sprays into both nostrils 2 (two) times daily. Use in each nostril as directed 04/16/14  Yes Ollis, [name] L, NP  cetirizine (ZYRTEC) 10 MG tablet Take 10 mg by mouth daily. Reported on 05/14/2015   Yes [provider]  cholecalciferol (VITAMIN D) 1000 UNITS tablet Take 1,000 Units by mouth every morning. Reported on 05/14/2015   Yes [provider]  Dextromethorphan-Guaifenesin (TUSSIN DM PO) Take 5 mLs by mouth as needed (for cough).    Yes [provider]  diclofenac sodium (VOLTAREN) 1 % GEL Apply 4 g topically 2 (two) times daily as needed (for joint pain). For joint pain   Yes [provider]  Dietary Management Product (TOZAL PO) Take 3 tablets by mouth daily. For eyes   Yes [provider]  fluticasone (FLONASE) 50 MCG/ACT nasal spray Place 2 sprays into the nose 2 (two) times daily.    Yes [provider]  HUMALOG 100 UNIT/ML injection Inject 2-10 Units into the skin 2 (two) times daily before lunch and supper. Per sliding scale: 150-200 2 units, 201-250, 4 units, 251-300 8 units, 301-350 10 units, >350 call doctor 01/19/17  Yes [provider]  hydroxypropyl methylcellulose (ISOPTO TEARS) 2.5 % ophthalmic solution Place 1 drop into both eyes 3 (three) times daily as needed (for dry eyes).   Yes [provider]  ipratropium-albuterol (DUONEB) 0.5-2.5 (3) MG/3ML SOLN Take 3 mLs by nebulization 3 (three) times daily. 03/20/17  Yes Barton Dubois, MD  LANTUS 100 UNIT/ML injection Inject 10 Units into the skin at bedtime. 01/19/17  Yes [provider]  magnesium hydroxide (MILK OF MAGNESIA) 400 MG/5ML suspension Take 15 mLs by mouth daily as needed for mild constipation.   Yes [provider]  nebivolol (BYSTOLIC) 5 MG tablet Take 5 mg by mouth daily. 05/20/12  Yes Barton Dubois, MD  Omega-3 Fatty Acids (OMEGA 3 PO) Take 1 capsule by mouth every morning.   Yes [provider]  senna (SENOKOT) 8.6 MG TABS tablet Take 1 tablet by mouth at bedtime.   Yes [provider]  sertraline (ZOLOFT) 25 MG tablet Take 3 tablets (75 mg total) by mouth daily. 12/30/16  Yes Velvet Bathe, MD  sodium chloride (OCEAN) 0.65 % SOLN nasal spray Place 1 spray into both nostrils as needed for congestion. 04/16/14  Yes Ollis, [name] L, NP  torsemide (DEMADEX) 20 MG  tablet Take 3 tablets (60 mg total) by mouth daily. 12/26/16  Yes Florencia Reasons, MD  traMADol (ULTRAM) 50 MG tablet Take 1 tablet (50 mg total) by mouth every 12 (twelve) hours as needed (for pain.). 03/20/17  Yes Barton Dubois, MD    Family History Family History  Problem Relation Age of Onset  . Heart failure Mother   . Gallbladder disease Father   . Pancreatic cancer Sister     Social History Social History   Tobacco Use  . Smoking status: Former Smoker    Packs/day: 0.50    Years: 40.00    Pack years: 20.00    Types: Cigarettes    Last attempt to quit: 05/30/1980    Years since quitting: 37.0  .  Smokeless tobacco: Never Used  Substance Use Topics  . Alcohol use: No  . Drug use: No     Allergies   Demerol [meperidine]; Penicillins; Phenergan [promethazine hcl]; Codeine; Macrobid [nitrofurantoin]; and Relafen [nabumetone]   Review of Systems Review of Systems  Unable to perform ROS: Severe respiratory distress  Respiratory: Positive for shortness of breath.      Physical Exam Updated Vital Signs BP (!) 116/58   Pulse 66   Temp 98.4 F (36.9 C) (Oral)   Resp 19   SpO2 95%   Physical Exam  Constitutional: She appears well-developed and well-nourished.  Mild respiratory distress  HENT:  Head: Normocephalic and atraumatic.  Moist mucous membranes  Eyes: Conjunctivae are normal. Pupils are equal, round, and reactive to light.  Neck: Neck supple.  Cardiovascular: Normal rate, regular rhythm and normal heart sounds.  No murmur heard. Pulmonary/Chest: Breath sounds normal. Accessory muscle usage present. She is in respiratory distress.  Able to speak only 2-3 words at a time, dyspneic with poor inspiratory effort, diminished b/l  Abdominal: Soft. Bowel sounds are normal. She exhibits no distension. There is no tenderness.  Musculoskeletal:       Right lower leg: She exhibits edema.       Left lower leg: She exhibits edema.  Neurological: She is alert.  Sleepy but  arouses to voice  Skin: Skin is warm and dry.  Psychiatric: Judgment normal.  Nursing note and vitals reviewed.    ED Treatments / Results  Labs (all labs ordered are listed, but only abnormal results are displayed) Labs Reviewed  COMPREHENSIVE METABOLIC PANEL - Abnormal; Notable for the following components:      Result Value   Chloride 90 (*)    CO2 43 (*)    Glucose, Bld 139 (*)    BUN 53 (*)    ALT 11 (*)    GFR calc non Af Amer 51 (*)    GFR calc Af Amer 59 (*)    All other components within normal limits  BRAIN NATRIURETIC PEPTIDE - Abnormal; Notable for the following components:   B Natriuretic Peptide 259.3 (*)    All other components within normal limits  BLOOD GAS, VENOUS - Abnormal; Notable for the following components:   pCO2, Ven 112 (*)    Bicarbonate 48.4 (*)    Acid-Base Excess 16.6 (*)    All other components within normal limits  CBC WITH DIFFERENTIAL/PLATELET  I-STAT CG4 LACTIC ACID, ED  I-STAT TROPONIN, ED    EKG  EKG Interpretation  Date/Time:  Friday June 02 2017 19:28:33 EST Ventricular Rate:  65 PR Interval:    QRS Duration: 95 QT Interval:  452 QTC Calculation: 470 R Axis:   -18 Text Interpretation:  Sinus or ectopic atrial rhythm Prolonged PR interval Borderline left axis deviation Low voltage, precordial leads No significant change since last tracing Confirmed by Theotis Burrow [phone]) on 06/02/2017 7:33:26 PM       Radiology Dg Chest Port 1 View  Result Date: 06/02/2017 CLINICAL DATA:  Respiratory distress and hypoxia.  History of CHF. EXAM: PORTABLE CHEST 1 VIEW COMPARISON:  03/18/2017 FINDINGS: Patient rotation limits the examination. Shallow inspiration with atelectasis in the lung bases. Cardiac enlargement. No definite vascular congestion or edema. No blunting of costophrenic angles. No pneumothorax. IMPRESSION: Shallow inspiration with linear atelectasis in the lung bases. Cardiac enlargement without definite congestive changes.  Electronically Signed   By: Lucienne Capers M.D.   On: 06/02/2017 21:04  Procedures .Critical Care Performed by: Sharlett Iles, MD Authorized by: Sharlett Iles, MD   Critical care provider statement:    Critical care time (minutes):  35   Critical care time was exclusive of:  Separately billable procedures and treating other patients   Critical care was necessary to treat or prevent imminent or life-threatening deterioration of the following conditions:  Respiratory failure   Critical care was time spent personally by me on the following activities:  Development of treatment plan with patient or surrogate, evaluation of patient's response to treatment, examination of patient, obtaining history from patient or surrogate, ordering and performing treatments and interventions, ordering and review of laboratory studies, ordering and review of radiographic studies, re-evaluation of patient's condition and review of old charts   (including critical care time)  Medications Ordered in ED Medications  furosemide (LASIX) injection 80 mg (not administered)  ipratropium-albuterol (DUONEB) 0.5-2.5 (3) MG/3ML nebulizer solution 3 mL (3 mLs Nebulization Given 06/02/17 1949)     Initial Impression / Assessment and Plan / ED Course  I have reviewed the triage vital signs and the nursing notes.  Pertinent labs & imaging results that were available during my care of the patient were reviewed by me and considered in my medical decision making (see chart for details).    PT w/ worsening lethargy and shortness of breath.  I reviewed her chart and she has history of previous admissions for acute on chronic respiratory failure often related to her CHF.  She did not have any significant wheezing on exam, just poor respiratory effort and dyspnea.  Initial blood glass shows hypercarbia with CO2 of 112, immediately placed on BiPAP.  Chest x-ray with cardiomegaly, no obvious pulmonary edema or  pleural effusions.  Troponin lactate normal.  Normal CBC, CMP notable for chloride 90, BUN 53, creatinine 0.99, BNP 259.  I gave a DuoNeb and on repeat exam she does not have any significant wheezing therefore I have held on any steroids at this time.  She has no signs of sepsis based on vital signs or lab work and I have also held on antibiotics.  I am suspicious of volume overload leading to acute on chronic respiratory failure.  Have given 80 mg IV Lasix.  Discussed admission with [facility] hospitalist, Dr. Myna Hidalgo, and pt admitted for further care.  Final Clinical Impressions(s) / ED Diagnoses   Final diagnoses:  None    ED Discharge Orders    None       Akeem Heppler, Wenda Overland, MD 06/02/17 2219

## 2017-06-02 NOTE — ED Triage Notes (Signed)
Pt arrived from home via EMS. Per nursing aide at home, patient has become more lethargic over the last 3 days and has been having difficulty speaking without becoming short of breath. Pt denies pain and lung sounds are clear per EMS. Hx of CHF and wears 4L O2 at home/BiPap at night.

## 2017-06-02 NOTE — ED Notes (Signed)
ED Provider at bedside. 

## 2017-06-02 NOTE — ED Notes (Signed)
Will obtain EKG as soon as pt is moved into room 17.

## 2017-06-02 NOTE — H&P (Signed)
History and Physical    Makayla Rodriguez TIW:[id number] DOB: 1931-06-21 DOA: 06/02/2017  PCP: Darcus Austin, MD   Patient coming from: Home  Chief Complaint: Somnolence, dyspnea   HPI: Makayla Rodriguez is a 84 y.o. female with medical history significant for OHS, OSA, chronic hypercarbic and hypoxic respiratory failure on supplemental oxygen during the day and nocturnal BiPAP, chronic diastolic CHF, insulin-dependent diabetes mellitus, lung mass followed by pulmonology and suspected to be benign, and depression, now presenting to the emergency department for evaluation of somnolence and dyspnea.  Per the report of the patient's caregiver, she has been increasingly somnolent and just 3 days and is noted to be in some respiratory distress with speaking.  Patient denies chest pain, cough, or fevers.  She has had similar symptoms previously requiring hospitalization.  In July she was admitted with hypercarbic respiratory failure and required prolonged ventilation and LTAC.   ED Course: Upon arrival to the ED, patient is found to be afebrile, saturating low 90s on her supplemental oxygen, and with vitals otherwise normal.  EKG features a sinus or ectopic atrial rhythm.  Chest x-ray is notable for shallow inspiration with linear atelectasis and cardiomegaly without definite congestive changes.  Chemistry panel is notable for a bicarbonate of 43 and BUN of 53.  CBC is unremarkable.  Troponin and lactic acid are normal.  BNP is elevated to 259.  Venous blood gas reveals hypercarbia.  Patient was treated with DuoNeb, 80 mg IV Lasix, and BiPAP in the ED.  She remains hemodynamically stable and will be admitted to the stepdown unit for ongoing evaluation and acute on chronic hypercarbic respiratory failure suspected secondary to OSA/OHS and possibly acute on chronic diastolic CHF.  Review of Systems:  All other systems reviewed and apart from HPI, are negative.  Past Medical History:  Diagnosis Date  . Anxiety     . Arthritis   . CHF (congestive heart failure) ([place])    12/13 admission  . CKD (chronic kidney disease), stage III ([place])   . Complication of anesthesia    O2 sat drop during  and went into coma  . Diabetes mellitus without complication ([place])   . GERD (gastroesophageal reflux disease)   . Hypertension   . Obesity   . Obesity hypoventilation syndrome ([place])    uses O2 at home  . Seasonal allergies   . Shortness of breath    with annxiety attack  . Sleep apnea    CPAP    Past Surgical History:  Procedure Laterality Date  .  fybroid tumors    . BREAST SURGERY    . CHOLECYSTECTOMY    . EYE SURGERY     cataracts  . HERNIA REPAIR     umbilical  . PARTIAL MASTECTOMY WITH NEEDLE LOCALIZATION Left 04/05/2013   Procedure: PARTIAL MASTECTOMY WITH NEEDLE LOCALIZATION;  Surgeon: Adin Hector, MD;  Location: [facility];  Service: General;  Laterality: Left;  . PILONIDAL CYST EXCISION     buttock  . TONSILLECTOMY       reports that she quit smoking about 37 years ago. Her smoking use included cigarettes. She has a 20.00 pack-year smoking history. she has never used smokeless tobacco. She reports that she does not drink alcohol or use drugs.  Allergies  Allergen Reactions  . Demerol [Meperidine] Anaphylaxis    Reaction: Hypoventilation after surgery  . Penicillins Anaphylaxis and Swelling    Has patient had a PCN reaction causing immediate rash, facial/tongue/throat swelling, SOB or lightheadedness with hypotension:  Yes Has patient had a PCN reaction causing severe rash involving mucus membranes or skin necrosis: No Has patient had a PCN reaction that required hospitalization: Yes Has patient had a PCN reaction occurring within the last 10 years: No If all of the above answers are "NO", then may proceed with Cephalosporin use.   Marland Kitchen Phenergan [Promethazine Hcl] Anaphylaxis    Reaction: hypoventilation  . Codeine Nausea And Vomiting  . Macrobid [Nitrofurantoin] Nausea And Vomiting  .  Relafen [Nabumetone] Itching and Swelling    Family History  Problem Relation Age of Onset  . Heart failure Mother   . Gallbladder disease Father   . Pancreatic cancer Sister      Prior to Admission medications   Medication Sig Start Date End Date Taking? Authorizing Provider  acetaminophen (TYLENOL) 500 MG tablet Take 500 mg by mouth every 6 (six) hours as needed for mild pain.    Yes [provider]  aspirin EC 81 MG tablet Take 81 mg by mouth every morning.   Yes [provider]  atorvastatin (LIPITOR) 10 MG tablet Take 10 mg by mouth at bedtime. Reported on 05/14/2015 07/22/14  Yes [provider]  azelastine (ASTELIN) 0.1 % nasal spray Place 2 sprays into both nostrils 2 (two) times daily. Use in each nostril as directed 04/16/14  Yes Ollis, [name] L, NP  cetirizine (ZYRTEC) 10 MG tablet Take 10 mg by mouth daily. Reported on 05/14/2015   Yes [provider]  cholecalciferol (VITAMIN D) 1000 UNITS tablet Take 1,000 Units by mouth every morning. Reported on 05/14/2015   Yes [provider]  Dextromethorphan-Guaifenesin (TUSSIN DM PO) Take 5 mLs by mouth as needed (for cough).    Yes [provider]  diclofenac sodium (VOLTAREN) 1 % GEL Apply 4 g topically 2 (two) times daily as needed (for joint pain). For joint pain   Yes [provider]  Dietary Management Product (TOZAL PO) Take 3 tablets by mouth daily. For eyes   Yes [provider]  fluticasone (FLONASE) 50 MCG/ACT nasal spray Place 2 sprays into the nose 2 (two) times daily.    Yes [provider]  HUMALOG 100 UNIT/ML injection Inject 2-10 Units into the skin 2 (two) times daily before lunch and supper. Per sliding scale: 150-200 2 units, 201-250, 4 units, 251-300 8 units, 301-350 10 units, >350 call doctor 01/19/17  Yes [provider]  hydroxypropyl methylcellulose (ISOPTO TEARS) 2.5 % ophthalmic solution Place 1 drop into both eyes 3 (three)  times daily as needed (for dry eyes).   Yes [provider]  ipratropium-albuterol (DUONEB) 0.5-2.5 (3) MG/3ML SOLN Take 3 mLs by nebulization 3 (three) times daily. 03/20/17  Yes Barton Dubois, MD  LANTUS 100 UNIT/ML injection Inject 10 Units into the skin at bedtime. 01/19/17  Yes [provider]  magnesium hydroxide (MILK OF MAGNESIA) 400 MG/5ML suspension Take 15 mLs by mouth daily as needed for mild constipation.   Yes [provider]  nebivolol (BYSTOLIC) 5 MG tablet Take 5 mg by mouth daily. 05/20/12  Yes Barton Dubois, MD  Omega-3 Fatty Acids (OMEGA 3 PO) Take 1 capsule by mouth every morning.   Yes [provider]  senna (SENOKOT) 8.6 MG TABS tablet Take 1 tablet by mouth at bedtime.   Yes [provider]  sertraline (ZOLOFT) 25 MG tablet Take 3 tablets (75 mg total) by mouth daily. 12/30/16  Yes Velvet Bathe, MD  sodium chloride (OCEAN) 0.65 % SOLN nasal spray  Place 1 spray into both nostrils as needed for congestion. 04/16/14  Yes Ollis, [name] L, NP  torsemide (DEMADEX) 20 MG tablet Take 3 tablets (60 mg total) by mouth daily. 12/26/16  Yes Florencia Reasons, MD  traMADol (ULTRAM) 50 MG tablet Take 1 tablet (50 mg total) by mouth every 12 (twelve) hours as needed (for pain.). 03/20/17  Bertell Maria, MD    Physical Exam: Vitals:   06/02/17 2037 06/02/17 2100 06/02/17 2130 06/02/17 2200  BP: (!) 116/58 (!) 106/45 (!) 109/39 (!) 126/56  Pulse: 66 (!) 57 66 64  Resp:  19 (!) 22 20  Temp:      TempSrc:      SpO2: 95% 92% 92% 91%      Constitutional: Not in acute distress, obese, lethargic  Eyes: PERTLA, lids and conjunctivae normal ENMT: Mucous membranes are moist. Posterior pharynx clear of any exudate or lesions.   Neck: normal, supple, no masses, no thyromegaly Respiratory: clear to auscultation bilaterally, no wheezing, no crackles. No accessory muscle use. On BiPAP. Cardiovascular: S1 & S2 heard, regular rate and rhythm. No extremity  edema.  Abdomen: No distension, no tenderness, no masses palpated. Bowel sounds normal.  Musculoskeletal: no clubbing / cyanosis. No joint deformity upper and lower extremities.  Skin: no significant rashes, lesions, ulcers. Warm, dry, well-perfused. Neurologic: CN 2-12 grossly intact. Sensation intact. Strength 5/5 in all 4 limbs.  Psychiatric: Somnolent, easily roused. Calm. Cooperative.     Labs on Admission: I have personally reviewed following labs and imaging studies  CBC: Recent Labs  Lab 06/02/17 1931  WBC 10.1  NEUTROABS 6.2  HGB 12.8  HCT 42.1  MCV 98.1  PLT 308   Basic Metabolic Panel: Recent Labs  Lab 06/02/17 1931  NA 143  K 4.2  CL 90*  CO2 43*  GLUCOSE 139*  BUN 53*  CREATININE 0.99  CALCIUM 10.2   GFR: CrCl cannot be calculated (Unknown ideal weight.). Liver Function Tests: Recent Labs  Lab 06/02/17 1931  AST 16  ALT 11*  ALKPHOS 47  BILITOT 0.6  PROT 8.0  ALBUMIN 3.7   No results for input(s): LIPASE, AMYLASE in the last 168 hours. No results for input(s): AMMONIA in the last 168 hours. Coagulation Profile: No results for input(s): INR, PROTIME in the last 168 hours. Cardiac Enzymes: No results for input(s): CKTOTAL, CKMB, CKMBINDEX, TROPONINI in the last 168 hours. BNP (last 3 results) No results for input(s): PROBNP in the last 8760 hours. HbA1C: No results for input(s): HGBA1C in the last 72 hours. CBG: No results for input(s): GLUCAP in the last 168 hours. Lipid Profile: No results for input(s): CHOL, HDL, LDLCALC, TRIG, CHOLHDL, LDLDIRECT in the last 72 hours. Thyroid Function Tests: No results for input(s): TSH, T4TOTAL, FREET4, T3FREE, THYROIDAB in the last 72 hours. Anemia Panel: No results for input(s): VITAMINB12, FOLATE, FERRITIN, TIBC, IRON, RETICCTPCT in the last 72 hours. Urine analysis:    Component Value Date/Time   COLORURINE YELLOW 04/12/2014 1738   APPEARANCEUR CLEAR 04/12/2014 1738   LABSPEC 1.022 04/12/2014  1738   PHURINE 5.5 04/12/2014 1738   GLUCOSEU NEGATIVE 04/12/2014 1738   HGBUR SMALL (A) 04/12/2014 1738   BILIRUBINUR NEGATIVE 04/12/2014 1738   KETONESUR NEGATIVE 04/12/2014 1738   PROTEINUR 100 (A) 04/12/2014 1738   UROBILINOGEN 0.2 04/12/2014 1738   NITRITE NEGATIVE 04/12/2014 1738   LEUKOCYTESUR NEGATIVE 04/12/2014 1738   Sepsis Labs: @LABRCNTIP (procalcitonin:4,lacticidven:4) )No results found for this or any previous visit (from the past  240 hour(s)).   Radiological Exams on Admission: Dg Chest Port 1 View  Result Date: 06/02/2017 CLINICAL DATA:  Respiratory distress and hypoxia.  History of CHF. EXAM: PORTABLE CHEST 1 VIEW COMPARISON:  03/18/2017 FINDINGS: Patient rotation limits the examination. Shallow inspiration with atelectasis in the lung bases. Cardiac enlargement. No definite vascular congestion or edema. No blunting of costophrenic angles. No pneumothorax. IMPRESSION: Shallow inspiration with linear atelectasis in the lung bases. Cardiac enlargement without definite congestive changes. Electronically Signed   By: Lucienne Capers M.D.   On: 06/02/2017 21:04    EKG: Independently reviewed. Sinus or ectopic atrial rhythm.   Assessment/Plan  1. Acute on chronic hypercarbic respiratory failure  - Presents with somnolence and dyspnea for 3 days  - No fever or leukocytosis, no consolidation or edema on CXR, hypercarbic on blood gas  - Likely secondary to OHS/OSA, possible contribution from acute on chronic diastolic CHF  - Admitted in July 2018 with hypercarbic respiratory failure and required prolonged ventilation at [facility]  - Treated with Lasix 80 mg IV and started on BiPAP in ED  - Eval and treat possible acute CHF as below, continue BiPAP and check ABG in an hour  2. Acute on chronic diastolic CHF  - TTE from October '18 with EF 43-32%, grade 1 diastolic dysfunction, mild AS and TR, severe LAE  - Presents with somnolence and dyspnea for 3 days  - Treated in ED with  Lasix 80 mg IV  - BUN significantly increased and fluid-status not entirely clear; hold additional Lasix for now and check weight  - Check weight, SLIV, follow daily wt and I/O's, fluid-restrict diet, continue beta-blocker, consider repeat echo    3. OSA/OHS  - Presents with somnolence and dyspnea, found to be hypercarbic  - Continue BiPAP and check ABG shortly  - Continue nocturnal BiPAP    4. Insulin-dependent DM  - A1c was 7.2% in October 2018  - Managed at home with Lantus 10 units qHS and Humalog 2-10 BID  - Check CBG's, continue Lantus 10 units qHS, start SSI with Novolog     DVT prophylaxis: Lovenox Code Status: Full  Family Communication: Caretaker updated at bedside Disposition Plan: Admit to SDU Consults called: None Admission status: Inpatient    Vianne Bulls, MD [facility] Pager [phone]  If 7PM-7AM, please contact night-coverage [email or web address] Password [facility]  06/02/2017, 10:21 PM

## 2017-06-03 ENCOUNTER — Other Ambulatory Visit: Payer: Self-pay

## 2017-06-03 LAB — BASIC METABOLIC PANEL
Anion gap: 11 (ref 5–15)
BUN: 54 mg/dL — AB (ref 6–20)
CALCIUM: 10.2 mg/dL (ref 8.9–10.3)
CO2: 44 mmol/L — ABNORMAL HIGH (ref 22–32)
CREATININE: 0.93 mg/dL (ref 0.44–1.00)
Chloride: 91 mmol/L — ABNORMAL LOW (ref 101–111)
GFR calc Af Amer: >60 mL/min (ref >60)
GFR, EST NON AFRICAN AMERICAN: 55 mL/min — AB (ref >60)
Glucose, Bld: 148 mg/dL — ABNORMAL HIGH (ref 65–99)
Potassium: 4.3 mmol/L (ref 3.5–5.1)
SODIUM: 146 mmol/L — AB (ref 135–145)

## 2017-06-03 LAB — CBG MONITORING, ED
GLUCOSE-CAPILLARY: 112 mg/dL — AB (ref 65–99)
GLUCOSE-CAPILLARY: 200 mg/dL — AB (ref 65–99)
Glucose-Capillary: 137 mg/dL — ABNORMAL HIGH (ref 65–99)
Glucose-Capillary: 170 mg/dL — ABNORMAL HIGH (ref 65–99)

## 2017-06-03 LAB — GLUCOSE, CAPILLARY
Glucose-Capillary: 190 mg/dL — ABNORMAL HIGH (ref 65–99)
Glucose-Capillary: 245 mg/dL — ABNORMAL HIGH (ref 65–99)

## 2017-06-03 LAB — MRSA PCR SCREENING: MRSA BY PCR: NEGATIVE

## 2017-06-03 MED ORDER — IPRATROPIUM-ALBUTEROL 0.5-2.5 (3) MG/3ML IN SOLN
3.0000 mL | Freq: Three times a day (TID) | RESPIRATORY_TRACT | Status: DC
  Administered 2017-06-03 – 2017-06-06 (×9): 3 mL via RESPIRATORY_TRACT
  Filled 2017-06-03 (×9): qty 3

## 2017-06-03 NOTE — Progress Notes (Signed)
Pt requested nasal mask for cpap and then changed her mind for a FF mask.  RT charged for extra mask.

## 2017-06-03 NOTE — ED Notes (Signed)
Patient remains on bipap in no distress-aide at bedside

## 2017-06-03 NOTE — ED Notes (Signed)
Patient incontinent large amount of urine-cleaned and repositioned for comfort

## 2017-06-03 NOTE — ED Notes (Signed)
ED TO INPATIENT HANDOFF REPORT  Name/Age/Gender Makayla Rodriguez 84 y.o. female  Code Status    Code Status Orders  (From admission, onward)        Start     Ordered   06/02/17 2219  Full code  Continuous     06/02/17 2220    Code Status History    Date Active Date Inactive Code Status Order ID Comments User Context   03/18/2017 20:22 03/20/2017 16:11 Full Code [id number]  Ivor Costa, MD ED   12/30/2016 09:09 01/17/2017 16:32 Full Code [id number]  Ozzie Hoyle Inpatient   12/19/2016 10:20 12/29/2016 20:51 Full Code [id number]  Rondel Jumbo, PA-C ED   04/12/2014 21:11 04/16/2014 17:57 Full Code [id number]  Guy Begin, MD ED   09/28/2013 05:25 10/03/2013 18:46 Full Code [id number]  Toy Baker, MD Inpatient   09/28/2013 05:25 09/28/2013 05:25 Full Code [id number]  Toy Baker, MD Inpatient   04/05/2013 19:16 04/06/2013 18:40 Full Code [id number]  Fanny Skates, MD Inpatient   05/11/2012 21:09 05/21/2012 17:16 Full Code [id number]  Earma Reading, RN Inpatient      Home/SNF/Other Home  Chief Complaint shortness of breath  Level of Care/Admitting Diagnosis ED Disposition    ED Disposition Condition [place]: [facility] [[id number]]  Level of Care: Stepdown [14]  Admit to SDU based on following criteria: Respiratory Distress:  Frequent assessment and/or intervention to maintain adequate ventilation/respiration, pulmonary toilet, and respiratory treatment.  Diagnosis: Acute and chronic respiratory failure with hypercapnia [facility]) [[id number]]  Admitting Physician: Vianne Bulls [[id number]]  Attending Physician: Vianne Bulls [[id number]]  Estimated length of stay: past midnight tomorrow  Certification:: I certify this patient will need inpatient services for at least 2 midnights  PT Class (Do Not Modify): Inpatient [101]  PT Acc Code (Do Not Modify): Private [1]       Medical History Past Medical History:  Diagnosis Date  .  Anxiety   . Arthritis   . CHF (congestive heart failure) ([place])    12/13 admission  . CKD (chronic kidney disease), stage III ([place])   . Complication of anesthesia    O2 sat drop during  and went into coma  . Diabetes mellitus without complication ([place])   . GERD (gastroesophageal reflux disease)   . Hypertension   . Obesity   . Obesity hypoventilation syndrome ([place])    uses O2 at home  . Seasonal allergies   . Shortness of breath    with annxiety attack  . Sleep apnea    CPAP    Allergies Allergies  Allergen Reactions  . Demerol [Meperidine] Anaphylaxis    Reaction: Hypoventilation after surgery  . Penicillins Anaphylaxis and Swelling    Has patient had a PCN reaction causing immediate rash, facial/tongue/throat swelling, SOB or lightheadedness with hypotension: Yes Has patient had a PCN reaction causing severe rash involving mucus membranes or skin necrosis: No Has patient had a PCN reaction that required hospitalization: Yes Has patient had a PCN reaction occurring within the last 10 years: No If all of the above answers are "NO", then may proceed with Cephalosporin use.   Marland Kitchen Phenergan [Promethazine Hcl] Anaphylaxis    Reaction: hypoventilation  . Codeine Nausea And Vomiting  . Macrobid [Nitrofurantoin] Nausea And Vomiting  . Relafen [Nabumetone] Itching and Swelling    IV Location/Drains/Wounds Patient Lines/Drains/Airways Status   Active Line/Drains/Airways    Name:   Placement date:   Placement time:   Site:  Days:   Peripheral IV 06/02/17 Left Wrist   06/02/17    2000    Wrist   1   External Urinary Catheter   12/28/16    1900    -   157          Labs/Imaging Results for orders placed or performed during the hospital encounter of 06/02/17 (from the past 48 hour(s))  Comprehensive metabolic panel     Status: Abnormal   Collection Time: 06/02/17  7:31 PM  Result Value Ref Range   Sodium 143 135 - 145 mmol/L   Potassium 4.2 3.5 - 5.1 mmol/L   Chloride 90 (L)  101 - 111 mmol/L   CO2 43 (H) 22 - 32 mmol/L   Glucose, Bld 139 (H) 65 - 99 mg/dL   BUN 53 (H) 6 - 20 mg/dL   Creatinine, Ser 0.99 0.44 - 1.00 mg/dL   Calcium 10.2 8.9 - 10.3 mg/dL   Total Protein 8.0 6.5 - 8.1 g/dL   Albumin 3.7 3.5 - 5.0 g/dL   AST 16 15 - 41 U/L   ALT 11 (L) 14 - 54 U/L   Alkaline Phosphatase 47 38 - 126 U/L   Total Bilirubin 0.6 0.3 - 1.2 mg/dL   GFR calc non Af Amer 51 (L) >60 mL/min   GFR calc Af Amer 59 (L) >60 mL/min    Comment: (NOTE) The eGFR has been calculated using the CKD EPI equation. This calculation has not been validated in all clinical situations. eGFR's persistently <60 mL/min signify possible Chronic Kidney Disease.    Anion gap 10 5 - 15  CBC with Differential     Status: None   Collection Time: 06/02/17  7:31 PM  Result Value Ref Range   WBC 10.1 4.0 - 10.5 K/uL   RBC 4.29 3.87 - 5.11 MIL/uL   Hemoglobin 12.8 12.0 - 15.0 g/dL   HCT 42.1 36.0 - 46.0 %   MCV 98.1 78.0 - 100.0 fL   MCH 29.8 26.0 - 34.0 pg   MCHC 30.4 30.0 - 36.0 g/dL   RDW 12.6 11.5 - 15.5 %   Platelets 214 150 - 400 K/uL   Neutrophils Relative % 60 %   Neutro Abs 6.2 1.7 - 7.7 K/uL   Lymphocytes Relative 29 %   Lymphs Abs 2.9 0.7 - 4.0 K/uL   Monocytes Relative 9 %   Monocytes Absolute 0.9 0.1 - 1.0 K/uL   Eosinophils Relative 2 %   Eosinophils Absolute 0.2 0.0 - 0.7 K/uL   Basophils Relative 0 %   Basophils Absolute 0.0 0.0 - 0.1 K/uL  Brain natriuretic peptide     Status: Abnormal   Collection Time: 06/02/17  7:31 PM  Result Value Ref Range   B Natriuretic Peptide 259.3 (H) 0.0 - 100.0 pg/mL  Blood gas, venous     Status: Abnormal (Preliminary result)   Collection Time: 06/02/17  7:58 PM  Result Value Ref Range   FIO2 28.00    O2 Content PENDING L/min   Delivery systems NO CHARGE    pH, Ven 7.258 7.250 - 7.430   pCO2, Ven 112 (HH) 44.0 - 60.0 mmHg    Comment: CRITICAL RESULT CALLED TO, READ BACK BY AND VERIFIED WITH: LINDSAY COLES BY KELLY JARRELL, RRT  RCP 06/02/2017 AT 2006    pO2, Ven 35.0 32.0 - 45.0 mmHg   Bicarbonate 48.4 (H) 20.0 - 28.0 mmol/L   Acid-Base Excess 16.6 (H) 0.0 - 2.0 mmol/L  O2 Saturation 60.0 %   Patient temperature 98.6    Collection site VEIN    Drawn by [phone]    Sample type VEIN   I-stat troponin, ED     Status: None   Collection Time: 06/02/17  8:10 PM  Result Value Ref Range   Troponin i, poc 0.02 0.00 - 0.08 ng/mL   Comment 3            Comment: Due to the release kinetics of cTnI, a negative result within the first hours of the onset of symptoms does not rule out myocardial infarction with certainty. If myocardial infarction is still suspected, repeat the test at appropriate intervals.   I-Stat CG4 Lactic Acid, ED     Status: None   Collection Time: 06/02/17  8:12 PM  Result Value Ref Range   Lactic Acid, Venous 0.82 0.5 - 1.9 mmol/L  Blood gas, arterial     Status: Abnormal (Preliminary result)   Collection Time: 06/03/17 12:13 AM  Result Value Ref Range   FIO2 35.00    O2 Content PENDING L/min   Delivery systems BILEVEL POSITIVE AIRWAY PRESSURE    Mode PENDING    LHR 12 resp/min   Inspiratory PAP 10    Expiratory PAP 5    pH, Arterial 7.399 7.350 - 7.450   pCO2 arterial 74.4 (HH) 32.0 - 48.0 mmHg    Comment: CRITICAL RESULT CALLED TO, READ BACK BY AND VERIFIED WITH:   pO2, Arterial 69.1 (L) 83.0 - 108.0 mmHg   Bicarbonate 45.0 (H) 20.0 - 28.0 mmol/L   Acid-Base Excess 16.8 (H) 0.0 - 2.0 mmol/L   O2 Saturation 94.0 %   Patient temperature 37.0    Collection site RIGHT RADIAL    Drawn by [phone]    Sample type ARTERIAL DRAW    Allens test (pass/fail) PASS PASS  CBG monitoring, ED     Status: Abnormal   Collection Time: 06/03/17 12:59 AM  Result Value Ref Range   Glucose-Capillary 137 (H) 65 - 99 mg/dL  Basic metabolic panel     Status: Abnormal   Collection Time: 06/03/17  5:29 AM  Result Value Ref Range   Sodium 146 (H) 135 - 145 mmol/L   Potassium 4.3 3.5 - 5.1 mmol/L   Chloride  91 (L) 101 - 111 mmol/L   CO2 44 (H) 22 - 32 mmol/L   Glucose, Bld 148 (H) 65 - 99 mg/dL   BUN 54 (H) 6 - 20 mg/dL   Creatinine, Ser 0.93 0.44 - 1.00 mg/dL   Calcium 10.2 8.9 - 10.3 mg/dL   GFR calc non Af Amer 55 (L) >60 mL/min   GFR calc Af Amer >60 >60 mL/min    Comment: (NOTE) The eGFR has been calculated using the CKD EPI equation. This calculation has not been validated in all clinical situations. eGFR's persistently <60 mL/min signify possible Chronic Kidney Disease.    Anion gap 11 5 - 15  CBG monitoring, ED     Status: Abnormal   Collection Time: 06/03/17  8:58 AM  Result Value Ref Range   Glucose-Capillary 112 (H) 65 - 99 mg/dL  CBG monitoring, ED     Status: Abnormal   Collection Time: 06/03/17 12:20 PM  Result Value Ref Range   Glucose-Capillary 200 (H) 65 - 99 mg/dL  CBG monitoring, ED     Status: Abnormal   Collection Time: 06/03/17  2:31 PM  Result Value Ref Range   Glucose-Capillary 170 (H) 65 -  99 mg/dL   Dg Chest Port 1 View  Result Date: 06/02/2017 CLINICAL DATA:  Respiratory distress and hypoxia.  History of CHF. EXAM: PORTABLE CHEST 1 VIEW COMPARISON:  03/18/2017 FINDINGS: Patient rotation limits the examination. Shallow inspiration with atelectasis in the lung bases. Cardiac enlargement. No definite vascular congestion or edema. No blunting of costophrenic angles. No pneumothorax. IMPRESSION: Shallow inspiration with linear atelectasis in the lung bases. Cardiac enlargement without definite congestive changes. Electronically Signed   By: Lucienne Capers M.D.   On: 06/02/2017 21:04    Pending Labs Unresulted Labs (From admission, onward)   Start     Ordered   06/09/17 0500  Creatinine, serum  (enoxaparin (LOVENOX)    CrCl >/= 30 ml/min)  Weekly,   R    Comments:  while on enoxaparin therapy    06/02/17 2220   06/03/17 5956  Basic metabolic panel  Daily,   R     06/02/17 2220      Vitals/Pain Today's Vitals   06/03/17 1415 06/03/17 1430 06/03/17 1445  06/03/17 1612  BP:  135/62    Pulse: 72 68 77   Resp: (!) 22 (!) 21 19   Temp:      TempSrc:      SpO2: 97% 95% 95%   PainSc:    0-No pain    Isolation Precautions No active isolations  Medications Medications  aspirin EC tablet 81 mg (81 mg Oral Given 06/03/17 1157)  atorvastatin (LIPITOR) tablet 10 mg (not administered)  azelastine (ASTELIN) 0.1 % nasal spray 2 spray (2 sprays Each Nare Given 06/03/17 1159)  cholecalciferol (VITAMIN D) tablet 1,000 Units (1,000 Units Oral Given 06/03/17 1158)  loratadine (CLARITIN) tablet 10 mg (10 mg Oral Given 06/03/17 1159)  diclofenac sodium (VOLTAREN) 1 % transdermal gel 4 g (not administered)  hydroxypropyl methylcellulose / hypromellose (ISOPTO TEARS / GONIOVISC) 2.5 % ophthalmic solution 1 drop (not administered)  ipratropium-albuterol (DUONEB) 0.5-2.5 (3) MG/3ML nebulizer solution 3 mL (3 mLs Nebulization Not Given 06/03/17 1423)  magnesium hydroxide (MILK OF MAGNESIA) suspension 15 mL (not administered)  nebivolol (BYSTOLIC) tablet 5 mg (5 mg Oral Given 06/03/17 1158)  senna (SENOKOT) tablet 8.6 mg (8.6 mg Oral Not Given 06/03/17 0814)  sertraline (ZOLOFT) tablet 75 mg (75 mg Oral Given 06/03/17 1157)  sodium chloride (OCEAN) 0.65 % nasal spray 1 spray (not administered)  traMADol (ULTRAM) tablet 50 mg (not administered)  sodium chloride flush (NS) 0.9 % injection 3 mL (3 mLs Intravenous Given 06/03/17 1159)  sodium chloride flush (NS) 0.9 % injection 3 mL (not administered)  0.9 %  sodium chloride infusion (not administered)  acetaminophen (TYLENOL) tablet 650 mg (not administered)  ondansetron (ZOFRAN) injection 4 mg (not administered)  enoxaparin (LOVENOX) injection 40 mg (40 mg Subcutaneous Given 06/03/17 0102)  furosemide (LASIX) injection 60 mg (60 mg Intravenous Given 06/03/17 1159)  insulin glargine (LANTUS) injection 10 Units (10 Units Subcutaneous Given 06/03/17 0101)  insulin aspart (novoLOG) injection 0-9 Units (2 Units Subcutaneous Given 06/03/17  1436)  insulin aspart (novoLOG) injection 0-5 Units (0 Units Subcutaneous Not Given 06/03/17 0100)  ipratropium-albuterol (DUONEB) 0.5-2.5 (3) MG/3ML nebulizer solution 3 mL (3 mLs Nebulization Given 06/02/17 1949)  furosemide (LASIX) injection 80 mg (80 mg Intravenous Given 06/02/17 2330)    Mobility non-ambulatory

## 2017-06-03 NOTE — Progress Notes (Signed)
PROGRESS NOTE    Makayla Rodriguez  ZDG:[id number] DOB: Jul 05, 1931 DOA: 06/02/2017 PCP: Darcus Austin, MD  Brief Narrative: 84 y.o. female with medical history significant for OHS, OSA, chronic hypercarbic and hypoxic respiratory failure on supplemental oxygen during the day and nocturnal BiPAP, chronic diastolic CHF, insulin-dependent diabetes mellitus, lung mass followed by pulmonology and suspected to be benign, and depression, now presenting to the emergency department for evaluation of somnolence and dyspnea.  Per the report of the patient's caregiver, she has been increasingly somnolent and just 3 days and is noted to be in some respiratory distress with speaking.  Patient denies chest pain, cough, or fevers.  She has had similar symptoms previously requiring hospitalization.  In July she was admitted with hypercarbic respiratory failure and required prolonged ventilation and LTAC.   ED Course: Upon arrival to the ED, patient is found to be afebrile, saturating low 90s on her supplemental oxygen, and with vitals otherwise normal.  EKG features a sinus or ectopic atrial rhythm.  Chest x-ray is notable for shallow inspiration with linear atelectasis and cardiomegaly without definite congestive changes.  Chemistry panel is notable for a bicarbonate of 43 and BUN of 53.  CBC is unremarkable.  Troponin and lactic acid are normal.  BNP is elevated to 259.  Venous blood gas reveals hypercarbia.  Patient was treated with DuoNeb, 80 mg IV Lasix, and BiPAP in the ED.  She remains hemodynamically stable and will be admitted to the stepdown unit for ongoing evaluation and acute on chronic hypercarbic respiratory failure suspected secondary to OSA/OHS and possibly acute on chronic diastolic CHF.   Assessment & Plan:   Principal Problem:   Acute on chronic respiratory failure with hypercapnia (HCC) Active Problems:   Acute on chronic diastolic CHF (congestive heart failure) (HCC)   Essential hypertension   OSA  (obstructive sleep apnea)   Obesity hypoventilation syndrome (H[facility])   Cancer of upper-outer quadrant of female breast ([place])   Type II diabetes mellitus with renal manifestations ([place])   Depression   Acute and chronic respiratory failure with hypercapnia ([place])   1. Acute on chronic hypercarbic respiratory failure  - Presents with somnolence and dyspnea for 3 days  - No fever or leukocytosis, no consolidation or edema on CXR, hypercarbic on blood gas  - Likely secondary to OHS/OSA, possible contribution from acute on chronic diastolic CHF  - Admitted in July 2018 with hypercarbic respiratory failure and required prolonged ventilation at [facility]  - Treated with Lasix 80 mg IV and started on BiPAP in ED  - repeat abg better.patient awake alert.  2. Acute on chronic diastolic CHF  - TTE from October '18 with EF 59-56%, grade 1 diastolic dysfunction, mild AS and TR, severe LAE  - Presents with somnolence and dyspnea for 3 days  - Treated in ED with Lasix 80 mg IV  - BUN significantly increased and fluid-status not entirely clear; hold additional Lasix for now and check weight  - Check weight, SLIV, follow daily wt and I/O's, fluid-restrict diet, continue beta-blocker, consider repeat echo    3. OSA/OHS  - Continue nocturnal BiPAP    4. Insulin-dependent DM  - A1c was 7.2% in October 2018  - Managed at home with Lantus 10 units qHS and Humalog 2-10 BID  - Check CBG's, continue Lantus 10 units qHS, start SSI with Novolog        DVT prophylaxis: lovenox Code Statusfull : Family Communication: none Disposition Plantbd  Consultants: none  Procedures:none Antimicrobials: Subjective:resting  in nad on 3 lit o2  Objective: Vitals:   06/03/17 1430 06/03/17 1445 06/03/17 1639 06/03/17 1655  BP: 135/62  (!) 126/42   Pulse: 68 77 (!) 58   Resp: (!) 21 19 19    Temp:   98.5 F (36.9 C)   TempSrc:   Oral   SpO2: 95% 95% 93% 93%  Weight:   114.3 kg (251 lb 15.8 oz)   Height:   5\' 4"   (1.626 m)     Intake/Output Summary (Last 24 hours) at 06/03/2017 1702 Last data filed at 06/03/2017 1159 Gross per 24 hour  Intake 3 ml  Output -  Net 3 ml   Filed Weights   06/03/17 1639  Weight: 114.3 kg (251 lb 15.8 oz)    Examination:  General exam: Appears calm and comfortable  Respiratory system: few wheezes auscultation. Respiratory effort normal. Cardiovascular system: S1 & S2 heard, RRR. No JVD, murmurs, rubs, gallops or clicks. No pedal edema. Gastrointestinal system: Abdomen is nondistended, soft and nontender. No organomegaly or masses felt. Normal bowel sounds heard. Central nervous system: Alert and oriented. No focal neurological deficits. Extremities: Symmetric 5 x 5 power. Skin: No rashes, lesions or ulcers Psychiatry: Judgement and insight appear normal. Mood & affect appropriate.     Data Reviewed: I have personally reviewed following labs and imaging studies  CBC: Recent Labs  Lab 06/02/17 1931  WBC 10.1  NEUTROABS 6.2  HGB 12.8  HCT 42.1  MCV 98.1  PLT 355   Basic Metabolic Panel: Recent Labs  Lab 06/02/17 1931 06/03/17 0529  NA 143 146*  K 4.2 4.3  CL 90* 91*  CO2 43* 44*  GLUCOSE 139* 148*  BUN 53* 54*  CREATININE 0.99 0.93  CALCIUM 10.2 10.2   GFR: Estimated Creatinine Clearance: 56.8 mL/min (by C-G formula based on SCr of 0.93 mg/dL). Liver Function Tests: Recent Labs  Lab 06/02/17 1931  AST 16  ALT 11*  ALKPHOS 47  BILITOT 0.6  PROT 8.0  ALBUMIN 3.7   No results for input(s): LIPASE, AMYLASE in the last 168 hours. No results for input(s): AMMONIA in the last 168 hours. Coagulation Profile: No results for input(s): INR, PROTIME in the last 168 hours. Cardiac Enzymes: No results for input(s): CKTOTAL, CKMB, CKMBINDEX, TROPONINI in the last 168 hours. BNP (last 3 results) No results for input(s): PROBNP in the last 8760 hours. HbA1C: No results for input(s): HGBA1C in the last 72 hours. CBG: Recent Labs  Lab  06/03/17 0059 06/03/17 0858 06/03/17 1220 06/03/17 1431  GLUCAP 137* 112* 200* 170*   Lipid Profile: No results for input(s): CHOL, HDL, LDLCALC, TRIG, CHOLHDL, LDLDIRECT in the last 72 hours. Thyroid Function Tests: No results for input(s): TSH, T4TOTAL, FREET4, T3FREE, THYROIDAB in the last 72 hours. Anemia Panel: No results for input(s): VITAMINB12, FOLATE, FERRITIN, TIBC, IRON, RETICCTPCT in the last 72 hours. Sepsis Labs: Recent Labs  Lab 06/02/17 2012  LATICACIDVEN 0.82    No results found for this or any previous visit (from the past 240 hour(s)).       Radiology Studies: Dg Chest Port 1 View  Result Date: 06/02/2017 CLINICAL DATA:  Respiratory distress and hypoxia.  History of CHF. EXAM: PORTABLE CHEST 1 VIEW COMPARISON:  03/18/2017 FINDINGS: Patient rotation limits the examination. Shallow inspiration with atelectasis in the lung bases. Cardiac enlargement. No definite vascular congestion or edema. No blunting of costophrenic angles. No pneumothorax. IMPRESSION: Shallow inspiration with linear atelectasis in the lung bases. Cardiac enlargement  without definite congestive changes. Electronically Signed   By: Lucienne Capers M.D.   On: 06/02/2017 21:04        Scheduled Meds: . aspirin EC  81 mg Oral q morning - 10a  . atorvastatin  10 mg Oral q1800  . azelastine  2 spray Each Nare BID  . cholecalciferol  1,000 Units Oral q morning - 10a  . enoxaparin (LOVENOX) injection  40 mg Subcutaneous Q24H  . furosemide  60 mg Intravenous Q12H  . insulin aspart  0-5 Units Subcutaneous QHS  . insulin aspart  0-9 Units Subcutaneous TID WC  . insulin glargine  10 Units Subcutaneous QHS  . ipratropium-albuterol  3 mL Nebulization TID  . loratadine  10 mg Oral Daily  . nebivolol  5 mg Oral Daily  . senna  1 tablet Oral QHS  . sertraline  75 mg Oral Daily  . sodium chloride flush  3 mL Intravenous Q12H   Continuous Infusions: . sodium chloride       LOS: 1 day       Georgette Shell, MD [facility]   If 7PM-7AM, please contact night-coverage [email or web address] Password [email or web address] 06/03/2017, 5:02 PM

## 2017-06-03 NOTE — ED Notes (Signed)
Multiple meds for pt remain unverified. Sent message to pharmacy to send 10:00 meds

## 2017-06-03 NOTE — ED Notes (Signed)
Patient transferred to hospital bed for comfort-pure wick in place. Patient repositioned for comfort. Bipap removed per patient request-states it is hurting her face-O2 applied at 3l/[place] and patient has O2 sat 94-97%. Am labs drawn. MP SR with no ectopy noted.

## 2017-06-04 ENCOUNTER — Other Ambulatory Visit: Payer: Self-pay

## 2017-06-04 ENCOUNTER — Inpatient Hospital Stay (HOSPITAL_COMMUNITY): Payer: Medicare Other

## 2017-06-04 LAB — BASIC METABOLIC PANEL
Anion gap: 12 (ref 5–15)
BUN: 59 mg/dL — AB (ref 6–20)
CHLORIDE: 87 mmol/L — AB (ref 101–111)
CO2: 39 mmol/L — ABNORMAL HIGH (ref 22–32)
CREATININE: 1.09 mg/dL — AB (ref 0.44–1.00)
Calcium: 9.3 mg/dL (ref 8.9–10.3)
GFR calc Af Amer: 53 mL/min — ABNORMAL LOW (ref >60)
GFR calc non Af Amer: 46 mL/min — ABNORMAL LOW (ref >60)
GLUCOSE: 176 mg/dL — AB (ref 65–99)
POTASSIUM: 4.3 mmol/L (ref 3.5–5.1)
SODIUM: 138 mmol/L (ref 135–145)

## 2017-06-04 LAB — CBC
HEMATOCRIT: 38 % (ref 36.0–46.0)
Hemoglobin: 11.5 g/dL — ABNORMAL LOW (ref 12.0–15.0)
MCH: 28.9 pg (ref 26.0–34.0)
MCHC: 30.3 g/dL (ref 30.0–36.0)
MCV: 95.5 fL (ref 78.0–100.0)
Platelets: 183 10*3/uL (ref 150–400)
RBC: 3.98 MIL/uL (ref 3.87–5.11)
RDW: 12.5 % (ref 11.5–15.5)
WBC: 8.9 10*3/uL (ref 4.0–10.5)

## 2017-06-04 LAB — URINALYSIS, ROUTINE W REFLEX MICROSCOPIC
Bilirubin Urine: NEGATIVE
GLUCOSE, UA: NEGATIVE mg/dL
Hgb urine dipstick: NEGATIVE
Ketones, ur: NEGATIVE mg/dL
Nitrite: POSITIVE — AB
PH: 5 (ref 5.0–8.0)
Protein, ur: NEGATIVE mg/dL
Specific Gravity, Urine: 1.011 (ref 1.005–1.030)

## 2017-06-04 LAB — GLUCOSE, CAPILLARY
GLUCOSE-CAPILLARY: 193 mg/dL — AB (ref 65–99)
Glucose-Capillary: 139 mg/dL — ABNORMAL HIGH (ref 65–99)
Glucose-Capillary: 153 mg/dL — ABNORMAL HIGH (ref 65–99)
Glucose-Capillary: 177 mg/dL — ABNORMAL HIGH (ref 65–99)

## 2017-06-04 MED ORDER — CHLORHEXIDINE GLUCONATE 0.12 % MT SOLN
15.0000 mL | Freq: Two times a day (BID) | OROMUCOSAL | Status: DC
  Administered 2017-06-04 – 2017-06-06 (×5): 15 mL via OROMUCOSAL
  Filled 2017-06-04 (×5): qty 15

## 2017-06-04 MED ORDER — ORAL CARE MOUTH RINSE
15.0000 mL | Freq: Two times a day (BID) | OROMUCOSAL | Status: DC
  Administered 2017-06-04 – 2017-06-05 (×3): 15 mL via OROMUCOSAL

## 2017-06-04 MED ORDER — DM-GUAIFENESIN ER 30-600 MG PO TB12
1.0000 | ORAL_TABLET | Freq: Two times a day (BID) | ORAL | Status: DC
Start: 2017-06-04 — End: 2017-06-06
  Administered 2017-06-04 – 2017-06-06 (×5): 1 via ORAL
  Filled 2017-06-04 (×5): qty 1

## 2017-06-04 NOTE — Progress Notes (Signed)
RT placed patient on CPAP . Setting is Auto titrait 8-20 cmH2O. 10 liters oxygen bleed in ti keep oxygen at 92% and above. Sterile water added to water chamber for humidification. Patient is tolerating well.

## 2017-06-04 NOTE — Progress Notes (Signed)
PROGRESS NOTE    Makayla Rodriguez  ZOX:[id number] DOB: 1932-03-25 DOA: 06/02/2017 PCP: Darcus Austin, MD   Brief Narrative: 84 y.o. female with medical history significant for OHS, OSA, chronic hypercarbic and hypoxic respiratory failure on supplemental oxygen during the day and nocturnal BiPAP, chronic diastolic CHF, insulin-dependent diabetes mellitus, lung mass followed by pulmonology and suspected to be benign, and depression, now presenting to the emergency department for evaluation of somnolence and dyspnea.  Per the report of the patient's caregiver, she has been increasingly somnolent and just 3 days and is noted to be in some respiratory distress with speaking.  Patient denies chest pain, cough, or fevers.  She has had similar symptoms previously requiring hospitalization.  In July she was admitted with hypercarbic respiratory failure and required prolonged ventilation and LTAC.   ED Course: Upon arrival to the ED, patient is found to be afebrile, saturating low 90s on her supplemental oxygen, and with vitals otherwise normal.  EKG features a sinus or ectopic atrial rhythm.  Chest x-ray is notable for shallow inspiration with linear atelectasis and cardiomegaly without definite congestive changes.  Chemistry panel is notable for a bicarbonate of 43 and BUN of 53.  CBC is unremarkable.  Troponin and lactic acid are normal.  BNP is elevated to 259.  Venous blood gas reveals hypercarbia.  Patient was treated with DuoNeb, 80 mg IV Lasix, and BiPAP in the ED.  She remains hemodynamically stable and will be admitted to the stepdown unit for ongoing evaluation and acute on chronic hypercarbic respiratory failure suspected secondary to OSA/OHS and possibly acute on chronic diastolic CHF.    Assessment & Plan:   Principal Problem:   Acute on chronic respiratory failure with hypercapnia (HCC) Active Problems:   Acute on chronic diastolic CHF (congestive heart failure) (HCC)   Essential hypertension  OSA (obstructive sleep apnea)   Obesity hypoventilation syndrome (HCC)   Cancer of upper-outer quadrant of female breast ([place])   Type II diabetes mellitus with renal manifestations (HCC)   Depression   Acute and chronic respiratory failure with hypercapnia (HCC)   Acute on chronic hypercarbic respiratory failure  - Presents with somnolence and dyspnea for 3 days  - No fever or leukocytosis, no consolidation or edema on CXR, hypercarbic on blood gas  - Likely secondary to OHS/OSA, possible contribution from acute on chronic diastolic CHF  - Admitted in July 2018 with hypercarbic respiratory failure and required prolonged ventilation at [facility]  - Treated with Lasix 80 mg IV and started on BiPAP in ED  -cxr no fluid overload.will hold any further lasix. 2. Acute on chronic diastolic CHF  - TTE from October '18 with EF 81-19%, grade 1 diastolic dysfunction, mild AS and TR, severe LAE  - Presents with somnolence and dyspnea for 3 days  - Treated in ED with Lasix 80 mg IV  - BUN significantly increased and fluid-status not entirely clear; hold additional Lasix for now and check weight  - Check weight, SLIV, follow daily wt and I/O's, fluid-restrict diet, continue beta-blocker. 3. OSA/OHS  - Presents with somnolence and dyspnea, found to be hypercarbic  - patient uses BIPAP at home. - Continue nocturnal BiPAP    4. Insulin-dependent DM  - A1c was 7.2% in October 2018  - Managed at home with Lantus 10 units qHS and Humalog 2-10 BID  - Check CBG's, continue Lantus 10 units qHS, start SSI with Novolog      DVT prophylaxis:lovenox Code Status:full Family Communication: dw sister Disposition  Plan:   tbd  Consultants: none  Procedures:none Antimicrobials:  none Subjective:feels better..complains of burning urination..   Objective: Vitals:   06/04/17 0700 06/04/17 0719 06/04/17 0800 06/04/17 0948  BP: (!) 117/37  (!) 132/37   Pulse: 71  (!) 53   Resp: 16  20   Temp:  97.6 F  (36.4 C)    TempSrc:  Axillary    SpO2: 97%  94% 92%  Weight:      Height:        Intake/Output Summary (Last 24 hours) at 06/04/2017 0954 Last data filed at 06/04/2017 0800 Gross per 24 hour  Intake 243 ml  Output 400 ml  Net -157 ml   Filed Weights   06/03/17 1639 06/04/17 0318  Weight: 114.3 kg (251 lb 15.8 oz) 115.9 kg (255 lb 8.2 oz)    Examination:  General exam: Appears calm and comfortable  Respiratory system: Clear to auscultation anteriorly. Respiratory effort normal. Cardiovascular system: S1 & S2 heard, RRR. No JVD, murmurs, rubs, gallops or clicks. No pedal edema. Gastrointestinal system: Abdomen is nondistended, soft and nontender. No organomegaly or masses felt. Normal bowel sounds heard. Central nervous system: Alert and oriented. No focal neurological deficits. Extremities: Symmetric 5 x 5 power. Skin: No rashes, lesions or ulcers Psychiatry: Judgement and insight appear normal. Mood & affect appropriate.     Data Reviewed: I have personally reviewed following labs and imaging studies  CBC: Recent Labs  Lab 06/02/17 1931 06/04/17 0335  WBC 10.1 8.9  NEUTROABS 6.2  --   HGB 12.8 11.5*  HCT 42.1 38.0  MCV 98.1 95.5  PLT 214 315   Basic Metabolic Panel: Recent Labs  Lab 06/02/17 1931 06/03/17 0529 06/04/17 0335  NA 143 146* 138  K 4.2 4.3 4.3  CL 90* 91* 87*  CO2 43* 44* 39*  GLUCOSE 139* 148* 176*  BUN 53* 54* 59*  CREATININE 0.99 0.93 1.09*  CALCIUM 10.2 10.2 9.3   GFR: Estimated Creatinine Clearance: 48.9 mL/min (A) (by C-G formula based on SCr of 1.09 mg/dL (H)). Liver Function Tests: Recent Labs  Lab 06/02/17 1931  AST 16  ALT 11*  ALKPHOS 47  BILITOT 0.6  PROT 8.0  ALBUMIN 3.7   No results for input(s): LIPASE, AMYLASE in the last 168 hours. No results for input(s): AMMONIA in the last 168 hours. Coagulation Profile: No results for input(s): INR, PROTIME in the last 168 hours. Cardiac Enzymes: No results for input(s):  CKTOTAL, CKMB, CKMBINDEX, TROPONINI in the last 168 hours. BNP (last 3 results) No results for input(s): PROBNP in the last 8760 hours. HbA1C: No results for input(s): HGBA1C in the last 72 hours. CBG: Recent Labs  Lab 06/03/17 1220 06/03/17 1431 06/03/17 1806 06/03/17 2203 06/04/17 0717  GLUCAP 200* 170* 190* 245* 139*   Lipid Profile: No results for input(s): CHOL, HDL, LDLCALC, TRIG, CHOLHDL, LDLDIRECT in the last 72 hours. Thyroid Function Tests: No results for input(s): TSH, T4TOTAL, FREET4, T3FREE, THYROIDAB in the last 72 hours. Anemia Panel: No results for input(s): VITAMINB12, FOLATE, FERRITIN, TIBC, IRON, RETICCTPCT in the last 72 hours. Sepsis Labs: Recent Labs  Lab 06/02/17 2012  LATICACIDVEN 0.82    Recent Results (from the past 240 hour(s))  MRSA PCR Screening     Status: None   Collection Time: 06/03/17  4:30 PM  Result Value Ref Range Status   MRSA by PCR NEGATIVE NEGATIVE Final    Comment:        The GeneXpert  MRSA Assay (FDA approved for NASAL specimens only), is one component of a comprehensive MRSA colonization surveillance program. It is not intended to diagnose MRSA infection nor to guide or monitor treatment for MRSA infections.          Radiology Studies: Dg Chest Port 1 View  Result Date: 06/02/2017 CLINICAL DATA:  Respiratory distress and hypoxia.  History of CHF. EXAM: PORTABLE CHEST 1 VIEW COMPARISON:  03/18/2017 FINDINGS: Patient rotation limits the examination. Shallow inspiration with atelectasis in the lung bases. Cardiac enlargement. No definite vascular congestion or edema. No blunting of costophrenic angles. No pneumothorax. IMPRESSION: Shallow inspiration with linear atelectasis in the lung bases. Cardiac enlargement without definite congestive changes. Electronically Signed   By: Lucienne Capers M.D.   On: 06/02/2017 21:04        Scheduled Meds: . aspirin EC  81 mg Oral q morning - 10a  . atorvastatin  10 mg Oral q1800   . azelastine  2 spray Each Nare BID  . chlorhexidine  15 mL Mouth Rinse BID  . cholecalciferol  1,000 Units Oral q morning - 10a  . enoxaparin (LOVENOX) injection  40 mg Subcutaneous Q24H  . furosemide  60 mg Intravenous Q12H  . insulin aspart  0-5 Units Subcutaneous QHS  . insulin aspart  0-9 Units Subcutaneous TID WC  . insulin glargine  10 Units Subcutaneous QHS  . ipratropium-albuterol  3 mL Nebulization TID  . loratadine  10 mg Oral Daily  . mouth rinse  15 mL Mouth Rinse q12n4p  . nebivolol  5 mg Oral Daily  . senna  1 tablet Oral QHS  . sertraline  75 mg Oral Daily  . sodium chloride flush  3 mL Intravenous Q12H   Continuous Infusions: . sodium chloride       LOS: 2 days      Georgette Shell, MD [facility]  If 7PM-7AM, please contact night-coverage [email or web address] Password [email or web address] 06/04/2017, 9:54 AM

## 2017-06-05 LAB — BLOOD GAS, VENOUS
ACID-BASE EXCESS: 16.6 mmol/L — AB (ref 0.0–2.0)
BICARBONATE: 48.4 mmol/L — AB (ref 20.0–28.0)
Drawn by: 514251
FIO2: 28
O2 Content: 2 L/min
O2 Saturation: 60 %
PCO2 VEN: 112 mmHg — AB (ref 44.0–60.0)
PH VEN: 7.258 (ref 7.250–7.430)
PO2 VEN: 35 mmHg (ref 32.0–45.0)
Patient temperature: 98.6

## 2017-06-05 LAB — BASIC METABOLIC PANEL
Anion gap: 10 (ref 5–15)
BUN: 70 mg/dL — AB (ref 6–20)
CO2: 41 mmol/L — ABNORMAL HIGH (ref 22–32)
CREATININE: 1.43 mg/dL — AB (ref 0.44–1.00)
Calcium: 9.7 mg/dL (ref 8.9–10.3)
Chloride: 88 mmol/L — ABNORMAL LOW (ref 101–111)
GFR, EST AFRICAN AMERICAN: 38 mL/min — AB (ref >60)
GFR, EST NON AFRICAN AMERICAN: 33 mL/min — AB (ref >60)
Glucose, Bld: 158 mg/dL — ABNORMAL HIGH (ref 65–99)
POTASSIUM: 4.3 mmol/L (ref 3.5–5.1)
SODIUM: 139 mmol/L (ref 135–145)

## 2017-06-05 LAB — BLOOD GAS, ARTERIAL
Acid-Base Excess: 16.8 mmol/L — ABNORMAL HIGH (ref 0.0–2.0)
BICARBONATE: 45 mmol/L — AB (ref 20.0–28.0)
Delivery systems: POSITIVE
Drawn by: 514251
Expiratory PAP: 5
FIO2: 35
Inspiratory PAP: 10
LHR: 12 {breaths}/min
MODE: POSITIVE
O2 Saturation: 94 %
PATIENT TEMPERATURE: 37
PCO2 ART: 74.4 mmHg — AB (ref 32.0–48.0)
PH ART: 7.399 (ref 7.350–7.450)
pO2, Arterial: 69.1 mmHg — ABNORMAL LOW (ref 83.0–108.0)

## 2017-06-05 LAB — GLUCOSE, CAPILLARY
GLUCOSE-CAPILLARY: 148 mg/dL — AB (ref 65–99)
GLUCOSE-CAPILLARY: 225 mg/dL — AB (ref 65–99)
Glucose-Capillary: 195 mg/dL — ABNORMAL HIGH (ref 65–99)
Glucose-Capillary: 205 mg/dL — ABNORMAL HIGH (ref 65–99)

## 2017-06-05 MED ORDER — SODIUM CHLORIDE 0.45 % IV SOLN
Freq: Once | INTRAVENOUS | Status: AC
  Administered 2017-06-05: 75 mL/h via INTRAVENOUS

## 2017-06-05 NOTE — Care Management Note (Signed)
Case Management Note  Patient Details  Name: Makayla Rodriguez MRN: [id number] Date of Birth: 20-Sep-1931  Subjective/Objective:                  hypoxia  Action/Plan: Date: June 05, 2017 Makayla Rodriguez, BSN, [place], [place] Chart and notes review for patient progress and needs. Will follow for case management and discharge needs. Next review date: [id number]  Expected Discharge Date:                  Expected Discharge Plan:  Home/Self Care  In-House Referral:     Discharge planning Services  CM Consult  Post Acute Care Choice:    Choice offered to:     DME Arranged:    DME Agency:     HH Arranged:    HH Agency:     Status of Service:  In process, will continue to follow  If discussed at Long Length of Stay Meetings, dates discussed:    Additional Comments:  Leeroy Cha, RN 06/05/2017, 8:52 AM

## 2017-06-05 NOTE — Progress Notes (Signed)
PROGRESS NOTE    Makayla Rodriguez  YHC:[id number] DOB: 1932-04-27 DOA: 06/02/2017 PCP: Darcus Austin, MD  Brief Narrative: Assessment & Plan:   Principal Problem:   Acute on chronic respiratory failure with hypercapnia ([place]) Active Problems:   Acute on chronic diastolic CHF (congestive heart failure) (HCC)   Essential hypertension   OSA (obstructive sleep apnea)   Obesity hypoventilation syndrome (HCC)   Cancer of upper-outer quadrant of female breast ([place])   Type II diabetes mellitus with renal manifestations (HCC)   Depression   Acute and chronic respiratory failure with hypercapnia (HCC)  Acute on chronic hypercarbic respiratory failure -Presents with somnolence and dyspnea for 3 days -No fever or leukocytosis, no consolidation or edema on CXR, hypercarbic on blood gas -Likely secondary to OHS/OSA, possible contribution from acute on chronic diastolic CHF  - Admitted in July 2018 with hypercarbic respiratory failure and required prolonged ventilation at [facility] -Treated with Lasix 80 mg IV and started on BiPAP in ED -cxr no fluid overload.dc lasix 2.Acute on chronic diastolic CHF -TTE from October '18 with EF 51-76%, grade 1 diastolic dysfunction, mild AS and TR, severe LAE -Presents with somnolence and dyspnea for 3 days -Treated in ED with Lasix 80 mg IV  - BUN significantly increased and fluid-status not entirely clear; hold additional Lasix for now and check weight  - Check weight, SLIV, follow daily wt and I/O's, fluid-restrict diet, continue beta-blocker. 3.OSA/OHS -Presents with somnolence and dyspnea, found to be hypercarbic - patient uses BIPAP at home. -Continue nocturnal BiPAP  4.Insulin-dependent DM -A1c was 7.2% in October 2018 -Managed at home with Lantus 10 units qHS and Humalog 2-10 BID -Check CBG's, continue Lantus 10 units qHS, start SSI with Novolog 5]aki dc lasix.slow ivf.chest xray clear.       DVT prophylaxis:  lovenox Code Status: full Family Communication dw sisiter Disposition Plan:. tbd  Consultants: none  Procedures:none Antimicrobialsnone Subjective:feels better   Objective: Vitals:   06/05/17 0800 06/05/17 0858 06/05/17 0900 06/05/17 1145  BP: (!) 157/43  (!) 127/46   Pulse: 60  75   Resp: 15  18   Temp:    98.7 F (37.1 C)  TempSrc:    Axillary  SpO2: 99% 94% 96%   Weight:      Height:        Intake/Output Summary (Last 24 hours) at 06/05/2017 1433 Last data filed at 06/05/2017 0900 Gross per 24 hour  Intake 480 ml  Output 952 ml  Net -472 ml   Filed Weights   06/03/17 1639 06/04/17 0318 06/05/17 0500  Weight: 114.3 kg (251 lb 15.8 oz) 115.9 kg (255 lb 8.2 oz) 116.6 kg (257 lb 0.9 oz)    Examination:  General exam: Appears calm and comfortable  Respiratory system: Clear to auscultation. Respiratory effort normal. Cardiovascular system: S1 & S2 heard, RRR. No JVD, murmurs, rubs, gallops or clicks. No pedal edema. Gastrointestinal system: Abdomen is nondistended, soft and nontender. No organomegaly or masses felt. Normal bowel sounds heard. Central nervous system: Alert and oriented. No focal neurological deficits. Extremities: Symmetric 5 x 5 power. Skin: No rashes, lesions or ulcers Psychiatry: Judgement and insight appear normal. Mood & affect appropriate.     Data Reviewed: I have personally reviewed following labs and imaging studies  CBC: Recent Labs  Lab 06/02/17 1931 06/04/17 0335  WBC 10.1 8.9  NEUTROABS 6.2  --   HGB 12.8 11.5*  HCT 42.1 38.0  MCV 98.1 95.5  PLT 214 183   Basic  Metabolic Panel: Recent Labs  Lab 06/02/17 1931 06/03/17 0529 06/04/17 0335 06/05/17 0345  NA 143 146* 138 139  K 4.2 4.3 4.3 4.3  CL 90* 91* 87* 88*  CO2 43* 44* 39* 41*  GLUCOSE 139* 148* 176* 158*  BUN 53* 54* 59* 70*  CREATININE 0.99 0.93 1.09* 1.43*  CALCIUM 10.2 10.2 9.3 9.7   GFR: Estimated Creatinine Clearance: 37.4 mL/min (A) (by C-G formula based  on SCr of 1.43 mg/dL (H)). Liver Function Tests: Recent Labs  Lab 06/02/17 1931  AST 16  ALT 11*  ALKPHOS 47  BILITOT 0.6  PROT 8.0  ALBUMIN 3.7   No results for input(s): LIPASE, AMYLASE in the last 168 hours. No results for input(s): AMMONIA in the last 168 hours. Coagulation Profile: No results for input(s): INR, PROTIME in the last 168 hours. Cardiac Enzymes: No results for input(s): CKTOTAL, CKMB, CKMBINDEX, TROPONINI in the last 168 hours. BNP (last 3 results) No results for input(s): PROBNP in the last 8760 hours. HbA1C: No results for input(s): HGBA1C in the last 72 hours. CBG: Recent Labs  Lab 06/04/17 1202 06/04/17 1530 06/04/17 2118 06/05/17 0742 06/05/17 1138  GLUCAP 153* 177* 193* 148* 195*   Lipid Profile: No results for input(s): CHOL, HDL, LDLCALC, TRIG, CHOLHDL, LDLDIRECT in the last 72 hours. Thyroid Function Tests: No results for input(s): TSH, T4TOTAL, FREET4, T3FREE, THYROIDAB in the last 72 hours. Anemia Panel: No results for input(s): VITAMINB12, FOLATE, FERRITIN, TIBC, IRON, RETICCTPCT in the last 72 hours. Sepsis Labs: Recent Labs  Lab 06/02/17 2012  LATICACIDVEN 0.82    Recent Results (from the past 240 hour(s))  MRSA PCR Screening     Status: None   Collection Time: 06/03/17  4:30 PM  Result Value Ref Range Status   MRSA by PCR NEGATIVE NEGATIVE Final    Comment:        The GeneXpert MRSA Assay (FDA approved for NASAL specimens only), is one component of a comprehensive MRSA colonization surveillance program. It is not intended to diagnose MRSA infection nor to guide or monitor treatment for MRSA infections.   Culture, Urine     Status: None (Preliminary result)   Collection Time: 06/04/17  9:43 AM  Result Value Ref Range Status   Specimen Description URINE, CLEAN CATCH  Final   Special Requests Normal  Final   Culture   Final    CULTURE REINCUBATED FOR BETTER GROWTH Performed at [facility], [address],  [place], [place] [address]    Report Status PENDING  Incomplete         Radiology Studies: Dg Chest 1 View  Result Date: 06/04/2017 CLINICAL DATA:  Hypoxia EXAM: CHEST 1 VIEW COMPARISON:  06/02/2017 FINDINGS: There are low lung volumes. There is no focal parenchymal opacity. There is no pleural effusion or pneumothorax. The heart and mediastinal contours are unremarkable. There is thoracic aortic atherosclerosis. The osseous structures are unremarkable. IMPRESSION: No active disease. Electronically Signed   By: Kathreen Devoid   On: 06/04/2017 12:19        Scheduled Meds: . aspirin EC  81 mg Oral q morning - 10a  . atorvastatin  10 mg Oral q1800  . azelastine  2 spray Each Nare BID  . chlorhexidine  15 mL Mouth Rinse BID  . cholecalciferol  1,000 Units Oral q morning - 10a  . dextromethorphan-guaiFENesin  1 tablet Oral BID  . enoxaparin (LOVENOX) injection  40 mg Subcutaneous Q24H  . furosemide  60 mg Intravenous Q12H  . insulin aspart  0-5 Units Subcutaneous QHS  . insulin aspart  0-9 Units Subcutaneous TID WC  . insulin glargine  10 Units Subcutaneous QHS  . ipratropium-albuterol  3 mL Nebulization TID  . loratadine  10 mg Oral Daily  . mouth rinse  15 mL Mouth Rinse q12n4p  . nebivolol  5 mg Oral Daily  . senna  1 tablet Oral QHS  . sertraline  75 mg Oral Daily  . sodium chloride flush  3 mL Intravenous Q12H   Continuous Infusions: . sodium chloride       LOS: 3 days       Georgette Shell, MD   If 7PM-7AM, please contact night-coverage [email or web address] Password [facility] 06/05/2017, 2:33 PM

## 2017-06-05 NOTE — Progress Notes (Signed)
PT Cancellation Note  Patient Details Name: Taylinn Brabant MRN: [id number] DOB: 1931/10/30   Cancelled Treatment:    Reason Eval/Treat Not Completed: PT screened, no needs identified, will sign off , patient is  At home with 24/7 caregivers and  Is OOB via [facility] only to go to MD visits.   Claretha Cooper 06/05/2017, 9:38 AM  Tresa Endo PT [phone]

## 2017-06-06 LAB — BASIC METABOLIC PANEL
Anion gap: 8 (ref 5–15)
BUN: 72 mg/dL — AB (ref 6–20)
CALCIUM: 9.3 mg/dL (ref 8.9–10.3)
CO2: 40 mmol/L — ABNORMAL HIGH (ref 22–32)
Chloride: 90 mmol/L — ABNORMAL LOW (ref 101–111)
Creatinine, Ser: 1.21 mg/dL — ABNORMAL HIGH (ref 0.44–1.00)
GFR calc Af Amer: 47 mL/min — ABNORMAL LOW (ref >60)
GFR, EST NON AFRICAN AMERICAN: 40 mL/min — AB (ref >60)
GLUCOSE: 181 mg/dL — AB (ref 65–99)
Potassium: 4.5 mmol/L (ref 3.5–5.1)
Sodium: 138 mmol/L (ref 135–145)

## 2017-06-06 LAB — GLUCOSE, CAPILLARY
GLUCOSE-CAPILLARY: 248 mg/dL — AB (ref 65–99)
GLUCOSE-CAPILLARY: 248 mg/dL — AB (ref 65–99)
Glucose-Capillary: 127 mg/dL — ABNORMAL HIGH (ref 65–99)

## 2017-06-06 MED ORDER — GUAIFENESIN ER 600 MG PO TB12: 600.0000 mg | ORAL_TABLET | Freq: Two times a day (BID) | ORAL | 2 refills | Status: AC

## 2017-06-06 NOTE — Progress Notes (Signed)
Transferred to 1443 via bed.

## 2017-06-06 NOTE — Discharge Summary (Signed)
Physician Discharge Summary  Makayla Rodriguez ZGY:[id number] DOB: [birth year] DOA: 06/02/2017  PCP: Darcus Austin, MD  Admit date: 06/02/2017 Discharge date: 06/06/2017  Admitted From: home Disposition: home Recommendations for Outpatient Follow-up:  1. Follow up with PCP in 1-2 weeks 2. Please obtain BMP/CBC in one week Home Health none Equipment/Devicesnone  Discharge Condition:stable CODE STATUS:full Diet recommendation:low salt carb modified  Brief/Interim Summary:85 y.o.femalewith medical history significant forOHS, OSA, chronic hypercarbic and hypoxic respiratory failure on supplemental oxygen during the day and nocturnal BiPAP, chronic diastolic CHF, insulin-dependent diabetes mellitus, lung mass followed by pulmonology and suspected to be benign, and depression, now presenting to the emergency department for evaluation of somnolence and dyspnea. Per the report of the patient's caregiver, she has been increasingly somnolent and just 3 days and is noted to be in some respiratory distress with speaking. Patient denies chest pain, cough, or fevers. She has had similar symptoms previously requiring hospitalization. In July she was admitted with hypercarbic respiratory failure and required prolonged ventilation and LTAC.  ED Course:Upon arrival to the ED, patient is found to be afebrile, saturating low 90s on her supplemental oxygen, and with vitals otherwise normal. EKG features a sinus or ectopic atrial rhythm. Chest x-ray is notable for shallow inspiration with linear atelectasis and cardiomegaly without definite congestive changes. Chemistry panel is notable for a bicarbonate of 43 and BUN of 53. CBC is unremarkable. Troponin and lactic acid are normal. BNP is elevated to 259. Venous blood gas reveals hypercarbia. Patient was treated with DuoNeb, 80 mg IV Lasix, and BiPAP in the ED. She remains hemodynamically stable and will be admitted to the stepdown unit for ongoing evaluation  and acute on chronic hypercarbic respiratory failure suspected secondary to OSA/OHS and possibly acute on chronic diastolic CHF.    Discharge Diagnoses:  Principal Problem:   Acute on chronic respiratory failure with hypercapnia (HCC) Active Problems:   Acute on chronic diastolic CHF (congestive heart failure) (HCC)   Essential hypertension   OSA (obstructive sleep apnea)   Obesity hypoventilation syndrome (HCC)   Cancer of upper-outer quadrant of female breast ([place])   Type II diabetes mellitus with renal manifestations (HCC)   Depression   Acute and chronic respiratory failure with hypercapnia (HCC)  Acute on chronic hypercarbic respiratory failure -Presents with somnolence and dyspnea for 3 days -No fever or leukocytosis, no consolidation or edema on CXR, hypercarbic on blood gas -Likely secondary to OHS/OSA, possible contribution from acute on chronic diastolic CHF  - Admitted in July 2018 with hypercarbic respiratory failure and required prolonged ventilation at [facility] -Treated with Lasix 80 mg IV and started on BiPAP in ED -cxr no fluid overload Patient will be discharged home today 06/16/2017.  She had recurrent admissions for hypercapnic hypercarbic respiratory failure secondary to noncompliance with the BiPAP she has at home.  However this admission she did get a mask that fits her face right and and I have advised the patient to use the BiPAP at night at least for 8 hours in order to prevent recurrent hospitalization.  This was discussed in detail with the patient patient's sister. 2.Acute on chronic diastolic CHF -TTE from October '18 with EF 59-16%, grade 1 diastolic dysfunction, mild AS and TR, severe LAE -Presents with somnolence and dyspnea for 3 days -Treated in ED with Lasix 80 mg IV  - BUN significantly increased and fluid-status not entirely clear; hold additional Lasix for now and check weight  - Check weight, SLIV, follow daily wt and I/O's,  fluid-restrict  diet, continue beta-blocker. Restart home dose of Demadex on discharge. 3.OSA/OHS -Presents with somnolence and dyspnea, found to be hypercarbic - patient uses BIPAP at home. -Continue nocturnal BiPAP  4.Insulin-dependent DM -A1c was 7.2% in October 2018 -Managed at home with Lantus 10 units qHS and Humalog 2-10 BID -Check CBG's, continue Lantus 10 units qHS, start SSI with Novolog      Discharge Instructions     Allergies  Allergen Reactions  . Demerol [Meperidine] Anaphylaxis    Reaction: Hypoventilation after surgery  . Penicillins Anaphylaxis and Swelling    Has patient had a PCN reaction causing immediate rash, facial/tongue/throat swelling, SOB or lightheadedness with hypotension: Yes Has patient had a PCN reaction causing severe rash involving mucus membranes or skin necrosis: No Has patient had a PCN reaction that required hospitalization: Yes Has patient had a PCN reaction occurring within the last 10 years: No If all of the above answers are "NO", then may proceed with Cephalosporin use.   Marland Kitchen Phenergan [Promethazine Hcl] Anaphylaxis    Reaction: hypoventilation  . Ativan [Lorazepam]     Stopped breathing  . Codeine Nausea And Vomiting  . Macrobid [Nitrofurantoin] Nausea And Vomiting  . Relafen [Nabumetone] Itching and Swelling    Consultations:none   Procedures/Studies: Dg Chest 1 View  Result Date: 06/04/2017 CLINICAL DATA:  Hypoxia EXAM: CHEST 1 VIEW COMPARISON:  06/02/2017 FINDINGS: There are low lung volumes. There is no focal parenchymal opacity. There is no pleural effusion or pneumothorax. The heart and mediastinal contours are unremarkable. There is thoracic aortic atherosclerosis. The osseous structures are unremarkable. IMPRESSION: No active disease. Electronically Signed   By: Kathreen Devoid   On: 06/04/2017 12:19   Dg Chest Port 1 View  Result Date: 06/02/2017 CLINICAL DATA:  Respiratory distress and hypoxia.   History of CHF. EXAM: PORTABLE CHEST 1 VIEW COMPARISON:  03/18/2017 FINDINGS: Patient rotation limits the examination. Shallow inspiration with atelectasis in the lung bases. Cardiac enlargement. No definite vascular congestion or edema. No blunting of costophrenic angles. No pneumothorax. IMPRESSION: Shallow inspiration with linear atelectasis in the lung bases. Cardiac enlargement without definite congestive changes. Electronically Signed   By: Lucienne Capers M.D.   On: 06/02/2017 21:04    (Echo, Carotid, EGD, Colonoscopy, ERCP)    Subjective:   Discharge Exam: Vitals:   06/06/17 0850 06/06/17 1025  BP:  (!) 126/55  Pulse: 73 89  Resp: 20 20  Temp:  (!) 97.5 F (36.4 C)  SpO2: 95% 91%   Vitals:   06/06/17 0750 06/06/17 0800 06/06/17 0850 06/06/17 1025  BP:  (!) 144/69  (!) 126/55  Pulse:  69 73 89  Resp:  (!) 27 20 20   Temp: 97.8 F (36.6 C)   (!) 97.5 F (36.4 C)  TempSrc: Oral   Oral  SpO2:  91% 95% 91%  Weight:      Height:        General: Pt is alert, awake, not in acute distress Cardiovascular: RRR, S1/S2 +, no rubs, no gallops Respiratory: CTA bilaterally, no wheezing, no rhonchi Abdominal: Soft, NT, ND, bowel sounds + Extremities: no edema, no cyanosis    The results of significant diagnostics from this hospitalization (including imaging, microbiology, ancillary and laboratory) are listed below for reference.     Microbiology: Recent Results (from the past 240 hour(s))  MRSA PCR Screening     Status: None   Collection Time: 06/03/17  4:30 PM  Result Value Ref Range Status   MRSA by PCR NEGATIVE  NEGATIVE Final    Comment:        The GeneXpert MRSA Assay (FDA approved for NASAL specimens only), is one component of a comprehensive MRSA colonization surveillance program. It is not intended to diagnose MRSA infection nor to guide or monitor treatment for MRSA infections.   Culture, Urine     Status: Abnormal (Preliminary result)   Collection Time:  06/04/17  9:43 AM  Result Value Ref Range Status   Specimen Description URINE, CLEAN CATCH  Final   Special Requests Normal  Final   Culture >=100,000 COLONIES/mL GRAM NEGATIVE RODS (A)  Final   Report Status PENDING  Incomplete     Labs: BNP (last 3 results) Recent Labs    12/19/16 0729 03/18/17 1635 06/02/17 1931  BNP 431.8* 164.4* 423.5*   Basic Metabolic Panel: Recent Labs  Lab 06/02/17 1931 06/03/17 0529 06/04/17 0335 06/05/17 0345 06/06/17 0344  NA 143 146* 138 139 138  K 4.2 4.3 4.3 4.3 4.5  CL 90* 91* 87* 88* 90*  CO2 43* 44* 39* 41* 40*  GLUCOSE 139* 148* 176* 158* 181*  BUN 53* 54* 59* 70* 72*  CREATININE 0.99 0.93 1.09* 1.43* 1.21*  CALCIUM 10.2 10.2 9.3 9.7 9.3   Liver Function Tests: Recent Labs  Lab 06/02/17 1931  AST 16  ALT 11*  ALKPHOS 47  BILITOT 0.6  PROT 8.0  ALBUMIN 3.7   No results for input(s): LIPASE, AMYLASE in the last 168 hours. No results for input(s): AMMONIA in the last 168 hours. CBC: Recent Labs  Lab 06/02/17 1931 06/04/17 0335  WBC 10.1 8.9  NEUTROABS 6.2  --   HGB 12.8 11.5*  HCT 42.1 38.0  MCV 98.1 95.5  PLT 214 183   Cardiac Enzymes: No results for input(s): CKTOTAL, CKMB, CKMBINDEX, TROPONINI in the last 168 hours. BNP: Invalid input(s): POCBNP CBG: Recent Labs  Lab 06/05/17 0742 06/05/17 1138 06/05/17 1612 06/05/17 2107 06/06/17 0742  GLUCAP 148* 195* 205* 225* 127*   D-Dimer No results for input(s): DDIMER in the last 72 hours. Hgb A1c No results for input(s): HGBA1C in the last 72 hours. Lipid Profile No results for input(s): CHOL, HDL, LDLCALC, TRIG, CHOLHDL, LDLDIRECT in the last 72 hours. Thyroid function studies No results for input(s): TSH, T4TOTAL, T3FREE, THYROIDAB in the last 72 hours.  Invalid input(s): FREET3 Anemia work up No results for input(s): VITAMINB12, FOLATE, FERRITIN, TIBC, IRON, RETICCTPCT in the last 72 hours. Urinalysis    Component Value Date/Time   COLORURINE YELLOW  06/04/2017 [place] 06/04/2017 0943   LABSPEC 1.011 06/04/2017 0943   PHURINE 5.0 06/04/2017 0943   GLUCOSEU NEGATIVE 06/04/2017 0943   HGBUR NEGATIVE 06/04/2017 0943   BILIRUBINUR NEGATIVE 06/04/2017 0943   KETONESUR NEGATIVE 06/04/2017 0943   PROTEINUR NEGATIVE 06/04/2017 0943   UROBILINOGEN 0.2 04/12/2014 1738   NITRITE POSITIVE (A) 06/04/2017 0943   LEUKOCYTESUR SMALL (A) 06/04/2017 0943   Sepsis Labs Invalid input(s): PROCALCITONIN,  WBC,  LACTICIDVEN Microbiology Recent Results (from the past 240 hour(s))  MRSA PCR Screening     Status: None   Collection Time: 06/03/17  4:30 PM  Result Value Ref Range Status   MRSA by PCR NEGATIVE NEGATIVE Final    Comment:        The GeneXpert MRSA Assay (FDA approved for NASAL specimens only), is one component of a comprehensive MRSA colonization surveillance program. It is not intended to diagnose MRSA infection nor to guide or monitor treatment for  MRSA infections.   Culture, Urine     Status: Abnormal (Preliminary result)   Collection Time: 06/04/17  9:43 AM  Result Value Ref Range Status   Specimen Description URINE, CLEAN CATCH  Final   Special Requests Normal  Final   Culture >=100,000 COLONIES/mL GRAM NEGATIVE RODS (A)  Final   Report Status PENDING  Incomplete     Time coordinating discharge: Over 30 minutes  SIGNED:   Georgette Shell, MD  [facility] 06/06/2017, 11:02 AM Pager   If 7PM-7AM, please contact night-coverage [email or web address] Password [email or web address]

## 2017-06-06 NOTE — Consult Note (Addendum)
   [facility] CM Inpatient Consult   06/06/2017  Miosotis Wetsel [birth year] [phone]    Patient screened for [facility] services. Spoke with Ms. Ishibashi and caregiver about re-engaging with [facility] Care Management. Discussed that Telephonic RNCM had been trying to contact her without success. Ms. Greenspan states she was having phone issues.   Caregiver advises that Probation officer should speak with patient's sister Watt Climes regarding [place] [employer] services. Caregiver states Watt Climes (sister) is a Education officer, museum and advised that Probation officer leave contact information and [facility] brochure for sister to review.   Ms. Olveda states she is not certain if [place] [employer] services are needed at this time. States she has 24/7 private duty caregiver assistance. States she has trouble getting to her MD appointments due to needing to be lifted to get into transportation [place]. Caregiver states [employer] is used for transportation.   Contact information, [facility] brochure, and 24-nurse advice line magnet provided.  Made inpatient RNCM aware that [place] [facility] services were declined at this time.   Marthenia Rolling, MSN-Ed, RN,BSN [facility] Liaison [phone]

## 2017-06-08 LAB — URINE CULTURE
Culture: >=100000 — AB
SPECIAL REQUESTS: NORMAL

## 2017-06-14 ENCOUNTER — Emergency Department (HOSPITAL_COMMUNITY)
Admission: EM | Admit: 2017-06-14 | Discharge: 2017-06-14 | Disposition: A | Discharge status: Home / Self Care | Payer: Medicare Other | Attending: Emergency Medicine | Admitting: Emergency Medicine

## 2017-06-14 ENCOUNTER — Emergency Department (HOSPITAL_COMMUNITY): Payer: Medicare Other

## 2017-06-14 DIAGNOSIS — Z853 Personal history of malignant neoplasm of breast: Secondary | ICD-10-CM | POA: Diagnosis not present

## 2017-06-14 DIAGNOSIS — I5031 Acute diastolic (congestive) heart failure: Secondary | ICD-10-CM | POA: Diagnosis not present

## 2017-06-14 DIAGNOSIS — I130 Hypertensive heart and chronic kidney disease with heart failure and stage 1 through stage 4 chronic kidney disease, or unspecified chronic kidney disease: Secondary | ICD-10-CM | POA: Diagnosis not present

## 2017-06-14 DIAGNOSIS — N183 Chronic kidney disease, stage 3 (moderate): Secondary | ICD-10-CM | POA: Insufficient documentation

## 2017-06-14 DIAGNOSIS — R0602 Shortness of breath: Secondary | ICD-10-CM | POA: Diagnosis not present

## 2017-06-14 DIAGNOSIS — J9610 Chronic respiratory failure, unspecified whether with hypoxia or hypercapnia: Secondary | ICD-10-CM | POA: Insufficient documentation

## 2017-06-14 DIAGNOSIS — E1122 Type 2 diabetes mellitus with diabetic chronic kidney disease: Secondary | ICD-10-CM | POA: Diagnosis not present

## 2017-06-14 DIAGNOSIS — Z79899 Other long term (current) drug therapy: Secondary | ICD-10-CM | POA: Diagnosis not present

## 2017-06-14 DIAGNOSIS — Z87891 Personal history of nicotine dependence: Secondary | ICD-10-CM | POA: Insufficient documentation

## 2017-06-14 DIAGNOSIS — Z794 Long term (current) use of insulin: Secondary | ICD-10-CM | POA: Insufficient documentation

## 2017-06-14 DIAGNOSIS — N3000 Acute cystitis without hematuria: Secondary | ICD-10-CM | POA: Diagnosis not present

## 2017-06-14 DIAGNOSIS — Z6841 Body Mass Index (BMI) 40.0 and over, adult: Secondary | ICD-10-CM | POA: Insufficient documentation

## 2017-06-14 DIAGNOSIS — R069 Unspecified abnormalities of breathing: Secondary | ICD-10-CM | POA: Diagnosis not present

## 2017-06-14 DIAGNOSIS — J80 Acute respiratory distress syndrome: Secondary | ICD-10-CM | POA: Diagnosis not present

## 2017-06-14 DIAGNOSIS — I5040 Unspecified combined systolic (congestive) and diastolic (congestive) heart failure: Secondary | ICD-10-CM | POA: Diagnosis not present

## 2017-06-14 LAB — BASIC METABOLIC PANEL
ANION GAP: 10 (ref 5–15)
BUN: 41 mg/dL — ABNORMAL HIGH (ref 6–20)
CALCIUM: 9.5 mg/dL (ref 8.9–10.3)
CO2: 40 mmol/L — ABNORMAL HIGH (ref 22–32)
CREATININE: 0.86 mg/dL (ref 0.44–1.00)
Chloride: 90 mmol/L — ABNORMAL LOW (ref 101–111)
Glucose, Bld: 167 mg/dL — ABNORMAL HIGH (ref 65–99)
Potassium: 4.4 mmol/L (ref 3.5–5.1)
Sodium: 140 mmol/L (ref 135–145)

## 2017-06-14 LAB — CBC
HCT: 38.5 % (ref 36.0–46.0)
Hemoglobin: 11.5 g/dL — ABNORMAL LOW (ref 12.0–15.0)
MCH: 29 pg (ref 26.0–34.0)
MCHC: 29.9 g/dL — AB (ref 30.0–36.0)
MCV: 97 fL (ref 78.0–100.0)
PLATELETS: 220 10*3/uL (ref 150–400)
RBC: 3.97 MIL/uL (ref 3.87–5.11)
RDW: 12.2 % (ref 11.5–15.5)
WBC: 9.4 10*3/uL (ref 4.0–10.5)

## 2017-06-14 MED ORDER — CEPHALEXIN 500 MG PO CAPS: 500.0000 mg | ORAL_CAPSULE | Freq: Three times a day (TID) | ORAL | 0 refills | Status: DC

## 2017-06-14 NOTE — ED Triage Notes (Signed)
Per EMS, pt is coming from home with complaints of SOB and low oxygen saturation. EMS states that pt was on her bipap machine on arrival and the saturation was in the 70s. Pt was then placed on 5L and the O2 sat increased to 93%. Pt has a hx of CHF, diabetes, hypertension, and OSA. Pt is AO x4 and is bedbound. Pt has her home health nurse at bedside.

## 2017-06-14 NOTE — ED Notes (Signed)
[facility] called  

## 2017-06-14 NOTE — ED Provider Notes (Signed)
[facility] DEPT Provider Note   CSN: [id number] Arrival date & time: 06/14/17  1217     History   Chief Complaint Chief Complaint  Patient presents with  . Shortness of Breath  . Low oxygen saturation    HPI Makayla Rodriguez is a 84 y.o. female.  HPI Patient is an 84 year old female with a history of chronic respiratory failure and obesity hypoventilation syndrome who wears BiPAP at night.  She is on nasal cannula oxygen through the day.  Staff members report that she has not been wanting to wear her BiPAP at night and this morning when the a.m. staff member arrived the patient seems somewhat "dusky" and altered.  She was noted not to be wearing her BiPAP.  The staff member placed her back on BiPAP and contacted the patient's primary care physician who recommended that she come to the ER for evaluation.  Staff member reports that within an hour the patient returned to baseline mental status and that her color had returned.  No recent upper respiratory symptoms.  No fever.  No productive cough.  Patient is without complaints at this time.  Staff member reports baseline mental status and normal respiratory effort.   Past Medical History:  Diagnosis Date  . Anxiety   . Arthritis   . CHF (congestive heart failure) ([place])    12/13 admission  . CKD (chronic kidney disease), stage III ([place])   . Complication of anesthesia    O2 sat drop during  and went into coma  . Diabetes mellitus without complication ([place])   . GERD (gastroesophageal reflux disease)   . Hypertension   . Obesity   . Obesity hypoventilation syndrome ([place])    uses O2 at home  . Seasonal allergies   . Shortness of breath    with annxiety attack  . Sleep apnea    CPAP    Patient Active Problem List   Diagnosis Date Noted  . Acute and chronic respiratory failure with hypercapnia ([place]) 06/02/2017  . Chronic diastolic HF (heart failure) ([place])   . Elevated troponin 03/18/2017  .  Depression 03/18/2017  . HLD (hyperlipidemia) 03/18/2017  . CKD (chronic kidney disease), stage III ([place])   . DNR (do not resuscitate) discussion   . Palliative care by specialist   . Acute exacerbation of CHF (congestive heart failure) ([place]) 12/19/2016  . Type II diabetes mellitus with renal manifestations ([place])   . Hyperlipidemia   . Morbid obesity with body mass index of 45.0-49.9 in adult [facility]) 09/20/2014  . Acute on chronic respiratory failure with hypercapnia ([place])   . Dyspnea 09/28/2013  . Allergic rhinitis 09/11/2013  . Cancer of upper-outer quadrant of female breast ([place]) 03/07/2013  . OSA (obstructive sleep apnea) 08/09/2012  . Obesity hypoventilation syndrome ([place]) 08/09/2012  . Pleural mass 05/15/2012  . Acute on chronic diastolic CHF (congestive heart failure) ([place]) 05/11/2012  . Diabetes mellitus ([place]) 05/11/2012  . OA (osteoarthritis) of ankle 05/11/2012  . Essential hypertension 05/11/2012  . Morbid obesity ([place]) 05/11/2012    Past Surgical History:  Procedure Laterality Date  .  fybroid tumors    . BREAST SURGERY    . CHOLECYSTECTOMY    . EYE SURGERY     cataracts  . HERNIA REPAIR     umbilical  . PARTIAL MASTECTOMY WITH NEEDLE LOCALIZATION Left 04/05/2013   Procedure: PARTIAL MASTECTOMY WITH NEEDLE LOCALIZATION;  Surgeon: Adin Hector, MD;  Location: [facility];  Service: General;  Laterality: Left;  .  PILONIDAL CYST EXCISION     buttock  . TONSILLECTOMY      OB History    No data available       Home Medications    Prior to Admission medications   Medication Sig Start Date End Date Taking? Authorizing Provider  acetaminophen (TYLENOL) 500 MG tablet Take 500 mg by mouth every 6 (six) hours as needed for mild pain.    Yes [provider]  albuterol (PROVENTIL HFA;VENTOLIN HFA) 108 (90 Base) MCG/ACT inhaler Inhale 2 puffs into the lungs every 6 (six) hours as needed for wheezing or shortness of breath.   Yes [provider]  aspirin  EC 81 MG tablet Take 81 mg by mouth every morning.   Yes [provider]  atorvastatin (LIPITOR) 10 MG tablet Take 10 mg by mouth at bedtime. Reported on 05/14/2015 07/22/14  Yes [provider]  cetirizine (ZYRTEC) 10 MG tablet Take 10 mg by mouth daily. Reported on 05/14/2015   Yes [provider]  cholecalciferol (VITAMIN D) 1000 UNITS tablet Take 1,000 Units by mouth every morning. Reported on 05/14/2015   Yes [provider]  CINNAMON PO Take 1 capsule by mouth at bedtime.   Yes [provider]  diclofenac sodium (VOLTAREN) 1 % GEL Apply 4 g topically 2 (two) times daily as needed (for joint pain).    Yes [provider]  Dietary Management Product (TOZAL PO) Take 1-2 tablets by mouth 2 (two) times daily. 2 in the morning 1 in the evening   Yes [provider]  fluticasone (FLONASE) 50 MCG/ACT nasal spray Place 2 sprays into the nose at bedtime.    Yes [provider]  guaiFENesin (MUCINEX) 600 MG 12 hr tablet Take 1 tablet (600 mg total) by mouth 2 (two) times daily. Patient taking differently: Take 1,200 mg by mouth 2 (two) times daily.  06/06/17 06/06/18 Yes Georgette Shell, MD  HUMALOG 100 UNIT/ML injection Inject 2-10 Units into the skin 2 (two) times daily before lunch and supper. Per sliding scale: 150-200 2 units, 201-250, 4 units, 251-300 8 units, 301-350 10 units, >350 call doctor 01/19/17  Yes [provider]  hydroxypropyl methylcellulose (ISOPTO TEARS) 2.5 % ophthalmic solution Place 1 drop into both eyes 3 (three) times daily as needed (for dry eyes).   Yes [provider]  ipratropium-albuterol (DUONEB) 0.5-2.5 (3) MG/3ML SOLN Take 3 mLs by nebulization 3 (three) times daily. Patient taking differently: Take 3 mLs by nebulization 3 (three) times daily as needed (breathing).  03/20/17  Yes Barton Dubois, MD  LANTUS 100 UNIT/ML injection Inject 10 Units into the skin at bedtime. 01/19/17  Yes  [provider]  magnesium hydroxide (MILK OF MAGNESIA) 400 MG/5ML suspension Take 15 mLs by mouth daily as needed for mild constipation.   Yes [provider]  nebivolol (BYSTOLIC) 5 MG tablet Take 5 mg by mouth daily. 05/20/12  Yes Barton Dubois, MD  Omega-3 Fatty Acids (OMEGA 3 PO) Take 1 capsule by mouth every morning.   Yes [provider]  senna (SENOKOT) 8.6 MG TABS tablet Take 1 tablet by mouth at bedtime.   Yes [provider]  sertraline (ZOLOFT) 50 MG tablet Take 100 mg by mouth daily.   Yes [provider]  sodium chloride (OCEAN) 0.65 % SOLN nasal spray Place 1 spray into both nostrils as needed for congestion. 04/16/14  Yes Ollis, [name] L, NP  torsemide (DEMADEX) 20 MG tablet Take 3 tablets (60  mg total) by mouth daily. Patient taking differently: Take 40 mg by mouth daily.  12/26/16  Yes Florencia Reasons, MD  traMADol (ULTRAM) 50 MG tablet Take 1 tablet (50 mg total) by mouth every 12 (twelve) hours as needed (for pain.). Patient taking differently: Take 50 mg by mouth every 12 (twelve) hours as needed for moderate pain.  03/20/17  Yes Barton Dubois, MD  azelastine (ASTELIN) 0.1 % nasal spray Place 2 sprays into both nostrils 2 (two) times daily. Use in each nostril as directed Patient not taking: Reported on 06/14/2017 04/16/14   Donita Brooks, NP  cephALEXin (KEFLEX) 500 MG capsule Take 1 capsule (500 mg total) by mouth 3 (three) times daily. 06/14/17   Jola Schmidt, MD    Family History Family History  Problem Relation Age of Onset  . Heart failure Mother   . Gallbladder disease Father   . Pancreatic cancer Sister     Social History Social History   Tobacco Use  . Smoking status: Former Smoker    Packs/day: 0.50    Years: 40.00    Pack years: 20.00    Types: Cigarettes    Last attempt to quit: 05/30/1980    Years since quitting: 37.0  . Smokeless tobacco: Never Used  Substance Use Topics  . Alcohol use: No  . Drug use: No       Allergies   Demerol [meperidine]; Penicillins; Phenergan [promethazine hcl]; Ativan [lorazepam]; Codeine; Macrobid [nitrofurantoin]; and Relafen [nabumetone]   Review of Systems Review of Systems  All other systems reviewed and are negative.    Physical Exam Updated Vital Signs BP (!) 141/66   Pulse 67   Temp 98.3 F (36.8 C) (Oral)   Resp 17   SpO2 91%   Physical Exam  Constitutional: She is oriented to person, place, and time. She appears well-developed and well-nourished. No distress.  HENT:  Head: Normocephalic and atraumatic.  Eyes: EOM are normal.  Neck: Normal range of motion.  Cardiovascular: Normal rate, regular rhythm and normal heart sounds.  Pulmonary/Chest: Effort normal and breath sounds normal.  Abdominal: Soft. She exhibits no distension. There is no tenderness.  Musculoskeletal: Normal range of motion.  Neurological: She is alert and oriented to person, place, and time.  Skin: Skin is warm and dry.  Psychiatric: She has a normal mood and affect. Judgment normal.  Nursing note and vitals reviewed.    ED Treatments / Results  Labs (all labs ordered are listed, but only abnormal results are displayed) Labs Reviewed  CBC - Abnormal; Notable for the following components:      Result Value   Hemoglobin 11.5 (*)    MCHC 29.9 (*)    All other components within normal limits  BASIC METABOLIC PANEL - Abnormal; Notable for the following components:   Chloride 90 (*)    CO2 40 (*)    Glucose, Bld 167 (*)    BUN 41 (*)    All other components within normal limits    EKG  EKG Interpretation None       Radiology Dg Chest 2 View  Result Date: 06/14/2017 CLINICAL DATA:  Shortness of Breath EXAM: CHEST  2 VIEW COMPARISON:  June 04, 2017 FINDINGS: There is patchy atelectasis in both lower lung zones. Lungs elsewhere or clear. Heart is upper normal in size with pulmonary vascularity within normal limits. No adenopathy. No bone lesions. IMPRESSION:  Lower lobe atelectatic change bilaterally. A degree of superimposed pneumonia in these areas cannot be  entirely excluded. Stable cardiac silhouette. Electronically Signed   By: Lowella Grip III M.D.   On: 06/14/2017 14:33    Procedures Procedures (including critical care time)  Medications Ordered in ED Medications - No data to display   Initial Impression / Assessment and Plan / ED Course  I have reviewed the triage vital signs and the nursing notes.  Pertinent labs & imaging results that were available during my care of the patient were reviewed by me and considered in my medical decision making (see chart for details).     Patient is return to baseline mental status.  Her oxygen saturation is 94% normal.  Staff member reports baseline respiratory effort.  Atelectasis noted on chest x-ray.  Low suspicion for pneumonia.  All of this is likely secondary to noncompliance with her BiPAP at night.  On arrival to emergency department her respiratory failure seemed stable.  Her prior hospitalist came down as she noted she was in the emergency department read that she looked good and was baseline per her.  Recent urine culture during her hospitalization demonstrated cephalosporin sensitive E. coli.  Patient was started on Keflex at the request of the hospitalist service.  Close primary care follow-up.  I recommended that her home health agency, and check her BiPAP for appropriate fit and function.  Attempted to reinforce the importance of compliance with her BiPAP at night  Final Clinical Impressions(s) / ED Diagnoses   Final diagnoses:  Chronic respiratory failure, unspecified whether with hypoxia or hypercapnia (HCC)  Acute cystitis without hematuria    ED Discharge Orders        Ordered    cephALEXin (KEFLEX) 500 MG capsule  3 times daily     06/14/17 1549       Jola Schmidt, MD 06/14/17 2308

## 2017-06-15 ENCOUNTER — Telehealth: Payer: Self-pay | Admitting: *Deleted

## 2017-06-15 NOTE — Telephone Encounter (Signed)
Pharmacy called related to Rx: Keflex (pt has allergy) .Marland KitchenMarland KitchenEDCM clarified with EDP ([place]) to change Rx to: Bactrim DS, take 1 tablet BID x 7 days #14.

## 2017-06-19 DIAGNOSIS — E662 Morbid (severe) obesity with alveolar hypoventilation: Secondary | ICD-10-CM | POA: Diagnosis not present

## 2017-06-19 DIAGNOSIS — I890 Lymphedema, not elsewhere classified: Secondary | ICD-10-CM | POA: Diagnosis not present

## 2017-06-19 DIAGNOSIS — J449 Chronic obstructive pulmonary disease, unspecified: Secondary | ICD-10-CM | POA: Diagnosis not present

## 2017-06-19 DIAGNOSIS — I10 Essential (primary) hypertension: Secondary | ICD-10-CM | POA: Diagnosis not present

## 2017-06-19 DIAGNOSIS — E1121 Type 2 diabetes mellitus with diabetic nephropathy: Secondary | ICD-10-CM | POA: Diagnosis not present

## 2017-06-19 DIAGNOSIS — Z794 Long term (current) use of insulin: Secondary | ICD-10-CM | POA: Diagnosis not present

## 2017-06-19 DIAGNOSIS — D649 Anemia, unspecified: Secondary | ICD-10-CM | POA: Diagnosis not present

## 2017-06-19 DIAGNOSIS — E1165 Type 2 diabetes mellitus with hyperglycemia: Secondary | ICD-10-CM | POA: Diagnosis not present

## 2017-06-19 DIAGNOSIS — J9612 Chronic respiratory failure with hypercapnia: Secondary | ICD-10-CM | POA: Diagnosis not present

## 2017-06-19 DIAGNOSIS — I5030 Unspecified diastolic (congestive) heart failure: Secondary | ICD-10-CM | POA: Diagnosis not present

## 2017-06-19 DIAGNOSIS — K219 Gastro-esophageal reflux disease without esophagitis: Secondary | ICD-10-CM | POA: Diagnosis not present

## 2017-07-06 ENCOUNTER — Encounter: Payer: Self-pay | Admitting: Acute Care

## 2017-07-06 ENCOUNTER — Ambulatory Visit (INDEPENDENT_AMBULATORY_CARE_PROVIDER_SITE_OTHER): Payer: Medicare Other | Admitting: Acute Care

## 2017-07-06 DIAGNOSIS — E662 Morbid (severe) obesity with alveolar hypoventilation: Secondary | ICD-10-CM | POA: Diagnosis not present

## 2017-07-06 NOTE — Assessment & Plan Note (Signed)
2 recent hospitalizations 2/2 hypercapnic respiratory failure BiPAP non-compliance Plan: Continue Duonebs 3 times daily Continue using Proventil as needed for breakthrough shortness of breath up to 4 times daily Continue on BiPAP at bedtime. You appear to be benefiting from the treatment Goal is to wear for at least 6 hours each night for maximal clinical benefit. Continue to work on weight loss, as the link between excess weight  and sleep apnea is well established.  Do not drive if sleepy. Remember to clean mask, tubing, filter, and reservoir once weekly with soapy water.  We will request [facility] come and evaluate your mask/ machine. We will enroll in [employer] Remember that you need to wear your BiPap every night without fail to avoid further hospitalizations. Follow up with Dr. Elsworth Soho  In  6 months. or before as needed.  Please contact office for sooner follow up if symptoms do not improve or worsen or seek emergency care

## 2017-07-06 NOTE — Progress Notes (Addendum)
History of Present Illness Makayla Rodriguez is a 84 y.o. female former smoker (quit 1982 with a 20-pack-year smoking history) with a history of HTN, HLD, CHF, OSA, OHS,  chronic hypoxemic respiratory failure on 2-3 L O2. She is followed by Dr. Elsworth Soho.   07/06/2017 OV for oxygen qualification. Pt. Presents to the office in wheel chair on oxygen  for oxygen qualification. She states she is compliant with her BiPAP  every night.  Her caregiver confirms this however states that the patient does have issues with mask leak at night.  We have had the patient bring her machine today so we can obtain a Down Load.  Patient states she is compliant with her duo nebs however is only using it twice daily instead of 3 times daily.  She states she is compliant with her pro-air but rarely needs to use it.  She states she is compliant with her Flonase each night.  Per her caregiver who is here today with her she is nonambulatory.  She wears her oxygen at 4 L nasal cannula at rest.  The patient was recently admitted to the hospital June 02, 2017 through June 06, 2017 for 3 days of somnolence and hypercarbic respiratory failure.  She was treated with DuoNeb, IV Lasix, and BiPAP.  She was admitted to a stepdown unit for ongoing evaluation.  Chronic hypercarbic respiratory failure was secondary to OSA/OHS and also possible acute on chronic diastolic heart failure.  She had an additional ER visit on June 14, 2017, for hypercapnic respiratory failure most likely secondary to noncompliance with her BiPAP at night.  She also had a UTI and was started on Keflex after this admission.  Patient denies fever, chest pain, orthopnea, or hemoptysis.  We discussed with the patient the importance of utilizing her BiPAP every night without fail.  We also discussed use of BiPAP during daily.  Patient continues to complain about her mask.  Per patient's caregivers she is nonambulatory and does not exert herself.  They are very aware of  monitoring her for confusion as an indication of acute on chronic hypercarbic respiratory failure.  Blood gases on admission to the hospital confirmed chronic hypercapnia.  Last admission 06/03/2017 patient was hypercapnic with a PCO2 of 79 despite BiPAP use.  Patient will not survive without her BiPAP machine.  BiPAP is an absolute necessity to the patient's survival.  Test Results: Oxygen qualification 07/06/2017 Patient Saturations on Room Air  = 87%  Patient Saturations on 4 Liters  94%  BiPAP download 06/06/2017-2/6/2-19 Air Curve 10 ST IPAP 12 cm H2O EPAP 6 cm H2O Usage days 30 out of 30 or 100% Greater than 4 hours 29 days were 97% Less than 4 hours 1 day or 3% AHI 10.7 Original AHI was 97 therefore this can be considered therapeutic.        PSG 06/2012 >>AHI 98.7, SpO2 low 58%. Study done with 2 liters oxygen.   ECHO 12/19/16 with mild RA dilation, no mention of PH  CBC Latest Ref Rng & Units 06/14/2017 06/04/2017 06/02/2017  WBC 4.0 - 10.5 K/uL 9.4 8.9 10.1  Hemoglobin 12.0 - 15.0 g/dL 11.5(L) 11.5(L) 12.8  Hematocrit 36.0 - 46.0 % 38.5 38.0 42.1  Platelets 150 - 400 K/uL 220 183 214    BMP Latest Ref Rng & Units 06/14/2017 06/06/2017 06/05/2017  Glucose 65 - 99 mg/dL 167(H) 181(H) 158(H)  BUN 6 - 20 mg/dL 41(H) 72(H) 70(H)  Creatinine 0.44 - 1.00 mg/dL 0.86 1.21(H) 1.43(H)  Sodium 135 - 145 mmol/L 140 138 139  Potassium 3.5 - 5.1 mmol/L 4.4 4.5 4.3  Chloride 101 - 111 mmol/L 90(L) 90(L) 88(L)  CO2 22 - 32 mmol/L 40(H) 40(H) 41(H)  Calcium 8.9 - 10.3 mg/dL 9.5 9.3 9.7    BNP    Component Value Date/Time   BNP 259.3 (H) 06/02/2017 1931    ProBNP    Component Value Date/Time   PROBNP 976.0 (H) 04/15/2014 0410    PFT No results found for: FEV1PRE, FEV1POST, FVCPRE, FVCPOST, TLC, DLCOUNC, PREFEV1FVCRT, PSTFEV1FVCRT  Dg Chest 2 View  Result Date: 06/14/2017 CLINICAL DATA:  Shortness of Breath EXAM: CHEST  2 VIEW COMPARISON:  June 04, 2017 FINDINGS: There is  patchy atelectasis in both lower lung zones. Lungs elsewhere or clear. Heart is upper normal in size with pulmonary vascularity within normal limits. No adenopathy. No bone lesions. IMPRESSION: Lower lobe atelectatic change bilaterally. A degree of superimposed pneumonia in these areas cannot be entirely excluded. Stable cardiac silhouette. Electronically Signed   By: Lowella Grip III M.D.   On: 06/14/2017 14:33     Past medical hx Past Medical History:  Diagnosis Date  . Anxiety   . Arthritis   . CHF (congestive heart failure) ([place])    12/13 admission  . CKD (chronic kidney disease), stage III ([place])   . Complication of anesthesia    O2 sat drop during  and went into coma  . Diabetes mellitus without complication ([place])   . GERD (gastroesophageal reflux disease)   . Hypertension   . Obesity   . Obesity hypoventilation syndrome ([facility])    uses O2 at home  . Seasonal allergies   . Shortness of breath    with annxiety attack  . Sleep apnea    CPAP     Social History   Tobacco Use  . Smoking status: Former Smoker    Packs/day: 0.50    Years: 40.00    Pack years: 20.00    Types: Cigarettes    Last attempt to quit: 05/30/1980    Years since quitting: 37.1  . Smokeless tobacco: Never Used  Substance Use Topics  . Alcohol use: No  . Drug use: No    Makayla Rodriguez reports that she quit smoking about 37 years ago. Her smoking use included cigarettes. She has a 20.00 pack-year smoking history. she has never used smokeless tobacco. She reports that she does not drink alcohol or use drugs.  Tobacco Cessation: Former smoker, quit January 1982 with a 20-pack-year smoking history  Past surgical hx, Family hx, Social hx all reviewed.  Current Outpatient Medications on File Prior to Visit  Medication Sig  . acetaminophen (TYLENOL) 500 MG tablet Take 500 mg by mouth every 6 (six) hours as needed for mild pain.   Marland Kitchen albuterol (PROVENTIL HFA;VENTOLIN HFA) 108 (90 Base) MCG/ACT inhaler Inhale  2 puffs into the lungs every 6 (six) hours as needed for wheezing or shortness of breath.  Marland Kitchen aspirin EC 81 MG tablet Take 81 mg by mouth every morning.  Marland Kitchen atorvastatin (LIPITOR) 10 MG tablet Take 10 mg by mouth at bedtime. Reported on 05/14/2015  . azelastine (ASTELIN) 0.1 % nasal spray Place 2 sprays into both nostrils 2 (two) times daily. Use in each nostril as directed  . cetirizine (ZYRTEC) 10 MG tablet Take 10 mg by mouth daily. Reported on 05/14/2015  . cholecalciferol (VITAMIN D) 1000 UNITS tablet Take 1,000 Units by mouth every morning. Reported on 05/14/2015  . CINNAMON  PO Take 1 capsule by mouth at bedtime.  . diclofenac sodium (VOLTAREN) 1 % GEL Apply 4 g topically 2 (two) times daily as needed (for joint pain).   . Dietary Management Product (TOZAL PO) Take 1-2 tablets by mouth 2 (two) times daily. 2 in the morning 1 in the evening  . fluticasone (FLONASE) 50 MCG/ACT nasal spray Place 2 sprays into the nose at bedtime.   Marland Kitchen guaiFENesin (MUCINEX) 600 MG 12 hr tablet Take 1 tablet (600 mg total) by mouth 2 (two) times daily. (Patient taking differently: Take 1,200 mg by mouth 2 (two) times daily. )  . HUMALOG 100 UNIT/ML injection Inject 2-10 Units into the skin 2 (two) times daily before lunch and supper. Per sliding scale: 150-200 2 units, 201-250, 4 units, 251-300 8 units, 301-350 10 units, >350 call doctor  . hydroxypropyl methylcellulose (ISOPTO TEARS) 2.5 % ophthalmic solution Place 1 drop into both eyes 3 (three) times daily as needed (for dry eyes).  Marland Kitchen ipratropium-albuterol (DUONEB) 0.5-2.5 (3) MG/3ML SOLN Take 3 mLs by nebulization 3 (three) times daily. (Patient taking differently: Take 3 mLs by nebulization 3 (three) times daily as needed (breathing). )  . LANTUS 100 UNIT/ML injection Inject 10 Units into the skin at bedtime.  . magnesium hydroxide (MILK OF MAGNESIA) 400 MG/5ML suspension Take 15 mLs by mouth daily as needed for mild constipation.  . nebivolol (BYSTOLIC) 5 MG  tablet Take 5 mg by mouth daily.  . Omega-3 Fatty Acids (OMEGA 3 PO) Take 1 capsule by mouth every morning.  . pantoprazole (PROTONIX) 40 MG tablet Take 40 mg by mouth daily.  Marland Kitchen senna (SENOKOT) 8.6 MG TABS tablet Take 1 tablet by mouth at bedtime.  . sertraline (ZOLOFT) 50 MG tablet Take 100 mg by mouth daily.  . sodium chloride (OCEAN) 0.65 % SOLN nasal spray Place 1 spray into both nostrils as needed for congestion.  . torsemide (DEMADEX) 20 MG tablet Take 3 tablets (60 mg total) by mouth daily. (Patient taking differently: Take 40 mg by mouth daily. )  . traMADol (ULTRAM) 50 MG tablet Take 1 tablet (50 mg total) by mouth every 12 (twelve) hours as needed (for pain.). (Patient taking differently: Take 50 mg by mouth every 12 (twelve) hours as needed for moderate pain. )   No current facility-administered medications on file prior to visit.      Allergies  Allergen Reactions  . Demerol [Meperidine] Anaphylaxis    Reaction: Hypoventilation after surgery  . Penicillins Anaphylaxis and Swelling    Has patient had a PCN reaction causing immediate rash, facial/tongue/throat swelling, SOB or lightheadedness with hypotension: Yes Has patient had a PCN reaction causing severe rash involving mucus membranes or skin necrosis: No Has patient had a PCN reaction that required hospitalization: Yes Has patient had a PCN reaction occurring within the last 10 years: No If all of the above answers are "NO", then may proceed with Cephalosporin use.   Marland Kitchen Phenergan [Promethazine Hcl] Anaphylaxis    Reaction: hypoventilation  . Ativan [Lorazepam]     Stopped breathing  . Codeine Nausea And Vomiting  . Macrobid [Nitrofurantoin] Nausea And Vomiting  . Relafen [Nabumetone] Itching and Swelling    Review Of Systems:  Constitutional:   No  weight loss, night sweats,  Fevers, chills, +fatigue, or  lassitude.  HEENT:   No headaches,  Difficulty swallowing,  Tooth/dental problems, or  Sore throat,  No sneezing, itching, ear ache, nasal congestion, post nasal drip,   CV:  No chest pain,  Orthopnea, PND, +swelling in lower extremities, anasarca, dizziness, palpitations, syncope.   GI  No heartburn, indigestion, abdominal pain, nausea, vomiting, diarrhea, change in bowel habits, loss of appetite, bloody stools.   Resp: + shortness of breath with exertion or at rest.  No excess mucus, no productive cough,  No non-productive cough,  No coughing up of blood.  No change in color of mucus.  No wheezing.  No chest wall deformity  Skin: no rash or lesions.  GU: no dysuria, change in color of urine, no urgency or frequency.  No flank pain, no hematuria   MS:  + joint pain or swelling.  + decreased range of motion.  + back pain.  Psych: Patient appears depressed, somewhat sarcastic about recent hospitalizations.   Vital Signs BP 120/70 (BP Location: Right Arm, Cuff Size: Normal)   Pulse 88   SpO2 97%    Physical Exam:  General- No distress,  A&Ox3, obese female, wheelchair-bound, wearing nasal oxygen ENT: No sinus tenderness, TM clear, pale nasal mucosa, no oral exudate,no post nasal drip, no LAN Cardiac: S1, S2, regular rate and rhythm, no murmur Chest: + wheeze/ rales/ dullness; + occasional  accessory muscle use with conversation, no nasal flaring, no sternal retractions, diminished per bases Abd.: Soft Non-tender, bowel sounds positive, nondistended, obese Ext: No clubbing cyanosis, 1-2+ BLE edema Neuro: Profoundly deconditioned, wheelchair-bound, alert and oriented x3, moving all extremities x4 Skin: No rashes, warm and dry Psych: normal mood and behavior, appears depressed at times based on comments she made   Assessment/Plan  Obesity hypoventilation syndrome ([place]) 2 recent hospitalizations 2/2 hypercapnic respiratory failure BiPAP non-compliance Plan: Continue Duonebs 3 times daily Continue using Proventil as needed for breakthrough shortness of breath up to 4 times  daily Continue on BiPAP at bedtime. You appear to be benefiting from the treatment Goal is to wear for at least 6 hours each night for maximal clinical benefit. Continue to work on weight loss, as the link between excess weight  and sleep apnea is well established.  Do not drive if sleepy. Remember to clean mask, tubing, filter, and reservoir once weekly with soapy water.  We will request [employer] come and evaluate your mask/ machine. We will enroll in [employer] Remember that you need to wear your BiPap every night without fail to avoid further hospitalizations. Follow up with Dr. Elsworth Soho  In  6 months. or before as needed.  Please contact office for sooner follow up if symptoms do not improve or worsen or seek emergency care      Magdalen Spatz, NP 07/06/2017  6:36 PM

## 2017-07-06 NOTE — Patient Instructions (Addendum)
It is nice to see today. Continue Duonebs 3 times daily Continue using Proventil as needed for breakthrough shortness of breath up to 4 times daily You qualified for oxygen today. You dropped to below 88% as soon as we removed your oxygen.. Continue on BiPAP at bedtime. You appear to be benefiting from the treatment Goal is to wear for at least 6 hours each night for maximal clinical benefit. Continue to work on weight loss, as the link between excess weight  and sleep apnea is well established.  Do not drive if sleepy. Remember to clean mask, tubing, filter, and reservoir once weekly with soapy water.  We will request [facility] come and evaluate your mask/ machine. We will enroll in [employer] Follow up with Dr. Elsworth Soho  In  6 months. or before as needed.  Please contact office for sooner follow up if symptoms do not improve or worsen or seek emergency care

## 2017-07-07 ENCOUNTER — Other Ambulatory Visit: Payer: Self-pay

## 2017-07-07 ENCOUNTER — Emergency Department (HOSPITAL_COMMUNITY): Payer: Medicare Other

## 2017-07-07 ENCOUNTER — Inpatient Hospital Stay (HOSPITAL_COMMUNITY): Payer: Medicare Other

## 2017-07-07 ENCOUNTER — Encounter (HOSPITAL_COMMUNITY): Payer: Self-pay | Admitting: *Deleted

## 2017-07-07 ENCOUNTER — Inpatient Hospital Stay (HOSPITAL_COMMUNITY)
Admission: EM | Admit: 2017-07-07 | Discharge: 2017-07-16 | DRG: 208 | Disposition: A | Discharge status: Hospice | Payer: Medicare Other | Attending: Internal Medicine | Admitting: Internal Medicine

## 2017-07-07 DIAGNOSIS — Z515 Encounter for palliative care: Secondary | ICD-10-CM | POA: Diagnosis not present

## 2017-07-07 DIAGNOSIS — R0689 Other abnormalities of breathing: Secondary | ICD-10-CM

## 2017-07-07 DIAGNOSIS — R4020 Unspecified coma: Secondary | ICD-10-CM | POA: Diagnosis not present

## 2017-07-07 DIAGNOSIS — Z794 Long term (current) use of insulin: Secondary | ICD-10-CM

## 2017-07-07 DIAGNOSIS — N183 Chronic kidney disease, stage 3 unspecified: Secondary | ICD-10-CM | POA: Diagnosis present

## 2017-07-07 DIAGNOSIS — I5033 Acute on chronic diastolic (congestive) heart failure: Secondary | ICD-10-CM | POA: Diagnosis not present

## 2017-07-07 DIAGNOSIS — G4733 Obstructive sleep apnea (adult) (pediatric): Secondary | ICD-10-CM | POA: Diagnosis present

## 2017-07-07 DIAGNOSIS — J9690 Respiratory failure, unspecified, unspecified whether with hypoxia or hypercapnia: Secondary | ICD-10-CM | POA: Diagnosis not present

## 2017-07-07 DIAGNOSIS — T502X5A Adverse effect of carbonic-anhydrase inhibitors, benzothiadiazides and other diuretics, initial encounter: Secondary | ICD-10-CM | POA: Diagnosis present

## 2017-07-07 DIAGNOSIS — Z66 Do not resuscitate: Secondary | ICD-10-CM | POA: Diagnosis present

## 2017-07-07 DIAGNOSIS — Z885 Allergy status to narcotic agent status: Secondary | ICD-10-CM

## 2017-07-07 DIAGNOSIS — Z880 Allergy status to penicillin: Secondary | ICD-10-CM | POA: Diagnosis not present

## 2017-07-07 DIAGNOSIS — Z882 Allergy status to sulfonamides status: Secondary | ICD-10-CM

## 2017-07-07 DIAGNOSIS — G9341 Metabolic encephalopathy: Secondary | ICD-10-CM | POA: Diagnosis present

## 2017-07-07 DIAGNOSIS — E662 Morbid (severe) obesity with alveolar hypoventilation: Secondary | ICD-10-CM | POA: Diagnosis present

## 2017-07-07 DIAGNOSIS — E1159 Type 2 diabetes mellitus with other circulatory complications: Secondary | ICD-10-CM | POA: Diagnosis not present

## 2017-07-07 DIAGNOSIS — R0602 Shortness of breath: Secondary | ICD-10-CM

## 2017-07-07 DIAGNOSIS — I130 Hypertensive heart and chronic kidney disease with heart failure and stage 1 through stage 4 chronic kidney disease, or unspecified chronic kidney disease: Secondary | ICD-10-CM | POA: Diagnosis present

## 2017-07-07 DIAGNOSIS — R410 Disorientation, unspecified: Secondary | ICD-10-CM | POA: Diagnosis not present

## 2017-07-07 DIAGNOSIS — J9610 Chronic respiratory failure, unspecified whether with hypoxia or hypercapnia: Secondary | ICD-10-CM | POA: Diagnosis not present

## 2017-07-07 DIAGNOSIS — J440 Chronic obstructive pulmonary disease with acute lower respiratory infection: Secondary | ICD-10-CM | POA: Diagnosis present

## 2017-07-07 DIAGNOSIS — J301 Allergic rhinitis due to pollen: Secondary | ICD-10-CM | POA: Diagnosis not present

## 2017-07-07 DIAGNOSIS — E785 Hyperlipidemia, unspecified: Secondary | ICD-10-CM | POA: Diagnosis present

## 2017-07-07 DIAGNOSIS — J9621 Acute and chronic respiratory failure with hypoxia: Secondary | ICD-10-CM | POA: Diagnosis present

## 2017-07-07 DIAGNOSIS — J810 Acute pulmonary edema: Secondary | ICD-10-CM

## 2017-07-07 DIAGNOSIS — Z7189 Other specified counseling: Secondary | ICD-10-CM

## 2017-07-07 DIAGNOSIS — R069 Unspecified abnormalities of breathing: Secondary | ICD-10-CM | POA: Diagnosis not present

## 2017-07-07 DIAGNOSIS — J9811 Atelectasis: Secondary | ICD-10-CM | POA: Diagnosis not present

## 2017-07-07 DIAGNOSIS — J189 Pneumonia, unspecified organism: Secondary | ICD-10-CM | POA: Diagnosis present

## 2017-07-07 DIAGNOSIS — J9622 Acute and chronic respiratory failure with hypercapnia: Secondary | ICD-10-CM | POA: Diagnosis not present

## 2017-07-07 DIAGNOSIS — I361 Nonrheumatic tricuspid (valve) insufficiency: Secondary | ICD-10-CM | POA: Diagnosis not present

## 2017-07-07 DIAGNOSIS — Z9289 Personal history of other medical treatment: Secondary | ICD-10-CM

## 2017-07-07 DIAGNOSIS — Z9981 Dependence on supplemental oxygen: Secondary | ICD-10-CM

## 2017-07-07 DIAGNOSIS — M1990 Unspecified osteoarthritis, unspecified site: Secondary | ICD-10-CM | POA: Diagnosis present

## 2017-07-07 DIAGNOSIS — E1122 Type 2 diabetes mellitus with diabetic chronic kidney disease: Secondary | ICD-10-CM | POA: Diagnosis present

## 2017-07-07 DIAGNOSIS — Z79899 Other long term (current) drug therapy: Secondary | ICD-10-CM

## 2017-07-07 DIAGNOSIS — E1129 Type 2 diabetes mellitus with other diabetic kidney complication: Secondary | ICD-10-CM | POA: Diagnosis present

## 2017-07-07 DIAGNOSIS — Z6841 Body Mass Index (BMI) 40.0 and over, adult: Secondary | ICD-10-CM | POA: Diagnosis not present

## 2017-07-07 DIAGNOSIS — Z9119 Patient's noncompliance with other medical treatment and regimen: Secondary | ICD-10-CM

## 2017-07-07 DIAGNOSIS — I509 Heart failure, unspecified: Secondary | ICD-10-CM

## 2017-07-07 DIAGNOSIS — F419 Anxiety disorder, unspecified: Secondary | ICD-10-CM | POA: Diagnosis present

## 2017-07-07 DIAGNOSIS — J9691 Respiratory failure, unspecified with hypoxia: Secondary | ICD-10-CM | POA: Diagnosis not present

## 2017-07-07 DIAGNOSIS — K219 Gastro-esophageal reflux disease without esophagitis: Secondary | ICD-10-CM | POA: Diagnosis present

## 2017-07-07 DIAGNOSIS — N189 Chronic kidney disease, unspecified: Secondary | ICD-10-CM | POA: Diagnosis not present

## 2017-07-07 DIAGNOSIS — Z87891 Personal history of nicotine dependence: Secondary | ICD-10-CM

## 2017-07-07 DIAGNOSIS — I10 Essential (primary) hypertension: Secondary | ICD-10-CM | POA: Diagnosis present

## 2017-07-07 DIAGNOSIS — F339 Major depressive disorder, recurrent, unspecified: Secondary | ICD-10-CM | POA: Diagnosis not present

## 2017-07-07 DIAGNOSIS — Z4682 Encounter for fitting and adjustment of non-vascular catheter: Secondary | ICD-10-CM | POA: Diagnosis not present

## 2017-07-07 DIAGNOSIS — Z7982 Long term (current) use of aspirin: Secondary | ICD-10-CM

## 2017-07-07 DIAGNOSIS — J449 Chronic obstructive pulmonary disease, unspecified: Secondary | ICD-10-CM | POA: Diagnosis not present

## 2017-07-07 DIAGNOSIS — Z7401 Bed confinement status: Secondary | ICD-10-CM | POA: Diagnosis not present

## 2017-07-07 DIAGNOSIS — Z888 Allergy status to other drugs, medicaments and biological substances status: Secondary | ICD-10-CM

## 2017-07-07 DIAGNOSIS — E669 Obesity, unspecified: Secondary | ICD-10-CM | POA: Diagnosis not present

## 2017-07-07 DIAGNOSIS — J9600 Acute respiratory failure, unspecified whether with hypoxia or hypercapnia: Secondary | ICD-10-CM | POA: Diagnosis not present

## 2017-07-07 DIAGNOSIS — I5040 Unspecified combined systolic (congestive) and diastolic (congestive) heart failure: Secondary | ICD-10-CM | POA: Diagnosis not present

## 2017-07-07 LAB — CBC WITH DIFFERENTIAL/PLATELET
Basophils Absolute: 0 10*3/uL (ref 0.0–0.1)
Basophils Relative: 0 %
Eosinophils Absolute: 0 10*3/uL (ref 0.0–0.7)
Eosinophils Relative: 0 %
HEMATOCRIT: 42.3 % (ref 36.0–46.0)
HEMOGLOBIN: 12.5 g/dL (ref 12.0–15.0)
LYMPHS ABS: 1.5 10*3/uL (ref 0.7–4.0)
LYMPHS PCT: 14 %
MCH: 29.4 pg (ref 26.0–34.0)
MCHC: 29.6 g/dL — AB (ref 30.0–36.0)
MCV: 99.5 fL (ref 78.0–100.0)
MONOS PCT: 5 %
Monocytes Absolute: 0.5 10*3/uL (ref 0.1–1.0)
NEUTROS ABS: 8.8 10*3/uL — AB (ref 1.7–7.7)
NEUTROS PCT: 81 %
Platelets: 199 10*3/uL (ref 150–400)
RBC: 4.25 MIL/uL (ref 3.87–5.11)
RDW: 12.8 % (ref 11.5–15.5)
WBC: 10.8 10*3/uL — ABNORMAL HIGH (ref 4.0–10.5)

## 2017-07-07 LAB — BLOOD GAS, ARTERIAL
ACID-BASE EXCESS: 20.1 mmol/L — AB (ref 0.0–2.0)
BICARBONATE: 47.9 mmol/L — AB (ref 20.0–28.0)
DRAWN BY: 313061
FIO2: 100
O2 Saturation: 98.2 %
PATIENT TEMPERATURE: 98.6
pCO2 arterial: 112 mmHg (ref 32.0–48.0)
pH, Arterial: 7.253 — ABNORMAL LOW (ref 7.350–7.450)
pO2, Arterial: 133 mmHg — ABNORMAL HIGH (ref 83.0–108.0)

## 2017-07-07 LAB — I-STAT ARTERIAL BLOOD GAS, ED
ACID-BASE EXCESS: 20 mmol/L — AB (ref 0.0–2.0)
BICARBONATE: 49.1 mmol/L — AB (ref 20.0–28.0)
O2 Saturation: 96 %
PO2 ART: 88 mmHg (ref 83.0–108.0)
pCO2 arterial: 80.3 mmHg (ref 32.0–48.0)
pH, Arterial: 7.394 (ref 7.350–7.450)

## 2017-07-07 LAB — BASIC METABOLIC PANEL
ANION GAP: 13 (ref 5–15)
BUN: 51 mg/dL — ABNORMAL HIGH (ref 6–20)
CHLORIDE: 90 mmol/L — AB (ref 101–111)
CO2: 41 mmol/L — AB (ref 22–32)
Calcium: 10 mg/dL (ref 8.9–10.3)
Creatinine, Ser: 0.93 mg/dL (ref 0.44–1.00)
GFR calc non Af Amer: 55 mL/min — ABNORMAL LOW (ref >60)
Glucose, Bld: 154 mg/dL — ABNORMAL HIGH (ref 65–99)
POTASSIUM: 4.9 mmol/L (ref 3.5–5.1)
Sodium: 144 mmol/L (ref 135–145)

## 2017-07-07 LAB — I-STAT TROPONIN, ED: Troponin i, poc: 0.02 ng/mL (ref 0.00–0.08)

## 2017-07-07 LAB — GLUCOSE, CAPILLARY
Glucose-Capillary: 101 mg/dL — ABNORMAL HIGH (ref 65–99)
Glucose-Capillary: 92 mg/dL (ref 65–99)

## 2017-07-07 LAB — BRAIN NATRIURETIC PEPTIDE: B Natriuretic Peptide: 463.4 pg/mL — ABNORMAL HIGH (ref 0.0–100.0)

## 2017-07-07 LAB — CBG MONITORING, ED: Glucose-Capillary: 123 mg/dL — ABNORMAL HIGH (ref 65–99)

## 2017-07-07 LAB — MRSA PCR SCREENING: MRSA by PCR: NEGATIVE

## 2017-07-07 LAB — TRIGLYCERIDES: TRIGLYCERIDES: 90 mg/dL (ref <150)

## 2017-07-07 MED ORDER — MIDAZOLAM HCL 2 MG/2ML IJ SOLN
1.0000 mg | INTRAMUSCULAR | Status: DC | PRN
  Administered 2017-07-11 (×3): 1 mg via INTRAVENOUS
  Filled 2017-07-07 (×4): qty 2

## 2017-07-07 MED ORDER — CHLORHEXIDINE GLUCONATE 0.12% ORAL RINSE (MEDLINE KIT)
15.0000 mL | Freq: Two times a day (BID) | OROMUCOSAL | Status: DC
  Administered 2017-07-07 – 2017-07-15 (×14): 15 mL via OROMUCOSAL
  Filled 2017-07-07 (×2): qty 15

## 2017-07-07 MED ORDER — MIDAZOLAM HCL 2 MG/2ML IJ SOLN: 1.0000 mg | INTRAMUSCULAR | Status: DC | PRN

## 2017-07-07 MED ORDER — ENOXAPARIN SODIUM 40 MG/0.4ML ~~LOC~~ SOLN: 40.0000 mg | SUBCUTANEOUS | Status: DC

## 2017-07-07 MED ORDER — INSULIN ASPART 100 UNIT/ML ~~LOC~~ SOLN
0.0000 [IU] | SUBCUTANEOUS | Status: DC
  Administered 2017-07-07 – 2017-07-08 (×2): 2 [IU] via SUBCUTANEOUS
  Administered 2017-07-08: 5 [IU] via SUBCUTANEOUS
  Administered 2017-07-08 (×2): 2 [IU] via SUBCUTANEOUS
  Administered 2017-07-09 (×2): 3 [IU] via SUBCUTANEOUS
  Administered 2017-07-09: 8 [IU] via SUBCUTANEOUS
  Administered 2017-07-09 (×2): 5 [IU] via SUBCUTANEOUS
  Administered 2017-07-09: 8 [IU] via SUBCUTANEOUS
  Administered 2017-07-10 (×2): 5 [IU] via SUBCUTANEOUS
  Administered 2017-07-10: 3 [IU] via SUBCUTANEOUS
  Administered 2017-07-10 (×2): 5 [IU] via SUBCUTANEOUS
  Administered 2017-07-10: 8 [IU] via SUBCUTANEOUS
  Administered 2017-07-11 (×2): 3 [IU] via SUBCUTANEOUS
  Administered 2017-07-11: 5 [IU] via SUBCUTANEOUS
  Administered 2017-07-11: 3 [IU] via SUBCUTANEOUS
  Administered 2017-07-11: 5 [IU] via SUBCUTANEOUS
  Administered 2017-07-11: 3 [IU] via SUBCUTANEOUS
  Administered 2017-07-12: 2 [IU] via SUBCUTANEOUS
  Administered 2017-07-12: 3 [IU] via SUBCUTANEOUS
  Administered 2017-07-12: 5 [IU] via SUBCUTANEOUS
  Administered 2017-07-12: 3 [IU] via SUBCUTANEOUS
  Administered 2017-07-12: 5 [IU] via SUBCUTANEOUS
  Administered 2017-07-13 (×2): 3 [IU] via SUBCUTANEOUS
  Administered 2017-07-13: 5 [IU] via SUBCUTANEOUS
  Administered 2017-07-13: 3 [IU] via SUBCUTANEOUS
  Administered 2017-07-13: 8 [IU] via SUBCUTANEOUS
  Administered 2017-07-13: 3 [IU] via SUBCUTANEOUS
  Administered 2017-07-14 (×3): 2 [IU] via SUBCUTANEOUS
  Administered 2017-07-14: 3 [IU] via SUBCUTANEOUS
  Administered 2017-07-14 – 2017-07-15 (×2): 2 [IU] via SUBCUTANEOUS
  Administered 2017-07-15: 5 [IU] via SUBCUTANEOUS
  Administered 2017-07-16: 3 [IU] via SUBCUTANEOUS
  Administered 2017-07-16: 5 [IU] via SUBCUTANEOUS
  Filled 2017-07-07: qty 1

## 2017-07-07 MED ORDER — HYPROMELLOSE (GONIOSCOPIC) 2.5 % OP SOLN: 1.0000 [drp] | Freq: Three times a day (TID) | OPHTHALMIC | Status: DC | PRN

## 2017-07-07 MED ORDER — FENTANYL CITRATE (PF) 100 MCG/2ML IJ SOLN
50.0000 ug | INTRAMUSCULAR | Status: DC | PRN
  Administered 2017-07-08: 50 ug via INTRAVENOUS
  Filled 2017-07-07: qty 2

## 2017-07-07 MED ORDER — ETOMIDATE 2 MG/ML IV SOLN
INTRAVENOUS | Status: AC | PRN
  Administered 2017-07-07: 10 mg via INTRAVENOUS

## 2017-07-07 MED ORDER — VANCOMYCIN HCL 10 G IV SOLR
1250.0000 mg | INTRAVENOUS | Status: DC
  Administered 2017-07-08 – 2017-07-10 (×3): 1250 mg via INTRAVENOUS
  Filled 2017-07-07 (×4): qty 1250

## 2017-07-07 MED ORDER — IPRATROPIUM-ALBUTEROL 0.5-2.5 (3) MG/3ML IN SOLN: 3.0000 mL | Freq: Three times a day (TID) | RESPIRATORY_TRACT | Status: DC

## 2017-07-07 MED ORDER — FUROSEMIDE 10 MG/ML IJ SOLN: 80.0000 mg | Freq: Three times a day (TID) | INTRAMUSCULAR | Status: DC

## 2017-07-07 MED ORDER — CEFEPIME HCL 2 G IJ SOLR
2.0000 g | Freq: Three times a day (TID) | INTRAMUSCULAR | Status: DC
  Administered 2017-07-08 (×3): 2 g via INTRAVENOUS
  Filled 2017-07-07 (×5): qty 2

## 2017-07-07 MED ORDER — SODIUM CHLORIDE 0.9 % IV SOLN
INTRAVENOUS | Status: DC
  Administered 2017-07-07: 18:00:00 via INTRAVENOUS

## 2017-07-07 MED ORDER — PROPOFOL 1000 MG/100ML IV EMUL
5.0000 ug/kg/min | INTRAVENOUS | Status: DC
  Administered 2017-07-07: 5 ug/kg/min via INTRAVENOUS
  Administered 2017-07-07: 45 ug/kg/min via INTRAVENOUS
  Administered 2017-07-08: 30 ug/kg/min via INTRAVENOUS
  Administered 2017-07-08: 20 ug/kg/min via INTRAVENOUS
  Administered 2017-07-08: 30 ug/kg/min via INTRAVENOUS
  Administered 2017-07-08: 45 ug/kg/min via INTRAVENOUS
  Administered 2017-07-09: 20 ug/kg/min via INTRAVENOUS
  Administered 2017-07-09 (×2): 30 ug/kg/min via INTRAVENOUS
  Administered 2017-07-09 – 2017-07-11 (×5): 20 ug/kg/min via INTRAVENOUS
  Administered 2017-07-11: 5 ug/kg/min via INTRAVENOUS
  Administered 2017-07-12: 15 ug/kg/min via INTRAVENOUS
  Filled 2017-07-07 (×17): qty 100

## 2017-07-07 MED ORDER — INSULIN GLARGINE 100 UNIT/ML ~~LOC~~ SOLN: 10.0000 [IU] | Freq: Every day | SUBCUTANEOUS | Status: DC

## 2017-07-07 MED ORDER — HYDRALAZINE HCL 20 MG/ML IJ SOLN: 10.0000 mg | Freq: Three times a day (TID) | INTRAMUSCULAR | Status: DC | PRN

## 2017-07-07 MED ORDER — DEXTROSE 5 % IV SOLN
2.0000 g | Freq: Once | INTRAVENOUS | Status: AC
  Administered 2017-07-07: 2 g via INTRAVENOUS
  Filled 2017-07-07: qty 2

## 2017-07-07 MED ORDER — ORAL CARE MOUTH RINSE
15.0000 mL | OROMUCOSAL | Status: DC
  Administered 2017-07-08 – 2017-07-12 (×47): 15 mL via OROMUCOSAL

## 2017-07-07 MED ORDER — METOPROLOL TARTRATE 5 MG/5ML IV SOLN: 5.0000 mg | Freq: Three times a day (TID) | INTRAVENOUS | Status: DC

## 2017-07-07 MED ORDER — ALBUTEROL SULFATE (2.5 MG/3ML) 0.083% IN NEBU: 2.5000 mg | INHALATION_SOLUTION | Freq: Four times a day (QID) | RESPIRATORY_TRACT | Status: DC | PRN

## 2017-07-07 MED ORDER — DEXTROSE 5 % IV SOLN: 2.0000 g | Freq: Three times a day (TID) | INTRAVENOUS | Status: DC

## 2017-07-07 MED ORDER — SODIUM CHLORIDE 0.9 % IV SOLN
0.0000 ug/kg/h | INTRAVENOUS | Status: DC
  Filled 2017-07-07: qty 2

## 2017-07-07 MED ORDER — FAMOTIDINE IN NACL 20-0.9 MG/50ML-% IV SOLN
20.0000 mg | Freq: Two times a day (BID) | INTRAVENOUS | Status: DC
  Administered 2017-07-07 – 2017-07-13 (×12): 20 mg via INTRAVENOUS
  Filled 2017-07-07 (×11): qty 50

## 2017-07-07 MED ORDER — VANCOMYCIN HCL 10 G IV SOLR
2000.0000 mg | Freq: Once | INTRAVENOUS | Status: AC
  Administered 2017-07-07: 2000 mg via INTRAVENOUS
  Filled 2017-07-07: qty 2000

## 2017-07-07 MED ORDER — ALBUTEROL SULFATE (2.5 MG/3ML) 0.083% IN NEBU: 2.5000 mg | INHALATION_SOLUTION | RESPIRATORY_TRACT | Status: DC | PRN

## 2017-07-07 MED ORDER — IPRATROPIUM-ALBUTEROL 0.5-2.5 (3) MG/3ML IN SOLN
3.0000 mL | Freq: Four times a day (QID) | RESPIRATORY_TRACT | Status: DC
  Administered 2017-07-07 – 2017-07-10 (×13): 3 mL via RESPIRATORY_TRACT
  Filled 2017-07-07 (×13): qty 3

## 2017-07-07 MED ORDER — INSULIN ASPART 100 UNIT/ML ~~LOC~~ SOLN: 0.0000 [IU] | Freq: Three times a day (TID) | SUBCUTANEOUS | Status: DC

## 2017-07-07 MED ORDER — FUROSEMIDE 10 MG/ML IJ SOLN
80.0000 mg | Freq: Once | INTRAMUSCULAR | Status: AC
  Administered 2017-07-07: 80 mg via INTRAVENOUS
  Filled 2017-07-07: qty 8

## 2017-07-07 MED ORDER — PANTOPRAZOLE SODIUM 40 MG IV SOLR
40.0000 mg | Freq: Every day | INTRAVENOUS | Status: DC
  Administered 2017-07-07 – 2017-07-10 (×4): 40 mg via INTRAVENOUS
  Filled 2017-07-07 (×3): qty 40

## 2017-07-07 NOTE — Consult Note (Signed)
PULMONARY / CRITICAL CARE MEDICINE   Name: Christella App MRN: [id number] DOB: May 13, 1932    ADMISSION DATE:  07/07/2017 CONSULTATION DATE:  07/07/17  REFERRING MD:  Hospitalist    CHIEF COMPLAINT:  Altered mental status  HISTORY OF PRESENT ILLNESS:    Maxcine Strong is a 84 y.o. female with medical history significant of chronic respiratory failure driven by obesity and requiring use of BiPAP, CHF, CKD, OSA,  hypertension, GERD, diabetes mellitus who presented to the emergency department with confusion.  She has a history of chronic hyperbaric and hypoxic respiratory failure, is supposed to use supplementa oxygen during the day and stay on nocturnal BiPAP. However she is not compliant with this regimen.  She complains of oxygen leak from BiPAP and the home health nurses were trying to adjust the mask.  She was seen for the same issue yesterday at the [facility] pulmonology and and they request for [place] home visit was placed to assess patient's complain of leaking mask.  Patient is usually nonambulatory and in the morning she has some degree of confusion.  However today seems more confused   ED Course: On arrival patient was tachypneic with oxygen saturations 70-80%.  Her ABG showed hypoxia with pH 7.253 and PCO2 of 112 and pO2 133 on NR. Blood work demonstrated leukocytosis with white blood cell count 10.8 Chest x-ray stated bilateral interstitial edema and confluent opacity at the lateral aspects of the right lower lobe suspicious for pneumonia versus confluent pulmonary edema Patient was placed on BiPAP, given 80 mg of Lasix IV and started on IV vancomycin and cefepime.  We were contacted for evaluation due to the development of acute/chronic hypercapnic respiratory failure with encephalopathy. Patient required intubation and placement in the icu.  PAST MEDICAL HISTORY :  She  has a past medical history of Anxiety, Arthritis, CHF (congestive heart failure) ([place]), CKD (chronic kidney  disease), stage III ([place]), Complication of anesthesia, Diabetes mellitus without complication ([place]), GERD (gastroesophageal reflux disease), Hypertension, Obesity, Obesity hypoventilation syndrome ([place]), Seasonal allergies, Shortness of breath, and Sleep apnea.  PAST SURGICAL HISTORY: She  has a past surgical history that includes Tonsillectomy; Cholecystectomy;  fybroid tumors; Breast surgery; Eye surgery; Hernia repair; Pilonidal cyst excision; and Partial mastectomy with needle localization (Left, 04/05/2013).  Allergies  Allergen Reactions  . Demerol [Meperidine] Anaphylaxis    Reaction: Hypoventilation after surgery  . Penicillins Anaphylaxis and Swelling    Has patient had a PCN reaction causing immediate rash, facial/tongue/throat swelling, SOB or lightheadedness with hypotension: Yes Has patient had a PCN reaction causing severe rash involving mucus membranes or skin necrosis: No Has patient had a PCN reaction that required hospitalization: Yes Has patient had a PCN reaction occurring within the last 10 years: No If all of the above answers are "NO", then may proceed with Cephalosporin use.   Marland Kitchen Phenergan [Promethazine Hcl] Anaphylaxis    Reaction: hypoventilation  . Ativan [Lorazepam] Other (See Comments)    Stopped breathing  . Codeine Nausea And Vomiting  . Macrobid [Nitrofurantoin] Nausea And Vomiting  . Relafen [Nabumetone] Itching and Swelling  . Sulfa Antibiotics Other (See Comments)    Unknown allergic reaction per sister    No current facility-administered medications on file prior to encounter.    Current Outpatient Medications on File Prior to Encounter  Medication Sig  . acetaminophen (TYLENOL) 500 MG tablet Take 500 mg by mouth every 6 (six) hours as needed for mild pain.   Marland Kitchen albuterol (PROVENTIL HFA;VENTOLIN HFA) 108 (90 Base)  MCG/ACT inhaler Inhale 2 puffs into the lungs every 6 (six) hours as needed for wheezing or shortness of breath.  Marland Kitchen aspirin EC 81 MG tablet  Take 81 mg by mouth daily.   Marland Kitchen atorvastatin (LIPITOR) 10 MG tablet Take 10 mg by mouth at bedtime. Reported on 05/14/2015  . azelastine (ASTELIN) 0.1 % nasal spray Place 2 sprays into both nostrils 2 (two) times daily. Use in each nostril as directed  . cetirizine (ZYRTEC) 10 MG tablet Take 10 mg by mouth daily. Reported on 05/14/2015  . cholecalciferol (VITAMIN D) 1000 UNITS tablet Take 1,000 Units by mouth daily. Reported on 05/14/2015  . CINNAMON PO Take 1 capsule by mouth at bedtime.  . diclofenac sodium (VOLTAREN) 1 % GEL Apply 4 g topically 2 (two) times daily as needed (for joint pain).   . Dietary Management Product (TOZAL PO) Take 1-2 tablets by mouth See admin instructions. Take 2 tablets by mouth every morning and 1 tablet at night (eye vitamin)  . fluticasone (FLONASE) 50 MCG/ACT nasal spray Place 2 sprays into the nose at bedtime.   Marland Kitchen guaiFENesin (MUCINEX) 600 MG 12 hr tablet Take 1 tablet (600 mg total) by mouth 2 (two) times daily. (Patient taking differently: Take 1,200 mg by mouth daily. )  . HUMALOG 100 UNIT/ML injection Inject 2-10 Units into the skin 2 (two) times daily before lunch and supper. Per sliding scale: 150-200 2 units, 201-250, 4 units, 251-300 8 units, 301-350 10 units, >350 call doctor  . hydroxypropyl methylcellulose (ISOPTO TEARS) 2.5 % ophthalmic solution Place 1 drop into both eyes 3 (three) times daily as needed (for dry eyes).  Marland Kitchen ipratropium-albuterol (DUONEB) 0.5-2.5 (3) MG/3ML SOLN Take 3 mLs by nebulization 3 (three) times daily. (Patient taking differently: Take 3 mLs by nebulization 3 (three) times daily as needed (shortness of breath/wheezing). )  . LANTUS 100 UNIT/ML injection Inject 10 Units into the skin at bedtime.  . magnesium hydroxide (MILK OF MAGNESIA) 400 MG/5ML suspension Take 15 mLs by mouth daily as needed for mild constipation.  . nebivolol (BYSTOLIC) 5 MG tablet Take 5 mg by mouth daily.  . Omega-3 Fatty Acids (OMEGA 3 PO) Take 1 capsule by  mouth daily.   . pantoprazole (PROTONIX) 40 MG tablet Take 40 mg by mouth daily.  Marland Kitchen senna (SENOKOT) 8.6 MG TABS tablet Take 2 tablets by mouth at bedtime.   . sertraline (ZOLOFT) 50 MG tablet Take 100 mg by mouth daily.  . sodium chloride (OCEAN) 0.65 % SOLN nasal spray Place 1 spray into both nostrils as needed for congestion.  . torsemide (DEMADEX) 20 MG tablet Take 3 tablets (60 mg total) by mouth daily. (Patient taking differently: Take 40 mg by mouth daily. )  . traMADol (ULTRAM) 50 MG tablet Take 1 tablet (50 mg total) by mouth every 12 (twelve) hours as needed (for pain.). (Patient taking differently: Take 50 mg by mouth every 12 (twelve) hours as needed for moderate pain. )    FAMILY HISTORY:  Her indicated that her mother is deceased. She indicated that her father is deceased. She indicated that the status of her sister is unknown.   SOCIAL HISTORY: She  reports that she quit smoking about 37 years ago. Her smoking use included cigarettes. She has a 20.00 pack-year smoking history. she has never used smokeless tobacco. She reports that she does not drink alcohol or use drugs.  REVIEW OF SYSTEMS:   Unable to obtain.  SUBJECTIVE:  Unable to obtain.  VITAL SIGNS: BP (!) 115/45   Pulse 66   Temp 98.3 F (36.8 C) (Axillary)   Resp 18   Wt 115 kg (253 lb 8.5 oz)   SpO2 98%   BMI 43.52 kg/m   HEMODYNAMICS:    VENTILATOR SETTINGS:    INTAKE / OUTPUT: No intake/output data recorded.  PHYSICAL EXAMINATION: General:  Unresponsive. Withdraws to tactile stimulation. Neuro:  Unresponsive. HEENT:  Pupils small, sluggishly reactive to light. No icterus. No stridor. Cardiovascular:  Distant s1s2, regular rhythm. No murmur, gallop or rub. Lungs:  Diminished breath sounds throughout. No wheezes, crackles or rhonchi.  Abdomen:  Morbidly obese. No elicited tenderness. Musculoskeletal:  No cyanosis Skin:  Warm to touch. +pedal edema.  LABS:  BMET Recent Labs  Lab  07/07/17 1322  NA 144  K 4.9  CL 90*  CO2 41*  BUN 51*  CREATININE 0.93  GLUCOSE 154*    Electrolytes Recent Labs  Lab 07/07/17 1322  CALCIUM 10.0    CBC Recent Labs  Lab 07/07/17 1322  WBC 10.8*  HGB 12.5  HCT 42.3  PLT 199    Coag's No results for input(s): APTT, INR in the last 168 hours.  Sepsis Markers No results for input(s): LATICACIDVEN, PROCALCITON, O2SATVEN in the last 168 hours.  ABG Recent Labs  Lab 07/07/17 1350 07/07/17 1630  PHART 7.253* 7.198*  PCO2ART 112* PENDING  PO2ART 133* PENDING    Liver Enzymes No results for input(s): AST, ALT, ALKPHOS, BILITOT, ALBUMIN in the last 168 hours.  Cardiac Enzymes No results for input(s): TROPONINI, PROBNP in the last 168 hours.  Glucose Recent Labs  Lab 07/07/17 1700  GLUCAP 123*    Imaging Dg Chest 1 View  Result Date: 07/07/2017 CLINICAL DATA:  SOB. Hx of CHF, diabetes, hypertension, sleep apnea, obesity hypoventilation syndrome. Former [phone]). EXAM: CHEST 1 VIEW COMPARISON:  Chest x-rays dated 06/14/2017, 03/18/2017 and 12/20/2016. FINDINGS: Mild cardiomegaly is stable. Overall cardiomediastinal silhouette is stable in size and configuration. Coarse interstitial markings noted bilaterally suggesting interstitial edema. More confluent opacity noted at the lateral aspects of the right lower lung most likely pneumonia or confluent pulmonary edema. Osseous structures about the chest are unremarkable. IMPRESSION: 1. Bilateral interstitial prominence, most likely interstitial edema related to volume overload/CHF. 2. Confluent opacity at the lateral aspects of the right lower lung, pneumonia versus confluent pulmonary edema. Recommend follow-up chest x-rays to ensure resolution. Electronically Signed   By: Franki Cabot M.D.   On: 07/07/2017 13:17   Ct Head Wo Contrast  Result Date: 07/07/2017 CLINICAL DATA:  Altered level of consciousness. EXAM: CT HEAD WITHOUT CONTRAST TECHNIQUE: Contiguous axial  images were obtained from the base of the skull through the vertex without intravenous contrast. COMPARISON:  None. FINDINGS: Brain: There is no evidence of acute infarct, intracranial hemorrhage, mass, midline shift, or extra-axial fluid collection. Mild cerebral atrophy is most notable in the parietal regions. Patchy deep cerebral white matter hypodensities are nonspecific but compatible with mild chronic small vessel ischemic disease. Vascular: Calcified atherosclerosis at the skull base. No hyperdense vessel. Skull: No fracture or focal osseous lesion. Sinuses/Orbits: Paranasal sinuses and mastoid air cells are clear. Bilateral cataract extraction. Other: None. IMPRESSION: 1. No evidence of acute intracranial abnormality. 2. Mild chronic small vessel ischemic disease. Electronically Signed   By: Logan Bores M.D.   On: 07/07/2017 14:51     DISCUSSION: 84 y.o. Female with acute on chronic hypercapnic respiratory failure.  ASSESSMENT / PLAN:  PULMONARY A: 1. Respiratory  failure P:   1. Proceed with intubation 2. ACVC ventilation 3. F/u abg's 4. cxr after intubation.  CARDIOVASCULAR A:  1. Possible pulmonary edema vs pulmonary HTN P:  1. Cardiac monitoring in the icu 2. Echocardiogram.  RENAL A:   1. Azotemia likely due to diuretic use at home. P:   1. No further diuresis 2. IV fluids 3. Monitor bmp  GASTROINTESTINAL A:   None P:   1. Pepcid for gi prophylaxis  HEMATOLOGIC A:   None P:  Monitor the cbc intermittently.  INFECTIOUS A:   1. Possible pneumonia P:   1. Sputum culture 2. Empiric antibiotic therapy pending culture results 3. Respiratory pcr  ENDOCRINE A:   1. DM  P:   1. Blood glucose monitoring. Possible glycemic control required with s/s insulin.  NEUROLOGIC A:   Probable metabolic encephalopathy. P:   RASS goal: -1 to -2    FAMILY  - Updates: not available.   Jesus Genera, MD Pulmonary and [place] Pager: [phone]  07/07/2017, 5:42 PM

## 2017-07-07 NOTE — ED Triage Notes (Signed)
Patient presents to ed via [facility] states patient has been acting her normal self since Tues. Patient has 24 hours care, caregiver states patient is bedridden. Suppose to be on bipap however has been pulling herself off. Upon ems arrival patient's sats were in the 70's , on 4L [place]. Caretaker states she was at the pulm. Doctor yest.

## 2017-07-07 NOTE — Progress Notes (Signed)
Warren Lacy MD approved OGT for use.

## 2017-07-07 NOTE — ED Notes (Signed)
Dr. Tamera Punt aware patient's b/p is lower. Orders given for 250cc bolus NS

## 2017-07-07 NOTE — Sedation Documentation (Signed)
1st intubation unsuccessful

## 2017-07-07 NOTE — Procedures (Signed)
Intubation Procedure Note Nelle Sayed [id number] 01-26-1932  Procedure: Intubation Indications: Respiratory insufficiency  Procedure Details Consent: Unable to obtain consent because of altered level of consciousness. Time Out: Verified patient identification, verified procedure, site/side was marked, verified correct patient position, special equipment/implants available, medications/allergies/relevent history reviewed, required imaging and test results available.  Performed  Maximum sterile technique was used including gloves and mask.  Kimla Furth and 3    Evaluation Hemodynamic Status: BP stable throughout; O2 sats: stable throughout Patient's Current Condition: stable Complications: No apparent complications Patient did tolerate procedure well. Chest X-ray ordered to verify placement.  CXR: tube position acceptable.   Jesus Genera, MD 07/07/2017

## 2017-07-07 NOTE — ED Provider Notes (Addendum)
[place] [facility] EMERGENCY DEPARTMENT Provider Note   CSN: [id number] Arrival date & time: 07/07/17  1215     History   Chief Complaint Chief Complaint  Patient presents with  . Shortness of Breath  . Altered Mental Status    HPI Makayla Rodriguez is a 84 y.o. female.  84 y.o. female with medical history significant for OHS, OSA, chronic hypercarbic and hypoxic respiratory failure on supplemental oxygen at 4LPM during the day and nocturnal BiPAP, chronic diastolic CHF, insulin-dependent diabetes mellitus, lung mass followed by pulmonology and suspected to be benign, and depression, now presenting to the emergency department for evaluation of somnolence and dyspnea.  Her caretaker states that she has not been compliant with her BiPAP at night.  She does not like to wear it so they will put it on intermittently through the day.  They noted that her oxygen saturations were low in the 80s on the BiPAP this morning and she was less arousable than normal.  She states that she has been having this kind of problem intermittently since Tuesday.  She is also had some confusion and what she describes as difficulty with her speech intermittently since Tuesday.  She is bedridden.  She does not get up to a chair.  No history of falls recently.  No fevers.  No vomiting.      Past Medical History:  Diagnosis Date  . Anxiety   . Arthritis   . CHF (congestive heart failure) ([place])    12/13 admission  . CKD (chronic kidney disease), stage III ([place])   . Complication of anesthesia    O2 sat drop during  and went into coma  . Diabetes mellitus without complication ([place])   . GERD (gastroesophageal reflux disease)   . Hypertension   . Obesity   . Obesity hypoventilation syndrome ([place])    uses O2 at home  . Seasonal allergies   . Shortness of breath    with annxiety attack  . Sleep apnea    CPAP    Patient Active Problem List   Diagnosis Date Noted  . HCAP (healthcare-associated  pneumonia) 07/07/2017  . Acute and chronic respiratory failure with hypercapnia ([place]) 06/02/2017  . Chronic diastolic HF (heart failure) ([place])   . Elevated troponin 03/18/2017  . Depression 03/18/2017  . HLD (hyperlipidemia) 03/18/2017  . CKD (chronic kidney disease), stage III ([place])   . DNR (do not resuscitate) discussion   . Palliative care by specialist   . Acute exacerbation of CHF (congestive heart failure) ([place]) 12/19/2016  . Type II diabetes mellitus with renal manifestations ([place])   . Hyperlipidemia   . Morbid obesity with body mass index of 45.0-49.9 in adult [facility]) 09/20/2014  . Acute on chronic respiratory failure with hypercapnia ([place])   . Dyspnea 09/28/2013  . Allergic rhinitis 09/11/2013  . Cancer of upper-outer quadrant of female breast ([place]) 03/07/2013  . OSA (obstructive sleep apnea) 08/09/2012  . Obesity hypoventilation syndrome ([place]) 08/09/2012  . Pleural mass 05/15/2012  . Acute on chronic diastolic CHF (congestive heart failure) ([place]) 05/11/2012  . Diabetes mellitus ([place]) 05/11/2012  . OA (osteoarthritis) of ankle 05/11/2012  . Essential hypertension 05/11/2012  . Morbid obesity ([place]) 05/11/2012    Past Surgical History:  Procedure Laterality Date  .  fybroid tumors    . BREAST SURGERY    . CHOLECYSTECTOMY    . EYE SURGERY     cataracts  . HERNIA REPAIR     umbilical  . PARTIAL  MASTECTOMY WITH NEEDLE LOCALIZATION Left 04/05/2013   Procedure: PARTIAL MASTECTOMY WITH NEEDLE LOCALIZATION;  Surgeon: Adin Hector, MD;  Location: [facility];  Service: General;  Laterality: Left;  . PILONIDAL CYST EXCISION     buttock  . TONSILLECTOMY      OB History    No data available       Home Medications    Prior to Admission medications   Medication Sig Start Date End Date Taking? Authorizing Provider  acetaminophen (TYLENOL) 500 MG tablet Take 500 mg by mouth every 6 (six) hours as needed for mild pain.     [provider]  albuterol (PROVENTIL  HFA;VENTOLIN HFA) 108 (90 Base) MCG/ACT inhaler Inhale 2 puffs into the lungs every 6 (six) hours as needed for wheezing or shortness of breath.    [provider]  aspirin EC 81 MG tablet Take 81 mg by mouth every morning.    [provider]  atorvastatin (LIPITOR) 10 MG tablet Take 10 mg by mouth at bedtime. Reported on 05/14/2015 07/22/14   [provider]  azelastine (ASTELIN) 0.1 % nasal spray Place 2 sprays into both nostrils 2 (two) times daily. Use in each nostril as directed 04/16/14   Donita Brooks, NP  cetirizine (ZYRTEC) 10 MG tablet Take 10 mg by mouth daily. Reported on 05/14/2015    [provider]  cholecalciferol (VITAMIN D) 1000 UNITS tablet Take 1,000 Units by mouth every morning. Reported on 05/14/2015    [provider]  CINNAMON PO Take 1 capsule by mouth at bedtime.    [provider]  diclofenac sodium (VOLTAREN) 1 % GEL Apply 4 g topically 2 (two) times daily as needed (for joint pain).     [provider]  Dietary Management Product (TOZAL PO) Take 1-2 tablets by mouth 2 (two) times daily. 2 in the morning 1 in the evening    [provider]  fluticasone (FLONASE) 50 MCG/ACT nasal spray Place 2 sprays into the nose at bedtime.     [provider]  guaiFENesin (MUCINEX) 600 MG 12 hr tablet Take 1 tablet (600 mg total) by mouth 2 (two) times daily. Patient taking differently: Take 1,200 mg by mouth 2 (two) times daily.  06/06/17 06/06/18  Georgette Shell, MD  HUMALOG 100 UNIT/ML injection Inject 2-10 Units into the skin 2 (two) times daily before lunch and supper. Per sliding scale: 150-200 2 units, 201-250, 4 units, 251-300 8 units, 301-350 10 units, >350 call doctor 01/19/17   [provider]  hydroxypropyl methylcellulose (ISOPTO TEARS) 2.5 % ophthalmic solution Place 1 drop into both eyes 3 (three) times daily as needed (for dry eyes).    [provider]  ipratropium-albuterol  (DUONEB) 0.5-2.5 (3) MG/3ML SOLN Take 3 mLs by nebulization 3 (three) times daily. Patient taking differently: Take 3 mLs by nebulization 3 (three) times daily as needed (breathing).  03/20/17   Barton Dubois, MD  LANTUS 100 UNIT/ML injection Inject 10 Units into the skin at bedtime. 01/19/17   [provider]  magnesium hydroxide (MILK OF MAGNESIA) 400 MG/5ML suspension Take 15 mLs by mouth daily as needed for mild constipation.    [provider]  nebivolol (BYSTOLIC) 5 MG tablet Take 5 mg by mouth daily. 05/20/12   Barton Dubois, MD  Omega-3 Fatty Acids (OMEGA 3 PO) Take 1 capsule by mouth every morning.    [provider]  pantoprazole (PROTONIX) 40 MG tablet Take 40 mg by mouth daily.  [provider]  senna (SENOKOT) 8.6 MG TABS tablet Take 1 tablet by mouth at bedtime.    [provider]  sertraline (ZOLOFT) 50 MG tablet Take 100 mg by mouth daily.    [provider]  sodium chloride (OCEAN) 0.65 % SOLN nasal spray Place 1 spray into both nostrils as needed for congestion. 04/16/14   Donita Brooks, NP  torsemide (DEMADEX) 20 MG tablet Take 3 tablets (60 mg total) by mouth daily. Patient taking differently: Take 40 mg by mouth daily.  12/26/16   Florencia Reasons, MD  traMADol (ULTRAM) 50 MG tablet Take 1 tablet (50 mg total) by mouth every 12 (twelve) hours as needed (for pain.). Patient taking differently: Take 50 mg by mouth every 12 (twelve) hours as needed for moderate pain.  03/20/17   Barton Dubois, MD    Family History Family History  Problem Relation Age of Onset  . Heart failure Mother   . Gallbladder disease Father   . Pancreatic cancer Sister     Social History Social History   Tobacco Use  . Smoking status: Former Smoker    Packs/day: 0.50    Years: 40.00    Pack years: 20.00    Types: Cigarettes    Last attempt to quit: 05/30/1980    Years since quitting: 37.1  . Smokeless tobacco: Never Used  Substance Use Topics   . Alcohol use: No  . Drug use: No     Allergies   Demerol [meperidine]; Penicillins; Phenergan [promethazine hcl]; Ativan [lorazepam]; Codeine; Macrobid [nitrofurantoin]; and Relafen [nabumetone]   Review of Systems Review of Systems  Unable to perform ROS: Mental status change     Physical Exam Updated Vital Signs BP (!) 111/37   Pulse 70   Temp 98.3 F (36.8 C) (Axillary)   Resp (!) 24   SpO2 (!) 88%   Physical Exam  Constitutional: She is oriented to person, place, and time. She appears well-developed and well-nourished.  Morbidly obese  HENT:  Head: Normocephalic and atraumatic.  Eyes: Pupils are equal, round, and reactive to light.  Neck: Normal range of motion. Neck supple.  Cardiovascular: Normal rate and regular rhythm.  Murmur heard. Pulmonary/Chest: Effort normal. No respiratory distress. She has no wheezes. She has rhonchi. She has rales. She exhibits no tenderness.  Abdominal: Soft. Bowel sounds are normal. There is no tenderness. There is no rebound and no guarding.  Musculoskeletal: Normal range of motion.       Right lower leg: She exhibits edema.  Lymphadenopathy:    She has no cervical adenopathy.  Neurological: She is alert and oriented to person, place, and time.  Skin: Skin is warm and dry. No rash noted.  Psychiatric: She has a normal mood and affect.     ED Treatments / Results  Labs (all labs ordered are listed, but only abnormal results are displayed) Labs Reviewed  BASIC METABOLIC PANEL - Abnormal; Notable for the following components:      Result Value   Chloride 90 (*)    CO2 41 (*)    Glucose, Bld 154 (*)    BUN 51 (*)    GFR calc non Af Amer 55 (*)    All other components within normal limits  CBC WITH DIFFERENTIAL/PLATELET - Abnormal; Notable for the following components:   WBC 10.8 (*)    MCHC 29.6 (*)    Neutro Abs 8.8 (*)    All other components within normal limits  BRAIN NATRIURETIC PEPTIDE -  Abnormal; Notable for the  following components:   B Natriuretic Peptide 463.4 (*)    All other components within normal limits  BLOOD GAS, ARTERIAL - Abnormal; Notable for the following components:   pH, Arterial 7.253 (*)    pCO2 arterial 112 (*)    pO2, Arterial 133 (*)    Bicarbonate 47.9 (*)    Acid-Base Excess 20.1 (*)    All other components within normal limits  CULTURE, BLOOD (ROUTINE X 2)  CULTURE, BLOOD (ROUTINE X 2)  I-STAT TROPONIN, ED    EKG  EKG Interpretation  Date/Time:  Friday July 07 2017 12:54:24 EST Ventricular Rate:  88 PR Interval:    QRS Duration: 93 QT Interval:  423 QTC Calculation: 512 R Axis:   7 Text Interpretation:  Atrial fibrillation Prolonged QT interval likely sinus since last tracing no significant change Confirmed by Malvin Johns [phone]) on 07/07/2017 12:57:55 PM       Radiology Dg Chest 1 View  Result Date: 07/07/2017 CLINICAL DATA:  SOB. Hx of CHF, diabetes, hypertension, sleep apnea, obesity hypoventilation syndrome. Former [phone]). EXAM: CHEST 1 VIEW COMPARISON:  Chest x-rays dated 06/14/2017, 03/18/2017 and 12/20/2016. FINDINGS: Mild cardiomegaly is stable. Overall cardiomediastinal silhouette is stable in size and configuration. Coarse interstitial markings noted bilaterally suggesting interstitial edema. More confluent opacity noted at the lateral aspects of the right lower lung most likely pneumonia or confluent pulmonary edema. Osseous structures about the chest are unremarkable. IMPRESSION: 1. Bilateral interstitial prominence, most likely interstitial edema related to volume overload/CHF. 2. Confluent opacity at the lateral aspects of the right lower lung, pneumonia versus confluent pulmonary edema. Recommend follow-up chest x-rays to ensure resolution. Electronically Signed   By: Franki Cabot M.D.   On: 07/07/2017 13:17   Ct Head Wo Contrast  Result Date: 07/07/2017 CLINICAL DATA:  Altered level of consciousness. EXAM: CT HEAD WITHOUT CONTRAST  TECHNIQUE: Contiguous axial images were obtained from the base of the skull through the vertex without intravenous contrast. COMPARISON:  None. FINDINGS: Brain: There is no evidence of acute infarct, intracranial hemorrhage, mass, midline shift, or extra-axial fluid collection. Mild cerebral atrophy is most notable in the parietal regions. Patchy deep cerebral white matter hypodensities are nonspecific but compatible with mild chronic small vessel ischemic disease. Vascular: Calcified atherosclerosis at the skull base. No hyperdense vessel. Skull: No fracture or focal osseous lesion. Sinuses/Orbits: Paranasal sinuses and mastoid air cells are clear. Bilateral cataract extraction. Other: None. IMPRESSION: 1. No evidence of acute intracranial abnormality. 2. Mild chronic small vessel ischemic disease. Electronically Signed   By: Logan Bores M.D.   On: 07/07/2017 14:51    Procedures Procedures (including critical care time)  Medications Ordered in ED Medications  vancomycin (VANCOCIN) 2,000 mg in sodium chloride 0.9 % 500 mL IVPB (not administered)  ceFEPIme (MAXIPIME) 2 g in dextrose 5 % 50 mL IVPB (not administered)  vancomycin (VANCOCIN) 1,250 mg in sodium chloride 0.9 % 250 mL IVPB (not administered)  ceFEPIme (MAXIPIME) 2 g in dextrose 5 % 50 mL IVPB (not administered)  furosemide (LASIX) injection 80 mg (80 mg Intravenous Given 07/07/17 1451)     Initial Impression / Assessment and Plan / ED Course  I have reviewed the triage vital signs and the nursing notes.  Pertinent labs & imaging results that were available during my care of the patient were reviewed by me and considered in my medical decision making (see chart for details).     Patient is a 84 year old female who  has chronic issues with BiPAP noncompliance and hypoventilation syndrome.  She presents with increased confusion.  She has no fever or other suggestions of sepsis.  Her white count is mildly elevated.  Her chest x-ray shows  some possible pneumonia and pulmonary edema.  She was started on antibiotics for healthcare associated pneumonia and also given Lasix.  An ABG was performed which shows marked elevation in her PCO2.  She was started on BiPAP.  I spoke with the hospitalist who will admit the patient for further evaluation.  Her other labs are similar to baseline values.  CRITICAL CARE Performed by: Malvin Johns Total critical care time: 45 minutes Critical care time was exclusive of separately billable procedures and treating other patients. Critical care was necessary to treat or prevent imminent or life-threatening deterioration. Critical care was time spent personally by me on the following activities: development of treatment plan with patient and/or surrogate as well as nursing, discussions with consultants, evaluation of patient's response to treatment, examination of patient, obtaining history from patient or surrogate, ordering and performing treatments and interventions, ordering and review of laboratory studies, ordering and review of radiographic studies, pulse oximetry and re-evaluation of patient's condition.   Final Clinical Impressions(s) / ED Diagnoses   Final diagnoses:  Hypercapnia  Confusion  HCAP (healthcare-associated pneumonia)  Acute pulmonary edema [facility])    ED Discharge Orders    None       Malvin Johns, MD 07/07/17 1457    Malvin Johns, MD 07/18/17 [phone]

## 2017-07-07 NOTE — Progress Notes (Addendum)
Pharmacy Antibiotic Note  Makayla Rodriguez is a 84 y.o. female admitted on 07/07/2017 with pneumonia.  Pharmacy has been consulted for vancomycin and cefepime dosing. WBC mildly elevated at 10.8. SCr 0.93. nCrCl ~ 50-55 mL/min. Patient has a dose of IV cefepime ordered.   Plan: -Vancomycin 2 gm IV once, then vancomycin 1250 mg IV Q 24 hours x 8 days -Cefepime 2 gm IV Q 8 hours  -Monitor CBC, renal fx, cultures and clinical progress -[facility]      No data recorded.  Recent Labs  Lab 07/07/17 1322  WBC 10.8*    CrCl cannot be calculated (Patient's most recent lab result is older than the maximum 21 days allowed.).    Allergies  Allergen Reactions  . Demerol [Meperidine] Anaphylaxis    Reaction: Hypoventilation after surgery  . Penicillins Anaphylaxis and Swelling    Has patient had a PCN reaction causing immediate rash, facial/tongue/throat swelling, SOB or lightheadedness with hypotension: Yes Has patient had a PCN reaction causing severe rash involving mucus membranes or skin necrosis: No Has patient had a PCN reaction that required hospitalization: Yes Has patient had a PCN reaction occurring within the last 10 years: No If all of the above answers are "NO", then may proceed with Cephalosporin use.   Marland Kitchen Phenergan [Promethazine Hcl] Anaphylaxis    Reaction: hypoventilation  . Ativan [Lorazepam]     Stopped breathing  . Codeine Nausea And Vomiting  . Macrobid [Nitrofurantoin] Nausea And Vomiting  . Relafen [Nabumetone] Itching and Swelling    Antimicrobials this admission: Vanc 2/8>> Cefepime 2/8>  Dose adjustments this admission:   Microbiology results:  Thank you for allowing pharmacy to be a part of this patient's care.  Albertina Parr, PharmD., BCPS Clinical Pharmacist Pager [phone]

## 2017-07-07 NOTE — ED Notes (Signed)
Delay in transfer to the floor due to waiting on XR for OG placement.

## 2017-07-07 NOTE — H&P (Signed)
History and Physical    Makayla Rodriguez DGL:[id number] DOB: Mar 22, 1932 DOA: 07/07/2017  PCP: Darcus Austin, MD   Consultants:  Pulmonology  Patient coming from: Home  Chief Complaint: respiratory failure  HPI: Makayla Rodriguez is a 84 y.o. female with medical history significant of chronic respiratory failure driven by obesity and requiring use of BiPAP, CHF, CKD, OSA,  hypertension, GERD, diabetes mellitus who presented to the emergency department with confusion.  She has a history of chronic hyperbaric and hypoxic respiratory failure, is supposed to use supplementa oxygen during the day and stay on nocturnal BiPAP. However she is not compliant with this regimen.  She complains of oxygen leak from BiPAP and the home health nurses were trying to adjust the mask.  She was seen for the same issue yesterday at the [facility] pulmonology and and they request for [place] home visit was placed to assess patient's complain of leaking mask.  Patient is usually nonambulatory and in the morning she has some degree of confusion.  However today seems more confused   ED Course: On arrival patient was tachypneic with oxygen saturations 70-80%.  Her ABG showed hypoxia with pH 7.253 and PCO2 of 112 and pO2 133 on NR. Blood work demonstrated leukocytosis with white blood cell count 10.8 Chest x-ray stated bilateral interstitial edema and confluent opacity at the lateral aspects of the right lower lobe suspicious for pneumonia versus confluent pulmonary edema Patient was placed on BiPAP, given 80 mg of Lasix IV and started on IV vancomycin and cefepime.   Review of Systems: As per HPI; otherwise review of systems reviewed and negative.   Ambulatory Status: bedridden  Past Medical History:  Diagnosis Date  . Anxiety   . Arthritis   . CHF (congestive heart failure) ([place])    12/13 admission  . CKD (chronic kidney disease), stage III ([place])   . Complication of anesthesia    O2 sat drop during  and went into coma   . Diabetes mellitus without complication ([place])   . GERD (gastroesophageal reflux disease)   . Hypertension   . Obesity   . Obesity hypoventilation syndrome ([place])    uses O2 at home  . Seasonal allergies   . Shortness of breath    with annxiety attack  . Sleep apnea    CPAP    Past Surgical History:  Procedure Laterality Date  .  fybroid tumors    . BREAST SURGERY    . CHOLECYSTECTOMY    . EYE SURGERY     cataracts  . HERNIA REPAIR     umbilical  . PARTIAL MASTECTOMY WITH NEEDLE LOCALIZATION Left 04/05/2013   Procedure: PARTIAL MASTECTOMY WITH NEEDLE LOCALIZATION;  Surgeon: Adin Hector, MD;  Location: [place];  Service: General;  Laterality: Left;  . PILONIDAL CYST EXCISION     buttock  . TONSILLECTOMY      Social History   Socioeconomic History  . Marital status: Single    Spouse name: Not on file  . Number of children: 0  . Years of education: Not on file  . Highest education level: Not on file  Social Needs  . Financial resource strain: Not on file  . Food insecurity - worry: Not on file  . Food insecurity - inability: Not on file  . Transportation needs - medical: Not on file  . Transportation needs - non-medical: Not on file  Occupational History  . 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 Smoking  status: Former Smoker    Packs/day: 0.50    Years: 40.00    Pack years: 20.00    Types: Cigarettes    Last attempt to quit: 05/30/1980    Years since quitting: 37.1  . Smokeless tobacco: Never Used  Substance and Sexual Activity  . Alcohol use: No  . Drug use: No  . Sexual activity: No  Other Topics Concern  . Not on file  Social History Narrative   Never married   No children    Allergies  Allergen Reactions  . Demerol [Meperidine] Anaphylaxis    Reaction: Hypoventilation after surgery  . Penicillins Anaphylaxis and Swelling    Has patient had a PCN reaction causing immediate rash, facial/tongue/throat swelling,  SOB or lightheadedness with hypotension: Yes Has patient had a PCN reaction causing severe rash involving mucus membranes or skin necrosis: No Has patient had a PCN reaction that required hospitalization: Yes Has patient had a PCN reaction occurring within the last 10 years: No If all of the above answers are "NO", then may proceed with Cephalosporin use.   Marland Kitchen Phenergan [Promethazine Hcl] Anaphylaxis    Reaction: hypoventilation  . Ativan [Lorazepam]     Stopped breathing  . Codeine Nausea And Vomiting  . Macrobid [Nitrofurantoin] Nausea And Vomiting  . Relafen [Nabumetone] Itching and Swelling    Family History  Problem Relation Age of Onset  . Heart failure Mother   . Gallbladder disease Father   . Pancreatic cancer Sister     Prior to Admission medications   Medication Sig Start Date End Date Taking? Authorizing Provider  acetaminophen (TYLENOL) 500 MG tablet Take 500 mg by mouth every 6 (six) hours as needed for mild pain.     [provider]  albuterol (PROVENTIL HFA;VENTOLIN HFA) 108 (90 Base) MCG/ACT inhaler Inhale 2 puffs into the lungs every 6 (six) hours as needed for wheezing or shortness of breath.    [provider]  aspirin EC 81 MG tablet Take 81 mg by mouth every morning.    [provider]  atorvastatin (LIPITOR) 10 MG tablet Take 10 mg by mouth at bedtime. Reported on 05/14/2015 07/22/14   [provider]  azelastine (ASTELIN) 0.1 % nasal spray Place 2 sprays into both nostrils 2 (two) times daily. Use in each nostril as directed 04/16/14   Donita Brooks, NP  cetirizine (ZYRTEC) 10 MG tablet Take 10 mg by mouth daily. Reported on 05/14/2015    [provider]  cholecalciferol (VITAMIN D) 1000 UNITS tablet Take 1,000 Units by mouth every morning. Reported on 05/14/2015    [provider]  CINNAMON PO Take 1 capsule by mouth at bedtime.    [provider]  diclofenac sodium (VOLTAREN) 1 % GEL Apply 4 g  topically 2 (two) times daily as needed (for joint pain).     [provider]  Dietary Management Product (TOZAL PO) Take 1-2 tablets by mouth 2 (two) times daily. 2 in the morning 1 in the evening    [provider]  fluticasone (FLONASE) 50 MCG/ACT nasal spray Place 2 sprays into the nose at bedtime.     [provider]  guaiFENesin (MUCINEX) 600 MG 12 hr tablet Take 1 tablet (600 mg total) by mouth 2 (two) times daily. Patient taking differently: Take 1,200 mg by mouth 2 (two) times daily.  06/06/17 06/06/18  Georgette Shell, MD  HUMALOG 100 UNIT/ML injection Inject 2-10 Units into the skin 2 (two) times daily  before lunch and supper. Per sliding scale: 150-200 2 units, 201-250, 4 units, 251-300 8 units, 301-350 10 units, >350 call doctor 01/19/17   [provider]  hydroxypropyl methylcellulose (ISOPTO TEARS) 2.5 % ophthalmic solution Place 1 drop into both eyes 3 (three) times daily as needed (for dry eyes).    [provider]  ipratropium-albuterol (DUONEB) 0.5-2.5 (3) MG/3ML SOLN Take 3 mLs by nebulization 3 (three) times daily. Patient taking differently: Take 3 mLs by nebulization 3 (three) times daily as needed (breathing).  03/20/17   Barton Dubois, MD  LANTUS 100 UNIT/ML injection Inject 10 Units into the skin at bedtime. 01/19/17   [provider]  magnesium hydroxide (MILK OF MAGNESIA) 400 MG/5ML suspension Take 15 mLs by mouth daily as needed for mild constipation.    [provider]  nebivolol (BYSTOLIC) 5 MG tablet Take 5 mg by mouth daily. 05/20/12   Barton Dubois, MD  Omega-3 Fatty Acids (OMEGA 3 PO) Take 1 capsule by mouth every morning.    [provider]  pantoprazole (PROTONIX) 40 MG tablet Take 40 mg by mouth daily.    [provider]  senna (SENOKOT) 8.6 MG TABS tablet Take 1 tablet by mouth at bedtime.    [provider]  sertraline (ZOLOFT) 50 MG tablet Take 100 mg by mouth daily.     [provider]  sodium chloride (OCEAN) 0.65 % SOLN nasal spray Place 1 spray into both nostrils as needed for congestion. 04/16/14   Donita Brooks, NP  torsemide (DEMADEX) 20 MG tablet Take 3 tablets (60 mg total) by mouth daily. Patient taking differently: Take 40 mg by mouth daily.  12/26/16   Florencia Reasons, MD  traMADol (ULTRAM) 50 MG tablet Take 1 tablet (50 mg total) by mouth every 12 (twelve) hours as needed (for pain.). Patient taking differently: Take 50 mg by mouth every 12 (twelve) hours as needed for moderate pain.  03/20/17   Barton Dubois, MD    Physical Exam: Vitals:   07/07/17 1405 07/07/17 1415 07/07/17 1430 07/07/17 1445  BP:  (!) 95/38 (!) 87/33 (!) 111/37  Pulse: 86 69 67 70  Resp: (!) 23   (!) 24  Temp:      TempSrc:      SpO2: 96% 95% 93% (!) 88%     General:  Severely obese, on BiPAP, lethargic, only opens her eyes when called by name Eyes: unable to assess ENT: lips dry, dentition is appropriate Neck: no LAD, masses or thyromegaly Cardiovascular: RRR, 3/6 systolic murmurno rubs, gallips. No significant LE edema. Telemetry: SR, no arrhythmias  Respiratory: coarse BS bilaterally, no wheezes, on BiPAP. Abdomen: soft, obese, unable to appreciate any masses or organomrgaly Skin: no rash or induration seen on limited exam Musculoskeletal: unable to assess fully, but no joint swelling or deformity appeciated Psychiatric: unable to assess Neurologic: patient is lethargic, opens eyes to shaking her shoulder, does not follow commands       Radiological Exams on Admission: Dg Chest 1 View  Result Date: 07/07/2017 CLINICAL DATA:  SOB. Hx of CHF, diabetes, hypertension, sleep apnea, obesity hypoventilation syndrome. Former [phone]). EXAM: CHEST 1 VIEW COMPARISON:  Chest x-rays dated 06/14/2017, 03/18/2017 and 12/20/2016. FINDINGS: Mild cardiomegaly is stable. Overall cardiomediastinal silhouette is stable in size and configuration. Coarse interstitial  markings noted bilaterally suggesting interstitial edema. More confluent opacity noted at the lateral aspects of the right lower lung most likely pneumonia or confluent pulmonary edema. Osseous structures about the chest  are unremarkable. IMPRESSION: 1. Bilateral interstitial prominence, most likely interstitial edema related to volume overload/CHF. 2. Confluent opacity at the lateral aspects of the right lower lung, pneumonia versus confluent pulmonary edema. Recommend follow-up chest x-rays to ensure resolution. Electronically Signed   By: Franki Cabot M.D.   On: 07/07/2017 13:17   Ct Head Wo Contrast  Result Date: 07/07/2017 CLINICAL DATA:  Altered level of consciousness. EXAM: CT HEAD WITHOUT CONTRAST TECHNIQUE: Contiguous axial images were obtained from the base of the skull through the vertex without intravenous contrast. COMPARISON:  None. FINDINGS: Brain: There is no evidence of acute infarct, intracranial hemorrhage, mass, midline shift, or extra-axial fluid collection. Mild cerebral atrophy is most notable in the parietal regions. Patchy deep cerebral white matter hypodensities are nonspecific but compatible with mild chronic small vessel ischemic disease. Vascular: Calcified atherosclerosis at the skull base. No hyperdense vessel. Skull: No fracture or focal osseous lesion. Sinuses/Orbits: Paranasal sinuses and mastoid air cells are clear. Bilateral cataract extraction. Other: None. IMPRESSION: 1. No evidence of acute intracranial abnormality. 2. Mild chronic small vessel ischemic disease. Electronically Signed   By: Logan Bores M.D.   On: 07/07/2017 14:51    EKG: Independently reviewed.Afib without acute ST-TW changes, prolomged QT interval Labs on Admission: I have personally reviewed the available labs and imaging studies at the time of the admission.  Pertinent labs: WBC 10,800, pH 7.253, pCO2 112, BUN 51, creat 0.93   Assessment/Plan Principal Problem:   Acute on chronic respiratory  failure with hypercapnia (HCC) Active Problems:   Essential hypertension   Morbid obesity (HCC)   OSA (obstructive sleep apnea)   Obesity hypoventilation syndrome (HCC)   Acute exacerbation of CHF (congestive heart failure) (HCC)   Type II diabetes mellitus with renal manifestations (HCC)   HLD (hyperlipidemia)   CKD (chronic kidney disease), stage III (HCC)   HCAP (healthcare-associated pneumonia)     Acute on chronic respiratory failure was hypercapnia -multifactorial most likely associated with obesity hypoventilation syndrome and superimposed CHF exacerbation and pneumonia Continue  BiPAP now and recheck ABG Duo nebs every 6 hours per he rhome regimen that patient has been noncompliant Currently is very lethargic. Will hold all oral home meds and diet until alert and able to follow commands   HCAP - Patient has abnormal XRay with mild Leukocytosis, and suspicious Xray. She was recently  hospitalized with HCAP. The patient is mildly septic with leukocytosis and fever.  Will to stepdown unit Continue IV Vancomycin and cefepime Continue duoneb and rescue albuterol Check S. pneumococcal antigen Albuterol Nebulizer prn for SOB and Mucinex for cough Follow up blood culture x2, sputum culture and respiratory virus panel, plus Flu pcr Zofran for Nausea   Acute CHF exacerbation Patient was given Lasix IV 80 mg and has no urinary out yet Maintain low salt diet, monitor daily weight and I/O  DM - insulin dependent Continue lantus Start SSI  Follow FSBS    DVT prophylaxis:  Lovenox or SCDs Code Status: Full - confirmed with patient/family Family Communication:  Disposition Plan: Home once clinically improved Consults called: none Admission status: inpatient   York Grice PA-C [phone] [facility]  If note is complete, please contact covering daytime or nighttime physician. [email or web address] Password [email or web address]  07/07/2017, 3:00 PM

## 2017-07-08 ENCOUNTER — Other Ambulatory Visit (HOSPITAL_COMMUNITY): Payer: Medicare Other

## 2017-07-08 DIAGNOSIS — J9622 Acute and chronic respiratory failure with hypercapnia: Principal | ICD-10-CM

## 2017-07-08 DIAGNOSIS — J189 Pneumonia, unspecified organism: Secondary | ICD-10-CM

## 2017-07-08 DIAGNOSIS — N183 Chronic kidney disease, stage 3 (moderate): Secondary | ICD-10-CM

## 2017-07-08 DIAGNOSIS — I5033 Acute on chronic diastolic (congestive) heart failure: Secondary | ICD-10-CM

## 2017-07-08 LAB — RESPIRATORY PANEL BY PCR

## 2017-07-08 LAB — CBC
HEMATOCRIT: 38.2 % (ref 36.0–46.0)
Hemoglobin: 11.5 g/dL — ABNORMAL LOW (ref 12.0–15.0)
MCH: 29.3 pg (ref 26.0–34.0)
MCHC: 30.1 g/dL (ref 30.0–36.0)
MCV: 97.4 fL (ref 78.0–100.0)
Platelets: 159 10*3/uL (ref 150–400)
RBC: 3.92 MIL/uL (ref 3.87–5.11)
RDW: 12.9 % (ref 11.5–15.5)
WBC: 10.8 10*3/uL — AB (ref 4.0–10.5)

## 2017-07-08 LAB — GLUCOSE, CAPILLARY
Glucose-Capillary: 119 mg/dL — ABNORMAL HIGH (ref 65–99)
Glucose-Capillary: 124 mg/dL — ABNORMAL HIGH (ref 65–99)
Glucose-Capillary: 127 mg/dL — ABNORMAL HIGH (ref 65–99)
Glucose-Capillary: 131 mg/dL — ABNORMAL HIGH (ref 65–99)
Glucose-Capillary: 214 mg/dL — ABNORMAL HIGH (ref 65–99)
Glucose-Capillary: 216 mg/dL — ABNORMAL HIGH (ref 65–99)

## 2017-07-08 LAB — STREP PNEUMONIAE URINARY ANTIGEN: Strep Pneumo Urinary Antigen: NEGATIVE

## 2017-07-08 LAB — BASIC METABOLIC PANEL
Anion gap: 18 — ABNORMAL HIGH (ref 5–15)
BUN: 50 mg/dL — AB (ref 6–20)
CHLORIDE: 91 mmol/L — AB (ref 101–111)
CO2: 33 mmol/L — ABNORMAL HIGH (ref 22–32)
Calcium: 9.4 mg/dL (ref 8.9–10.3)
Creatinine, Ser: 1.13 mg/dL — ABNORMAL HIGH (ref 0.44–1.00)
GFR calc Af Amer: 51 mL/min — ABNORMAL LOW (ref >60)
GFR calc non Af Amer: 44 mL/min — ABNORMAL LOW (ref >60)
Glucose, Bld: 125 mg/dL — ABNORMAL HIGH (ref 65–99)
POTASSIUM: 3.7 mmol/L (ref 3.5–5.1)
SODIUM: 142 mmol/L (ref 135–145)

## 2017-07-08 LAB — PHOSPHORUS: Phosphorus: 1.4 mg/dL — ABNORMAL LOW (ref 2.5–4.6)

## 2017-07-08 LAB — HIV ANTIBODY (ROUTINE TESTING W REFLEX): HIV Screen 4th Generation wRfx: NONREACTIVE

## 2017-07-08 LAB — MAGNESIUM: Magnesium: 2.4 mg/dL (ref 1.7–2.4)

## 2017-07-08 MED ORDER — DEXTROSE 5 % IV SOLN
1.0000 g | Freq: Three times a day (TID) | INTRAVENOUS | Status: DC
  Administered 2017-07-09 (×2): 1 g via INTRAVENOUS
  Filled 2017-07-08 (×4): qty 1

## 2017-07-08 MED ORDER — DEXTROSE 5 % IV SOLN
30.0000 mmol | Freq: Once | INTRAVENOUS | Status: AC
  Administered 2017-07-08: 30 mmol via INTRAVENOUS
  Filled 2017-07-08: qty 10

## 2017-07-08 MED ORDER — FUROSEMIDE 10 MG/ML IJ SOLN
40.0000 mg | Freq: Once | INTRAMUSCULAR | Status: AC
  Administered 2017-07-08: 40 mg via INTRAVENOUS
  Filled 2017-07-08: qty 4

## 2017-07-08 MED ORDER — VITAL HIGH PROTEIN PO LIQD
1000.0000 mL | ORAL | Status: DC
  Administered 2017-07-08 – 2017-07-11 (×4): 1000 mL

## 2017-07-08 MED ORDER — FUROSEMIDE 10 MG/ML IJ SOLN
40.0000 mg | Freq: Three times a day (TID) | INTRAMUSCULAR | Status: AC
  Administered 2017-07-08 (×2): 40 mg via INTRAVENOUS
  Filled 2017-07-08 (×2): qty 4

## 2017-07-08 NOTE — Progress Notes (Signed)
Initial Nutrition Assessment  DOCUMENTATION CODES:  Morbid obesity  INTERVENTION:  Initiate TF via OGT with Vital High Protein at goal rate of 50 ml/h (1200 ml per day) to provide 1200 kcals (+356 from diprivan), 105 gm protein, 1003 ml free water daily.  NUTRITION DIAGNOSIS:  Inadequate oral intake related to inability to eat as evidenced by NPO status.  GOAL:  Provide needs based on ASPEN/SCCM guidelines  MONITOR:  Labs, Weight trends, Vent status, I & O's, TF tolerance  REASON FOR ASSESSMENT:  Consult Enteral/tube feeding initiation and management  ASSESSMENT:  84 y/o female PMHx Chronic respiratory failure r/t morbid obesity requiring BIPAP (noncompliant), HF, CKD, OSA, HTN, GERD, DM2, bedbound. Presented w/ AMS. In ED was found to have interstitial edema and opacities concerning for PNA. Pt eventually intubated due to progressive hypercapnia. RD consulted for TF.   Pt intubated, sedated. Family is at bedside. They deny any significant changes with the patient prior to presentation. No n/v/c/d or changes in PO intake.  They  report that patient has always had difficulties breathing, but these never affected her appetite. They do not believe the patient took any vitamins/minerals at home. THey do not know the patients UBW.   Per chart, patients weight fluctuates, but does appear to exhibit a downward trend, though not able to decipher clearly if she has lost a clinically significant amount of weight.   Physical Exam: Morbidly obese, no edema  Patient is currently intubated on ventilator support MV: 7.0 L/min Temp (24hrs), Avg:98.6 F (37 C), Min:98.2 F (36.8 C), Max:98.9 F (37.2 C)  Propofol: 13.8 ml/hr= 364 kcals/day  Labs: 90-130 bgs, wbc:10.8, phos:1.4, BUN/Creat:50/1.13, TG: 90 Meds: Lasix, Insulin, IVF, IV abx, Sedated w/ Propofol, K phos, IV PPi   Recent Labs  Lab 07/07/17 1322 07/08/17 0524  NA 144 142  K 4.9 3.7  CL 90* 91*  CO2 41* 33*  BUN 51* 50*   CREATININE 0.93 1.13*  CALCIUM 10.0 9.4  MG  --  2.4  PHOS  --  1.4*  GLUCOSE 154* 125*   NUTRITION - FOCUSED PHYSICAL EXAM:   Most Recent Value  Orbital Region  No depletion  Upper Arm Region  No depletion  Thoracic and Lumbar Region  No depletion  Buccal Region  No depletion  Temple Region  No depletion  Clavicle Bone Region  No depletion  Clavicle and Acromion Bone Region  No depletion  Scapular Bone Region  No depletion  Dorsal Hand  No depletion  Patellar Region  No depletion  Anterior Thigh Region  No depletion  Posterior Calf Region  No depletion      Diet Order:  Diet NPO time specified  EDUCATION NEEDS:  No education needs have been identified at this time  Skin:  Skin Assessment: Reviewed RN Assessment  Last BM:  Unknown  Height:  Ht Readings from Last 1 Encounters:  07/07/17 5' (1.524 m)   Weight:  Wt Readings from Last 1 Encounters:  07/08/17 250 lb 3.6 oz (113.5 kg)   Wt Readings from Last 10 Encounters:  07/08/17 250 lb 3.6 oz (113.5 kg)  06/06/17 259 lb 0.7 oz (117.5 kg)  04/25/17 265 lb (120.2 kg)  03/20/17 257 lb 4.4 oz (116.7 kg)  12/29/16 278 lb 12.8 oz (126.5 kg)  04/13/16 285 lb (129.3 kg)  01/21/15 275 lb (124.7 kg)  12/31/14 275 lb (124.7 kg)  04/16/14 277 lb 12.5 oz (126 kg)  12/04/13 283 lb 3.2 oz (128.5 kg)  Ideal Body Weight:  45.45 kg  BMI:  Body mass index is 48.87 kg/m.  Estimated Nutritional Needs:  Kcal:  1250-1590 kcals (11-14 kcal/kg bw) Protein:  91-114g Pro (2-2.5g/kg ibw) Fluid:  Per MDs goals  Burtis Junes RD, LDN, CNSC Clinical Nutrition Pager: [phone] 07/08/2017 1:45 PM

## 2017-07-08 NOTE — Progress Notes (Signed)
PULMONARY / CRITICAL CARE MEDICINE   Name: Makayla Rodriguez MRN: [id number] DOB: 1932/01/23    ADMISSION DATE:  07/07/2017 CONSULTATION DATE:  07/07/17  REFERRING MD:  Hospitalist    CHIEF COMPLAINT:  Altered mental status  HISTORY OF PRESENT ILLNESS:    Makayla Rodriguez is a 84 y.o. female with medical history significant of chronic respiratory failure driven by obesity and requiring use of BiPAP, CHF, CKD, OSA,  hypertension, GERD, diabetes mellitus who presented to the emergency department with confusion.  She has a history of chronic hyperbaric and hypoxic respiratory failure, is supposed to use supplementa oxygen during the day and stay on nocturnal BiPAP. However she is not compliant with this regimen.  She complains of oxygen leak from BiPAP and the home health nurses were trying to adjust the mask.  She was seen for the same issue yesterday at the [facility] pulmonology and and they request for [place] home visit was placed to assess patient's complain of leaking mask.  Patient is usually nonambulatory and in the morning she has some degree of confusion.  However today seems more confused   ED Course: On arrival patient was tachypneic with oxygen saturations 70-80%.  Her ABG showed hypoxia with pH 7.253 and PCO2 of 112 and pO2 133 on NR. Blood work demonstrated leukocytosis with white blood cell count 10.8 Chest x-ray stated bilateral interstitial edema and confluent opacity at the lateral aspects of the right lower lobe suspicious for pneumonia versus confluent pulmonary edema Patient was placed on BiPAP, given 80 mg of Lasix IV and started on IV vancomycin and cefepime.  We were contacted for evaluation due to the development of acute/chronic hypercapnic respiratory failure with encephalopathy. Patient required intubation and placement in the icu.  SUBJECTIVE:  No events overnight, failed weaning this AM due to low efforts  VITAL SIGNS: BP (!) 174/90 (BP Location: Left Leg)   Pulse 71    Temp 98.9 F (37.2 C) (Axillary)   Resp 17   Ht 5' (1.524 m)   Wt 113.5 kg (250 lb 3.6 oz)   SpO2 95%   BMI 48.87 kg/m   HEMODYNAMICS:    VENTILATOR SETTINGS: Vent Mode: PRVC FiO2 (%):  [40 %-60 %] 40 % Set Rate:  [16 bmp] 16 bmp Vt Set:  [450 mL] 450 mL PEEP:  [5 cmH20] 5 cmH20 Plateau Pressure:  [9 cmH20-18 cmH20] 17 cmH20  INTAKE / OUTPUT: I/O last 3 completed shifts: In: 1064 [I.V.:964; IV Piggyback:100] Out: 1000 [Urine:1000]  PHYSICAL EXAMINATION: General:  Unresponsive. Withdraws to tactile stimulation. Neuro:  Unresponsive. HEENT:  Pupils small, sluggishly reactive to light. No icterus. No stridor. Cardiovascular:  Distant s1s2, regular rhythm. No murmur, gallop or rub. Lungs:  Diminished breath sounds throughout. No wheezes, crackles or rhonchi.  Abdomen:  Morbidly obese. No elicited tenderness. Musculoskeletal:  No cyanosis Skin:  Warm to touch. +pedal edema.  LABS:  BMET Recent Labs  Lab 07/07/17 1322 07/08/17 0524  NA 144 142  K 4.9 3.7  CL 90* 91*  CO2 41* 33*  BUN 51* 50*  CREATININE 0.93 1.13*  GLUCOSE 154* 125*   Electrolytes Recent Labs  Lab 07/07/17 1322 07/08/17 0524  CALCIUM 10.0 9.4  MG  --  2.4  PHOS  --  1.4*   CBC Recent Labs  Lab 07/07/17 1322 07/08/17 0524  WBC 10.8* 10.8*  HGB 12.5 11.5*  HCT 42.3 38.2  PLT 199 159    Coag's No results for input(s): APTT, INR in the last  168 hours.  Sepsis Markers No results for input(s): LATICACIDVEN, PROCALCITON, O2SATVEN in the last 168 hours.  ABG Recent Labs  Lab 07/07/17 1350 07/07/17 1630 07/07/17 1828  PHART 7.253* 7.198* 7.394  PCO2ART 112* PENDING 80.3*  PO2ART 133* PENDING 88.0    Liver Enzymes No results for input(s): AST, ALT, ALKPHOS, BILITOT, ALBUMIN in the last 168 hours.  Cardiac Enzymes No results for input(s): TROPONINI, PROBNP in the last 168 hours.  Glucose Recent Labs  Lab 07/07/17 1700 07/07/17 1955 07/07/17 2309 07/08/17 0340  07/08/17 0840 07/08/17 1218  GLUCAP 123* 101* 92 119* 124* 131*    Imaging Dg Chest 1 View  Result Date: 07/07/2017 CLINICAL DATA:  SOB. Hx of CHF, diabetes, hypertension, sleep apnea, obesity hypoventilation syndrome. Former [phone]). EXAM: CHEST 1 VIEW COMPARISON:  Chest x-rays dated 06/14/2017, 03/18/2017 and 12/20/2016. FINDINGS: Mild cardiomegaly is stable. Overall cardiomediastinal silhouette is stable in size and configuration. Coarse interstitial markings noted bilaterally suggesting interstitial edema. More confluent opacity noted at the lateral aspects of the right lower lung most likely pneumonia or confluent pulmonary edema. Osseous structures about the chest are unremarkable. IMPRESSION: 1. Bilateral interstitial prominence, most likely interstitial edema related to volume overload/CHF. 2. Confluent opacity at the lateral aspects of the right lower lung, pneumonia versus confluent pulmonary edema. Recommend follow-up chest x-rays to ensure resolution. Electronically Signed   By: Franki Cabot M.D.   On: 07/07/2017 13:17   Ct Head Wo Contrast  Result Date: 07/07/2017 CLINICAL DATA:  Altered level of consciousness. EXAM: CT HEAD WITHOUT CONTRAST TECHNIQUE: Contiguous axial images were obtained from the base of the skull through the vertex without intravenous contrast. COMPARISON:  None. FINDINGS: Brain: There is no evidence of acute infarct, intracranial hemorrhage, mass, midline shift, or extra-axial fluid collection. Mild cerebral atrophy is most notable in the parietal regions. Patchy deep cerebral white matter hypodensities are nonspecific but compatible with mild chronic small vessel ischemic disease. Vascular: Calcified atherosclerosis at the skull base. No hyperdense vessel. Skull: No fracture or focal osseous lesion. Sinuses/Orbits: Paranasal sinuses and mastoid air cells are clear. Bilateral cataract extraction. Other: None. IMPRESSION: 1. No evidence of acute intracranial  abnormality. 2. Mild chronic small vessel ischemic disease. Electronically Signed   By: Logan Bores M.D.   On: 07/07/2017 14:51   Dg Chest Portable 1 View  Result Date: 07/07/2017 CLINICAL DATA:  Intubation EXAM: PORTABLE CHEST 1 VIEW COMPARISON:  Portable exam 1721 hours compared to 1305 hours FINDINGS: Tip of endotracheal tube projects 3.3 cm above carina. Stable heart size and mediastinal contours. Atherosclerotic calcification aorta. Bibasilar atelectasis. Mild BILATERAL perihilar infiltrates likely representing pulmonary edema, greater on LEFT. No pneumothorax. IMPRESSION: Mild pulmonary edema with bibasilar atelectasis. Tip of endotracheal tube projects 3.3 cm above carina. Electronically Signed   By: Lavonia Dana M.D.   On: 07/07/2017 17:50   Dg Abd Portable 1 View  Result Date: 07/07/2017 CLINICAL DATA:  Evaluate OG tube EXAM: PORTABLE ABDOMEN - 1 VIEW COMPARISON:  None. FINDINGS: The OG tube terminates in left upper quadrant. IMPRESSION: The OG tube terminates in the left upper quadrant. Electronically Signed   By: Dorise Bullion III M.D   On: 07/07/2017 19:10     DISCUSSION: 84 y.o. Female with acute on chronic hypercapnic respiratory failure.  ASSESSMENT / PLAN:  PULMONARY A: 1. Respiratory failure P:   1. Maintain full vent support 2. Lasix as ordered for pulmonary edema 3. Adjust vent for ABG 4. CXR in AM  CARDIOVASCULAR A:  1. Possible pulmonary edema vs pulmonary HTN P:  1. Cardiac monitoring in the icu 2. Echocardiogram pending 3. Lasix 40 mg IV q8 x2 doses  RENAL A:   1. Azotemia likely due to diuretic use at home. P:   1. Lasix as above 2. KVO IVF 3. Monitor bmp 4. K3PO4 30 mmol IV x1  GASTROINTESTINAL A:   None P:   1. Pepcid for gi prophylaxis  HEMATOLOGIC A:   None P:  1. Monitor the cbc intermittently.  INFECTIOUS A:   1. Possible pneumonia P:   1. Sputum culture 2. Empiric antibiotic therapy pending culture results 3. Respiratory  pcr  ENDOCRINE A:   1. DM  P:   1. Blood glucose monitoring. Possible glycemic control required with s/s insulin.  NEUROLOGIC A:   Probable metabolic encephalopathy. P:   RASS goal: -1 to -2 Propofol for sedation PRN fentanyl  FAMILY  - Updates: Family updated bedside  The patient is critically ill with multiple organ systems failure and requires high complexity decision making for assessment and support, frequent evaluation and titration of therapies, application of advanced monitoring technologies and extensive interpretation of multiple databases.   Critical Care Time devoted to patient care services described in this note is  35  Minutes. This time reflects time of care of this signee Dr Jennet Maduro. This critical care time does not reflect procedure time, or teaching time or supervisory time of PA/NP/Med student/Med Resident etc but could involve care discussion time.  Rush Farmer, M.D. [facility] Pulmonary/Critical Care Medicine. Pager: [phone]. After hours pager: [phone].  07/08/2017, 12:31 PM

## 2017-07-09 ENCOUNTER — Inpatient Hospital Stay (HOSPITAL_COMMUNITY): Payer: Medicare Other

## 2017-07-09 DIAGNOSIS — I361 Nonrheumatic tricuspid (valve) insufficiency: Secondary | ICD-10-CM

## 2017-07-09 DIAGNOSIS — Z7189 Other specified counseling: Secondary | ICD-10-CM

## 2017-07-09 LAB — CBC
HEMATOCRIT: 35.6 % — AB (ref 36.0–46.0)
HEMOGLOBIN: 10.9 g/dL — AB (ref 12.0–15.0)
MCH: 28.8 pg (ref 26.0–34.0)
MCHC: 30.6 g/dL (ref 30.0–36.0)
MCV: 93.9 fL (ref 78.0–100.0)
Platelets: 182 10*3/uL (ref 150–400)
RBC: 3.79 MIL/uL — AB (ref 3.87–5.11)
RDW: 13.9 % (ref 11.5–15.5)
WBC: 10.4 10*3/uL (ref 4.0–10.5)

## 2017-07-09 LAB — GLUCOSE, CAPILLARY
Glucose-Capillary: 184 mg/dL — ABNORMAL HIGH (ref 65–99)
Glucose-Capillary: 191 mg/dL — ABNORMAL HIGH (ref 65–99)
Glucose-Capillary: 277 mg/dL — ABNORMAL HIGH (ref 65–99)
Glucose-Capillary: 233 mg/dL — ABNORMAL HIGH (ref 65–99)
Glucose-Capillary: 253 mg/dL — ABNORMAL HIGH (ref 65–99)

## 2017-07-09 LAB — ECHOCARDIOGRAM COMPLETE
HEIGHTINCHES: 60 in
Weight: 4035.3 oz

## 2017-07-09 LAB — BASIC METABOLIC PANEL
ANION GAP: 16 — AB (ref 5–15)
BUN: 51 mg/dL — ABNORMAL HIGH (ref 6–20)
CALCIUM: 9 mg/dL (ref 8.9–10.3)
CO2: 34 mmol/L — ABNORMAL HIGH (ref 22–32)
Chloride: 92 mmol/L — ABNORMAL LOW (ref 101–111)
Creatinine, Ser: 1.03 mg/dL — ABNORMAL HIGH (ref 0.44–1.00)
GFR, EST AFRICAN AMERICAN: 57 mL/min — AB (ref >60)
GFR, EST NON AFRICAN AMERICAN: 49 mL/min — AB (ref >60)
Glucose, Bld: 204 mg/dL — ABNORMAL HIGH (ref 65–99)
POTASSIUM: 3 mmol/L — AB (ref 3.5–5.1)
Sodium: 142 mmol/L (ref 135–145)

## 2017-07-09 LAB — BLOOD GAS, ARTERIAL
ACID-BASE EXCESS: 17.2 mmol/L — AB (ref 0.0–2.0)
BICARBONATE: 41.9 mmol/L — AB (ref 20.0–28.0)
Drawn by: 51702
FIO2: 40
LHR: 16 {breaths}/min
O2 SAT: 94.7 %
PEEP/CPAP: 5 cmH2O
PH ART: 7.503 — AB (ref 7.350–7.450)
Patient temperature: 98.5
VT: 450 mL
pCO2 arterial: 53.8 mmHg — ABNORMAL HIGH (ref 32.0–48.0)
pO2, Arterial: 72.3 mmHg — ABNORMAL LOW (ref 83.0–108.0)

## 2017-07-09 LAB — MAGNESIUM: MAGNESIUM: 2.2 mg/dL (ref 1.7–2.4)

## 2017-07-09 LAB — PHOSPHORUS: PHOSPHORUS: 3.2 mg/dL (ref 2.5–4.6)

## 2017-07-09 MED ORDER — DOCUSATE SODIUM 50 MG/5ML PO LIQD
100.0000 mg | Freq: Two times a day (BID) | ORAL | Status: DC
  Administered 2017-07-09 – 2017-07-16 (×8): 100 mg
  Filled 2017-07-09 (×11): qty 10

## 2017-07-09 MED ORDER — POTASSIUM CHLORIDE 10 MEQ/100ML IV SOLN
10.0000 meq | INTRAVENOUS | Status: AC
  Administered 2017-07-09 (×4): 10 meq via INTRAVENOUS
  Filled 2017-07-09: qty 100

## 2017-07-09 MED ORDER — FUROSEMIDE 10 MG/ML IJ SOLN
40.0000 mg | Freq: Once | INTRAMUSCULAR | Status: DC
  Filled 2017-07-09: qty 4

## 2017-07-09 MED ORDER — DEXTROSE 5 % IV SOLN
1.0000 g | Freq: Two times a day (BID) | INTRAVENOUS | Status: DC
  Administered 2017-07-09 – 2017-07-10 (×2): 1 g via INTRAVENOUS
  Filled 2017-07-09 (×2): qty 1

## 2017-07-09 MED ORDER — POTASSIUM CHLORIDE 20 MEQ/15ML (10%) PO SOLN
40.0000 meq | Freq: Three times a day (TID) | ORAL | Status: AC
  Administered 2017-07-09 (×2): 40 meq
  Filled 2017-07-09 (×2): qty 30

## 2017-07-09 MED ORDER — MAGNESIUM HYDROXIDE 400 MG/5ML PO SUSP
15.0000 mL | Freq: Once | ORAL | Status: AC
  Administered 2017-07-09: 15 mL
  Filled 2017-07-09: qty 30

## 2017-07-09 MED ORDER — FUROSEMIDE 10 MG/ML IJ SOLN
40.0000 mg | Freq: Three times a day (TID) | INTRAMUSCULAR | Status: AC
  Administered 2017-07-09 (×2): 40 mg via INTRAVENOUS
  Filled 2017-07-09: qty 4

## 2017-07-09 NOTE — Progress Notes (Signed)
Patient placed back on Full Vent support due to Increased RR into the 40's.

## 2017-07-09 NOTE — Progress Notes (Signed)
Notified Dr. Nelda Marseille of pt's LBM being one week ago. Pt has tried MOM prior to admit with not results. MD ordered Colace scheduled and one time dose of MOM now.

## 2017-07-09 NOTE — Progress Notes (Signed)
PULMONARY / CRITICAL CARE MEDICINE   Name: Makayla Rodriguez MRN: [id number] DOB: 1932/05/02    ADMISSION DATE:  07/07/2017 CONSULTATION DATE:  07/07/17  REFERRING MD:  Hospitalist  CHIEF COMPLAINT:  Altered mental status  HISTORY OF PRESENT ILLNESS:    Makayla Rodriguez is a 84 y.o. female with medical history significant of chronic respiratory failure driven by obesity and requiring use of BiPAP, CHF, CKD, OSA,  hypertension, GERD, diabetes mellitus who presented to the emergency department with confusion.  She has a history of chronic hyperbaric and hypoxic respiratory failure, is supposed to use supplementa oxygen during the day and stay on nocturnal BiPAP. However she is not compliant with this regimen.  She complains of oxygen leak from BiPAP and the home health nurses were trying to adjust the mask.  She was seen for the same issue yesterday at the [facility] pulmonology and and they request for [place] home visit was placed to assess patient's complain of leaking mask.  Patient is usually nonambulatory and in the morning she has some degree of confusion.  However today seems more confused   ED Course: On arrival patient was tachypneic with oxygen saturations 70-80%.  Her ABG showed hypoxia with pH 7.253 and PCO2 of 112 and pO2 133 on NR. Blood work demonstrated leukocytosis with white blood cell count 10.8 Chest x-ray stated bilateral interstitial edema and confluent opacity at the lateral aspects of the right lower lobe suspicious for pneumonia versus confluent pulmonary edema Patient was placed on BiPAP, given 80 mg of Lasix IV and started on IV vancomycin and cefepime.  We were contacted for evaluation due to the development of acute/chronic hypercapnic respiratory failure with encephalopathy. Patient required intubation and placement in the icu.  SUBJECTIVE:  07/09/2017 failed weaning.  She will most likely need a one-way extubation near future.  VITAL SIGNS: BP (!) 129/95   Pulse (!)  122   Temp 99 F (37.2 C) (Axillary)   Resp 19   Ht 5' (1.524 m)   Wt 114.4 kg (252 lb 3.3 oz)   SpO2 (!) 87%   BMI 49.26 kg/m   HEMODYNAMICS:    VENTILATOR SETTINGS: Vent Mode: PRVC FiO2 (%):  [40 %] 40 % Set Rate:  [16 bmp] 16 bmp Vt Set:  [450 mL] 450 mL PEEP:  [5 cmH20] 5 cmH20 Pressure Support:  [5 cmH20] 5 cmH20 Plateau Pressure:  [12 cmH20-18 cmH20] 12 cmH20  INTAKE / OUTPUT: I/O last 3 completed shifts: In: 3306.9 [I.V.:1551.9; NG/GT:695; IV Piggyback:1060] Out: 2140 [Urine:2140]  PHYSICAL EXAMINATION: General: Elderly female who is obese and unable to wean from ventilator. HEENT: Endotracheal tube to vent PSY: Not available Neuro: Follows simple commands CV:'s are distant g PULM: Decreased excursion GN:OIBB, non-tender, bsx4 active  Extremities: warm/dry, 1+ edema  Skin: no rashes or lesions   LABS:  BMET Recent Labs  Lab 07/07/17 1322 07/08/17 0524 07/09/17 0446  NA 144 142 142  K 4.9 3.7 3.0*  CL 90* 91* 92*  CO2 41* 33* 34*  BUN 51* 50* 51*  CREATININE 0.93 1.13* 1.03*  GLUCOSE 154* 125* 204*   Electrolytes Recent Labs  Lab 07/07/17 1322 07/08/17 0524 07/09/17 0446  CALCIUM 10.0 9.4 9.0  MG  --  2.4 2.2  PHOS  --  1.4* 3.2   CBC Recent Labs  Lab 07/07/17 1322 07/08/17 0524 07/09/17 0446  WBC 10.8* 10.8* 10.4  HGB 12.5 11.5* 10.9*  HCT 42.3 38.2 35.6*  PLT 199 159 182  Coag's No results for input(s): APTT, INR in the last 168 hours.  Sepsis Markers No results for input(s): LATICACIDVEN, PROCALCITON, O2SATVEN in the last 168 hours.  ABG Recent Labs  Lab 07/07/17 1630 07/07/17 1828 07/09/17 0403  PHART 7.198* 7.394 7.503*  PCO2ART PENDING 80.3* 53.8*  PO2ART PENDING 88.0 72.3*    Liver Enzymes No results for input(s): AST, ALT, ALKPHOS, BILITOT, ALBUMIN in the last 168 hours.  Cardiac Enzymes No results for input(s): TROPONINI, PROBNP in the last 168 hours.  Glucose Recent Labs  Lab 07/08/17 1218  07/08/17 1612 07/08/17 1935 07/08/17 2358 07/09/17 0345 07/09/17 0817  GLUCAP 131* 127* 216* 214* 184* 191*    Imaging Dg Chest Port 1 View  Result Date: 07/09/2017 CLINICAL DATA:  Respiratory failure. EXAM: PORTABLE CHEST 1 VIEW COMPARISON:  07/07/2017 and prior exams FINDINGS: An endotracheal tube with tip 3 cm above the carina and NG tube entering the stomach with tip off the field of view again noted. The cardiomediastinal silhouette is unchanged in this mildly low volume film. Mild basilar atelectasis and mild pulmonary vascular congestion again noted. There is no evidence of pneumothorax. IMPRESSION: Unchanged appearance the chest with mild pulmonary vascular congestion and mild bibasilar atelectasis. Electronically Signed   By: Margarette Canada M.D.   On: 07/09/2017 06:42     DISCUSSION: 84 y.o. Female with acute on chronic hypercapnic respiratory failure.  ASSESSMENT / PLAN:  PULMONARY A: 1. Respiratory failure P:   1. Maintain full vent support 2. Lasix as ordered for pulmonary edema 3. Adjust vent for ABG 4. CXR in AM 5.  Spoke with family concerning now a limited CODE BLUE no chest compressions or shock.  Further discuss options for one-way extubation in the near future due to the fact she does have end-stage hypercarbic respiratory failure.  CARDIOVASCULAR A:  1. Possible pulmonary edema vs pulmonary HTN P:  1. Cardiac monitoring in the icu 2. Echocardiogram pending 3. Lasix 40 mg IV q8 x2 doses  RENAL Lab Results  Component Value Date   CREATININE 1.03 (H) 07/09/2017   CREATININE 1.13 (H) 07/08/2017   CREATININE 0.93 07/07/2017   CREATININE 0.8 09/18/2014   CREATININE 1.0 03/12/2014   CREATININE 1.2 (H) 11/08/2013   Recent Labs  Lab 07/07/17 1322 07/08/17 0524 07/09/17 0446  K 4.9 3.7 3.0*   Recent Labs  Lab 07/07/17 1322 07/08/17 0524 07/09/17 0446  NA 144 142 142   A:   1. Azotemia likely due to diuretic use at home. P:   1. Lasix repeat 2.  KVO IVF 3. Monitor bmp 4.  Replete electrolytes as needed  GASTROINTESTINAL A:   None P:   1. Pepcid for gi prophylaxis  HEMATOLOGIC Recent Labs    07/08/17 0524 07/09/17 0446  HGB 11.5* 10.9*    A:   None P:  1. Monitor the cbc intermittently.  INFECTIOUS A:   1. Possible pneumonia P:   1. Sputum culture 2. Empiric antibiotic therapy pending culture results 3. Respiratory pcr  ENDOCRINE CBG (last 3)  Recent Labs    07/08/17 2358 07/09/17 0345 07/09/17 0817  GLUCAP 214* 184* 191*    A:   1. DM  P:   1. Blood glucose monitoring. Possible glycemic control required with s/s insulin.  NEUROLOGIC A:   Probable metabolic encephalopathy. 07/09/2017 follows commands off sedation.  But unable to wean from ventilator. P:   RASS goal: -1 to -2 Propofol for sedation PRN fentanyl  FAMILY  - Updates: Family  updated bedside 07/09/2017 sister and niece were updated at length.  Establish a limited CODE BLUE no shock no chest compressions.  Further dialogue will be continued about possible one-way extubation in the near future.   App Cct 40 min  Richardson Landry Minor ACNP [facility] PCCM Pager [phone] till 1 pm If no answer page [phone] 07/09/2017, 11:50 AM  Attending Note:  84 year old female with COPD and PNA who presents to PCCM with respiratory failure due to COPD exacerbation.  Patient on exam the mental status is improving but not enough to support extubation with clear lungs bilaterally.  I reviewed CXR myself, ETT is in good position.  Continue weaning efforts as able.  Replace K.  Lasix 40 mg IV q8 x2 doses.  Spoke with daughter and granddaughter.  After discussion, goal is to treat til she is extubatable then once extubated she is not to be reintubated and become DNR.  As of now, LCB with no CPR or cardioversion.  PCCM will continue to follow.  The patient is critically ill with multiple organ systems failure and requires high complexity decision making for  assessment and support, frequent evaluation and titration of therapies, application of advanced monitoring technologies and extensive interpretation of multiple databases.   Critical Care Time devoted to patient care services described in this note is  35  Minutes. This time reflects time of care of this signee Dr Jennet Maduro. This critical care time does not reflect procedure time, or teaching time or supervisory time of PA/NP/Med student/Med Resident etc but could involve care discussion time.  Rush Farmer, M.D. [facility] Pulmonary/Critical Care Medicine. Pager: [phone]. After hours pager: [phone].

## 2017-07-09 NOTE — Progress Notes (Signed)
  Echocardiogram 2D Echocardiogram has been performed.  Makayla Rodriguez T Makayla Rodriguez 07/09/2017, 12:08 PM

## 2017-07-10 ENCOUNTER — Inpatient Hospital Stay (HOSPITAL_COMMUNITY): Payer: Medicare Other

## 2017-07-10 LAB — BASIC METABOLIC PANEL
Anion gap: 14 (ref 5–15)
BUN: 53 mg/dL — AB (ref 6–20)
CALCIUM: 9.1 mg/dL (ref 8.9–10.3)
CO2: 33 mmol/L — ABNORMAL HIGH (ref 22–32)
CREATININE: 1.2 mg/dL — AB (ref 0.44–1.00)
Chloride: 97 mmol/L — ABNORMAL LOW (ref 101–111)
GFR, EST AFRICAN AMERICAN: 47 mL/min — AB (ref >60)
GFR, EST NON AFRICAN AMERICAN: 41 mL/min — AB (ref >60)
Glucose, Bld: 227 mg/dL — ABNORMAL HIGH (ref 65–99)
Potassium: 4.2 mmol/L (ref 3.5–5.1)
SODIUM: 144 mmol/L (ref 135–145)

## 2017-07-10 LAB — GLUCOSE, CAPILLARY
Glucose-Capillary: 188 mg/dL — ABNORMAL HIGH (ref 65–99)
Glucose-Capillary: 214 mg/dL — ABNORMAL HIGH (ref 65–99)
Glucose-Capillary: 218 mg/dL — ABNORMAL HIGH (ref 65–99)
Glucose-Capillary: 228 mg/dL — ABNORMAL HIGH (ref 65–99)
Glucose-Capillary: 240 mg/dL — ABNORMAL HIGH (ref 65–99)
Glucose-Capillary: 242 mg/dL — ABNORMAL HIGH (ref 65–99)
Glucose-Capillary: 277 mg/dL — ABNORMAL HIGH (ref 65–99)

## 2017-07-10 LAB — CBC
HCT: 35.6 % — ABNORMAL LOW (ref 36.0–46.0)
Hemoglobin: 11 g/dL — ABNORMAL LOW (ref 12.0–15.0)
MCH: 28.9 pg (ref 26.0–34.0)
MCHC: 30.9 g/dL (ref 30.0–36.0)
MCV: 93.7 fL (ref 78.0–100.0)
PLATELETS: 184 10*3/uL (ref 150–400)
RBC: 3.8 MIL/uL — ABNORMAL LOW (ref 3.87–5.11)
RDW: 14.3 % (ref 11.5–15.5)
WBC: 11.3 10*3/uL — ABNORMAL HIGH (ref 4.0–10.5)

## 2017-07-10 LAB — BLOOD GAS, ARTERIAL
Acid-Base Excess: 18.7 mmol/L — ABNORMAL HIGH (ref 0.0–2.0)
Acid-Base Excess: 15.3 mmol/L — ABNORMAL HIGH (ref 0.0–2.0)
BICARBONATE: 39.4 mmol/L — AB (ref 20.0–28.0)
Bicarbonate: 47.3 mmol/L — ABNORMAL HIGH (ref 20.0–28.0)
Drawn by: 41422
FIO2: 80
FIO2: 50
Inspiratory PAP: 16
LHR: 16 {breaths}/min
O2 Saturation: 98.2 %
O2 Saturation: 95.6 %
PCO2 ART: 47.8 mmHg (ref 32.0–48.0)
PEEP: 5 cmH2O
Patient temperature: 98.6
Patient temperature: 99.9
VT: 450 mL
pH, Arterial: 7.198 — CL (ref 7.350–7.450)
pH, Arterial: 7.529 — ABNORMAL HIGH (ref 7.350–7.450)
pO2, Arterial: 142 mmHg — ABNORMAL HIGH (ref 83.0–108.0)
pO2, Arterial: 77.1 mmHg — ABNORMAL LOW (ref 83.0–108.0)

## 2017-07-10 LAB — TRIGLYCERIDES: Triglycerides: 138 mg/dL (ref <150)

## 2017-07-10 LAB — CULTURE, RESPIRATORY

## 2017-07-10 LAB — CULTURE, RESPIRATORY W GRAM STAIN: Culture: NORMAL

## 2017-07-10 LAB — PHOSPHORUS: PHOSPHORUS: 2.7 mg/dL (ref 2.5–4.6)

## 2017-07-10 LAB — MAGNESIUM: MAGNESIUM: 2.5 mg/dL — AB (ref 1.7–2.4)

## 2017-07-10 MED ORDER — IPRATROPIUM-ALBUTEROL 0.5-2.5 (3) MG/3ML IN SOLN
3.0000 mL | Freq: Three times a day (TID) | RESPIRATORY_TRACT | Status: DC
  Administered 2017-07-11 – 2017-07-14 (×9): 3 mL via RESPIRATORY_TRACT
  Filled 2017-07-10 (×8): qty 3

## 2017-07-10 MED ORDER — INSULIN GLARGINE 100 UNIT/ML ~~LOC~~ SOLN
10.0000 [IU] | Freq: Every day | SUBCUTANEOUS | Status: DC
  Administered 2017-07-10 – 2017-07-15 (×6): 10 [IU] via SUBCUTANEOUS
  Filled 2017-07-10 (×8): qty 0.1

## 2017-07-10 MED ORDER — SODIUM CHLORIDE 0.9 % IV SOLN
1.0000 g | Freq: Two times a day (BID) | INTRAVENOUS | Status: DC
  Administered 2017-07-10 – 2017-07-14 (×7): 1 g via INTRAVENOUS
  Filled 2017-07-10 (×9): qty 1

## 2017-07-10 NOTE — Progress Notes (Signed)
Preformed 2 recruitment maneuvers on patient per Dr. Pearline Cables. Sats improved.

## 2017-07-10 NOTE — Plan of Care (Signed)
Patient with no changes overnight. Vital signs WNL. Will continue to monitor.

## 2017-07-10 NOTE — Progress Notes (Signed)
Spoke with Dr. Pearline Cables on unit regarding diabetes recommendations from diabetes coordinator. Received order for Lantus as recommended.

## 2017-07-10 NOTE — Progress Notes (Signed)
Inpatient Diabetes Program Recommendations  AACE/ADA: New Consensus Statement on Inpatient Glycemic Control (2015)  Target Ranges:  Prepandial:   less than 140 mg/dL      Peak postprandial:   less than 180 mg/dL (1-2 hours)      Critically ill patients:  140 - 180 mg/dL   Lab Results  Component Value Date   GLUCAP 277 (H) 07/10/2017   HGBA1C 7.7 (H) 03/19/2017    Review of Glycemic ControlResults for Makayla Rodriguez, Makayla Rodriguez (MRN [id number]) as of 07/10/2017 12:17  Ref. Range 07/09/2017 20:21 07/10/2017 00:49 07/10/2017 04:02 07/10/2017 07:47 07/10/2017 11:37  Glucose-Capillary Latest Ref Range: 65 - 99 mg/dL 253 (H) 228 (H) 214 (H) 188 (H) 277 (H)   Diabetes history: Type 2 DM Outpatient Diabetes medications: Humalog 2-10 bid with lunch and supper, Lantus 10 units q HS Current orders for Inpatient glycemic control:  Novolog moderate q 4 hours  Inpatient Diabetes Program Recommendations:   Please add Lantus 10 units daily (this was home dose).   Thanks,  Adah Perl, RN, BC-ADM Inpatient Diabetes Coordinator Pager [phone] (8a-5p)

## 2017-07-10 NOTE — Progress Notes (Signed)
Spoke with Dr. Pearline Cables regarding pt's breathing. Pt is weaning on vent and alarming due to increased respiratory rate. Pt slows her breathing down when asked to by nursing staff. But begins breathing in the 30s shortly after. Began dropping sats to upper 80s. Will not be extubating today and will resume sedation per Dr. Pearline Cables. Discussed plan with family at bedside (sister, two brothers and niece).

## 2017-07-10 NOTE — Progress Notes (Signed)
PULMONARY / CRITICAL CARE MEDICINE   Name: Makayla Rodriguez MRN: [id number] DOB: 1932/05/18    ADMISSION DATE:  07/07/2017 CONSULTATION DATE:  07/07/17  REFERRING MD:  Hospitalist  CHIEF COMPLAINT:  Altered mental status  HISTORY OF PRESENT ILLNESS:    Makayla Rodriguez is a 84 y.o. female with medical history significant of chronic respiratory failure driven by obesity and requiring use of BiPAP, CHF, CKD, OSA,  hypertension, GERD, diabetes mellitus who presented to the emergency department with confusion.  She has a history of chronic hypercarbic and hypoxic respiratory failure, is supposed to use supplementa oxygen during the day and stay on nocturnal BiPAP. However she is not compliant with this regimen.  She complains of oxygen leak from BiPAP and the home health nurses were trying to adjust the mask.  She was seen for the same issue yesterday at the [facility] pulmonology and and they request for [place] home visit was placed to assess patient's complain of leaking mask.  Patient is usually nonambulatory and in the morning she has some degree of confusion.  However today seems more confused   ED Course: On arrival patient was tachypneic with oxygen saturations 70-80%.  Her ABG showed hypoxia with pH 7.253 and PCO2 of 112 and pO2 133 on NR. Blood work demonstrated leukocytosis with white blood cell count 10.8 Chest x-ray stated bilateral interstitial edema and confluent opacity at the lateral aspects of the right lower lobe suspicious for pneumonia versus confluent pulmonary edema Patient was placed on BiPAP, given 80 mg of Lasix IV and started on IV vancomycin and cefepime.  We were contacted for evaluation due to the development of acute/chronic hypercapnic respiratory failure with encephalopathy. Patient required intubation and placement in the icu.  SUBJECTIVE:  07/09/2017 failed weaning.  She will most likely need a one-way extubation near future.  VITAL SIGNS: BP (!) 90/57   Pulse 97    Temp 99.7 F (37.6 C) (Axillary)   Resp (!) 24   Ht 5' (1.524 m)   Wt 253 lb 1.4 oz (114.8 kg)   SpO2 92%   BMI 49.43 kg/m   HEMODYNAMICS:    VENTILATOR SETTINGS: Vent Mode: PRVC FiO2 (%):  [40 %-50 %] 50 % Set Rate:  [16 bmp] 16 bmp Vt Set:  [450 mL] 450 mL PEEP:  [5 cmH20] 5 cmH20 Pressure Support:  [5 cmH20] 5 cmH20 Plateau Pressure:  [16 cmH20-18 cmH20] 17 cmH20  INTAKE / OUTPUT: I/O last 3 completed shifts: In: 3334.5 [I.V.:479.5; NG/GT:1695; IV YQMGNOIBB:0488] Out: 8916 [Urine:3145; Stool:1]  PHYSICAL EXAMINATION: General: Elderly female who is obese sedated, and orally intubated. Neuro: Follows simple commands CV: S1 and S2 are distant and regular without murmur rub or gallop. PULM: Respirations are unlabored, she is not breathing above the current set ventilator rate.  There is symmetric air movement and no wheezes.   XI:HWTU, non-tender, bsx4 active  Extremities: warm/dry, 1+ edema  Skin: no rashes or lesions   LABS:  BMET Recent Labs  Lab 07/07/17 1322 07/08/17 0524 07/09/17 0446  NA 144 142 142  K 4.9 3.7 3.0*  CL 90* 91* 92*  CO2 41* 33* 34*  BUN 51* 50* 51*  CREATININE 0.93 1.13* 1.03*  GLUCOSE 154* 125* 204*   Electrolytes Recent Labs  Lab 07/07/17 1322 07/08/17 0524 07/09/17 0446  CALCIUM 10.0 9.4 9.0  MG  --  2.4 2.2  PHOS  --  1.4* 3.2   CBC Recent Labs  Lab 07/08/17 0524 07/09/17 0446 07/10/17 0419  WBC 10.8* 10.4 11.3*  HGB 11.5* 10.9* 11.0*  HCT 38.2 35.6* 35.6*  PLT 159 182 184    Coag's No results for input(s): APTT, INR in the last 168 hours.  Sepsis Markers No results for input(s): LATICACIDVEN, PROCALCITON, O2SATVEN in the last 168 hours.  ABG Recent Labs  Lab 07/07/17 1828 07/09/17 0403 07/10/17 0541  PHART 7.394 7.503* 7.529*  PCO2ART 80.3* 53.8* 47.8  PO2ART 88.0 72.3* 77.1*    Liver Enzymes No results for input(s): AST, ALT, ALKPHOS, BILITOT, ALBUMIN in the last 168 hours.  Cardiac Enzymes No  results for input(s): TROPONINI, PROBNP in the last 168 hours.  Glucose Recent Labs  Lab 07/09/17 1210 07/09/17 1702 07/09/17 2021 07/10/17 0049 07/10/17 0402 07/10/17 0747  GLUCAP 277* 233* 253* 228* 214* 188*    Imaging Dg Chest Port 1 View  Result Date: 07/10/2017 CLINICAL DATA:  Intubated, respiratory failure EXAM: PORTABLE CHEST 1 VIEW COMPARISON:  07/09/2017 FINDINGS: Endotracheal tube 2.7 cm above the carina. NG tube within the stomach with the tip not visualized. Increased bibasilar atelectasis obscuring the hemidiaphragms. Small pleural effusions not excluded. No large pneumothorax. Upper lobes remain clear. Heart is enlarged. Degenerative changes noted of the spine. Remote cholecystectomy. IMPRESSION: Lower lung volumes with increased bibasilar atelectasis. Query small effusions developing. Stable support apparatus Electronically Signed   By: Jerilynn Mages.  Shick M.D.   On: 07/10/2017 07:33     DISCUSSION: 84 y.o. Female with acute on chronic hypercapnic respiratory failure.  ASSESSMENT / PLAN:  PULMONARY A: 1. Respiratory failure.  Chest x-ray shows haziness of both the right and left heart border which I suspect represents atelectasis.  I am going to increase her PEEP today in order to recruit those areas before attempting to separate her from mechanical ventilation again on 2/12.  I would like to ensure an optimal milieu for respiratory muscle function before attempting to wean and will be checking a TSH, magnesium, and phosphorus. We do not have compelling evidence of an infectious component to her respiratory failure.  Echocardiogram shows normal LV systolic function but with LVH and I very much suspect that she has some component of diastolic failure.  Should she fail to tolerate transfer of breathing after recruiting her atelectatic segments, I will add a small dose of Coreg for her diastolic dysfunction. A limited CODE BLUE no chest compressions or shock.  Further discuss options  for one-way extubation in the near future due to the fact she does have end-stage hypercarbic respiratory failure.   RENAL Lab Results  Component Value Date   CREATININE 1.03 (H) 07/09/2017   CREATININE 1.13 (H) 07/08/2017   CREATININE 0.93 07/07/2017   CREATININE 0.8 09/18/2014   CREATININE 1.0 03/12/2014   CREATININE 1.2 (H) 11/08/2013   Recent Labs  Lab 07/07/17 1322 07/08/17 0524 07/09/17 0446  K 4.9 3.7 3.0*   Recent Labs  Lab 07/07/17 1322 07/08/17 0524 07/09/17 0446  NA 144 142 142   A:   1. Azotemia likely due to diuretic use at home. P:   1. Lasix repeat 2. KVO IVF 3. Monitor bmp 4.  Replete electrolytes as needed  GASTROINTESTINAL A:   None P:   1. Pepcid for gi prophylaxis  HEMATOLOGIC Recent Labs    07/09/17 0446 07/10/17 0419  HGB 10.9* 11.0*    A:   None P:  1. Monitor the cbc intermittently.  INFECTIOUS A:    Empiric antibiotic therapy pending culture results  Respiratory pcr is negative  ENDOCRINE CBG (  last 3)  Recent Labs    07/10/17 0049 07/10/17 0402 07/10/17 0747  GLUCAP 228* 214* 188*    A:   1. DM  P:   1. Blood glucose monitoring. Possible glycemic control required with s/s insulin.  NEUROLOGIC A:   Probable metabolic encephalopathy. 07/09/2017 follows commands off sedation.  But unable to wean from ventilator. P:   RASS goal: -1 to -2 Propofol for sedation PRN fentanyl  Greater than 32 minutes was spent in the care of this patient today     Lars Masson, MD [facility] P[facility] Pager [phone] till 1 pm If no answer page [phone] 07/10/2017, 11:27 AM

## 2017-07-10 NOTE — Progress Notes (Signed)
Pt weaned PS 5/5 for 3 hours. Placed pt back on previous PRVC settings d/t increased RR and decreased spo2.

## 2017-07-11 ENCOUNTER — Inpatient Hospital Stay (HOSPITAL_COMMUNITY): Payer: Medicare Other

## 2017-07-11 LAB — BASIC METABOLIC PANEL
Anion gap: 15 (ref 5–15)
BUN: 56 mg/dL — ABNORMAL HIGH (ref 6–20)
CO2: 32 mmol/L (ref 22–32)
Calcium: 9.2 mg/dL (ref 8.9–10.3)
Chloride: 99 mmol/L — ABNORMAL LOW (ref 101–111)
Creatinine, Ser: 1.19 mg/dL — ABNORMAL HIGH (ref 0.44–1.00)
GFR calc Af Amer: 48 mL/min — ABNORMAL LOW (ref >60)
GFR calc non Af Amer: 41 mL/min — ABNORMAL LOW (ref >60)
Glucose, Bld: 233 mg/dL — ABNORMAL HIGH (ref 65–99)
Potassium: 3.9 mmol/L (ref 3.5–5.1)
Sodium: 146 mmol/L — ABNORMAL HIGH (ref 135–145)

## 2017-07-11 LAB — COMPREHENSIVE METABOLIC PANEL
ALT: 13 U/L — AB (ref 14–54)
AST: 26 U/L (ref 15–41)
Albumin: 2.4 g/dL — ABNORMAL LOW (ref 3.5–5.0)
Alkaline Phosphatase: 40 U/L (ref 38–126)
Anion gap: 18 — ABNORMAL HIGH (ref 5–15)
BILIRUBIN TOTAL: 0.7 mg/dL (ref 0.3–1.2)
BUN: 60 mg/dL — AB (ref 6–20)
CO2: 29 mmol/L (ref 22–32)
CREATININE: 1.24 mg/dL — AB (ref 0.44–1.00)
Calcium: 9.6 mg/dL (ref 8.9–10.3)
Chloride: 101 mmol/L (ref 101–111)
GFR, EST AFRICAN AMERICAN: 45 mL/min — AB (ref >60)
GFR, EST NON AFRICAN AMERICAN: 39 mL/min — AB (ref >60)
Glucose, Bld: 210 mg/dL — ABNORMAL HIGH (ref 65–99)
POTASSIUM: 5.1 mmol/L (ref 3.5–5.1)
Sodium: 148 mmol/L — ABNORMAL HIGH (ref 135–145)
TOTAL PROTEIN: 6.3 g/dL — AB (ref 6.5–8.1)

## 2017-07-11 LAB — GLUCOSE, CAPILLARY
Glucose-Capillary: 173 mg/dL — ABNORMAL HIGH (ref 65–99)
Glucose-Capillary: 186 mg/dL — ABNORMAL HIGH (ref 65–99)
Glucose-Capillary: 189 mg/dL — ABNORMAL HIGH (ref 65–99)
Glucose-Capillary: 194 mg/dL — ABNORMAL HIGH (ref 65–99)
Glucose-Capillary: 224 mg/dL — ABNORMAL HIGH (ref 65–99)
Glucose-Capillary: 235 mg/dL — ABNORMAL HIGH (ref 65–99)

## 2017-07-11 LAB — D-DIMER, QUANTITATIVE: D-Dimer, Quant: 0.76 ug/mL-FEU — ABNORMAL HIGH (ref 0.00–0.50)

## 2017-07-11 LAB — MAGNESIUM: Magnesium: 2.5 mg/dL — ABNORMAL HIGH (ref 1.7–2.4)

## 2017-07-11 LAB — TSH: TSH: 1.269 u[IU]/mL (ref 0.350–4.500)

## 2017-07-11 LAB — PHOSPHORUS: Phosphorus: 3.2 mg/dL (ref 2.5–4.6)

## 2017-07-11 LAB — BRAIN NATRIURETIC PEPTIDE: B Natriuretic Peptide: 161.6 pg/mL — ABNORMAL HIGH (ref 0.0–100.0)

## 2017-07-11 MED ORDER — FUROSEMIDE 10 MG/ML IJ SOLN
20.0000 mg | Freq: Two times a day (BID) | INTRAMUSCULAR | Status: DC
  Administered 2017-07-11 – 2017-07-16 (×10): 20 mg via INTRAVENOUS
  Filled 2017-07-11 (×11): qty 2

## 2017-07-11 MED ORDER — VITAL HIGH PROTEIN PO LIQD
1000.0000 mL | ORAL | Status: DC
  Administered 2017-07-11: 1000 mL

## 2017-07-11 MED ORDER — IOPAMIDOL (ISOVUE-370) INJECTION 76%
INTRAVENOUS | Status: AC
  Administered 2017-07-11: 80 mL via INTRAVENOUS
  Filled 2017-07-11: qty 100

## 2017-07-11 MED ORDER — SODIUM CHLORIDE 0.9 % IV BOLUS (SEPSIS)
500.0000 mL | Freq: Once | INTRAVENOUS | Status: AC
  Administered 2017-07-11: 500 mL via INTRAVENOUS

## 2017-07-11 NOTE — Progress Notes (Signed)
Per Dr. Pearline Cables, pt sat goal 88-90%.

## 2017-07-11 NOTE — Progress Notes (Signed)
Pharmacy Antibiotic Note  Makayla Rodriguez is a 84 y.o. female admitted on 07/07/2017 with pneumonia.  Pharmacy managing vancomycin and cefepime dosing. D#5 of abx. WBC 11.3. SCr 1.24. nCrCl ~ 35-40 mL/min. Culture negative so far with improving oxygenation of the ventilator   Plan: -Vanc d/c'ed per MD   -Cefepime 2 gm IV Q 8 hours  -Monitor CBC, renal fx, cultures and clinical progress  Height: 5' (152.4 cm) Weight: 253 lb 1.4 oz (114.8 kg) IBW/kg (Calculated) : 45.5  Temp (24hrs), Avg:98.8 F (37.1 C), Min:97.3 F (36.3 C), Max:99.9 F (37.7 C)  Recent Labs  Lab 07/07/17 1322 07/08/17 0524 07/09/17 0446 07/10/17 0419 07/11/17 0435  WBC 10.8* 10.8* 10.4 11.3*  --   CREATININE 0.93 1.13* 1.03* 1.20* 1.19*    Estimated Creatinine Clearance: 41.4 mL/min (A) (by C-G formula based on SCr of 1.19 mg/dL (H)).    Allergies  Allergen Reactions  . Demerol [Meperidine] Anaphylaxis    Reaction: Hypoventilation after surgery  . Penicillins Anaphylaxis and Swelling    Has patient had a PCN reaction causing immediate rash, facial/tongue/throat swelling, SOB or lightheadedness with hypotension: Yes Has patient had a PCN reaction causing severe rash involving mucus membranes or skin necrosis: No Has patient had a PCN reaction that required hospitalization: Yes Has patient had a PCN reaction occurring within the last 10 years: No If all of the above answers are "NO", then may proceed with Cephalosporin use.   Marland Kitchen Phenergan [Promethazine Hcl] Anaphylaxis    Reaction: hypoventilation  . Ativan [Lorazepam] Other (See Comments)    Stopped breathing  . Codeine Nausea And Vomiting  . Macrobid [Nitrofurantoin] Nausea And Vomiting  . Relafen [Nabumetone] Itching and Swelling  . Sulfa Antibiotics Other (See Comments)    Unknown allergic reaction per sister    Antimicrobials this admission: Vanc 2/8>>2/12 Cefepime 2/8>  Dose adjustments this admission:   Microbiology results: MRSA:  neg 2/9 Resp panel neg  2/9 TA rare resp flora  2/8 MRSA PCR neg  2/8 BCx ngtd   Thank you for allowing pharmacy to be a part of this patient's care.  Albertina Parr, PharmD., BCPS Clinical Pharmacist Pager [phone]

## 2017-07-11 NOTE — Progress Notes (Signed)
PULMONARY / CRITICAL CARE MEDICINE   Name: Makayla Rodriguez MRN: [id number] DOB: 1931/09/26    ADMISSION DATE:  07/07/2017 CONSULTATION DATE:  07/07/17  REFERRING MD:  Hospitalist  CHIEF COMPLAINT:  Altered mental status  HISTORY OF PRESENT ILLNESS:    Makayla Rodriguez is a 84 y.o. female with medical history significant of chronic respiratory failure driven by obesity and requiring use of BiPAP, CHF, CKD, OSA,  hypertension, GERD, diabetes mellitus who presented to the emergency department with confusion.  She has a history of chronic hypercarbic and hypoxic respiratory failure, is supposed to use supplementa oxygen during the day and stay on nocturnal BiPAP. However she is not compliant with this regimen.  She complains of oxygen leak from BiPAP and the home health nurses were trying to adjust the mask.  She was seen for the same issue yesterday at the [facility] pulmonology and and they request for [place] home visit was placed to assess patient's complain of leaking mask.  Patient is usually nonambulatory and in the morning she has some degree of confusion.  However today seems more confused   ED Course: On arrival patient was tachypneic with oxygen saturations 70-80%.  Her ABG showed hypoxia with pH 7.253 and PCO2 of 112 and pO2 133 on NR. Blood work demonstrated leukocytosis with white blood cell count 10.8 Chest x-ray stated bilateral interstitial edema and confluent opacity at the lateral aspects of the right lower lobe suspicious for pneumonia versus confluent pulmonary edema Patient was placed on BiPAP, given 80 mg of Lasix IV and started on IV vancomycin and cefepime.  We were contacted for evaluation due to the development of acute/chronic hypercapnic respiratory failure with encephalopathy. Patient required intubation and placement in the icu.  SUBJECTIVE:  07/09/2017 failed weaning.  She will most likely need a one-way extubation near future.  VITAL SIGNS: BP (!) 145/75   Pulse 85    Temp (!) 97.3 F (36.3 C) (Oral)   Resp 15   Ht 5' (1.524 m)   Wt 253 lb 1.4 oz (114.8 kg)   SpO2 100%   BMI 49.43 kg/m   HEMODYNAMICS:    VENTILATOR SETTINGS: Vent Mode: PSV;CPAP FiO2 (%):  [50 %-60 %] 50 % Set Rate:  [16 bmp] 16 bmp Vt Set:  [450 mL] 450 mL PEEP:  [5 cmH20-8 cmH20] 8 cmH20 Pressure Support:  [10 cmH20] 10 cmH20 Plateau Pressure:  [17 cmH20-21 cmH20] 19 cmH20  INTAKE / OUTPUT: I/O last 3 completed shifts: In: 2866.8 [I.V.:466.8; NG/GT:1800; IV Piggyback:600] Out: 1966 [Urine:1965; Stool:1]  PHYSICAL EXAMINATION: General: Elderly female who is obese sedated, and orally intubated. Neuro: Follows simple commands CV: S1 and S2 are distant and regular without murmur rub or gallop. PULM: Respirations are unlabored, she is not breathing above the current set ventilator rate.  There is symmetric air movement and no wheezes.   YH:CWCB, non-tender, bsx4 active  Extremities: warm/dry, 1+ edema  Skin: no rashes or lesions   LABS:  BMET Recent Labs  Lab 07/09/17 0446 07/10/17 0419 07/11/17 0435  NA 142 144 146*  K 3.0* 4.2 3.9  CL 92* 97* 99*  CO2 34* 33* 32  BUN 51* 53* 56*  CREATININE 1.03* 1.20* 1.19*  GLUCOSE 204* 227* 233*   Electrolytes Recent Labs  Lab 07/09/17 0446 07/10/17 0419 07/11/17 0435  CALCIUM 9.0 9.1 9.2  MG 2.2 2.5* 2.5*  PHOS 3.2 2.7 3.2   CBC Recent Labs  Lab 07/08/17 0524 07/09/17 0446 07/10/17 0419  WBC 10.8* 10.4  11.3*  HGB 11.5* 10.9* 11.0*  HCT 38.2 35.6* 35.6*  PLT 159 182 184    Coag's No results for input(s): APTT, INR in the last 168 hours.  Sepsis Markers No results for input(s): LATICACIDVEN, PROCALCITON, O2SATVEN in the last 168 hours.  ABG Recent Labs  Lab 07/07/17 1828 07/09/17 0403 07/10/17 0541  PHART 7.394 7.503* 7.529*  PCO2ART 80.3* 53.8* 47.8  PO2ART 88.0 72.3* 77.1*    Liver Enzymes No results for input(s): AST, ALT, ALKPHOS, BILITOT, ALBUMIN in the last 168 hours.  Cardiac  Enzymes No results for input(s): TROPONINI, PROBNP in the last 168 hours.  Glucose Recent Labs  Lab 07/10/17 0747 07/10/17 1137 07/10/17 1543 07/10/17 1933 07/10/17 2333 07/11/17 0319  GLUCAP 188* 277* 240* 218* 242* 235*    Imaging No results found.   DISCUSSION: 84 y.o. Female with acute on chronic hypercapnic respiratory failure.  ASSESSMENT / PLAN:  PULMONARY A: 1. Respiratory failure.  Chest x-ray shows haziness of both the right and left heart border which I suspect represents atelectasis.  I am have increased her PEEP overnight, and she is tolerating an SBT this morning. I am awaiting a d-dimer this morning as she is profoundly immobile at home. If it is positive, will obtain a CTA prior to extubation. No overt metabolic impediment to central drive. TSH is normal. Ionized calcium is pending. Will consider provigil.  We do not have compelling evidence of an infectious component to her respiratory failure.  Echocardiogram shows normal LV systolic function but she has LVH and I very much suspect that she has some component of diastolic failure.  I have added a moderate dose of lasix back this morning. A limited CODE BLUE no chest compressions or shock.  Further discuss options for one-way extubation in the near future due to the fact she does have end-stage hypercarbic respiratory failure.   RENAL Lab Results  Component Value Date   CREATININE 1.19 (H) 07/11/2017   CREATININE 1.20 (H) 07/10/2017   CREATININE 1.03 (H) 07/09/2017   CREATININE 0.8 09/18/2014   CREATININE 1.0 03/12/2014   CREATININE 1.2 (H) 11/08/2013   Recent Labs  Lab 07/09/17 0446 07/10/17 0419 07/11/17 0435  K 3.0* 4.2 3.9   Recent Labs  Lab 07/09/17 0446 07/10/17 0419 07/11/17 0435  NA 142 144 146*   A:   1. Azotemia likely due to diuretic use at home. Creatinine is 1.1 today. Resuming lasix to eliminate diastolic dysfunction as a consideration as a component of her respiratory  failure  GASTROINTESTINAL A:   None P:   1. Pepcid for gi prophylaxis  HEMATOLOGIC Recent Labs    07/09/17 0446 07/10/17 0419  HGB 10.9* 11.0*    A:   None P:  1. Monitor the cbc intermittently.  INFECTIOUS A:    Empiric antibiotic therapy pending culture results.  Very much doubt an infectious component to respiratory failure  Respiratory pcr is negative  ENDOCRINE CBG (last 3)  Recent Labs    07/10/17 1933 07/10/17 2333 07/11/17 0319  GLUCAP 218* 242* 235*    A:   1. DM  P:   1. Blood glucose monitoring. Possible glycemic control required with s/s insulin.  NEUROLOGIC A:   Very alert and interactive today. Suspect hypercarbia was the issue.  Greater than 32 minutes was spent in the care of this patient today     Lars Masson, MD [facility] Pager [phone] till 1 pm If no answer page [phone] 07/11/2017, 8:29  AM

## 2017-07-11 NOTE — Progress Notes (Signed)
Pt weaned on PS 5/5 for 2 hours. Placed pt back on previous PRVC settings d/t RR of less than 8 while weaning.

## 2017-07-11 NOTE — Plan of Care (Signed)
Patient was only able to wean today for approx 2 hours.  CTA completed. Propofol restarted for patient comfort and anxiety.  Family to convene tomorrow morning in anticipation of potential one-way extubation. [place], [place]

## 2017-07-11 NOTE — Plan of Care (Signed)
Patient resting comfortably in bed. No significant changes overnight. Will continue to monitor.

## 2017-07-11 NOTE — Progress Notes (Signed)
Nutrition Follow-up  DOCUMENTATION CODES:   Morbid obesity  INTERVENTION:   Increase Vital High Protein to 55 ml/hr (1320 ml/day)  Provides: 1320 kcal, 115 grams protein, and 1103 ml free water.    NUTRITION DIAGNOSIS:   Inadequate oral intake related to inability to eat as evidenced by NPO status. Ongoing.   GOAL:   Provide needs based on ASPEN/SCCM guidelines  Progressing.   MONITOR:   Labs, Weight trends, Vent status, I & O's, TF tolerance  ASSESSMENT:   Pt with PMH of chronic respiratory failure d/t morbid obesity requiring BiPAP (noncompliant), HF, CKD, OSA, HTN, GERD, DM, and bedbound status admitted with progressive hypercapnia.    Pt discussed during ICU rounds and with RN.  Family opted for short term intubation and one-way extubation.  Patient is currently intubated on ventilator support MAP >80  Propofol: off since this am Medications reviewed and include: colace, lasix, SSI, lantus,  Labs reviewed: Na 148 (H) CBG's: [id number]    TF:  Vital High Protein at 50 ml/h (1200 ml per day) Provides 1200 kcals, 105 gm protein, 1003 ml free water daily.   Diet Order:  Diet NPO time specified  EDUCATION NEEDS:   No education needs have been identified at this time  Skin:  Skin Assessment: (MASD - under breasts)  Last BM:  2/10  Height:   Ht Readings from Last 1 Encounters:  07/07/17 5' (1.524 m)    Weight:   Wt Readings from Last 1 Encounters:  07/11/17 253 lb 1.4 oz (114.8 kg)    Ideal Body Weight:  45.45 kg  BMI:  Body mass index is 49.43 kg/m.  Estimated Nutritional Needs:   Kcal:  1250-1590 kcals (11-14 kcal/kg bw)  Protein:  91-114g Pro (2-2.5g/kg ibw)  Fluid:  Per MDs goals  Maylon Peppers RD, [place], [place] Pager [phone] After Hours Pager

## 2017-07-12 LAB — GLUCOSE, CAPILLARY
Glucose-Capillary: 187 mg/dL — ABNORMAL HIGH (ref 65–99)
Glucose-Capillary: 132 mg/dL — ABNORMAL HIGH (ref 65–99)
Glucose-Capillary: 161 mg/dL — ABNORMAL HIGH (ref 65–99)
Glucose-Capillary: 246 mg/dL — ABNORMAL HIGH (ref 65–99)
Glucose-Capillary: 188 mg/dL — ABNORMAL HIGH (ref 65–99)
Glucose-Capillary: 216 mg/dL — ABNORMAL HIGH (ref 65–99)

## 2017-07-12 LAB — BASIC METABOLIC PANEL
Anion gap: 13 (ref 5–15)
BUN: 56 mg/dL — AB (ref 6–20)
CHLORIDE: 102 mmol/L (ref 101–111)
CO2: 33 mmol/L — ABNORMAL HIGH (ref 22–32)
CREATININE: 1.18 mg/dL — AB (ref 0.44–1.00)
Calcium: 9.3 mg/dL (ref 8.9–10.3)
GFR calc Af Amer: 48 mL/min — ABNORMAL LOW (ref >60)
GFR calc non Af Amer: 41 mL/min — ABNORMAL LOW (ref >60)
Glucose, Bld: 200 mg/dL — ABNORMAL HIGH (ref 65–99)
Potassium: 3.6 mmol/L (ref 3.5–5.1)
Sodium: 148 mmol/L — ABNORMAL HIGH (ref 135–145)

## 2017-07-12 LAB — CULTURE, BLOOD (ROUTINE X 2)
CULTURE: NO GROWTH
CULTURE: NO GROWTH
SPECIAL REQUESTS: ADEQUATE
SPECIAL REQUESTS: ADEQUATE

## 2017-07-12 LAB — CALCIUM, IONIZED: Calcium, Ionized, Serum: 5.2 mg/dL (ref 4.5–5.6)

## 2017-07-12 MED ORDER — GUAIFENESIN ER 600 MG PO TB12: 600.0000 mg | ORAL_TABLET | Freq: Two times a day (BID) | ORAL | Status: DC

## 2017-07-12 MED ORDER — MODAFINIL 100 MG PO TABS
50.0000 mg | ORAL_TABLET | Freq: Every day | ORAL | Status: DC
  Administered 2017-07-12: 50 mg via ORAL
  Filled 2017-07-12: qty 1

## 2017-07-12 MED ORDER — ORAL CARE MOUTH RINSE
15.0000 mL | Freq: Two times a day (BID) | OROMUCOSAL | Status: DC
  Administered 2017-07-13 – 2017-07-16 (×5): 15 mL via OROMUCOSAL

## 2017-07-12 MED ORDER — RACEPINEPHRINE HCL 2.25 % IN NEBU
0.5000 mL | INHALATION_SOLUTION | Freq: Once | RESPIRATORY_TRACT | Status: AC
  Administered 2017-07-12: 0.5 mL via RESPIRATORY_TRACT

## 2017-07-12 MED ORDER — DEXAMETHASONE SODIUM PHOSPHATE 10 MG/ML IJ SOLN
6.0000 mg | Freq: Four times a day (QID) | INTRAMUSCULAR | Status: AC
  Administered 2017-07-12 – 2017-07-14 (×8): 6 mg via INTRAVENOUS
  Filled 2017-07-12 (×8): qty 1

## 2017-07-12 MED ORDER — RACEPINEPHRINE HCL 2.25 % IN NEBU
INHALATION_SOLUTION | RESPIRATORY_TRACT | Status: AC
  Administered 2017-07-12: 0.5 mL via RESPIRATORY_TRACT
  Filled 2017-07-12: qty 0.5

## 2017-07-12 MED ORDER — RACEPINEPHRINE HCL 2.25 % IN NEBU
0.5000 mL | INHALATION_SOLUTION | RESPIRATORY_TRACT | Status: AC | PRN
  Filled 2017-07-12: qty 0.5

## 2017-07-12 NOTE — Procedures (Signed)
Extubation Procedure Note  Patient Details:   Name: Makayla Rodriguez DOB: 03-05-1932 MRN: [id number]   Airway Documentation:   Positive cuff leak prior to extubation.   Evaluation  O2 sats: stable throughout Complications: No apparent complications Patient did tolerate procedure well. Bilateral Breath Sounds: Diminished, Clear   Yes  Pt extubated with RN at bedside to 4L [place]. No signs of distress or stridor noted.   Elwin Mocha 07/12/2017, 10:39 AM

## 2017-07-12 NOTE — Progress Notes (Addendum)
PULMONARY / CRITICAL CARE MEDICINE   Name: Kathleena Freeman MRN: [id number] DOB: 19-Feb-1932    ADMISSION DATE:  07/07/2017 CONSULTATION DATE:  07/07/17  REFERRING MD:  Hospitalist  CHIEF COMPLAINT:  Altered mental status  HISTORY OF PRESENT ILLNESS:    Norleen Xie is a 84 y.o. female with medical history significant of chronic respiratory failure driven by obesity and requiring use of BiPAP, CHF, CKD, OSA,  hypertension, GERD, diabetes mellitus who presented to the emergency department with confusion.  She has a history of chronic hypercarbic and hypoxic respiratory failure, is supposed to use supplementa oxygen during the day and stay on nocturnal BiPAP. However she is not compliant with this regimen.  She complains of oxygen leak from BiPAP and the home health nurses were trying to adjust the mask.  She was seen for the same issue yesterday at the [facility] pulmonology and and they request for [place] home visit was placed to assess patient's complain of leaking mask.  Patient is usually nonambulatory and in the morning she has some degree of confusion.  However today seems more confused   ED Course: On arrival patient was tachypneic with oxygen saturations 70-80%.  Her ABG showed hypoxia with pH 7.253 and PCO2 of 112 and pO2 133 on NR. Blood work demonstrated leukocytosis with white blood cell count 10.8 Chest x-ray stated bilateral interstitial edema and confluent opacity at the lateral aspects of the right lower lobe suspicious for pneumonia versus confluent pulmonary edema Patient was placed on BiPAP, given 80 mg of Lasix IV and started on IV vancomycin and cefepime.  We were contacted for evaluation due to the development of acute/chronic hypercapnic respiratory failure with encephalopathy. Patient required intubation and placement in the icu.  SUBJECTIVE:  She was switched to CPAP 5 pressure support 10 prior to my exam.  She is alert and nodding to questions during my examination.  She  is denying shortness of breath.  VITAL SIGNS: BP 106/64   Pulse 86   Temp (!) 97.5 F (36.4 C) (Axillary)   Resp 13   Ht 5' (1.524 m)   Wt 253 lb 8.5 oz (115 kg)   SpO2 99%   BMI 49.51 kg/m   HEMODYNAMICS:    VENTILATOR SETTINGS: Vent Mode: PSV;CPAP FiO2 (%):  [40 %-50 %] 40 % Set Rate:  [16 bmp] 16 bmp Vt Set:  [450 mL] 450 mL PEEP:  [8 cmH20] 8 cmH20 Pressure Support:  [10 cmH20] 10 cmH20 Plateau Pressure:  [17 cmH20-23 cmH20] 23 cmH20  INTAKE / OUTPUT: I/O last 3 completed shifts: In: 2986.9 [I.V.:256.2; NG/GT:1730.8; IV Piggyback:1000] Out: 2320 [Urine:2320]  PHYSICAL EXAMINATION: General: Elderly female who is obese orally intubated.  In no distress and nodding appropriately to questions Neuro: Follows simple commands CV: S1 and S2 are distant and regular with a 3 out of 6 systolic ejection murmur  PULM: Respirations are unlabored.  There is symmetric air movement, no wheezes, a normal I to E ratio, and some scattered rhonchi.   VO:ZDGU, non-tender, bsx4 active  Extremities: warm/dry, 1+ edema  Skin: no rashes or lesions   LABS:  BMET Recent Labs  Lab 07/11/17 0435 07/11/17 0859 07/12/17 0351  NA 146* 148* 148*  K 3.9 5.1 3.6  CL 99* 101 102  CO2 32 29 33*  BUN 56* 60* 56*  CREATININE 1.19* 1.24* 1.18*  GLUCOSE 233* 210* 200*   Electrolytes Recent Labs  Lab 07/09/17 0446 07/10/17 0419 07/11/17 0435 07/11/17 0859 07/12/17 0351  CALCIUM 9.0  9.1 9.2 9.6 9.3  MG 2.2 2.5* 2.5*  --   --   PHOS 3.2 2.7 3.2  --   --    CBC Recent Labs  Lab 07/08/17 0524 07/09/17 0446 07/10/17 0419  WBC 10.8* 10.4 11.3*  HGB 11.5* 10.9* 11.0*  HCT 38.2 35.6* 35.6*  PLT 159 182 184    Coag's No results for input(s): APTT, INR in the last 168 hours.  Sepsis Markers No results for input(s): LATICACIDVEN, PROCALCITON, O2SATVEN in the last 168 hours.  ABG Recent Labs  Lab 07/07/17 1828 07/09/17 0403 07/10/17 0541  PHART 7.394 7.503* 7.529*  PCO2ART  80.3* 53.8* 47.8  PO2ART 88.0 72.3* 77.1*    Liver Enzymes Recent Labs  Lab 07/11/17 0859  AST 26  ALT 13*  ALKPHOS 40  BILITOT 0.7  ALBUMIN 2.4*    Cardiac Enzymes No results for input(s): TROPONINI, PROBNP in the last 168 hours.  Glucose Recent Labs  Lab 07/11/17 1243 07/11/17 1541 07/11/17 1925 07/11/17 2336 07/12/17 0353 07/12/17 0804  GLUCAP 194* 189* 173* 224* 187* 132*    Imaging Ct Angio Chest Pe W Or Wo Contrast  Result Date: 07/11/2017 CLINICAL DATA:  Chronic respiratory failure. Intubation for recent pneumonia. Evaluate for pulmonary embolism. EXAM: CT ANGIOGRAPHY CHEST WITH CONTRAST TECHNIQUE: Multidetector CT imaging of the chest was performed using the standard protocol during bolus administration of intravenous contrast. Multiplanar CT image reconstructions and MIPs were obtained to evaluate the vascular anatomy. CONTRAST:  54mL ISOVUE-370 IOPAMIDOL (ISOVUE-370) INJECTION 76% COMPARISON:  Radiographs 07/10/2017.  CT 01/15/2015. FINDINGS: Cardiovascular: The pulmonary arteries are well opacified with contrast to the level of the subsegmental branches. There is no evidence of acute pulmonary embolism. There atherosclerosis of the aorta, great vessels and coronary arteries. No acute systemic arterial abnormalities are seen. Aortic valvular and mitral annular calcifications are present. The heart size is normal. There is no pericardial effusion. Mediastinum/Nodes: There are no enlarged mediastinal, hilar or axillary lymph nodes.Enteric tube extends into the stomach. The thyroid gland, trachea and esophagus demonstrate no significant findings. Lungs/Pleura: There is no pleural effusion or pneumothorax. Pleural based soft tissue mass at the left costophrenic angle is similar to multiple prior studies, measuring approximately 5.4 x 3.4 cm on image 98 of series 5. There is dependent airspace disease in the right lower lobe with associated air bronchograms. This likely  represents atelectasis, although aspiration pneumonia could have this appearance. Milder dependent left lower lobe opacity is probably atelectasis. Minimal patchy ground-glass opacities are present elsewhere throughout the lungs, superimposed on mild emphysema. There is a calcified right upper lobe granuloma. No suspicious pulmonary nodules. Upper abdomen: The visualized upper abdomen appears stable without acute findings. There is a stable small right adrenal adenoma and a stable small low-density lesion posteriorly in the right hepatic lobe. Musculoskeletal/Chest wall: There is no chest wall mass or suspicious osseous finding. Review of the MIP images confirms the above findings. IMPRESSION: 1. No evidence of acute pulmonary embolism or other acute vascular findings. 2. Dependent right-greater-than-left airspace opacities with air bronchograms, probably atelectasis. Aspiration in the right lower lobe is difficult to exclude. 3. No evidence of suspicious pulmonary nodule or mediastinal lymphadenopathy. 4. Stable pleural based mass at the left costophrenic angle consistent with a benign finding based on stability. Electronically Signed   By: Richardean Sale M.D.   On: 07/11/2017 12:59     DISCUSSION: 84 y.o. Female with acute on chronic hypercapnic respiratory failure.  ASSESSMENT / PLAN:  PULMONARY A:  1. Respiratory failure.  She is undergone an extensive evaluation for correctable components to her respiratory failure.  TSH and ionized calcium have been normal.  She does not have electrolyte abnormalities that should impede respiratory muscle function.  Because she is immobile and has a positive d-dimer a CTA of the chest was obtained yesterday which did not show any evidence of pulmonary emboli.  Did show a very small area in the right lower lobe with some air bronchograms but no real significant acute pathology to account for acute respiratory failure.  In summary it appears that her respiratory  failure is entirely related to chronic issues including COPD and obesity with obstruction and pickwickian physiology.  She is undergoing a spontaneous breathing trial as I dictate.  I added a small dose of Provigil this morning in the hopes of stimulating central drive.  The patient is sufficiently awake to have a meaningful conversation and I have discussed with her what we should do in the event that she fails extubation.  I have explained that even short-term survival will be dependent on BiPAP, and intervention which she has resisted in the past.  I will be again discussing this with the patient prior to extubation when family arrives later today.  Per my discussion with the patient's power of attorney yesterday, the patient's perceived desires would be to be extubated regardless of outcome.    Echocardiogram shows normal LV systolic function but she has LVH and I very much suspect that she has some component of diastolic failure.  I have resumed a moderate dose of lasix. A limited CODE BLUE no chest compressions or shock.     RENAL Lab Results  Component Value Date   CREATININE 1.18 (H) 07/12/2017   CREATININE 1.24 (H) 07/11/2017   CREATININE 1.19 (H) 07/11/2017   CREATININE 0.8 09/18/2014   CREATININE 1.0 03/12/2014   CREATININE 1.2 (H) 11/08/2013   Recent Labs  Lab 07/11/17 0435 07/11/17 0859 07/12/17 0351  K 3.9 5.1 3.6   Recent Labs  Lab 07/11/17 0435 07/11/17 0859 07/12/17 0351  NA 146* 148* 148*   A:   1. Azotemia likely due to diuretic use at home. Creatinine is 1.1 today. Resuming lasix to eliminate diastolic dysfunction as a consideration as a component of her respiratory failure  GASTROINTESTINAL A:   None P:   1. Pepcid for gi prophylaxis  HEMATOLOGIC Recent Labs    07/10/17 0419  HGB 11.0*    A:   None P:  1. Monitor the cbc intermittently.  INFECTIOUS A:    Continue Cefepime today, but do bot suspect a significant infectious component to  respiratory failure Respiratory pcr is negative  ENDOCRINE CBG (last 3)  Recent Labs    07/11/17 2336 07/12/17 0353 07/12/17 0804  GLUCAP 224* 187* 132*    A:   1. DM  P:   1. Blood glucose monitoring. ssi is in place.  NEUROLOGIC A:   Very alert and interactive today. Suspect hypercarbia was the issue.  Greater than 32 minutes was spent in the care of this patient with unstable respiratory status today.     Lars Masson, MD [facility] P[facility] Pager  [phone] 07/12/2017, 8:29 AM   Addendum: She ws extubated this morning with family present, she was alert and interactive post extubation, but subsequently developed stridor. Racemic epinephrine and decadron were added. She was placed on BiPAP and her respiratory distress improved, but her mental status is quite poor, and I suspect she  is substantially CO2 retaining. I explained that we will continue BiPAP and medicines, but in keeping with her wishes, we will not re intubate if she becomes overtly distressed. They understand that any sedation given for respiratory distress may undermine her breathing but express their understanding that they want her comfortable, even if that accelerates her demise.

## 2017-07-12 NOTE — Progress Notes (Signed)
   07/12/17 1400  Clinical Encounter Type  Visited With Patient and family together  Visit Type Follow-up;Spiritual support;Critical Care  Referral From Nurse  Consult/Referral To Chaplain  Spiritual Encounters  Spiritual Needs Prayer  Stress Factors  Patient Stress Factors Major life changes  Family Stress Factors Major life changes;Health changes;Family relationships  Chaplain visited with the PT.  The family was gathered around the bedside encouraging the PT to live on, they shared in memories and love with the PT.  The family requested that we pray together.

## 2017-07-13 LAB — GLUCOSE, CAPILLARY
Glucose-Capillary: 194 mg/dL — ABNORMAL HIGH (ref 65–99)
Glucose-Capillary: 168 mg/dL — ABNORMAL HIGH (ref 65–99)
Glucose-Capillary: 151 mg/dL — ABNORMAL HIGH (ref 65–99)
Glucose-Capillary: 254 mg/dL — ABNORMAL HIGH (ref 65–99)
Glucose-Capillary: 179 mg/dL — ABNORMAL HIGH (ref 65–99)

## 2017-07-13 LAB — CALCIUM, IONIZED: Calcium, Ionized, Serum: 5.3 mg/dL (ref 4.5–5.6)

## 2017-07-13 MED ORDER — FAMOTIDINE 20 MG PO TABS
20.0000 mg | ORAL_TABLET | Freq: Two times a day (BID) | ORAL | Status: DC
  Administered 2017-07-13 – 2017-07-16 (×5): 20 mg via ORAL
  Filled 2017-07-13 (×6): qty 1

## 2017-07-13 MED ORDER — MORPHINE SULFATE (PF) 4 MG/ML IV SOLN
1.0000 mg | INTRAVENOUS | Status: DC | PRN
  Administered 2017-07-16: 2 mg via INTRAVENOUS
  Filled 2017-07-13 (×2): qty 1

## 2017-07-13 MED ORDER — FLUTICASONE PROPIONATE 50 MCG/ACT NA SUSP
1.0000 | Freq: Every day | NASAL | Status: DC
  Administered 2017-07-13 – 2017-07-16 (×4): 1 via NASAL
  Filled 2017-07-13 (×2): qty 16

## 2017-07-13 MED ORDER — RESOURCE THICKENUP CLEAR PO POWD
ORAL | Status: DC | PRN
  Filled 2017-07-13 (×2): qty 125

## 2017-07-13 MED ORDER — CHLORHEXIDINE GLUCONATE 0.12 % MT SOLN
OROMUCOSAL | Status: AC
  Administered 2017-07-13: 15 mL via OROMUCOSAL
  Filled 2017-07-13: qty 15

## 2017-07-13 NOTE — Progress Notes (Signed)
PULMONARY / CRITICAL CARE MEDICINE   Name: Makayla Rodriguez MRN: [id number] DOB: 1931/09/27    ADMISSION DATE:  07/07/2017 CONSULTATION DATE:  07/07/17  REFERRING MD:  Hospitalist  CHIEF COMPLAINT:  Altered mental status  HISTORY OF PRESENT ILLNESS:    Makayla Rodriguez is a 84 y.o. female with medical history significant of chronic respiratory failure driven by obesity and requiring use of BiPAP, CHF, CKD, OSA,  hypertension, GERD, diabetes mellitus who presented to the emergency department with confusion.  She has a history of chronic hypercarbic and hypoxic respiratory failure, is supposed to use supplementa oxygen during the day and stay on nocturnal BiPAP. However she is not compliant with this regimen.  She complains of oxygen leak from BiPAP and the home health nurses were trying to adjust the mask.  She was seen for the same issue yesterday at the [facility] pulmonology and and they request for [place] home visit was placed to assess patient's complain of leaking mask.  Patient is usually nonambulatory and in the morning she has some degree of confusion.  However today seems more confused    SUBJECTIVE:  Extubated yesterday, no reintubation.  Required bipap intermittently overnight for dyspnea.  Some stridor post extubation, slightly improved with bipap, racemic epi neb, decadron.   No c/o this morning.  Wants to eat. Family at bedside.   VITAL SIGNS: BP (!) 125/57   Pulse 87   Temp 99.2 F (37.3 C)   Resp (!) 28   Ht 5' (1.524 m)   Wt 115 kg (253 lb 8.5 oz)   SpO2 94%   BMI 49.51 kg/m   HEMODYNAMICS:    VENTILATOR SETTINGS: Vent Mode: BIPAP FiO2 (%):  [40 %-60 %] 40 % Set Rate:  [8 bmp] 8 bmp PEEP:  [7 cmH20] 7 cmH20  INTAKE / OUTPUT: I/O last 3 completed shifts: In: 1298 [P.O.:100; I.V.:123; NG/GT:825; IV Piggyback:250] Out: 2075 [Urine:2075]  PHYSICAL EXAMINATION: General: obese, elderly female, NAD  Neuro: awake, alert, follows commands, chatting with family,  confused CV: distant  PULM: Respirations are unlabored on , some inspiratory stridor but no increased WOB, essentially clear, rare exp wheeze    AC:ZYSA, non-tender, bsx4 active  Extremities: warm/dry, 1+ edema  Skin: no rashes or lesions   LABS:  BMET Recent Labs  Lab 07/11/17 0435 07/11/17 0859 07/12/17 0351  NA 146* 148* 148*  K 3.9 5.1 3.6  CL 99* 101 102  CO2 32 29 33*  BUN 56* 60* 56*  CREATININE 1.19* 1.24* 1.18*  GLUCOSE 233* 210* 200*   Electrolytes Recent Labs  Lab 07/09/17 0446 07/10/17 0419 07/11/17 0435 07/11/17 0859 07/12/17 0351  CALCIUM 9.0 9.1 9.2 9.6 9.3  MG 2.2 2.5* 2.5*  --   --   PHOS 3.2 2.7 3.2  --   --    CBC Recent Labs  Lab 07/08/17 0524 07/09/17 0446 07/10/17 0419  WBC 10.8* 10.4 11.3*  HGB 11.5* 10.9* 11.0*  HCT 38.2 35.6* 35.6*  PLT 159 182 184    Coag's No results for input(s): APTT, INR in the last 168 hours.  Sepsis Markers No results for input(s): LATICACIDVEN, PROCALCITON, O2SATVEN in the last 168 hours.  ABG Recent Labs  Lab 07/07/17 1828 07/09/17 0403 07/10/17 0541  PHART 7.394 7.503* 7.529*  PCO2ART 80.3* 53.8* 47.8  PO2ART 88.0 72.3* 77.1*    Liver Enzymes Recent Labs  Lab 07/11/17 0859  AST 26  ALT 13*  ALKPHOS 40  BILITOT 0.7  ALBUMIN 2.4*  Cardiac Enzymes No results for input(s): TROPONINI, PROBNP in the last 168 hours.  Glucose Recent Labs  Lab 07/12/17 1208 07/12/17 1626 07/12/17 1941 07/12/17 2328 07/13/17 0337 07/13/17 0859  GLUCAP 161* 246* 188* 216* 194* 168*    Imaging No results found.   DISCUSSION: 84 y.o. Female with acute on chronic hypercapnic respiratory failure.  ASSESSMENT / PLAN:  PULMONARY Acute on chronic respiratory failure - multifactorial in setting COPD, decompensated OSA/OHS, severe deconditioning (has not been out of bed in 7-8 months per family).  Has previously had fairly extensive w/u for her chronic resp failure.  Most acute issues have been r/o  this admit with ned CTA chest, no obvious infiltrate, no fever or sig leukocytosis.  TSH and ionized calcium have been normal. Initially required intubation, extubated 2/13.  She has been on qhs and PRN bipap at home which she despises.  She is already LCB, no re-intubation.    PLAN -  See discussion below  PRN morphine  No further bipap  SLP for swallow eval -- although may let her eat for comfort regardless depending on resp status continue empiric abx for HCAP although seems less likely to be driving issue - can likely d/c in next 24 hrs  continue decadron for now - stop date in place  Pulmonary hygiene as able  Supplemental O2  Continue gentle diuresis  tx to floor  Will ask palliative care to see for home hospice set up     Azotemia likely due to diuretic use at home. Resuming lasix to eliminate diastolic dysfunction as a consideration as a component of her respiratory failure PLAN -  Continue gentle diuresis as above    DM  P:   SSI, lantus   NEUROLOGIC A:   Confusion - ?hypercarbia  PLAN - Supportive care    Discussed at length with patient's sister and niece at bedside.  They are pleasantly surprised that she did well overnight post extubation, but are concerned because she is still fairly short of breath and has required BiPAP intermittently throughout the night.  According to family the patient has been declining overall at home, requiring 24-hour care, has not been out of the bed in the last 7-8 months.  She has had extensive outpatient workup for her chronic respiratory failure with no clear diagnoses other than COPD and OSA/OHS which has been treated with BiPAP and unfortunately the patient has been fairly resistant to wearing the BiPAP.  Patient is more confused this morning, mostly wanting to eat.  After lengthy discussion with patient's sister and niece it seems that they would love to be able to get the patient home and would like to focus on her comfort.  To that  end we have established the following limitations:  No further BiPAP here or at home As needed morphine as needed for dyspnea They do not wish to return to the hospital if they are able to get the patient home with hospice Transfer out of the ICU Full DNR Will get swallow eval but depending on her respiratory status over the next 24-48 hours we may want to let her have comfort feeds regardless Continue antibiotics for now Breathing treatments and supplemental oxygen We will ask palliative care to discuss further and assist with home hospice   Nickolas Madrid, NP 07/13/2017  11:50 AM Pager: [phone] or [phone]

## 2017-07-13 NOTE — Evaluation (Signed)
Clinical/Bedside Swallow Evaluation Patient Details  Name: Makayla Rodriguez MRN: [id number] Date of Birth: [birth year]  Today's Date: 07/13/2017 Time: SLP Start Time (ACUTE ONLY): 1500 SLP Stop Time (ACUTE ONLY): 1532 SLP Time Calculation (min) (ACUTE ONLY): 32 min  Past Medical History:  Past Medical History:  Diagnosis Date  . Anxiety   . Arthritis   . CHF (congestive heart failure) ([place])    12/13 admission  . CKD (chronic kidney disease), stage III ([place])   . Complication of anesthesia    O2 sat drop during  and went into coma  . Diabetes mellitus without complication ([place])   . GERD (gastroesophageal reflux disease)   . Hypertension   . Obesity   . Obesity hypoventilation syndrome ([place])    uses O2 at home  . Seasonal allergies   . Shortness of breath    with annxiety attack  . Sleep apnea    CPAP   Past Surgical History:  Past Surgical History:  Procedure Laterality Date  .  fybroid tumors    . BREAST SURGERY    . CHOLECYSTECTOMY    . EYE SURGERY     cataracts  . HERNIA REPAIR     umbilical  . PARTIAL MASTECTOMY WITH NEEDLE LOCALIZATION Left 04/05/2013   Procedure: PARTIAL MASTECTOMY WITH NEEDLE LOCALIZATION;  Surgeon: Adin Hector, MD;  Location: [facility];  Service: General;  Laterality: Left;  . PILONIDAL CYST EXCISION     buttock  . TONSILLECTOMY     HPI:  84 year old with OSA/O chest, acute on chronic hypercarbic respiratory failure noncompliant with BiPAP. Intubated from 2/8 to 2/13. Plan is for home with hospice with a focus on comfort, no further BiPAP at home.    Assessment / Plan / Recommendation Clinical Impression  Pt demonstrates signs of an acute reversible dysphagia with probable decreased airway closure following 5 day intubation, further complicated by impaired pattern of breathing and swallowing in setting of COPD. Pt denies dysphagia at baseline, but hoarse vocal quality and shortness of breath suggest increased risk over the short term. Trials of  ice results in gentle throat clearing, mor esignificant with sips of water. Trialed nectar thick liquids with decreased but not fully absent signs of aspiration. Encouraged family to provide sips of nectar only if desired by pt, occasional ice chips also allowed. Pills may be given in puree. Will f/u tomorrow for tolerance and potential FEES if symptoms/dysphagia persist in order to further advance diet.  SLP Visit Diagnosis: Dysphagia, oropharyngeal phase (R13.12)    Aspiration Risk  Moderate aspiration risk    Diet Recommendation Nectar-thick liquid;Ice chips PRN after oral care Bites of puree/pudding/yogurt ok. meds whole in puree  Liquid Administration via: Cup;Straw Supervision: Full supervision/cueing for compensatory strategies Compensations: Slow rate;Small sips/bites Postural Changes: Seated upright at 90 degrees    Other  Recommendations Oral Care Recommendations: Oral care BID   Follow up Recommendations 24 hour supervision/assistance      Frequency and Duration min 2x/week  2 weeks       Prognosis        Swallow Study   General HPI: 84 year old with OSA/O chest, acute on chronic hypercarbic respiratory failure noncompliant with BiPAP. Intubated from 2/8 to 2/13. Plan is for home with hospice with a focus on comfort, no further BiPAP at home.  Type of Study: Bedside Swallow Evaluation Diet Prior to this Study: NPO Temperature Spikes Noted: No Respiratory Status: Nasal cannula History of Recent Intubation: Yes Length of Intubations (days): 5 days  Date extubated: 07/12/17 Behavior/Cognition: Alert;Distractible;Requires cueing Oral Care Completed by SLP: No Oral Cavity - Dentition: Adequate natural dentition Self-Feeding Abilities: Able to feed self Patient Positioning: Upright in bed Baseline Vocal Quality: Hoarse;Low vocal intensity;Breathy Volitional Cough: Strong Volitional Swallow: Able to elicit    Oral/Motor/Sensory Function Overall Oral Motor/Sensory  Function: Within functional limits   Ice Chips Ice chips: Impaired Pharyngeal Phase Impairments: Throat Clearing - Immediate   Thin Liquid Thin Liquid: Impaired Pharyngeal  Phase Impairments: Throat Clearing - Immediate    Nectar Thick Nectar Thick Liquid: Impaired Pharyngeal Phase Impairments: Throat Clearing - Immediate   Honey Thick Honey Thick Liquid: Not tested   Puree Puree: Within functional limits Presentation: Spoon   Solid   GO   Solid: Not tested       Herbie Baltimore, MA CCC-SLP [phone]  Lynann Beaver 07/13/2017,3:39 PM

## 2017-07-13 NOTE — Care Management Important Message (Signed)
Important Message  Patient Details  Name: Makayla Rodriguez MRN: [id number] Date of Birth: [birth year]   Medicare Important Message Given:  Yes    Vaidehi Braddy Montine Circle 07/13/2017, 12:18 PM

## 2017-07-13 NOTE — Progress Notes (Signed)
Nutrition Brief Note  Chart reviewed. Pt discussed during ICU rounds and with RN.  One-way extubation 2/13 with goal of comfort. No further nutrition support at this time. Pt has required BiPAP. Per RN she is NPO with plans for swallow eval to determine diet for comfort.  No further nutrition interventions warranted at this time.  Please re-consult as needed.   [place], [place], [place] Pager [phone] After Hours Pager

## 2017-07-13 NOTE — Progress Notes (Signed)
Pt taken off bipap and placed on 8L HFNC at this time. Pt trying to talk continuously and pulling on bipap mask. Pt with rhonchi/diminished BS throughout. VS within normal limits. Pt does drop her spo2 to 93% when she talks too much but no distress noted. RN at bedside and aware. RT will continue to monitor.

## 2017-07-14 ENCOUNTER — Encounter (HOSPITAL_COMMUNITY): Payer: Self-pay | Admitting: Primary Care

## 2017-07-14 ENCOUNTER — Other Ambulatory Visit: Payer: Self-pay

## 2017-07-14 DIAGNOSIS — R0689 Other abnormalities of breathing: Secondary | ICD-10-CM

## 2017-07-14 DIAGNOSIS — E662 Morbid (severe) obesity with alveolar hypoventilation: Secondary | ICD-10-CM

## 2017-07-14 DIAGNOSIS — Z515 Encounter for palliative care: Secondary | ICD-10-CM

## 2017-07-14 DIAGNOSIS — Z7189 Other specified counseling: Secondary | ICD-10-CM

## 2017-07-14 DIAGNOSIS — J810 Acute pulmonary edema: Secondary | ICD-10-CM

## 2017-07-14 DIAGNOSIS — G4733 Obstructive sleep apnea (adult) (pediatric): Secondary | ICD-10-CM

## 2017-07-14 LAB — GLUCOSE, CAPILLARY
Glucose-Capillary: 117 mg/dL — ABNORMAL HIGH (ref 65–99)
Glucose-Capillary: 131 mg/dL — ABNORMAL HIGH (ref 65–99)
Glucose-Capillary: 134 mg/dL — ABNORMAL HIGH (ref 65–99)
Glucose-Capillary: 141 mg/dL — ABNORMAL HIGH (ref 65–99)
Glucose-Capillary: 150 mg/dL — ABNORMAL HIGH (ref 65–99)
Glucose-Capillary: 162 mg/dL — ABNORMAL HIGH (ref 65–99)

## 2017-07-14 MED ORDER — ALBUTEROL SULFATE (2.5 MG/3ML) 0.083% IN NEBU
INHALATION_SOLUTION | RESPIRATORY_TRACT | Status: AC
  Filled 2017-07-14: qty 3

## 2017-07-14 MED ORDER — IPRATROPIUM-ALBUTEROL 0.5-2.5 (3) MG/3ML IN SOLN: 3.0000 mL | RESPIRATORY_TRACT | Status: DC | PRN

## 2017-07-14 NOTE — Consult Note (Signed)
Consultation Note Date: 07/14/2017   Patient Name: Makayla Rodriguez  DOB: 02-27-1932  MRN: [id number]  Age / Sex: 84 y.o., female  PCP: Darcus Austin, MD Referring Physician: Tawni Millers  Reason for Consultation: Establishing goals of care and Psychosocial/spiritual support  HPI/Patient Profile: 84 y.o. female  with past medical history of heart failure, obesity hypoventilation syndrome with the use of oxygen, high blood pressure, sleep apnea with the use of CPAP, GERD, diabetes, stage III kidney disease, arthritis, anxiety admitted on 07/07/2017 with acute on chronic respiratory failure.   Clinical Assessment and Goals of Care:  I have reviewed medical records including EPIC notes, labs and imaging, assessed the patient and then met at the bedside along with Makayla Rodriguez and her daughter, brother [name], to discuss diagnosis prognosis, GOC, EOL wishes, disposition and options.  I introduced Palliative Medicine as specialized medical care for people living with serious illness. It focuses on providing relief from the symptoms and stress of a serious illness. The goal is to improve quality of life for both the patient and the family.  We discussed a life review of the patient.  Patient is very talkative about her past life.  I encourage family members to continue life review.  As far as nutritional status we discussed pleasure feeding, the use of soft foods, and feeding her "like a toddler".  We also discussed eating less is normal at end of life.   We discussed their current illness and what it means in the larger context of their on-going co-morbidities.  Natural disease trajectory and expectations at EOL were discussed.  Prognosis discussed with permission.  Hospice and Palliative Care services outpatient were explained and offered, choice of provider was offered.  The allow the family request  hospice and palliative care of [facility] in-home services for comfort and dignity at end of life.  Questions and concerns were addressed.   The family was encouraged to call with questions or concerns.   Conference with case management related to disposition and needs.  Page to Dr. Cathlean Sauer for update.    Healthcare power of attorney NEXT OF KIN -patient names her sister, [name] as her healthcare surrogate.   SUMMARY OF RECOMMENDATIONS   Patient and family are agreeable to return home with services of hospice and palliative care of [facility] for comfort and dignity at end of life.  Code Status/Advance Care Planning:  DNR  Symptom Management:   Per hospice protocol  Palliative Prophylaxis:   Aspiration and Turn Reposition  Additional Recommendations (Limitations, Scope, Preferences):  Treat the treatable but no CPR, no intubation, we discussed the concept of do not rehospitalize  Psycho-social/Spiritual:   Desire for further Chaplaincy support:no  Additional Recommendations: Caregiving  Support/Resources and Education on Hospice  Prognosis:   < 4 weeks, or less would not be surprising based on respiratory status, functional status, frailty, bedbound status, albumin of 2.4.  Discharge Planning: Home with the benefits of hospice and palliative care of [facility] for comfort and dignity at end of life.  Primary Diagnoses: Present on Admission: . HCAP (healthcare-associated pneumonia) . Type II diabetes mellitus with renal manifestations ([place]) . OSA (obstructive sleep apnea) . Obesity hypoventilation syndrome ([place]) . HLD (hyperlipidemia) . Essential hypertension . Morbid obesity ([place]) . CKD (chronic kidney disease), stage III ([place]) . Acute on chronic respiratory failure with hypercapnia ([place]) . Respiratory failure ([place])   I have reviewed the medical record, interviewed the patient and family, and examined the patient. The following aspects are  pertinent.  Past Medical History:  Diagnosis Date  . Anxiety   . Arthritis   . CHF (congestive heart failure) ([place])    12/13 admission  . CKD (chronic kidney disease), stage III ([place])   . Complication of anesthesia    O2 sat drop during  and went into coma  . Diabetes mellitus without complication ([place])   . GERD (gastroesophageal reflux disease)   . Hypertension   . Obesity   . Obesity hypoventilation syndrome ([place])    uses O2 at home  . Seasonal allergies   . Shortness of breath    with annxiety attack  . Sleep apnea    CPAP   Social History   Socioeconomic History  . Marital status: Single    Spouse name: None  . Number of children: 0  . Years of education: None  . Highest education level: None  Social Needs  . Financial resource strain: None  . Food insecurity - worry: None  . Food insecurity - inability: None  . Transportation needs - medical: None  . Transportation needs - non-medical: None  Occupational History  . 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 Smoking status: Former Smoker    Packs/day: 0.50    Years: 40.00    Pack years: 20.00    Types: Cigarettes    Last attempt to quit: 05/30/1980    Years since quitting: 37.1  . Smokeless tobacco: Never Used  Substance and Sexual Activity  . Alcohol use: No  . Drug use: No  . Sexual activity: No  Other Topics Concern  . None  Social History Narrative   Never married   No children   Family History  Problem Relation Age of Onset  . Heart failure Mother   . Gallbladder disease Father   . Pancreatic cancer Sister    Scheduled Meds: . albuterol      . chlorhexidine gluconate (MEDLINE KIT)  15 mL Mouth Rinse BID  . docusate  100 mg Per Tube BID  . famotidine  20 mg Oral BID  . fluticasone  1 spray Each Nare Daily  . furosemide  20 mg Intravenous Q12H  . insulin aspart  0-15 Units Subcutaneous Q4H  . insulin glargine  10 Units Subcutaneous QHS  . mouth rinse  15 mL  Mouth Rinse q12n4p   Continuous Infusions: PRN Meds:.fentaNYL (SUBLIMAZE) injection, hydrALAZINE, hydroxypropyl methylcellulose / hypromellose, ipratropium-albuterol, midazolam, midazolam, morphine injection, RESOURCE THICKENUP CLEAR Medications Prior to Admission:  Prior to Admission medications   Medication Sig Start Date End Date Taking? Authorizing Provider  acetaminophen (TYLENOL) 500 MG tablet Take 500 mg by mouth every 6 (six) hours as needed for mild pain.    Yes [provider]  albuterol (PROVENTIL HFA;VENTOLIN HFA) 108 (90 Base) MCG/ACT inhaler Inhale 2 puffs into the lungs every 6 (six) hours as needed for wheezing or shortness of breath.   Yes [provider]  aspirin EC 81 MG tablet Take 81 mg by mouth daily.  Yes [provider]  atorvastatin (LIPITOR) 10 MG tablet Take 10 mg by mouth at bedtime. Reported on 05/14/2015 07/22/14  Yes [provider]  azelastine (ASTELIN) 0.1 % nasal spray Place 2 sprays into both nostrils 2 (two) times daily. Use in each nostril as directed 04/16/14  Yes Ollis, [name] L, NP  cetirizine (ZYRTEC) 10 MG tablet Take 10 mg by mouth daily. Reported on 05/14/2015   Yes [provider]  cholecalciferol (VITAMIN D) 1000 UNITS tablet Take 1,000 Units by mouth daily. Reported on 05/14/2015   Yes [provider]  CINNAMON PO Take 1 capsule by mouth at bedtime.   Yes [provider]  diclofenac sodium (VOLTAREN) 1 % GEL Apply 4 g topically 2 (two) times daily as needed (for joint pain).    Yes [provider]  Dietary Management Product (TOZAL PO) Take 1-2 tablets by mouth See admin instructions. Take 2 tablets by mouth every morning and 1 tablet at night (eye vitamin)   Yes [provider]  fluticasone (FLONASE) 50 MCG/ACT nasal spray Place 2 sprays into the nose at bedtime.    Yes [provider]  guaiFENesin (MUCINEX) 600 MG 12 hr tablet Take 1 tablet (600 mg total) by  mouth 2 (two) times daily. Patient taking differently: Take 1,200 mg by mouth daily.  06/06/17 06/06/18 Yes Georgette Shell, MD  HUMALOG 100 UNIT/ML injection Inject 2-10 Units into the skin 2 (two) times daily before lunch and supper. Per sliding scale: 150-200 2 units, 201-250, 4 units, 251-300 8 units, 301-350 10 units, >350 call doctor 01/19/17  Yes [provider]  hydroxypropyl methylcellulose (ISOPTO TEARS) 2.5 % ophthalmic solution Place 1 drop into both eyes 3 (three) times daily as needed (for dry eyes).   Yes [provider]  ipratropium-albuterol (DUONEB) 0.5-2.5 (3) MG/3ML SOLN Take 3 mLs by nebulization 3 (three) times daily. Patient taking differently: Take 3 mLs by nebulization 3 (three) times daily as needed (shortness of breath/wheezing).  03/20/17  Yes Barton Dubois, MD  LANTUS 100 UNIT/ML injection Inject 10 Units into the skin at bedtime. 01/19/17  Yes [provider]  magnesium hydroxide (MILK OF MAGNESIA) 400 MG/5ML suspension Take 15 mLs by mouth daily as needed for mild constipation.   Yes [provider]  nebivolol (BYSTOLIC) 5 MG tablet Take 5 mg by mouth daily. 05/20/12  Yes Barton Dubois, MD  Omega-3 Fatty Acids (OMEGA 3 PO) Take 1 capsule by mouth daily.    Yes [provider]  pantoprazole (PROTONIX) 40 MG tablet Take 40 mg by mouth daily.   Yes [provider]  senna (SENOKOT) 8.6 MG TABS tablet Take 2 tablets by mouth at bedtime.    Yes [provider]  sertraline (ZOLOFT) 50 MG tablet Take 100 mg by mouth daily.   Yes [provider]  sodium chloride (OCEAN) 0.65 % SOLN nasal spray Place 1 spray into both nostrils as needed for congestion. 04/16/14  Yes Ollis, [name] L, NP  torsemide (DEMADEX) 20 MG tablet Take 3 tablets (60 mg total) by mouth daily. Patient taking differently: Take 40 mg by mouth daily.  12/26/16  Yes Florencia Reasons, MD  traMADol (ULTRAM) 50 MG tablet Take 1 tablet (50 mg total) by  mouth every 12 (twelve) hours as needed (for pain.). Patient taking differently: Take 50 mg by mouth every 12 (twelve) hours as needed for moderate pain.  03/20/17  Yes Barton Dubois, MD   Allergies  Allergen Reactions  .  Demerol [Meperidine] Anaphylaxis    Reaction: Hypoventilation after surgery  . Penicillins Anaphylaxis and Swelling    Has patient had a PCN reaction causing immediate rash, facial/tongue/throat swelling, SOB or lightheadedness with hypotension: Yes Has patient had a PCN reaction causing severe rash involving mucus membranes or skin necrosis: No Has patient had a PCN reaction that required hospitalization: Yes Has patient had a PCN reaction occurring within the last 10 years: No If all of the above answers are "NO", then may proceed with Cephalosporin use.   Marland Kitchen Phenergan [Promethazine Hcl] Anaphylaxis    Reaction: hypoventilation  . Ativan [Lorazepam] Other (See Comments)    Stopped breathing  . Codeine Nausea And Vomiting  . Macrobid [Nitrofurantoin] Nausea And Vomiting  . Relafen [Nabumetone] Itching and Swelling  . Sulfa Antibiotics Other (See Comments)    Unknown allergic reaction per sister   Review of Systems  Unable to perform ROS: Acuity of condition    Physical Exam  Constitutional: She is oriented to person, place, and time. She appears ill.  Appears acutely/chronically ill, makes and keeps eye contact,  HENT:  Head: Normocephalic and atraumatic.  Pulmonary/Chest:  Work of breathing noted, but able to have extended conversation.  Abdominal: Soft. There is no tenderness.  Obese abdomen  Neurological: She is alert and oriented to person, place, and time.  Skin: Skin is warm and dry.  Psychiatric: Her mood appears not anxious. She is not agitated.  Nursing note and vitals reviewed.   Vital Signs: BP (!) 107/42 (BP Location: Left Arm)   Pulse 69   Temp 97.7 F (36.5 C) (Oral)   Resp 20   Ht 5' (1.524 m)   Wt 117 kg (257 lb 15 oz)   SpO2 96%    BMI 50.38 kg/m  Pain Assessment: No/denies pain   Pain Score: 0-No pain   SpO2: SpO2: 96 % O2 Device:SpO2: 96 % O2 Flow Rate: .O2 Flow Rate (L/min): 8 L/min  IO: Intake/output summary:   Intake/Output Summary (Last 24 hours) at 07/14/2017 1400 Last data filed at 07/14/2017 0850 Gross per 24 hour  Intake -  Output 850 ml  Net -850 ml    LBM: Last BM Date: 07/14/17 Baseline Weight: Weight: 115 kg (253 lb 8.5 oz) Most recent weight: Weight: 117 kg (257 lb 15 oz)     Palliative Assessment/Data:   Flowsheet Rows     Most Recent Value  Intake Tab  Unit at Time of Referral  Med/Surg Unit  Palliative Care Primary Diagnosis  Pulmonary  Date Notified  07/13/17  Palliative Care Type  Return patient Palliative Care  Reason for referral  Counsel Regarding Hospice  Date of Admission  07/07/17  Date first seen by Palliative Care  07/14/17  # of days Palliative referral response time  1 Day(s)  # of days IP prior to Palliative referral  6  Clinical Assessment  Palliative Performance Scale Score  30%  Pain Max last 24 hours  Not able to report  Pain Min Last 24 hours  Not able to report  Dyspnea Max Last 24 Hours  Not able to report  Dyspnea Min Last 24 hours  Not able to report  Psychosocial & Spiritual Assessment  Palliative Care Outcomes  Patient/Family meeting held?  Yes  Who was at the meeting?  Patient, Sister Watt Climes and her daughter, brother [name] at bedside  [place] goals of care, Provided advance care planning, Transitioned to hospice  Patient/Family wishes: Interventions discontinued/not  started   Mechanical Ventilation      Time In: 1200 Time Out: 1330 Time Total: 90 minutes  Greater than 50%  of this time was spent counseling and coordinating care related to the above assessment and plan.  Signed by: Drue Novel, NP   Please contact Palliative Medicine Team phone at [phone] for questions and concerns.  For individual provider:  See Shea Evans

## 2017-07-14 NOTE — Progress Notes (Addendum)
Hospice and [facility]) Hospital Liaison:  RN visit  Notified by [facility], of patient/family request for [facility] services at home after discharge.  Chart and patient information reviewed by [facility] physician.  Hospice eligibility approved.    Writer spoke with patient, Watt Climes (sister), Delana Meyer (home care), and nieces at bedside to initiate education related to hospice philosophy, services and team approach to care.  Patient/family verbalized understanding of information given.  Per discussion, plan is for discharge by [facility] on 07/15/17.  Please send signed and completed DNR form home with patient/family.  Patient will need prescriptions for discharge comfort medication.    DME needs have been discussed, patient currently has the following equipment in the home:  Hospital bed, wheelchair, O2 concentrator (5L), BSC, Bipap.  Patient/family requests the following DME for delivery to the home:  OBT, O2 concentrator (10L).  [facility] equipment manager has been notified and will contact [place] to arrange delivery to the home.  Home address has been verified and is correct in the chart.  Watt Climes (sister) is the family member to contact to arrange time of delivery at [phone].  Alternate number to contact caregivers to set up delivery is [phone] ([place], [place], or [place]).    [facility]erral [facility] aware of the above.  Completed discharge summary will need to be faxed to [facility] at [phone], when final.  Please notify [facility] when patient is ready to leave the unit at discharge.  (Call [phone] or [phone] after 5pm).  [facility] information and contact numbers given to [facility] during this visit.  Above information shared with Hassan Rowan, [facility].   Please call with any hospice related questions.   Thank you for this referral.   Edyth Gunnels, RN, [place] Hospital Liaison [phone]  All hospital liaisons are now on [place].

## 2017-07-14 NOTE — Progress Notes (Signed)
PROGRESS NOTE  Makayla Rodriguez OHY:[id number] DOB: 11-Sep-1931 DOA: 07/07/2017 PCP: Darcus Austin, MD   LOS: 7 days   Brief Narrative / Interim history: Makayla Rodriguez is an 84 year old caucasian female with a medical history of chronic respiratory failure driven by obesity and requiring use of BiPAP, CHF, stage 3 CKD, OSA, HTN, GERD, T2DM, obesity, and obesity hypoventilation syndrome. She presented to the ED on 2/8 with confusion and tachypnea with an O2 saturation in 70-80% range proving hypoxic. She has a history of chronic hypercarbic and hypoxic respiratory failure and is meant to follow a regiment of supplemental O2 during the day and nocturnal BiPAP but is non-compliant. Her ABG was drawn in the ED and showed significance for pH of 7.25, a pCO2 of 112 and pO2 of 133 on NR. Blood work showed leukocytosis with WBC count of 10.8. Chest x-ray stated bilateral interstitial edema and confluent opacity at the lateral aspects of the right lower lobe, suspicious for pneumonia. Placed on BiPAP and given 80 mg of Lasix and IV vancomycin and cefepime. She was intubated on 2/8 due to development of acute on chronic hypercapnic and hypoxic respiratory failure with encephalopathy. Patient was then moved to ICU. Patient was extubated on 2/13 with no reintubation. Patient's family mentioned on 2/14 that she is bedridden for the past 7-8 months. Patient and her family agree to want to be discharged and initiate hospice care in her own home.  Assessment & Plan: Principal Problem: Acute on chronic respiratory failure with hypercapnia ([place]); Obesity hypoventilation syndrome (HCC) - Continue management with high flow canula to maintain O2 saturation and maintain comfort -- currently on 8L/min.  - Discontinue BiPAP - Continue albuterol q3 prn - Morphine prn - Consult with Hospice.   Active Problems: Essential hypertension - Patient is currently off hypertensive medication  OSA (obstructive sleep apnea) -  Continue current management  Acute exacerbation of CHF (congestive heart failure) (HCC) - Resolved - Continue with lasix  Type II diabetes mellitus with renal manifestations (HCC) - Blood glucose monitoring (currently 134) stable around and continue management with SSI, lantus  CKD (chronic kidney disease), stage III (HCC) -  Current creatinine of 1.18  Morbid obesity (HCC) - BMI 50.3  DVT prophylaxis: SCDs Code Status: Full Family Communication: Discussed with patient and her family Disposition Plan: Patient to return home under hospice  Consultants:  - Hospice  Subjective: Patient was alert and able to communicate. Mentioned that she is not under any pain but is just having a problem with her breathing. She is currently DNR and wants to return to her home on hospice. Patient denies any headaches, paraesthesia, numbness, or pain. She does note that her stomach feels bubbly from time to time and a little bloated but not too uncomfortable.    Objective: Vitals:   07/13/17 2030 07/14/17 0445 07/14/17 0926 07/14/17 1001  BP: (!) 121/53 (!) 150/68  (!) 107/42  Pulse: 81 62  69  Resp: _0 Temp: 97.8 F (36.6 C) 98.3 F (36.8 C)  97.7 F (36.5 C)  TempSrc: Axillary Oral  Oral  SpO2: 100% 100% 100% 96%  Weight:  117 kg (257 lb 15 oz)    Height:        Intake/Output Summary (Last 24 hours) at 07/14/2017 1323 Last data filed at 07/14/2017 0850 Gross per 24 hour  Intake -  Output 1250 ml  Net -1250 ml   Filed Weights   07/13/17 1400 07/13/17 1639 07/14/17 0445  Weight:  115 kg (253 lb 8.5 oz) 117 kg (258 lb) 117 kg (257 lb 15 oz)    Examination:  Constitutional: No sign of acute distress, obese Respiratory: Diminished lung sounds bilaterally, no wheeze, crackle, or rhonci. Moderate WOB. Cardiovascular: Regular rate and rhythm, no murmurs / rubs / gallops. Positive LE edema. No carotid bruits Abdomen: no ttp. Bowel sounds positive.   Musculoskeletal: no clubbing /  cyanosis.  Skin: no rashes, lesions, ulcers.  Neurologic: Strength 4/5 in all 4.  Psychiatric: Normal judgment and insight. Alert and oriented x 3. Normal mood.    CBC: Recent Labs  Lab 07/08/17 0524 07/09/17 0446 07/10/17 0419  WBC 10.8* 10.4 11.3*  HGB 11.5* 10.9* 11.0*  HCT 38.2 35.6* 35.6*  MCV 97.4 93.9 93.7  PLT 159 182 009   Basic Metabolic Panel: Recent Labs  Lab 07/08/17 0524 07/09/17 0446 07/10/17 0419 07/11/17 0435 07/11/17 0859 07/12/17 0351  NA 142 142 144 146* 148* 148*  K 3.7 3.0* 4.2 3.9 5.1 3.6  CL 91* 92* 97* 99* 101 102  CO2 33* 34* 33* 32 29 33*  GLUCOSE 125* 204* 227* 233* 210* 200*  BUN 50* 51* 53* 56* 60* 56*  CREATININE 1.13* 1.03* 1.20* 1.19* 1.24* 1.18*  CALCIUM 9.4 9.0 9.1 9.2 9.6 9.3  MG 2.4 2.2 2.5* 2.5*  --   --   PHOS 1.4* 3.2 2.7 3.2  --   --    GFR: Estimated Creatinine Clearance: 42.3 mL/min (A) (by C-G formula based on SCr of 1.18 mg/dL (H)). Liver Function Tests: Recent Labs  Lab 07/11/17 0859  AST 26  ALT 13*  ALKPHOS 40  BILITOT 0.7  PROT 6.3*  ALBUMIN 2.4*   No results for input(s): LIPASE, AMYLASE in the last 168 hours. No results for input(s): AMMONIA in the last 168 hours. Coagulation Profile: No results for input(s): INR, PROTIME in the last 168 hours. Cardiac Enzymes: No results for input(s): CKTOTAL, CKMB, CKMBINDEX, TROPONINI in the last 168 hours. BNP (last 3 results) No results for input(s): PROBNP in the last 8760 hours. HbA1C: No results for input(s): HGBA1C in the last 72 hours. CBG: Recent Labs  Lab 07/13/17 2025 07/14/17 0026 07/14/17 0441 07/14/17 0805 07/14/17 1154  GLUCAP 179* 141* 131* 150* 162*   Lipid Profile: No results for input(s): CHOL, HDL, LDLCALC, TRIG, CHOLHDL, LDLDIRECT in the last 72 hours. Thyroid Function Tests: No results for input(s): TSH, T4TOTAL, FREET4, T3FREE, THYROIDAB in the last 72 hours. Anemia Panel: No results for input(s): VITAMINB12, FOLATE, FERRITIN, TIBC,  IRON, RETICCTPCT in the last 72 hours. Urine analysis:    Component Value Date/Time   COLORURINE YELLOW 06/04/2017 [place] 06/04/2017 0943   LABSPEC 1.011 06/04/2017 0943   PHURINE 5.0 06/04/2017 0943   GLUCOSEU NEGATIVE 06/04/2017 0943   HGBUR NEGATIVE 06/04/2017 0943   BILIRUBINUR NEGATIVE 06/04/2017 0943   KETONESUR NEGATIVE 06/04/2017 0943   PROTEINUR NEGATIVE 06/04/2017 0943   UROBILINOGEN 0.2 04/12/2014 1738   NITRITE POSITIVE (A) 06/04/2017 0943   LEUKOCYTESUR SMALL (A) 06/04/2017 0943   Sepsis Labs: Invalid input(s): PROCALCITONIN, LACTICIDVEN  Recent Results (from the past 240 hour(s))  Culture, blood (routine x 2) Call MD if unable to obtain prior to antibiotics being given     Status: None   Collection Time: 07/07/17  2:15 PM  Result Value Ref Range Status   Specimen Description BLOOD RIGHT WRIST  Final   Special Requests   Final    BOTTLES DRAWN AEROBIC  AND ANAEROBIC Blood Culture adequate volume   Culture   Final    NO GROWTH 5 DAYS Performed at [facility], [place] [address], [place], [place] [address]    Report Status 07/12/2017 FINAL  Final  Culture, blood (routine x 2) Call MD if unable to obtain prior to antibiotics being given     Status: None   Collection Time: 07/07/17  3:38 PM  Result Value Ref Range Status   Specimen Description BLOOD LEFT ANTECUBITAL  Final   Special Requests   Final    BOTTLES DRAWN AEROBIC AND ANAEROBIC Blood Culture adequate volume   Culture   Final    NO GROWTH 5 DAYS Performed at [facility], [place] [address], [place], [place] [address]    Report Status 07/12/2017 FINAL  Final  MRSA PCR Screening     Status: None   Collection Time: 07/07/17  5:55 PM  Result Value Ref Range Status   MRSA by PCR NEGATIVE NEGATIVE Final    Comment:        The GeneXpert MRSA Assay (FDA approved for NASAL specimens only), is one component of a comprehensive MRSA colonization surveillance program. It is  not intended to diagnose MRSA infection nor to guide or monitor treatment for MRSA infections. Performed at [facility], [place] [address] [place], [place] [address]   Culture, respiratory (NON-Expectorated)     Status: None   Collection Time: 07/08/17  3:30 AM  Result Value Ref Range Status   Specimen Description TRACHEAL ASPIRATE  Final   Special Requests NONE  Final   Gram Stain   Final    FEW WBC PRESENT, PREDOMINANTLY PMN RARE GRAM POSITIVE COCCI    Culture   Final    Consistent with normal respiratory flora. Performed at [facility], [place] [address], [place], [place] [address]    Report Status 07/10/2017 FINAL  Final  Respiratory Panel by PCR     Status: None   Collection Time: 07/08/17 10:25 AM  Result Value Ref Range Status   Adenovirus NOT DETECTED NOT DETECTED Final   Coronavirus 229E NOT DETECTED NOT DETECTED Final   Coronavirus HKU1 NOT DETECTED NOT DETECTED Final   Coronavirus NL63 NOT DETECTED NOT DETECTED Final   Coronavirus OC43 NOT DETECTED NOT DETECTED Final   Metapneumovirus NOT DETECTED NOT DETECTED Final   Rhinovirus / Enterovirus NOT DETECTED NOT DETECTED Final   Influenza A NOT DETECTED NOT DETECTED Final   Influenza A H1 NOT DETECTED NOT DETECTED Final   Influenza A H1 2009 NOT DETECTED NOT DETECTED Final   Influenza A H3 NOT DETECTED NOT DETECTED Final   Influenza B NOT DETECTED NOT DETECTED Final   Parainfluenza Virus 1 NOT DETECTED NOT DETECTED Final   Parainfluenza Virus 2 NOT DETECTED NOT DETECTED Final   Parainfluenza Virus 3 NOT DETECTED NOT DETECTED Final   Parainfluenza Virus 4 NOT DETECTED NOT DETECTED Final   Respiratory Syncytial Virus NOT DETECTED NOT DETECTED Final   Bordetella pertussis NOT DETECTED NOT DETECTED Final   Chlamydophila pneumoniae NOT DETECTED NOT DETECTED Final   Mycoplasma pneumoniae NOT DETECTED NOT DETECTED Final    Comment: Performed at [facility], [place] [address] [place], [place] [address]       Radiology Studies: No results found.   Scheduled Meds: . albuterol      . chlorhexidine gluconate (MEDLINE KIT)  15 mL Mouth Rinse BID  . docusate  100 mg Per  Tube BID  . famotidine  20 mg Oral BID  . fluticasone  1 spray Each Nare Daily  . furosemide  20 mg Intravenous Q12H  . insulin aspart  0-15 Units Subcutaneous Q4H  . insulin glargine  10 Units Subcutaneous QHS  . mouth rinse  15 mL Mouth Rinse q12n4p   Continuous Infusions:     Time spent: 20 minutes  Ave Filter, PA-S

## 2017-07-14 NOTE — Progress Notes (Signed)
PROGRESS NOTE    Makayla Rodriguez  MMN:[id number] DOB: [birth year] DOA: 07/07/2017 PCP: Darcus Austin, MD    Brief Narrative:  83 year old female who presented to the hospital with acute change in her mental status, positive somnolence, related to not using her BiPAP for several days.  Apparently significant leak and problems with her interface.  On the initial physical examination blood pressure 95/38, 87/33, heart rate 69, respiratory rate 24, oxygen saturation 88%.  Dry mucous membranes, her lungs were shown coarse breath sounds bilaterally, no wheezing, heart S1-S2 present rhythmic, 3 out of 6 systolic murmur, no gallops rubs, abdomen two-parent, nontender, no lower extremity edema.  Sodium 144, potassium 4.9, chloride 90, bicarb 41, glucose 154, BUN 51, creatinine 0.93, arterial blood gas 7.25/112/133/47.9/98%, white cell count 10.8, hemoglobin 12.5, hematocrit 42.3, platelets 199.  Head CT negative for acute changes.  Chest x-ray with significant hypoinflation, left rotation, right base infiltrate.  EKG with normal sinus rhythm, normal intervals, normal axis.   Patient was admitted to the hospital can diagnosis acute hypercapnic respiratory failure, complicated by community-acquired pneumonia and pulmonary edema.  Patient failed all of diuretics, antibiotics and noninvasive mechanical ventilation in the emergency department, she was intubated, placed on invasive mechanical ventilation and transferred to the intensive care unit.   Assessment & Plan:   Principal Problem:   Acute on chronic respiratory failure with hypercapnia (HCC) Active Problems:   Essential hypertension   Morbid obesity (HCC)   OSA (obstructive sleep apnea)   Obesity hypoventilation syndrome (HCC)   Acute exacerbation of CHF (congestive heart failure) (HCC)   Type II diabetes mellitus with renal manifestations (HCC)   HLD (hyperlipidemia)   CKD (chronic kidney disease), stage III ([place])   HCAP (healthcare-associated  pneumonia)   Respiratory failure ([place])   1. Acute on chronic hypoxic and hypercarbic respiratory failure. Patient has declined further non invasive mechanical ventilation, will continue supplemental 02 per [place], continue bronchodilator therapy. Patient will be discharge in am to hospice.   2. HTN. Patient off antihypertensive medications.   3. Diastolic heart failure acute on chronic. Euvolemic, will continue furosemide to keep negative fluid balance.   4. T2DM. Continue insulin sliding scale for glucose cover and monitoring, capillary glucose well controlled. Capillary glucose 141, 131, 150, 162, 134.   5. CKD stage 3. Renal function stable, will continue furosemide.   6. Morbid obesity . BMI 50. Conservative care.    DVT prophylaxis:. scd  Code Status: dnr Family Communication: I spoke with patient's family at the bedside and all questions were addressed.  Disposition Plan:  Home with hospice   Consultants:   Palliative care  Procedures:     Antimicrobials:       Subjective: Patient feeling well, no significant dyspnea, no nausea or vomiting, no chest pain. Patient has been non ambulatory before admission.  Objective: Vitals:   07/13/17 2030 07/14/17 0445 07/14/17 0926 07/14/17 1001  BP: (!) 121/53 (!) 150/68  (!) 107/42  Pulse: 81 62  69  Resp: _0 Temp: 97.8 F (36.6 C) 98.3 F (36.8 C)  97.7 F (36.5 C)  TempSrc: Axillary Oral  Oral  SpO2: 100% 100% 100% 96%  Weight:  117 kg (257 lb 15 oz)    Height:        Intake/Output Summary (Last 24 hours) at 07/14/2017 1229 Last data filed at 07/14/2017 0850 Gross per 24 hour  Intake -  Output 1250 ml  Net -1250 ml   [employer]  07/13/17 1400 07/13/17 1639 07/14/17 0445  Weight: 115 kg (253 lb 8.5 oz) 117 kg (258 lb) 117 kg (257 lb 15 oz)    Examination:   General: Positive dyspnea, deconditioned/ obese Neurology: Awake and alert, non focal  E ENT: mild pallor, no icterus, oral mucosa  moist Cardiovascular: No JVD. S1-S2 present, rhythmic, no gallops, rubs, or murmurs. No lower extremity edema. Pulmonary: decreased breath sounds bilaterally, adequate air movement, no wheezing, rhonchi or rales. Gastrointestinal. Abdomen protuberant, no organomegaly, non tender, no rebound or guarding Skin. No rashes Musculoskeletal: no joint deformities     Data Reviewed: I have personally reviewed following labs and imaging studies  CBC: Recent Labs  Lab 07/07/17 1322 07/08/17 0524 07/09/17 0446 07/10/17 0419  WBC 10.8* 10.8* 10.4 11.3*  NEUTROABS 8.8*  --   --   --   HGB 12.5 11.5* 10.9* 11.0*  HCT 42.3 38.2 35.6* 35.6*  MCV 99.5 97.4 93.9 93.7  PLT 199 159 182 144   Basic Metabolic Panel: Recent Labs  Lab 07/08/17 0524 07/09/17 0446 07/10/17 0419 07/11/17 0435 07/11/17 0859 07/12/17 0351  NA 142 142 144 146* 148* 148*  K 3.7 3.0* 4.2 3.9 5.1 3.6  CL 91* 92* 97* 99* 101 102  CO2 33* 34* 33* 32 29 33*  GLUCOSE 125* 204* 227* 233* 210* 200*  BUN 50* 51* 53* 56* 60* 56*  CREATININE 1.13* 1.03* 1.20* 1.19* 1.24* 1.18*  CALCIUM 9.4 9.0 9.1 9.2 9.6 9.3  MG 2.4 2.2 2.5* 2.5*  --   --   PHOS 1.4* 3.2 2.7 3.2  --   --    GFR: Estimated Creatinine Clearance: 42.3 mL/min (A) (by C-G formula based on SCr of 1.18 mg/dL (H)). Liver Function Tests: Recent Labs  Lab 07/11/17 0859  AST 26  ALT 13*  ALKPHOS 40  BILITOT 0.7  PROT 6.3*  ALBUMIN 2.4*   No results for input(s): LIPASE, AMYLASE in the last 168 hours. No results for input(s): AMMONIA in the last 168 hours. Coagulation Profile: No results for input(s): INR, PROTIME in the last 168 hours. Cardiac Enzymes: No results for input(s): CKTOTAL, CKMB, CKMBINDEX, TROPONINI in the last 168 hours. BNP (last 3 results) No results for input(s): PROBNP in the last 8760 hours. HbA1C: No results for input(s): HGBA1C in the last 72 hours. CBG: Recent Labs  Lab 07/13/17 2025 07/14/17 0026 07/14/17 0441  07/14/17 0805 07/14/17 1154  GLUCAP 179* 141* 131* 150* 162*   Lipid Profile: No results for input(s): CHOL, HDL, LDLCALC, TRIG, CHOLHDL, LDLDIRECT in the last 72 hours. Thyroid Function Tests: No results for input(s): TSH, T4TOTAL, FREET4, T3FREE, THYROIDAB in the last 72 hours. Anemia Panel: No results for input(s): VITAMINB12, FOLATE, FERRITIN, TIBC, IRON, RETICCTPCT in the last 72 hours.    Radiology Studies: I have reviewed all of the imaging during this hospital visit personally     Scheduled Meds: . albuterol      . chlorhexidine gluconate (MEDLINE KIT)  15 mL Mouth Rinse BID  . docusate  100 mg Per Tube BID  . famotidine  20 mg Oral BID  . fluticasone  1 spray Each Nare Daily  . furosemide  20 mg Intravenous Q12H  . insulin aspart  0-15 Units Subcutaneous Q4H  . insulin glargine  10 Units Subcutaneous QHS  . mouth rinse  15 mL Mouth Rinse q12n4p   Continuous Infusions:   LOS: 7 days        Melainie Krinsky Gerome Apley, MD [facility] Pager [phone]

## 2017-07-14 NOTE — Progress Notes (Signed)
  Speech Language Pathology Treatment:    Patient Details Name: Makayla Rodriguez MRN: [id number] DOB: [birth year] Today's Date: 07/14/2017 Time: 1345-1410 SLP Time Calculation (min) (ACUTE ONLY): 25 min  Assessment / Plan / Recommendation Clinical Impression  Pt demonstrates improvement today subjectively while consuming thin liquid textures. Pt tolerated nectar thick liquids over the past 25 hours; caregivers reports many cups of nectar thick coffee were given per pts request. Pts vocal quality slightly clearer, though poor expiratory volume and breath support leads to breathy dysphonic speech. Minimal throat clearing observed with first sips of thin liquids, tolerated the rast of a cup of water and 6 oz of diet coke. Intermittent belching occurred during effortful expiration. Advised caregiver on basic esophageal precautions, particularly upright posture during and after meals. Will advance diet to mechanical soft with thin liquids. Pt likely to d/c tomorrow, No SLP f/u needed after d/c, but will follow as long as admitted.   HPI HPI: 84 year old with OSA/O chest, acute on chronic hypercarbic respiratory failure noncompliant with BiPAP. Intubated from 2/8 to 2/13. Plan is for home with hospice with a focus on comfort, no further BiPAP at home.       SLP Plan  Continue with current plan of care       Recommendations  Diet recommendations: Dysphagia 3 (mechanical soft);Thin liquid Liquids provided via: Cup;Straw Medication Administration: Whole meds with liquid Supervision: Trained caregiver to feed patient Compensations: Slow rate;Small sips/bites Postural Changes and/or Swallow Maneuvers: Upright 30-60 min after meal;Seated upright 90 degrees                Oral Care Recommendations: Oral care BID Follow up Recommendations: 24 hour supervision/assistance SLP Visit Diagnosis: Dysphagia, oropharyngeal phase (R13.12) Plan: Continue with current plan of care       [place] Isella Slatten, MA CCC-SLP [phone]  Lynann Beaver 07/14/2017, 3:10 PM

## 2017-07-14 NOTE — Care Management Note (Addendum)
Case Management Note  Patient Details  Name: Makayla Rodriguez MRN: [id number] Date of Birth: [birth year]  Subjective/Objective:    Acute on Chronic Resp Failure with Hypercapnia             Action/Plan: Patient lives at home with 24 hr care  Privately paid by family members/ [name] (Sister [phone])); has private insurance with Medicare/ AARP with prescription drug coverage; CM talked to family members, they are requesting home with Hospice care. Noted Palliative Care consult placed; CM will await for outcome of meeting. CM will continue to follow for progression of care. Mindi Slicker RN  1:57 pm - Family member chose Hospice and [facility]; referral made as requested; B Pennie Rushing  Expected Discharge Date:    possibly 07/17/2017              Expected Discharge Plan:  Home w Hospice Care  In-House Referral:   Palliative Care  Discharge planning Services  CM Consult  Post Acute Care Choice:    Choice offered to:  Sibling  Status of Service:  In process, will continue to follow  Sherrilyn Rist [phone] 07/14/2017, 10:30 AM

## 2017-07-15 DIAGNOSIS — E1122 Type 2 diabetes mellitus with diabetic chronic kidney disease: Secondary | ICD-10-CM

## 2017-07-15 DIAGNOSIS — R410 Disorientation, unspecified: Secondary | ICD-10-CM

## 2017-07-15 DIAGNOSIS — Z794 Long term (current) use of insulin: Secondary | ICD-10-CM

## 2017-07-15 DIAGNOSIS — I10 Essential (primary) hypertension: Secondary | ICD-10-CM

## 2017-07-15 LAB — GLUCOSE, CAPILLARY
Glucose-Capillary: 108 mg/dL — ABNORMAL HIGH (ref 65–99)
Glucose-Capillary: 109 mg/dL — ABNORMAL HIGH (ref 65–99)
Glucose-Capillary: 145 mg/dL — ABNORMAL HIGH (ref 65–99)
Glucose-Capillary: 207 mg/dL — ABNORMAL HIGH (ref 65–99)
Glucose-Capillary: 77 mg/dL (ref 65–99)
Glucose-Capillary: 87 mg/dL (ref 65–99)

## 2017-07-15 NOTE — Progress Notes (Signed)
[facility]  Continue to follow to coordinate admission to [facility] later today after patient arrives home. Spoke with sister Maximina who awaits call from [facility] for delivery of DME. Spoke to [place] who is coordinating delivery. [facility] RN plans to see patient in home later today. [place] to update when delivery takes place.   Thank you,  Erling Conte, LCSW [phone]

## 2017-07-15 NOTE — Discharge Summary (Signed)
Physician Discharge Summary  Teyanna Thielman HYW:[id number] DOB: 1932/04/16 DOA: 07/07/2017  PCP: Darcus Austin, MD  Admit date: 07/07/2017 Discharge date: 07/15/2017  Admitted From: Home Disposition:  Home with hospice  Recommendations for Outpatient Follow-up and new medication changes:  1. Follow up with PCP in 1- weeks 2. Patient is discharged home with hospice 3. Patient has declined further mechanical ventilation  Home Health: Yes  Equipment/Devices: Per home hospice   Discharge Condition: stable CODE STATUS: DNR  Diet recommendation:  Heart healthy  Brief/Interim Summary: 84 year old female who presented to the hospital with acute change in her mental status, positive somnolence, related to not using her BiPAP for several days.  Apparently significant leak and problems with her interface.  On the initial physical examination blood pressure 95/38, 87/33, heart rate 69, respiratory rate 24, oxygen saturation 88%.  Dry mucous membranes, her lungs shown coarse breath sounds bilaterally, no wheezing, heart S1-S2 present rhythmic, 3 out of 6 systolic murmur, no gallops rubs, abdomen protuberant, nontender, no lower extremity edema.  Sodium 144, potassium 4.9, chloride 90, bicarb 41, glucose 154, BUN 51, creatinine 0.93, arterial blood gas 7.25/ 112/ 133/ 47.9/ 98%, white cell count 10.8, hemoglobin 12.5, hematocrit 42.3, platelets 199.  Head CT negative for acute changes.  Chest x-ray with significant hypoinflation, left rotation, right base infiltrate.  EKG with normal sinus rhythm, normal intervals, normal axis.   Patient was admitted to the hospital can diagnosis acute hypercapnic respiratory failure, complicated by community-acquired pneumonia and pulmonary edema.  Patient failed a trail of diuretics, antibiotics and noninvasive mechanical ventilation in the emergency department, she was intubated, placed on invasive mechanical ventilation and transferred to the intensive care unit.  1.   Acute on chronic hypoxic/hypercapnic respiratory failure due to COPD exacerbation and decompensation of obesity hypoventilation syndrome.  Patient was admitted to the intensive care unit, continue support with invasive mechanical ventilation.  Respiratory failure deemed to be due to acute decompensation of COPD, and obesity hypoventilation syndrome.  Antibiotics were discontinued.  Patient underwent spontaneous breathing trial on February 13 and she was successfully extubated and transitioned to nasal cannula, 4 L/min.  Patient poor prognosis high risk of recurrent respiratory failure, patient has been declining use of BiPAP.  Goals of care were addressed, patient and her family have decided to continue care under hospice services.  Her CODE STATUS was changed to DNR.  Palliative care team was consulted.  On the day of her discharge she is awake and alert.  CT angiography was negative for pulmonary embolism. Resume bronchodilator therapy per home regimen.   2.  Hypertension.  Blood pressure remained well controlled patient has been off antihypertensive agents. Will resume Nebivolol at discharge.   3.  Acute on chronic diastolic heart failure.  Patient received diuresis with furosemide, negative fluid balance was achieved.  She will continue diuresis at home with torsemide per her home regimen.  Echocardiography showed LV systolic function of 48-54% with a pattern of severe left ventricular hypertrophy.  Peak PA pressures 44 mmHg, suggesting pulmonary hypertension, likely related to her obesity/obstructive sleep apnea. Continue Nebivolol.   4.  Type 2 diabetes mellitus.  Patient was placed on insulin sliding scale for glucose coverage monitor, capillary glucose remained well controlled.Will resume home regimen of insulin with lantus (10 units) and sliding scale.   5.  Chronic kidney disease stage III.  Her kidney function remains stable, tolerated diuresis well, discharge creatinine 1.18.  Continue diuresis  with torsemide.   6.  Morbid obesity.  Calculated BMI of 50.  Continue palliative care services.  Discharge Diagnoses:  Principal Problem:   Acute on chronic respiratory failure with hypercapnia (HCC) Active Problems:   Essential hypertension   Morbid obesity (HCC)   OSA (obstructive sleep apnea)   Obesity hypoventilation syndrome (HCC)   Acute exacerbation of CHF (congestive heart failure) (HCC)   Type II diabetes mellitus with renal manifestations (HCC)   HLD (hyperlipidemia)   CKD (chronic kidney disease), stage III (HCC)   HCAP (healthcare-associated pneumonia)   Respiratory failure (HCC)   Goals of care, counseling/discussion   Encounter for hospice care discussion    Discharge Instructions   Allergies as of 07/15/2017      Reactions   Demerol [meperidine] Anaphylaxis   Reaction: Hypoventilation after surgery   Penicillins Anaphylaxis, Swelling   Has patient had a PCN reaction causing immediate rash, facial/tongue/throat swelling, SOB or lightheadedness with hypotension: Yes Has patient had a PCN reaction causing severe rash involving mucus membranes or skin necrosis: No Has patient had a PCN reaction that required hospitalization: Yes Has patient had a PCN reaction occurring within the last 10 years: No If all of the above answers are "NO", then may proceed with Cephalosporin use.   Phenergan [promethazine Hcl] Anaphylaxis   Reaction: hypoventilation   Ativan [lorazepam] Other (See Comments)   Stopped breathing   Codeine Nausea And Vomiting   Macrobid [nitrofurantoin] Nausea And Vomiting   Relafen [nabumetone] Itching, Swelling   Sulfa Antibiotics Other (See Comments)   Unknown allergic reaction per sister      Medication List    TAKE these medications   acetaminophen 500 MG tablet Commonly known as:  TYLENOL Take 500 mg by mouth every 6 (six) hours as needed for mild pain.   albuterol 108 (90 Base) MCG/ACT inhaler Commonly known as:  PROVENTIL HFA;VENTOLIN  HFA Inhale 2 puffs into the lungs every 6 (six) hours as needed for wheezing or shortness of breath.   aspirin EC 81 MG tablet Take 81 mg by mouth daily.   atorvastatin 10 MG tablet Commonly known as:  LIPITOR Take 10 mg by mouth at bedtime. Reported on 05/14/2015   azelastine 0.1 % nasal spray Commonly known as:  ASTELIN Place 2 sprays into both nostrils 2 (two) times daily. Use in each nostril as directed   cetirizine 10 MG tablet Commonly known as:  ZYRTEC Take 10 mg by mouth daily. Reported on 05/14/2015   cholecalciferol 1000 units tablet Commonly known as:  VITAMIN D Take 1,000 Units by mouth daily. Reported on 05/14/2015   CINNAMON PO Take 1 capsule by mouth at bedtime.   diclofenac sodium 1 % Gel Commonly known as:  VOLTAREN Apply 4 g topically 2 (two) times daily as needed (for joint pain).   fluticasone 50 MCG/ACT nasal spray Commonly known as:  FLONASE Place 2 sprays into the nose at bedtime.   guaiFENesin 600 MG 12 hr tablet Commonly known as:  MUCINEX Take 1 tablet (600 mg total) by mouth 2 (two) times daily. What changed:    how much to take  when to take this   HUMALOG 100 UNIT/ML injection Generic drug:  insulin lispro Inject 2-10 Units into the skin 2 (two) times daily before lunch and supper. Per sliding scale: 150-200 2 units, 201-250, 4 units, 251-300 8 units, 301-350 10 units, >350 call doctor   hydroxypropyl methylcellulose / hypromellose 2.5 % ophthalmic solution Commonly known as:  ISOPTO TEARS / GONIOVISC Place 1 drop into both eyes  3 (three) times daily as needed (for dry eyes).   ipratropium-albuterol 0.5-2.5 (3) MG/3ML Soln Commonly known as:  DUONEB Take 3 mLs by nebulization 3 (three) times daily. What changed:    when to take this  reasons to take this   LANTUS 100 UNIT/ML injection Generic drug:  insulin glargine Inject 10 Units into the skin at bedtime.   magnesium hydroxide 400 MG/5ML suspension Commonly known as:  MILK  OF MAGNESIA Take 15 mLs by mouth daily as needed for mild constipation.   nebivolol 5 MG tablet Commonly known as:  BYSTOLIC Take 5 mg by mouth daily.   OMEGA 3 PO Take 1 capsule by mouth daily.   pantoprazole 40 MG tablet Commonly known as:  PROTONIX Take 40 mg by mouth daily.   senna 8.6 MG Tabs tablet Commonly known as:  SENOKOT Take 2 tablets by mouth at bedtime.   sertraline 50 MG tablet Commonly known as:  ZOLOFT Take 100 mg by mouth daily.   sodium chloride 0.65 % Soln nasal spray Commonly known as:  OCEAN Place 1 spray into both nostrils as needed for congestion.   torsemide 20 MG tablet Commonly known as:  DEMADEX Take 3 tablets (60 mg total) by mouth daily. What changed:  how much to take   TOZAL PO Take 1-2 tablets by mouth See admin instructions. Take 2 tablets by mouth every morning and 1 tablet at night (eye vitamin)   traMADol 50 MG tablet Commonly known as:  ULTRAM Take 1 tablet (50 mg total) by mouth every 12 (twelve) hours as needed (for pain.). What changed:  reasons to take this       Allergies  Allergen Reactions  . Demerol [Meperidine] Anaphylaxis    Reaction: Hypoventilation after surgery  . Penicillins Anaphylaxis and Swelling    Has patient had a PCN reaction causing immediate rash, facial/tongue/throat swelling, SOB or lightheadedness with hypotension: Yes Has patient had a PCN reaction causing severe rash involving mucus membranes or skin necrosis: No Has patient had a PCN reaction that required hospitalization: Yes Has patient had a PCN reaction occurring within the last 10 years: No If all of the above answers are "NO", then may proceed with Cephalosporin use.   Marland Kitchen Phenergan [Promethazine Hcl] Anaphylaxis    Reaction: hypoventilation  . Ativan [Lorazepam] Other (See Comments)    Stopped breathing  . Codeine Nausea And Vomiting  . Macrobid [Nitrofurantoin] Nausea And Vomiting  . Relafen [Nabumetone] Itching and Swelling  . Sulfa  Antibiotics Other (See Comments)    Unknown allergic reaction per sister    Consultations:  Palliative Care   Procedures/Studies: Dg Chest 1 View  Result Date: 07/07/2017 CLINICAL DATA:  SOB. Hx of CHF, diabetes, hypertension, sleep apnea, obesity hypoventilation syndrome. Former [phone]). EXAM: CHEST 1 VIEW COMPARISON:  Chest x-rays dated 06/14/2017, 03/18/2017 and 12/20/2016. FINDINGS: Mild cardiomegaly is stable. Overall cardiomediastinal silhouette is stable in size and configuration. Coarse interstitial markings noted bilaterally suggesting interstitial edema. More confluent opacity noted at the lateral aspects of the right lower lung most likely pneumonia or confluent pulmonary edema. Osseous structures about the chest are unremarkable. IMPRESSION: 1. Bilateral interstitial prominence, most likely interstitial edema related to volume overload/CHF. 2. Confluent opacity at the lateral aspects of the right lower lung, pneumonia versus confluent pulmonary edema. Recommend follow-up chest x-rays to ensure resolution. Electronically Signed   By: Franki Cabot M.D.   On: 07/07/2017 13:17   Ct Head Wo Contrast  Result Date: 07/07/2017 CLINICAL  DATA:  Altered level of consciousness. EXAM: CT HEAD WITHOUT CONTRAST TECHNIQUE: Contiguous axial images were obtained from the base of the skull through the vertex without intravenous contrast. COMPARISON:  None. FINDINGS: Brain: There is no evidence of acute infarct, intracranial hemorrhage, mass, midline shift, or extra-axial fluid collection. Mild cerebral atrophy is most notable in the parietal regions. Patchy deep cerebral white matter hypodensities are nonspecific but compatible with mild chronic small vessel ischemic disease. Vascular: Calcified atherosclerosis at the skull base. No hyperdense vessel. Skull: No fracture or focal osseous lesion. Sinuses/Orbits: Paranasal sinuses and mastoid air cells are clear. Bilateral cataract extraction. Other: None.  IMPRESSION: 1. No evidence of acute intracranial abnormality. 2. Mild chronic small vessel ischemic disease. Electronically Signed   By: Logan Bores M.D.   On: 07/07/2017 14:51   Ct Angio Chest Pe W Or Wo Contrast  Result Date: 07/11/2017 CLINICAL DATA:  Chronic respiratory failure. Intubation for recent pneumonia. Evaluate for pulmonary embolism. EXAM: CT ANGIOGRAPHY CHEST WITH CONTRAST TECHNIQUE: Multidetector CT imaging of the chest was performed using the standard protocol during bolus administration of intravenous contrast. Multiplanar CT image reconstructions and MIPs were obtained to evaluate the vascular anatomy. CONTRAST:  4mL ISOVUE-370 IOPAMIDOL (ISOVUE-370) INJECTION 76% COMPARISON:  Radiographs 07/10/2017.  CT 01/15/2015. FINDINGS: Cardiovascular: The pulmonary arteries are well opacified with contrast to the level of the subsegmental branches. There is no evidence of acute pulmonary embolism. There atherosclerosis of the aorta, great vessels and coronary arteries. No acute systemic arterial abnormalities are seen. Aortic valvular and mitral annular calcifications are present. The heart size is normal. There is no pericardial effusion. Mediastinum/Nodes: There are no enlarged mediastinal, hilar or axillary lymph nodes.Enteric tube extends into the stomach. The thyroid gland, trachea and esophagus demonstrate no significant findings. Lungs/Pleura: There is no pleural effusion or pneumothorax. Pleural based soft tissue mass at the left costophrenic angle is similar to multiple prior studies, measuring approximately 5.4 x 3.4 cm on image 98 of series 5. There is dependent airspace disease in the right lower lobe with associated air bronchograms. This likely represents atelectasis, although aspiration pneumonia could have this appearance. Milder dependent left lower lobe opacity is probably atelectasis. Minimal patchy ground-glass opacities are present elsewhere throughout the lungs, superimposed on  mild emphysema. There is a calcified right upper lobe granuloma. No suspicious pulmonary nodules. Upper abdomen: The visualized upper abdomen appears stable without acute findings. There is a stable small right adrenal adenoma and a stable small low-density lesion posteriorly in the right hepatic lobe. Musculoskeletal/Chest wall: There is no chest wall mass or suspicious osseous finding. Review of the MIP images confirms the above findings. IMPRESSION: 1. No evidence of acute pulmonary embolism or other acute vascular findings. 2. Dependent right-greater-than-left airspace opacities with air bronchograms, probably atelectasis. Aspiration in the right lower lobe is difficult to exclude. 3. No evidence of suspicious pulmonary nodule or mediastinal lymphadenopathy. 4. Stable pleural based mass at the left costophrenic angle consistent with a benign finding based on stability. Electronically Signed   By: Richardean Sale M.D.   On: 07/11/2017 12:59   Dg Chest Port 1 View  Result Date: 07/10/2017 CLINICAL DATA:  Intubated, respiratory failure EXAM: PORTABLE CHEST 1 VIEW COMPARISON:  07/09/2017 FINDINGS: Endotracheal tube 2.7 cm above the carina. NG tube within the stomach with the tip not visualized. Increased bibasilar atelectasis obscuring the hemidiaphragms. Small pleural effusions not excluded. No large pneumothorax. Upper lobes remain clear. Heart is enlarged. Degenerative changes noted of the spine. Remote  cholecystectomy. IMPRESSION: Lower lung volumes with increased bibasilar atelectasis. Query small effusions developing. Stable support apparatus Electronically Signed   By: Jerilynn Mages.  Shick M.D.   On: 07/10/2017 07:33   Dg Chest Port 1 View  Result Date: 07/09/2017 CLINICAL DATA:  Respiratory failure. EXAM: PORTABLE CHEST 1 VIEW COMPARISON:  07/07/2017 and prior exams FINDINGS: An endotracheal tube with tip 3 cm above the carina and NG tube entering the stomach with tip off the field of view again noted. The  cardiomediastinal silhouette is unchanged in this mildly low volume film. Mild basilar atelectasis and mild pulmonary vascular congestion again noted. There is no evidence of pneumothorax. IMPRESSION: Unchanged appearance the chest with mild pulmonary vascular congestion and mild bibasilar atelectasis. Electronically Signed   By: Margarette Canada M.D.   On: 07/09/2017 06:42   Dg Chest Portable 1 View  Result Date: 07/07/2017 CLINICAL DATA:  Intubation EXAM: PORTABLE CHEST 1 VIEW COMPARISON:  Portable exam 1721 hours compared to 1305 hours FINDINGS: Tip of endotracheal tube projects 3.3 cm above carina. Stable heart size and mediastinal contours. Atherosclerotic calcification aorta. Bibasilar atelectasis. Mild BILATERAL perihilar infiltrates likely representing pulmonary edema, greater on LEFT. No pneumothorax. IMPRESSION: Mild pulmonary edema with bibasilar atelectasis. Tip of endotracheal tube projects 3.3 cm above carina. Electronically Signed   By: Lavonia Dana M.D.   On: 07/07/2017 17:50   Dg Abd Portable 1 View  Result Date: 07/07/2017 CLINICAL DATA:  Evaluate OG tube EXAM: PORTABLE ABDOMEN - 1 VIEW COMPARISON:  None. FINDINGS: The OG tube terminates in left upper quadrant. IMPRESSION: The OG tube terminates in the left upper quadrant. Electronically Signed   By: Dorise Bullion III M.D   On: 07/07/2017 19:10       Subjective: Patient is feeling well, no dyspnea or chest pain, no nausea or vomiting.   Discharge Exam: Vitals:   07/14/17 2210 07/15/17 0530  BP: (!) 119/51 (!) 139/58  Pulse: 60 64  Resp: 18 18  Temp: 98.6 F (37 C) 98.2 F (36.8 C)  SpO2: 100% 100%   Vitals:   07/14/17 1508 07/14/17 1512 07/14/17 2210 07/15/17 0530  BP: (!) 144/95  (!) 119/51 (!) 139/58  Pulse: 93  60 64  Resp:   18 18  Temp: 98.4 F (36.9 C)  98.6 F (37 C) 98.2 F (36.8 C)  TempSrc: Axillary  Oral Oral  SpO2: 100% 100% 100% 100%  Weight:      Height:        General: obese, deconditioned and ill  looking.  Neurology: Awake and alert, non focal  E ENT: mild pallor, no icterus, oral mucosa moist Cardiovascular: No JVD. S1-S2 present, rhythmic, no gallops, rubs, or murmurs. Non pitting lower extremity edema. Pulmonary: decreased breath sounds bilaterally, poor air movement, no wheezing, rhonchi or rales. Gastrointestinal. Abdomen protuberant, no organomegaly, non tender, no rebound or guarding Skin. No rashes Musculoskeletal: no joint deformities   The results of significant diagnostics from this hospitalization (including imaging, microbiology, ancillary and laboratory) are listed below for reference.     Microbiology: Recent Results (from the past 240 hour(s))  Culture, blood (routine x 2) Call MD if unable to obtain prior to antibiotics being given     Status: None   Collection Time: 07/07/17  2:15 PM  Result Value Ref Range Status   Specimen Description BLOOD RIGHT WRIST  Final   Special Requests   Final    BOTTLES DRAWN AEROBIC AND ANAEROBIC Blood Culture adequate volume   Culture  Final    NO GROWTH 5 DAYS Performed at [facility], [place] [address] [place], [place] [address]    Report Status 07/12/2017 FINAL  Final  Culture, blood (routine x 2) Call MD if unable to obtain prior to antibiotics being given     Status: None   Collection Time: 07/07/17  3:38 PM  Result Value Ref Range Status   Specimen Description BLOOD LEFT ANTECUBITAL  Final   Special Requests   Final    BOTTLES DRAWN AEROBIC AND ANAEROBIC Blood Culture adequate volume   Culture   Final    NO GROWTH 5 DAYS Performed at [facility], [place] [address] [place], [place] [address]    Report Status 07/12/2017 FINAL  Final  MRSA PCR Screening     Status: None   Collection Time: 07/07/17  5:55 PM  Result Value Ref Range Status   MRSA by PCR NEGATIVE NEGATIVE Final    Comment:        The GeneXpert MRSA Assay (FDA approved for NASAL specimens only), is one component of a comprehensive MRSA  colonization surveillance program. It is not intended to diagnose MRSA infection nor to guide or monitor treatment for MRSA infections. Performed at [facility], [place] [address], [place], [place] [address]   Culture, respiratory (NON-Expectorated)     Status: None   Collection Time: 07/08/17  3:30 AM  Result Value Ref Range Status   Specimen Description TRACHEAL ASPIRATE  Final   Special Requests NONE  Final   Gram Stain   Final    FEW WBC PRESENT, PREDOMINANTLY PMN RARE GRAM POSITIVE COCCI    Culture   Final    Consistent with normal respiratory flora. Performed at [facility], [place] [address], [place], [place] [address]    Report Status 07/10/2017 FINAL  Final  Respiratory Panel by PCR     Status: None   Collection Time: 07/08/17 10:25 AM  Result Value Ref Range Status   Adenovirus NOT DETECTED NOT DETECTED Final   Coronavirus 229E NOT DETECTED NOT DETECTED Final   Coronavirus HKU1 NOT DETECTED NOT DETECTED Final   Coronavirus NL63 NOT DETECTED NOT DETECTED Final   Coronavirus OC43 NOT DETECTED NOT DETECTED Final   Metapneumovirus NOT DETECTED NOT DETECTED Final   Rhinovirus / Enterovirus NOT DETECTED NOT DETECTED Final   Influenza A NOT DETECTED NOT DETECTED Final   Influenza A H1 NOT DETECTED NOT DETECTED Final   Influenza A H1 2009 NOT DETECTED NOT DETECTED Final   Influenza A H3 NOT DETECTED NOT DETECTED Final   Influenza B NOT DETECTED NOT DETECTED Final   Parainfluenza Virus 1 NOT DETECTED NOT DETECTED Final   Parainfluenza Virus 2 NOT DETECTED NOT DETECTED Final   Parainfluenza Virus 3 NOT DETECTED NOT DETECTED Final   Parainfluenza Virus 4 NOT DETECTED NOT DETECTED Final   Respiratory Syncytial Virus NOT DETECTED NOT DETECTED Final   Bordetella pertussis NOT DETECTED NOT DETECTED Final   Chlamydophila pneumoniae NOT DETECTED NOT DETECTED Final   Mycoplasma pneumoniae NOT DETECTED NOT DETECTED Final    Comment: Performed at [facility],  [place] [address], [place], [place] [address]     Labs: BNP (last 3 results) Recent Labs    06/02/17 1931 07/07/17 1224 07/11/17 0859  BNP 259.3* 463.4* 540.0*   Basic Metabolic Panel: Recent Labs  Lab 07/09/17 0446 07/10/17 0419 07/11/17 0435 07/11/17 0859 07/12/17 0351  NA 142 144 146* 148*  148*  K 3.0* 4.2 3.9 5.1 3.6  CL 92* 97* 99* 101 102  CO2 34* 33* 32 29 33*  GLUCOSE 204* 227* 233* 210* 200*  BUN 51* 53* 56* 60* 56*  CREATININE 1.03* 1.20* 1.19* 1.24* 1.18*  CALCIUM 9.0 9.1 9.2 9.6 9.3  MG 2.2 2.5* 2.5*  --   --   PHOS 3.2 2.7 3.2  --   --    Liver Function Tests: Recent Labs  Lab 07/11/17 0859  AST 26  ALT 13*  ALKPHOS 40  BILITOT 0.7  PROT 6.3*  ALBUMIN 2.4*   No results for input(s): LIPASE, AMYLASE in the last 168 hours. No results for input(s): AMMONIA in the last 168 hours. CBC: Recent Labs  Lab 07/09/17 0446 07/10/17 0419  WBC 10.4 11.3*  HGB 10.9* 11.0*  HCT 35.6* 35.6*  MCV 93.9 93.7  PLT 182 184   Cardiac Enzymes: No results for input(s): CKTOTAL, CKMB, CKMBINDEX, TROPONINI in the last 168 hours. BNP: Invalid input(s): POCBNP CBG: Recent Labs  Lab 07/14/17 1557 07/14/17 2052 07/15/17 0009 07/15/17 0443 07/15/17 0809  GLUCAP 134* 117* 109* 87 77   D-Dimer No results for input(s): DDIMER in the last 72 hours. Hgb A1c No results for input(s): HGBA1C in the last 72 hours. Lipid Profile No results for input(s): CHOL, HDL, LDLCALC, TRIG, CHOLHDL, LDLDIRECT in the last 72 hours. Thyroid function studies No results for input(s): TSH, T4TOTAL, T3FREE, THYROIDAB in the last 72 hours.  Invalid input(s): FREET3 Anemia work up No results for input(s): VITAMINB12, FOLATE, FERRITIN, TIBC, IRON, RETICCTPCT in the last 72 hours. Urinalysis    Component Value Date/Time   COLORURINE YELLOW 06/04/2017 [place] 06/04/2017 0943   LABSPEC 1.011 06/04/2017 0943   PHURINE 5.0 06/04/2017 0943   GLUCOSEU NEGATIVE 06/04/2017 0943    HGBUR NEGATIVE 06/04/2017 0943   BILIRUBINUR NEGATIVE 06/04/2017 0943   KETONESUR NEGATIVE 06/04/2017 0943   PROTEINUR NEGATIVE 06/04/2017 0943   UROBILINOGEN 0.2 04/12/2014 1738   NITRITE POSITIVE (A) 06/04/2017 0943   LEUKOCYTESUR SMALL (A) 06/04/2017 0943   Sepsis Labs Invalid input(s): PROCALCITONIN,  WBC,  LACTICIDVEN Microbiology Recent Results (from the past 240 hour(s))  Culture, blood (routine x 2) Call MD if unable to obtain prior to antibiotics being given     Status: None   Collection Time: 07/07/17  2:15 PM  Result Value Ref Range Status   Specimen Description BLOOD RIGHT WRIST  Final   Special Requests   Final    BOTTLES DRAWN AEROBIC AND ANAEROBIC Blood Culture adequate volume   Culture   Final    NO GROWTH 5 DAYS Performed at [facility], [place] [address] [place], [place] [address]    Report Status 07/12/2017 FINAL  Final  Culture, blood (routine x 2) Call MD if unable to obtain prior to antibiotics being given     Status: None   Collection Time: 07/07/17  3:38 PM  Result Value Ref Range Status   Specimen Description BLOOD LEFT ANTECUBITAL  Final   Special Requests   Final    BOTTLES DRAWN AEROBIC AND ANAEROBIC Blood Culture adequate volume   Culture   Final    NO GROWTH 5 DAYS Performed at [facility], [place] [address] [place], [place] [address]    Report Status 07/12/2017 FINAL  Final  MRSA PCR Screening     Status: None   Collection Time: 07/07/17  5:55 PM  Result Value Ref Range  Status   MRSA by PCR NEGATIVE NEGATIVE Final    Comment:        The GeneXpert MRSA Assay (FDA approved for NASAL specimens only), is one component of a comprehensive MRSA colonization surveillance program. It is not intended to diagnose MRSA infection nor to guide or monitor treatment for MRSA infections. Performed at [facility], [place] [address] [place], [place] [address]   Culture, respiratory (NON-Expectorated)     Status: None   Collection Time:  07/08/17  3:30 AM  Result Value Ref Range Status   Specimen Description TRACHEAL ASPIRATE  Final   Special Requests NONE  Final   Gram Stain   Final    FEW WBC PRESENT, PREDOMINANTLY PMN RARE GRAM POSITIVE COCCI    Culture   Final    Consistent with normal respiratory flora. Performed at [facility], [place] [address] [place], [place] [address]    Report Status 07/10/2017 FINAL  Final  Respiratory Panel by PCR     Status: None   Collection Time: 07/08/17 10:25 AM  Result Value Ref Range Status   Adenovirus NOT DETECTED NOT DETECTED Final   Coronavirus 229E NOT DETECTED NOT DETECTED Final   Coronavirus HKU1 NOT DETECTED NOT DETECTED Final   Coronavirus NL63 NOT DETECTED NOT DETECTED Final   Coronavirus OC43 NOT DETECTED NOT DETECTED Final   Metapneumovirus NOT DETECTED NOT DETECTED Final   Rhinovirus / Enterovirus NOT DETECTED NOT DETECTED Final   Influenza A NOT DETECTED NOT DETECTED Final   Influenza A H1 NOT DETECTED NOT DETECTED Final   Influenza A H1 2009 NOT DETECTED NOT DETECTED Final   Influenza A H3 NOT DETECTED NOT DETECTED Final   Influenza B NOT DETECTED NOT DETECTED Final   Parainfluenza Virus 1 NOT DETECTED NOT DETECTED Final   Parainfluenza Virus 2 NOT DETECTED NOT DETECTED Final   Parainfluenza Virus 3 NOT DETECTED NOT DETECTED Final   Parainfluenza Virus 4 NOT DETECTED NOT DETECTED Final   Respiratory Syncytial Virus NOT DETECTED NOT DETECTED Final   Bordetella pertussis NOT DETECTED NOT DETECTED Final   Chlamydophila pneumoniae NOT DETECTED NOT DETECTED Final   Mycoplasma pneumoniae NOT DETECTED NOT DETECTED Final    Comment: Performed at [facility], [place] [address] [place],  [address]     Time coordinating discharge: 45 minutes  SIGNED:   Tawni Millers, MD  [facility] 07/15/2017, 10:16 AM Pager [phone]  If 7PM-7AM, please contact night-coverage [email or web address] Password [email or web address]

## 2017-07-15 NOTE — Care Management Note (Addendum)
Case Management Note  Patient Details  Name: Makayla Rodriguez MRN: [id number] Date of Birth: 1931/09/01  Subjective/Objective:     Presented with acute hypercapnic respiratory failure, complicated by community-acquired pneumonia and pulmonary edema, history of heart failure, obesity hypoventilation syndrome with the use of oxygen, high blood pressure, sleep apnea with the use of CPAP, GERD, diabetes, stage III kidney disease, arthritis, anxiety.   [name] (Sister)     [phone]      Action/Plan:  Transition to home with home hospice care. Pt will need transportation to home provided by [facility] called and arranged once delivery of DME has arrived to pt's home. NCM made pt aware.    Expected Discharge Date:  07/15/17               Expected Discharge Plan:  Home w Hospice Care  In-House Referral:     Discharge planning Services  CM Consult  Post Acute Care Choice:    Choice offered to:  Sibling  DME Arranged:    [facility], arranging home equipment  DME Agency:     HH Arranged:    [place] Agency:  [facility]  Status of Service:  Completed, signed off  If discussed at [place] of Stay Meetings, dates discussed:    Additional Comments:  [name], RN 07/15/2017, 11:04 AM

## 2017-07-15 NOTE — Progress Notes (Signed)
NCM learned DME delivery will be late evening arriving to pt's home. Sister unable to accept and care for pt so late in the evening. States will be ready to receive pt in the am. NCM made  MD and charge nurse aware. Charge nurse to f/u in am with  Watt Climes (sister)  @ [phone] regarding DME delivery and arrange transportation to home with [facility] if delivery is in place. [place] Hero RN,BSN,CM

## 2017-07-16 LAB — GLUCOSE, CAPILLARY
Glucose-Capillary: 108 mg/dL — ABNORMAL HIGH (ref 65–99)
Glucose-Capillary: 136 mg/dL — ABNORMAL HIGH (ref 65–99)
Glucose-Capillary: 166 mg/dL — ABNORMAL HIGH (ref 65–99)
Glucose-Capillary: 197 mg/dL — ABNORMAL HIGH (ref 65–99)
Glucose-Capillary: 213 mg/dL — ABNORMAL HIGH (ref 65–99)

## 2017-07-16 NOTE — Progress Notes (Signed)
Notified by Erling Conte with [facility]- DME has been delivered to home and sister is ready for pt transport home. [facility] call for transport, paperwork on shadow chart. Notified bedside RN on 5W that transport has been called for pt to d/c home.

## 2017-07-16 NOTE — Progress Notes (Signed)
[facility]  Received follow up call from patient's sister who is coordinating delivery of DME into home. Per Watt Climes, [facility] is scheduled to deliver DME between 11 am and 3 pm today. Madeline to update when DME is in home. [facility] RN to see patient at home after discharge.   Thank you,  Erling Conte, LCSW [phone]

## 2017-08-29 NOTE — Telephone Encounter (Signed)
Close  

## 2017-10-26 NOTE — Progress Notes (Deleted)
@Patient  ID: Makayla Rodriguez, female    DOB: Aug 26, 1931, 84 y.o.   MRN: [id number]  No chief complaint on file.   Referring provider: Darcus Austin, MD  HPI: 84 y.o. female former smoker (quit 1982 with a 20-pack-year smoking history) with a history of HTN, HLD, CHF, OSA, OHS,  chronic hypoxemic respiratory failure on 2-3 L O2. She is followed by Dr. Elsworth Soho.  Recent [facility] Encounters:   07/06/2017 OV for oxygen qualification. Pt. Presents to the office in wheel chair on oxygen  for oxygen qualification. She states she is compliant with her BiPAP  every night.  Her caregiver confirms this however states that the patient does have issues with mask leak at night.  We have had the patient bring her machine today so we can obtain a Down Load.  Patient states she is compliant with her duo nebs however is only using it twice daily instead of 3 times daily.  She states she is compliant with her pro-air but rarely needs to use it.  She states she is compliant with her Flonase each night.  Per her caregiver who is here today with her she is nonambulatory.  She wears her oxygen at 4 L nasal cannula at rest.  The patient was recently admitted to the hospital June 02, 2017 through June 06, 2017 for 3 days of somnolence and hypercarbic respiratory failure.  She was treated with DuoNeb, IV Lasix, and BiPAP.  She was admitted to a stepdown unit for ongoing evaluation.  Chronic hypercarbic respiratory failure was secondary to OSA/OHS and also possible acute on chronic diastolic heart failure.  She had an additional ER visit on June 14, 2017, for hypercapnic respiratory failure most likely secondary to noncompliance with her BiPAP at night.  She also had a UTI and was started on Keflex after this admission.  Patient denies fever, chest pain, orthopnea, or hemoptysis.  We discussed with the patient the importance of utilizing her BiPAP every night without fail.  We also discussed use of BiPAP during daily.   Patient continues to complain about her mask.  Per patient's caregivers she is nonambulatory and does not exert herself.  They are very aware of monitoring her for confusion as an indication of acute on chronic hypercarbic respiratory failure. Blood gases on admission to the hospital confirmed chronic hypercapnia.  Last admission 06/03/2017 patient was hypercapnic with a PCO2 of 79 despite BiPAP use. Patient will not survive without her BiPAP machine.  BiPAP is an absolute necessity to the patient's survival.  Test Results: Oxygen qualification 07/06/2017 Patient Saturations on Room Air  = 87% Patient Saturations on 4 Liters  94%  BiPAP download 06/06/2017-2/6/2-19 Air Curve 10 ST IPAP 12 cm H2O EPAP 6 cm H2O Usage days 30 out of 30 or 100% Greater than 4 hours 29 days were 97% Less than 4 hours 1 day or 3% AHI 10.7 Original AHI was 97 therefore this can be considered therapeutic.    Tests:   Oxygen qualification 07/06/2017- Patient Saturations on Room Air  = 87%, Patient Saturations on 4 Liters  94%  PSG 06/2012 >>AHI 98.7, SpO2 low 58%. 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, compared to 2013, Liver cyst/ adrenal benign nodule - unchanged two small 6 mm nodules in the right lower lobe  Cardiac:  ECHO 12/19/16 with mild RA dilation, no mention of [facility]   Chart Review:  07/07/2017-hospitalization-as hypercapnia/acute  on chronic respiratory failure >>>Patient was discharged on 07/16/2017 >>>Patient was discharged home with hospice >>>Patient has declined further mechanical ventilation >>>CODE STATUS changed to DNR >>>Patient failed a trial of diuretics, antibiotics, noninvasive mechanical ventilation, and was intubated and placed on mechanical ventilation and transferred to the intensive care unit    10/27/17 OV  Rev BiPap use, look at compliance    Allergies  Allergen Reactions  . Demerol [Meperidine]  Anaphylaxis    Reaction: Hypoventilation after surgery  . Penicillins Anaphylaxis and Swelling    Has patient had a PCN reaction causing immediate rash, facial/tongue/throat swelling, SOB or lightheadedness with hypotension: Yes Has patient had a PCN reaction causing severe rash involving mucus membranes or skin necrosis: No Has patient had a PCN reaction that required hospitalization: Yes Has patient had a PCN reaction occurring within the last 10 years: No If all of the above answers are "NO", then may proceed with Cephalosporin use.   Marland Kitchen Phenergan [Promethazine Hcl] Anaphylaxis    Reaction: hypoventilation  . Ativan [Lorazepam] Other (See Comments)    Stopped breathing  . Codeine Nausea And Vomiting  . Macrobid [Nitrofurantoin] Nausea And Vomiting  . Relafen [Nabumetone] Itching and Swelling  . Sulfa Antibiotics Other (See Comments)    Unknown allergic reaction per sister    Immunization History  Administered Date(s) Administered  . Influenza Split 02/28/2012, 02/27/2013  . Influenza, High Dose Seasonal PF 04/04/2016, 04/25/2017  . Influenza-Unspecified 02/10/2014, 02/11/2015    Past Medical History:  Diagnosis Date  . Anxiety   . Arthritis   . CHF (congestive heart failure) ([place])    12/13 admission  . CKD (chronic kidney disease), stage III ([place])   . Complication of anesthesia    O2 sat drop during  and went into coma  . Diabetes mellitus without complication ([place])   . GERD (gastroesophageal reflux disease)   . Hypertension   . Obesity   . Obesity hypoventilation syndrome ([place])    uses O2 at home  . Seasonal allergies   . Shortness of breath    with annxiety attack  . Sleep apnea    CPAP    Tobacco History: Social History   Tobacco Use  Smoking Status Former Smoker  . Packs/day: 0.50  . Years: 40.00  . Pack years: 20.00  . Types: Cigarettes  . Last attempt to quit: 05/30/1980  . Years since quitting: 37.4  Smokeless Tobacco Never Used   Counseling given:  Not Answered   Outpatient Encounter Medications as of 10/27/2017  Medication Sig  . acetaminophen (TYLENOL) 500 MG tablet Take 500 mg by mouth every 6 (six) hours as needed for mild pain.   Marland Kitchen albuterol (PROVENTIL HFA;VENTOLIN HFA) 108 (90 Base) MCG/ACT inhaler Inhale 2 puffs into the lungs every 6 (six) hours as needed for wheezing or shortness of breath.  Marland Kitchen aspirin EC 81 MG tablet Take 81 mg by mouth daily.   Marland Kitchen atorvastatin (LIPITOR) 10 MG tablet Take 10 mg by mouth at bedtime. Reported on 05/14/2015  . azelastine (ASTELIN) 0.1 % nasal spray Place 2 sprays into both nostrils 2 (two) times daily. Use in each nostril as directed  . cetirizine (ZYRTEC) 10 MG tablet Take 10 mg by mouth daily. Reported on 05/14/2015  . cholecalciferol (VITAMIN D) 1000 UNITS tablet Take 1,000 Units by mouth daily. Reported on 05/14/2015  . CINNAMON PO Take 1 capsule by mouth at bedtime.  . diclofenac sodium (VOLTAREN) 1 % GEL Apply 4 g topically 2 (two) times  daily as needed (for joint pain).   . Dietary Management Product (TOZAL PO) Take 1-2 tablets by mouth See admin instructions. Take 2 tablets by mouth every morning and 1 tablet at night (eye vitamin)  . fluticasone (FLONASE) 50 MCG/ACT nasal spray Place 2 sprays into the nose at bedtime.   Marland Kitchen guaiFENesin (MUCINEX) 600 MG 12 hr tablet Take 1 tablet (600 mg total) by mouth 2 (two) times daily. (Patient taking differently: Take 1,200 mg by mouth daily. )  . HUMALOG 100 UNIT/ML injection Inject 2-10 Units into the skin 2 (two) times daily before lunch and supper. Per sliding scale: 150-200 2 units, 201-250, 4 units, 251-300 8 units, 301-350 10 units, >350 call doctor  . hydroxypropyl methylcellulose (ISOPTO TEARS) 2.5 % ophthalmic solution Place 1 drop into both eyes 3 (three) times daily as needed (for dry eyes).  Marland Kitchen ipratropium-albuterol (DUONEB) 0.5-2.5 (3) MG/3ML SOLN Take 3 mLs by nebulization 3 (three) times daily. (Patient taking differently: Take 3 mLs by  nebulization 3 (three) times daily as needed (shortness of breath/wheezing). )  . LANTUS 100 UNIT/ML injection Inject 10 Units into the skin at bedtime.  . magnesium hydroxide (MILK OF MAGNESIA) 400 MG/5ML suspension Take 15 mLs by mouth daily as needed for mild constipation.  . nebivolol (BYSTOLIC) 5 MG tablet Take 5 mg by mouth daily.  . Omega-3 Fatty Acids (OMEGA 3 PO) Take 1 capsule by mouth daily.   . pantoprazole (PROTONIX) 40 MG tablet Take 40 mg by mouth daily.  Marland Kitchen senna (SENOKOT) 8.6 MG TABS tablet Take 2 tablets by mouth at bedtime.   . sertraline (ZOLOFT) 50 MG tablet Take 100 mg by mouth daily.  . sodium chloride (OCEAN) 0.65 % SOLN nasal spray Place 1 spray into both nostrils as needed for congestion.  . torsemide (DEMADEX) 20 MG tablet Take 3 tablets (60 mg total) by mouth daily. (Patient taking differently: Take 40 mg by mouth daily. )  . traMADol (ULTRAM) 50 MG tablet Take 1 tablet (50 mg total) by mouth every 12 (twelve) hours as needed (for pain.). (Patient taking differently: Take 50 mg by mouth every 12 (twelve) hours as needed for moderate pain. )   No facility-administered encounter medications on file as of 10/27/2017.      Review of Systems  Constitutional:   No  weight loss, night sweats,  fevers, chills, fatigue, or  lassitude HEENT:   No headaches,  Difficulty swallowing,  Tooth/dental problems, or  Sore throat, No sneezing, itching, ear ache, nasal congestion, post nasal drip  CV: No chest pain,  orthopnea, PND, swelling in lower extremities, anasarca, dizziness, palpitations, syncope  GI: No heartburn, indigestion, abdominal pain, nausea, vomiting, diarrhea, change in bowel habits, loss of appetite, bloody stools Resp: No shortness of breath with exertion or at rest.  No excess mucus, no productive cough,  No non-productive cough,  No coughing up of blood.  No change in color of mucus.  No wheezing.  No chest wall deformity Skin: no rash, lesions, no skin  changes. GU: no dysuria, change in color of urine, no urgency or frequency.  No flank pain, no hematuria  MS:  No joint pain or swelling.  No decreased range of motion.  No back pain. Psych:  No change in mood or affect. No depression or anxiety.  No memory loss.   Physical Exam  There were no vitals taken for this visit.  GEN: A/Ox3; pleasant , NAD, well nourished    HEENT:  [place]/AT,  EACs-clear, TMs-wnl, NOSE-clear, THROAT-clear, no lesions, no postnasal drip or exudate noted.   NECK:  Supple w/ fair ROM; no JVD; normal carotid impulses w/o bruits; no thyromegaly or nodules palpated; no lymphadenopathy.    RESP:  Clear  P & A; w/o, wheezes/ rales/ or rhonchi. no accessory muscle use, no dullness to percussion  CARD:  RRR, no m/r/g, no peripheral edema, pulses intact, no cyanosis or clubbing.  GI:   Soft & nt; nml bowel sounds; no organomegaly or masses detected.   Musco: Warm bil, no deformities or joint swelling noted.   Neuro: alert, no focal deficits noted.    Skin: Warm, no lesions or rashes    Lab Results:  CBC    Component Value Date/Time   WBC 11.3 (H) 07/10/2017 0419   RBC 3.80 (L) 07/10/2017 0419   HGB 11.0 (L) 07/10/2017 0419   HGB 12.4 09/18/2014 1246   HCT 35.6 (L) 07/10/2017 0419   HCT 39.7 09/18/2014 1246   PLT 184 07/10/2017 0419   PLT 177 09/18/2014 1246   MCV 93.7 07/10/2017 0419   MCV 96.6 09/18/2014 1246   MCH 28.9 07/10/2017 0419   MCHC 30.9 07/10/2017 0419   RDW 14.3 07/10/2017 0419   RDW 12.6 09/18/2014 1246   LYMPHSABS 1.5 07/07/2017 1322   LYMPHSABS 2.0 09/18/2014 1246   MONOABS 0.5 07/07/2017 1322   MONOABS 0.6 09/18/2014 1246   EOSABS 0.0 07/07/2017 1322   EOSABS 0.2 09/18/2014 1246   BASOSABS 0.0 07/07/2017 1322   BASOSABS 0.0 09/18/2014 1246    BMET    Component Value Date/Time   NA 148 (H) 07/12/2017 0351   NA 141 09/18/2014 1246   K 3.6 07/12/2017 0351   K 4.3 09/18/2014 1246   CL 102 07/12/2017 0351   CO2 33 (H)  07/12/2017 0351   CO2 36 (H) 09/18/2014 1246   GLUCOSE 200 (H) 07/12/2017 0351   GLUCOSE 261 (H) 09/18/2014 1246   BUN 56 (H) 07/12/2017 0351   BUN 23.8 09/18/2014 1246   CREATININE 1.18 (H) 07/12/2017 0351   CREATININE 0.8 09/18/2014 1246   CALCIUM 9.3 07/12/2017 0351   CALCIUM 9.2 09/18/2014 1246   GFRNONAA 41 (L) 07/12/2017 0351   GFRAA 48 (L) 07/12/2017 0351    BNP    Component Value Date/Time   BNP 161.6 (H) 07/11/2017 0859    ProBNP    Component Value Date/Time   PROBNP 976.0 (H) 04/15/2014 0410    Imaging: No results found.   Assessment & Plan:   No problem-specific Assessment & Plan notes found for this encounter.     Lauraine Rinne, NP 10/26/2017

## 2017-10-27 ENCOUNTER — Ambulatory Visit: Payer: Medicare Other | Admitting: Pulmonary Disease

## 2017-10-28 DIAGNOSIS — F339 Major depressive disorder, recurrent, unspecified: Secondary | ICD-10-CM | POA: Diagnosis not present

## 2017-10-28 DIAGNOSIS — K219 Gastro-esophageal reflux disease without esophagitis: Secondary | ICD-10-CM | POA: Diagnosis not present

## 2017-10-28 DIAGNOSIS — E669 Obesity, unspecified: Secondary | ICD-10-CM | POA: Diagnosis not present

## 2017-10-28 DIAGNOSIS — J9610 Chronic respiratory failure, unspecified whether with hypoxia or hypercapnia: Secondary | ICD-10-CM | POA: Diagnosis not present

## 2017-10-28 DIAGNOSIS — J301 Allergic rhinitis due to pollen: Secondary | ICD-10-CM | POA: Diagnosis not present

## 2017-10-28 DIAGNOSIS — J449 Chronic obstructive pulmonary disease, unspecified: Secondary | ICD-10-CM | POA: Diagnosis not present

## 2017-10-28 DIAGNOSIS — N189 Chronic kidney disease, unspecified: Secondary | ICD-10-CM | POA: Diagnosis not present

## 2017-10-28 DIAGNOSIS — I10 Essential (primary) hypertension: Secondary | ICD-10-CM | POA: Diagnosis not present

## 2017-10-28 DIAGNOSIS — I509 Heart failure, unspecified: Secondary | ICD-10-CM | POA: Diagnosis not present

## 2017-10-28 DIAGNOSIS — E1159 Type 2 diabetes mellitus with other circulatory complications: Secondary | ICD-10-CM | POA: Diagnosis not present

## 2017-10-30 DIAGNOSIS — J449 Chronic obstructive pulmonary disease, unspecified: Secondary | ICD-10-CM | POA: Diagnosis not present

## 2017-10-30 DIAGNOSIS — J9610 Chronic respiratory failure, unspecified whether with hypoxia or hypercapnia: Secondary | ICD-10-CM | POA: Diagnosis not present

## 2017-10-30 DIAGNOSIS — E669 Obesity, unspecified: Secondary | ICD-10-CM | POA: Diagnosis not present

## 2017-10-30 DIAGNOSIS — E1159 Type 2 diabetes mellitus with other circulatory complications: Secondary | ICD-10-CM | POA: Diagnosis not present

## 2017-10-30 DIAGNOSIS — I509 Heart failure, unspecified: Secondary | ICD-10-CM | POA: Diagnosis not present

## 2017-10-30 DIAGNOSIS — F339 Major depressive disorder, recurrent, unspecified: Secondary | ICD-10-CM | POA: Diagnosis not present

## 2017-10-31 DIAGNOSIS — I509 Heart failure, unspecified: Secondary | ICD-10-CM | POA: Diagnosis not present

## 2017-10-31 DIAGNOSIS — F339 Major depressive disorder, recurrent, unspecified: Secondary | ICD-10-CM | POA: Diagnosis not present

## 2017-10-31 DIAGNOSIS — E1159 Type 2 diabetes mellitus with other circulatory complications: Secondary | ICD-10-CM | POA: Diagnosis not present

## 2017-10-31 DIAGNOSIS — E669 Obesity, unspecified: Secondary | ICD-10-CM | POA: Diagnosis not present

## 2017-10-31 DIAGNOSIS — J9610 Chronic respiratory failure, unspecified whether with hypoxia or hypercapnia: Secondary | ICD-10-CM | POA: Diagnosis not present

## 2017-10-31 DIAGNOSIS — J449 Chronic obstructive pulmonary disease, unspecified: Secondary | ICD-10-CM | POA: Diagnosis not present

## 2017-11-01 DIAGNOSIS — J449 Chronic obstructive pulmonary disease, unspecified: Secondary | ICD-10-CM | POA: Diagnosis not present

## 2017-11-01 DIAGNOSIS — F339 Major depressive disorder, recurrent, unspecified: Secondary | ICD-10-CM | POA: Diagnosis not present

## 2017-11-01 DIAGNOSIS — I509 Heart failure, unspecified: Secondary | ICD-10-CM | POA: Diagnosis not present

## 2017-11-01 DIAGNOSIS — J9610 Chronic respiratory failure, unspecified whether with hypoxia or hypercapnia: Secondary | ICD-10-CM | POA: Diagnosis not present

## 2017-11-01 DIAGNOSIS — E669 Obesity, unspecified: Secondary | ICD-10-CM | POA: Diagnosis not present

## 2017-11-01 DIAGNOSIS — E1159 Type 2 diabetes mellitus with other circulatory complications: Secondary | ICD-10-CM | POA: Diagnosis not present

## 2017-11-03 DIAGNOSIS — J449 Chronic obstructive pulmonary disease, unspecified: Secondary | ICD-10-CM | POA: Diagnosis not present

## 2017-11-03 DIAGNOSIS — J9610 Chronic respiratory failure, unspecified whether with hypoxia or hypercapnia: Secondary | ICD-10-CM | POA: Diagnosis not present

## 2017-11-03 DIAGNOSIS — E1159 Type 2 diabetes mellitus with other circulatory complications: Secondary | ICD-10-CM | POA: Diagnosis not present

## 2017-11-03 DIAGNOSIS — F339 Major depressive disorder, recurrent, unspecified: Secondary | ICD-10-CM | POA: Diagnosis not present

## 2017-11-03 DIAGNOSIS — I509 Heart failure, unspecified: Secondary | ICD-10-CM | POA: Diagnosis not present

## 2017-11-03 DIAGNOSIS — E669 Obesity, unspecified: Secondary | ICD-10-CM | POA: Diagnosis not present

## 2017-11-06 DIAGNOSIS — E669 Obesity, unspecified: Secondary | ICD-10-CM | POA: Diagnosis not present

## 2017-11-06 DIAGNOSIS — I509 Heart failure, unspecified: Secondary | ICD-10-CM | POA: Diagnosis not present

## 2017-11-06 DIAGNOSIS — J449 Chronic obstructive pulmonary disease, unspecified: Secondary | ICD-10-CM | POA: Diagnosis not present

## 2017-11-06 DIAGNOSIS — J9610 Chronic respiratory failure, unspecified whether with hypoxia or hypercapnia: Secondary | ICD-10-CM | POA: Diagnosis not present

## 2017-11-06 DIAGNOSIS — E1159 Type 2 diabetes mellitus with other circulatory complications: Secondary | ICD-10-CM | POA: Diagnosis not present

## 2017-11-06 DIAGNOSIS — F339 Major depressive disorder, recurrent, unspecified: Secondary | ICD-10-CM | POA: Diagnosis not present

## 2017-11-07 DIAGNOSIS — J9610 Chronic respiratory failure, unspecified whether with hypoxia or hypercapnia: Secondary | ICD-10-CM | POA: Diagnosis not present

## 2017-11-07 DIAGNOSIS — E1159 Type 2 diabetes mellitus with other circulatory complications: Secondary | ICD-10-CM | POA: Diagnosis not present

## 2017-11-07 DIAGNOSIS — J449 Chronic obstructive pulmonary disease, unspecified: Secondary | ICD-10-CM | POA: Diagnosis not present

## 2017-11-07 DIAGNOSIS — E669 Obesity, unspecified: Secondary | ICD-10-CM | POA: Diagnosis not present

## 2017-11-07 DIAGNOSIS — I509 Heart failure, unspecified: Secondary | ICD-10-CM | POA: Diagnosis not present

## 2017-11-07 DIAGNOSIS — F339 Major depressive disorder, recurrent, unspecified: Secondary | ICD-10-CM | POA: Diagnosis not present

## 2017-11-08 DIAGNOSIS — J9610 Chronic respiratory failure, unspecified whether with hypoxia or hypercapnia: Secondary | ICD-10-CM | POA: Diagnosis not present

## 2017-11-08 DIAGNOSIS — I509 Heart failure, unspecified: Secondary | ICD-10-CM | POA: Diagnosis not present

## 2017-11-08 DIAGNOSIS — E669 Obesity, unspecified: Secondary | ICD-10-CM | POA: Diagnosis not present

## 2017-11-08 DIAGNOSIS — F339 Major depressive disorder, recurrent, unspecified: Secondary | ICD-10-CM | POA: Diagnosis not present

## 2017-11-08 DIAGNOSIS — J449 Chronic obstructive pulmonary disease, unspecified: Secondary | ICD-10-CM | POA: Diagnosis not present

## 2017-11-08 DIAGNOSIS — E1159 Type 2 diabetes mellitus with other circulatory complications: Secondary | ICD-10-CM | POA: Diagnosis not present

## 2017-11-10 DIAGNOSIS — E1159 Type 2 diabetes mellitus with other circulatory complications: Secondary | ICD-10-CM | POA: Diagnosis not present

## 2017-11-10 DIAGNOSIS — F339 Major depressive disorder, recurrent, unspecified: Secondary | ICD-10-CM | POA: Diagnosis not present

## 2017-11-10 DIAGNOSIS — J9610 Chronic respiratory failure, unspecified whether with hypoxia or hypercapnia: Secondary | ICD-10-CM | POA: Diagnosis not present

## 2017-11-10 DIAGNOSIS — E669 Obesity, unspecified: Secondary | ICD-10-CM | POA: Diagnosis not present

## 2017-11-10 DIAGNOSIS — I509 Heart failure, unspecified: Secondary | ICD-10-CM | POA: Diagnosis not present

## 2017-11-10 DIAGNOSIS — J449 Chronic obstructive pulmonary disease, unspecified: Secondary | ICD-10-CM | POA: Diagnosis not present

## 2017-11-13 DIAGNOSIS — E1159 Type 2 diabetes mellitus with other circulatory complications: Secondary | ICD-10-CM | POA: Diagnosis not present

## 2017-11-13 DIAGNOSIS — I509 Heart failure, unspecified: Secondary | ICD-10-CM | POA: Diagnosis not present

## 2017-11-13 DIAGNOSIS — F339 Major depressive disorder, recurrent, unspecified: Secondary | ICD-10-CM | POA: Diagnosis not present

## 2017-11-13 DIAGNOSIS — E669 Obesity, unspecified: Secondary | ICD-10-CM | POA: Diagnosis not present

## 2017-11-13 DIAGNOSIS — J9610 Chronic respiratory failure, unspecified whether with hypoxia or hypercapnia: Secondary | ICD-10-CM | POA: Diagnosis not present

## 2017-11-13 DIAGNOSIS — J449 Chronic obstructive pulmonary disease, unspecified: Secondary | ICD-10-CM | POA: Diagnosis not present

## 2017-11-14 DIAGNOSIS — I509 Heart failure, unspecified: Secondary | ICD-10-CM | POA: Diagnosis not present

## 2017-11-14 DIAGNOSIS — F339 Major depressive disorder, recurrent, unspecified: Secondary | ICD-10-CM | POA: Diagnosis not present

## 2017-11-14 DIAGNOSIS — E669 Obesity, unspecified: Secondary | ICD-10-CM | POA: Diagnosis not present

## 2017-11-14 DIAGNOSIS — J9610 Chronic respiratory failure, unspecified whether with hypoxia or hypercapnia: Secondary | ICD-10-CM | POA: Diagnosis not present

## 2017-11-14 DIAGNOSIS — E1159 Type 2 diabetes mellitus with other circulatory complications: Secondary | ICD-10-CM | POA: Diagnosis not present

## 2017-11-14 DIAGNOSIS — J449 Chronic obstructive pulmonary disease, unspecified: Secondary | ICD-10-CM | POA: Diagnosis not present

## 2017-11-15 DIAGNOSIS — I509 Heart failure, unspecified: Secondary | ICD-10-CM | POA: Diagnosis not present

## 2017-11-15 DIAGNOSIS — J9610 Chronic respiratory failure, unspecified whether with hypoxia or hypercapnia: Secondary | ICD-10-CM | POA: Diagnosis not present

## 2017-11-15 DIAGNOSIS — F339 Major depressive disorder, recurrent, unspecified: Secondary | ICD-10-CM | POA: Diagnosis not present

## 2017-11-15 DIAGNOSIS — J449 Chronic obstructive pulmonary disease, unspecified: Secondary | ICD-10-CM | POA: Diagnosis not present

## 2017-11-15 DIAGNOSIS — E1159 Type 2 diabetes mellitus with other circulatory complications: Secondary | ICD-10-CM | POA: Diagnosis not present

## 2017-11-15 DIAGNOSIS — E669 Obesity, unspecified: Secondary | ICD-10-CM | POA: Diagnosis not present

## 2017-11-16 DIAGNOSIS — E669 Obesity, unspecified: Secondary | ICD-10-CM | POA: Diagnosis not present

## 2017-11-16 DIAGNOSIS — J9610 Chronic respiratory failure, unspecified whether with hypoxia or hypercapnia: Secondary | ICD-10-CM | POA: Diagnosis not present

## 2017-11-16 DIAGNOSIS — F339 Major depressive disorder, recurrent, unspecified: Secondary | ICD-10-CM | POA: Diagnosis not present

## 2017-11-16 DIAGNOSIS — I509 Heart failure, unspecified: Secondary | ICD-10-CM | POA: Diagnosis not present

## 2017-11-16 DIAGNOSIS — E1159 Type 2 diabetes mellitus with other circulatory complications: Secondary | ICD-10-CM | POA: Diagnosis not present

## 2017-11-16 DIAGNOSIS — J449 Chronic obstructive pulmonary disease, unspecified: Secondary | ICD-10-CM | POA: Diagnosis not present

## 2017-11-17 DIAGNOSIS — F339 Major depressive disorder, recurrent, unspecified: Secondary | ICD-10-CM | POA: Diagnosis not present

## 2017-11-17 DIAGNOSIS — J9610 Chronic respiratory failure, unspecified whether with hypoxia or hypercapnia: Secondary | ICD-10-CM | POA: Diagnosis not present

## 2017-11-17 DIAGNOSIS — J449 Chronic obstructive pulmonary disease, unspecified: Secondary | ICD-10-CM | POA: Diagnosis not present

## 2017-11-17 DIAGNOSIS — E1159 Type 2 diabetes mellitus with other circulatory complications: Secondary | ICD-10-CM | POA: Diagnosis not present

## 2017-11-17 DIAGNOSIS — I509 Heart failure, unspecified: Secondary | ICD-10-CM | POA: Diagnosis not present

## 2017-11-17 DIAGNOSIS — E669 Obesity, unspecified: Secondary | ICD-10-CM | POA: Diagnosis not present

## 2017-11-20 DIAGNOSIS — I509 Heart failure, unspecified: Secondary | ICD-10-CM | POA: Diagnosis not present

## 2017-11-20 DIAGNOSIS — E669 Obesity, unspecified: Secondary | ICD-10-CM | POA: Diagnosis not present

## 2017-11-20 DIAGNOSIS — J449 Chronic obstructive pulmonary disease, unspecified: Secondary | ICD-10-CM | POA: Diagnosis not present

## 2017-11-20 DIAGNOSIS — F339 Major depressive disorder, recurrent, unspecified: Secondary | ICD-10-CM | POA: Diagnosis not present

## 2017-11-20 DIAGNOSIS — E1159 Type 2 diabetes mellitus with other circulatory complications: Secondary | ICD-10-CM | POA: Diagnosis not present

## 2017-11-20 DIAGNOSIS — J9610 Chronic respiratory failure, unspecified whether with hypoxia or hypercapnia: Secondary | ICD-10-CM | POA: Diagnosis not present

## 2017-11-22 DIAGNOSIS — E1159 Type 2 diabetes mellitus with other circulatory complications: Secondary | ICD-10-CM | POA: Diagnosis not present

## 2017-11-22 DIAGNOSIS — E669 Obesity, unspecified: Secondary | ICD-10-CM | POA: Diagnosis not present

## 2017-11-22 DIAGNOSIS — F339 Major depressive disorder, recurrent, unspecified: Secondary | ICD-10-CM | POA: Diagnosis not present

## 2017-11-22 DIAGNOSIS — I509 Heart failure, unspecified: Secondary | ICD-10-CM | POA: Diagnosis not present

## 2017-11-22 DIAGNOSIS — J449 Chronic obstructive pulmonary disease, unspecified: Secondary | ICD-10-CM | POA: Diagnosis not present

## 2017-11-22 DIAGNOSIS — J9610 Chronic respiratory failure, unspecified whether with hypoxia or hypercapnia: Secondary | ICD-10-CM | POA: Diagnosis not present

## 2017-11-24 DIAGNOSIS — J9610 Chronic respiratory failure, unspecified whether with hypoxia or hypercapnia: Secondary | ICD-10-CM | POA: Diagnosis not present

## 2017-11-24 DIAGNOSIS — E669 Obesity, unspecified: Secondary | ICD-10-CM | POA: Diagnosis not present

## 2017-11-24 DIAGNOSIS — F339 Major depressive disorder, recurrent, unspecified: Secondary | ICD-10-CM | POA: Diagnosis not present

## 2017-11-24 DIAGNOSIS — I509 Heart failure, unspecified: Secondary | ICD-10-CM | POA: Diagnosis not present

## 2017-11-24 DIAGNOSIS — E1159 Type 2 diabetes mellitus with other circulatory complications: Secondary | ICD-10-CM | POA: Diagnosis not present

## 2017-11-24 DIAGNOSIS — J449 Chronic obstructive pulmonary disease, unspecified: Secondary | ICD-10-CM | POA: Diagnosis not present

## 2017-11-27 DIAGNOSIS — I10 Essential (primary) hypertension: Secondary | ICD-10-CM | POA: Diagnosis not present

## 2017-11-27 DIAGNOSIS — I509 Heart failure, unspecified: Secondary | ICD-10-CM | POA: Diagnosis not present

## 2017-11-27 DIAGNOSIS — J449 Chronic obstructive pulmonary disease, unspecified: Secondary | ICD-10-CM | POA: Diagnosis not present

## 2017-11-27 DIAGNOSIS — N189 Chronic kidney disease, unspecified: Secondary | ICD-10-CM | POA: Diagnosis not present

## 2017-11-27 DIAGNOSIS — E1159 Type 2 diabetes mellitus with other circulatory complications: Secondary | ICD-10-CM | POA: Diagnosis not present

## 2017-11-27 DIAGNOSIS — F339 Major depressive disorder, recurrent, unspecified: Secondary | ICD-10-CM | POA: Diagnosis not present

## 2017-11-27 DIAGNOSIS — J301 Allergic rhinitis due to pollen: Secondary | ICD-10-CM | POA: Diagnosis not present

## 2017-11-27 DIAGNOSIS — E669 Obesity, unspecified: Secondary | ICD-10-CM | POA: Diagnosis not present

## 2017-11-27 DIAGNOSIS — J9610 Chronic respiratory failure, unspecified whether with hypoxia or hypercapnia: Secondary | ICD-10-CM | POA: Diagnosis not present

## 2017-11-27 DIAGNOSIS — K219 Gastro-esophageal reflux disease without esophagitis: Secondary | ICD-10-CM | POA: Diagnosis not present

## 2017-11-29 DIAGNOSIS — E1159 Type 2 diabetes mellitus with other circulatory complications: Secondary | ICD-10-CM | POA: Diagnosis not present

## 2017-11-29 DIAGNOSIS — F339 Major depressive disorder, recurrent, unspecified: Secondary | ICD-10-CM | POA: Diagnosis not present

## 2017-11-29 DIAGNOSIS — E669 Obesity, unspecified: Secondary | ICD-10-CM | POA: Diagnosis not present

## 2017-11-29 DIAGNOSIS — I509 Heart failure, unspecified: Secondary | ICD-10-CM | POA: Diagnosis not present

## 2017-11-29 DIAGNOSIS — J449 Chronic obstructive pulmonary disease, unspecified: Secondary | ICD-10-CM | POA: Diagnosis not present

## 2017-11-29 DIAGNOSIS — J9610 Chronic respiratory failure, unspecified whether with hypoxia or hypercapnia: Secondary | ICD-10-CM | POA: Diagnosis not present

## 2017-12-01 DIAGNOSIS — J9610 Chronic respiratory failure, unspecified whether with hypoxia or hypercapnia: Secondary | ICD-10-CM | POA: Diagnosis not present

## 2017-12-01 DIAGNOSIS — I509 Heart failure, unspecified: Secondary | ICD-10-CM | POA: Diagnosis not present

## 2017-12-01 DIAGNOSIS — E1159 Type 2 diabetes mellitus with other circulatory complications: Secondary | ICD-10-CM | POA: Diagnosis not present

## 2017-12-01 DIAGNOSIS — E669 Obesity, unspecified: Secondary | ICD-10-CM | POA: Diagnosis not present

## 2017-12-01 DIAGNOSIS — J449 Chronic obstructive pulmonary disease, unspecified: Secondary | ICD-10-CM | POA: Diagnosis not present

## 2017-12-01 DIAGNOSIS — F339 Major depressive disorder, recurrent, unspecified: Secondary | ICD-10-CM | POA: Diagnosis not present

## 2017-12-04 DIAGNOSIS — E669 Obesity, unspecified: Secondary | ICD-10-CM | POA: Diagnosis not present

## 2017-12-04 DIAGNOSIS — J449 Chronic obstructive pulmonary disease, unspecified: Secondary | ICD-10-CM | POA: Diagnosis not present

## 2017-12-04 DIAGNOSIS — J9610 Chronic respiratory failure, unspecified whether with hypoxia or hypercapnia: Secondary | ICD-10-CM | POA: Diagnosis not present

## 2017-12-04 DIAGNOSIS — E1159 Type 2 diabetes mellitus with other circulatory complications: Secondary | ICD-10-CM | POA: Diagnosis not present

## 2017-12-04 DIAGNOSIS — I509 Heart failure, unspecified: Secondary | ICD-10-CM | POA: Diagnosis not present

## 2017-12-04 DIAGNOSIS — F339 Major depressive disorder, recurrent, unspecified: Secondary | ICD-10-CM | POA: Diagnosis not present

## 2017-12-06 DIAGNOSIS — E669 Obesity, unspecified: Secondary | ICD-10-CM | POA: Diagnosis not present

## 2017-12-06 DIAGNOSIS — E1159 Type 2 diabetes mellitus with other circulatory complications: Secondary | ICD-10-CM | POA: Diagnosis not present

## 2017-12-06 DIAGNOSIS — F339 Major depressive disorder, recurrent, unspecified: Secondary | ICD-10-CM | POA: Diagnosis not present

## 2017-12-06 DIAGNOSIS — I509 Heart failure, unspecified: Secondary | ICD-10-CM | POA: Diagnosis not present

## 2017-12-06 DIAGNOSIS — J9610 Chronic respiratory failure, unspecified whether with hypoxia or hypercapnia: Secondary | ICD-10-CM | POA: Diagnosis not present

## 2017-12-06 DIAGNOSIS — J449 Chronic obstructive pulmonary disease, unspecified: Secondary | ICD-10-CM | POA: Diagnosis not present

## 2017-12-07 DIAGNOSIS — I509 Heart failure, unspecified: Secondary | ICD-10-CM | POA: Diagnosis not present

## 2017-12-07 DIAGNOSIS — F339 Major depressive disorder, recurrent, unspecified: Secondary | ICD-10-CM | POA: Diagnosis not present

## 2017-12-07 DIAGNOSIS — E669 Obesity, unspecified: Secondary | ICD-10-CM | POA: Diagnosis not present

## 2017-12-07 DIAGNOSIS — E1159 Type 2 diabetes mellitus with other circulatory complications: Secondary | ICD-10-CM | POA: Diagnosis not present

## 2017-12-07 DIAGNOSIS — J9610 Chronic respiratory failure, unspecified whether with hypoxia or hypercapnia: Secondary | ICD-10-CM | POA: Diagnosis not present

## 2017-12-07 DIAGNOSIS — J449 Chronic obstructive pulmonary disease, unspecified: Secondary | ICD-10-CM | POA: Diagnosis not present

## 2017-12-08 DIAGNOSIS — I509 Heart failure, unspecified: Secondary | ICD-10-CM | POA: Diagnosis not present

## 2017-12-08 DIAGNOSIS — E669 Obesity, unspecified: Secondary | ICD-10-CM | POA: Diagnosis not present

## 2017-12-08 DIAGNOSIS — J449 Chronic obstructive pulmonary disease, unspecified: Secondary | ICD-10-CM | POA: Diagnosis not present

## 2017-12-08 DIAGNOSIS — F339 Major depressive disorder, recurrent, unspecified: Secondary | ICD-10-CM | POA: Diagnosis not present

## 2017-12-08 DIAGNOSIS — J9610 Chronic respiratory failure, unspecified whether with hypoxia or hypercapnia: Secondary | ICD-10-CM | POA: Diagnosis not present

## 2017-12-08 DIAGNOSIS — E1159 Type 2 diabetes mellitus with other circulatory complications: Secondary | ICD-10-CM | POA: Diagnosis not present

## 2017-12-11 DIAGNOSIS — E669 Obesity, unspecified: Secondary | ICD-10-CM | POA: Diagnosis not present

## 2017-12-11 DIAGNOSIS — E1159 Type 2 diabetes mellitus with other circulatory complications: Secondary | ICD-10-CM | POA: Diagnosis not present

## 2017-12-11 DIAGNOSIS — F339 Major depressive disorder, recurrent, unspecified: Secondary | ICD-10-CM | POA: Diagnosis not present

## 2017-12-11 DIAGNOSIS — I509 Heart failure, unspecified: Secondary | ICD-10-CM | POA: Diagnosis not present

## 2017-12-11 DIAGNOSIS — J449 Chronic obstructive pulmonary disease, unspecified: Secondary | ICD-10-CM | POA: Diagnosis not present

## 2017-12-11 DIAGNOSIS — J9610 Chronic respiratory failure, unspecified whether with hypoxia or hypercapnia: Secondary | ICD-10-CM | POA: Diagnosis not present

## 2017-12-13 DIAGNOSIS — J9610 Chronic respiratory failure, unspecified whether with hypoxia or hypercapnia: Secondary | ICD-10-CM | POA: Diagnosis not present

## 2017-12-13 DIAGNOSIS — E1159 Type 2 diabetes mellitus with other circulatory complications: Secondary | ICD-10-CM | POA: Diagnosis not present

## 2017-12-13 DIAGNOSIS — I509 Heart failure, unspecified: Secondary | ICD-10-CM | POA: Diagnosis not present

## 2017-12-13 DIAGNOSIS — J449 Chronic obstructive pulmonary disease, unspecified: Secondary | ICD-10-CM | POA: Diagnosis not present

## 2017-12-13 DIAGNOSIS — E669 Obesity, unspecified: Secondary | ICD-10-CM | POA: Diagnosis not present

## 2017-12-13 DIAGNOSIS — F339 Major depressive disorder, recurrent, unspecified: Secondary | ICD-10-CM | POA: Diagnosis not present

## 2017-12-14 DIAGNOSIS — I509 Heart failure, unspecified: Secondary | ICD-10-CM | POA: Diagnosis not present

## 2017-12-14 DIAGNOSIS — J9610 Chronic respiratory failure, unspecified whether with hypoxia or hypercapnia: Secondary | ICD-10-CM | POA: Diagnosis not present

## 2017-12-14 DIAGNOSIS — J449 Chronic obstructive pulmonary disease, unspecified: Secondary | ICD-10-CM | POA: Diagnosis not present

## 2017-12-14 DIAGNOSIS — F339 Major depressive disorder, recurrent, unspecified: Secondary | ICD-10-CM | POA: Diagnosis not present

## 2017-12-14 DIAGNOSIS — E1159 Type 2 diabetes mellitus with other circulatory complications: Secondary | ICD-10-CM | POA: Diagnosis not present

## 2017-12-14 DIAGNOSIS — E669 Obesity, unspecified: Secondary | ICD-10-CM | POA: Diagnosis not present

## 2017-12-15 DIAGNOSIS — E669 Obesity, unspecified: Secondary | ICD-10-CM | POA: Diagnosis not present

## 2017-12-15 DIAGNOSIS — E1159 Type 2 diabetes mellitus with other circulatory complications: Secondary | ICD-10-CM | POA: Diagnosis not present

## 2017-12-15 DIAGNOSIS — J9610 Chronic respiratory failure, unspecified whether with hypoxia or hypercapnia: Secondary | ICD-10-CM | POA: Diagnosis not present

## 2017-12-15 DIAGNOSIS — I509 Heart failure, unspecified: Secondary | ICD-10-CM | POA: Diagnosis not present

## 2017-12-15 DIAGNOSIS — J449 Chronic obstructive pulmonary disease, unspecified: Secondary | ICD-10-CM | POA: Diagnosis not present

## 2017-12-15 DIAGNOSIS — F339 Major depressive disorder, recurrent, unspecified: Secondary | ICD-10-CM | POA: Diagnosis not present

## 2017-12-18 DIAGNOSIS — I509 Heart failure, unspecified: Secondary | ICD-10-CM | POA: Diagnosis not present

## 2017-12-18 DIAGNOSIS — F339 Major depressive disorder, recurrent, unspecified: Secondary | ICD-10-CM | POA: Diagnosis not present

## 2017-12-18 DIAGNOSIS — E669 Obesity, unspecified: Secondary | ICD-10-CM | POA: Diagnosis not present

## 2017-12-18 DIAGNOSIS — E1159 Type 2 diabetes mellitus with other circulatory complications: Secondary | ICD-10-CM | POA: Diagnosis not present

## 2017-12-18 DIAGNOSIS — J9610 Chronic respiratory failure, unspecified whether with hypoxia or hypercapnia: Secondary | ICD-10-CM | POA: Diagnosis not present

## 2017-12-18 DIAGNOSIS — J449 Chronic obstructive pulmonary disease, unspecified: Secondary | ICD-10-CM | POA: Diagnosis not present

## 2017-12-20 DIAGNOSIS — I509 Heart failure, unspecified: Secondary | ICD-10-CM | POA: Diagnosis not present

## 2017-12-20 DIAGNOSIS — E1159 Type 2 diabetes mellitus with other circulatory complications: Secondary | ICD-10-CM | POA: Diagnosis not present

## 2017-12-20 DIAGNOSIS — J449 Chronic obstructive pulmonary disease, unspecified: Secondary | ICD-10-CM | POA: Diagnosis not present

## 2017-12-20 DIAGNOSIS — E669 Obesity, unspecified: Secondary | ICD-10-CM | POA: Diagnosis not present

## 2017-12-20 DIAGNOSIS — J9610 Chronic respiratory failure, unspecified whether with hypoxia or hypercapnia: Secondary | ICD-10-CM | POA: Diagnosis not present

## 2017-12-20 DIAGNOSIS — F339 Major depressive disorder, recurrent, unspecified: Secondary | ICD-10-CM | POA: Diagnosis not present

## 2017-12-22 DIAGNOSIS — E669 Obesity, unspecified: Secondary | ICD-10-CM | POA: Diagnosis not present

## 2017-12-22 DIAGNOSIS — J9610 Chronic respiratory failure, unspecified whether with hypoxia or hypercapnia: Secondary | ICD-10-CM | POA: Diagnosis not present

## 2017-12-22 DIAGNOSIS — E1159 Type 2 diabetes mellitus with other circulatory complications: Secondary | ICD-10-CM | POA: Diagnosis not present

## 2017-12-22 DIAGNOSIS — I509 Heart failure, unspecified: Secondary | ICD-10-CM | POA: Diagnosis not present

## 2017-12-22 DIAGNOSIS — J449 Chronic obstructive pulmonary disease, unspecified: Secondary | ICD-10-CM | POA: Diagnosis not present

## 2017-12-22 DIAGNOSIS — F339 Major depressive disorder, recurrent, unspecified: Secondary | ICD-10-CM | POA: Diagnosis not present

## 2017-12-25 DIAGNOSIS — I509 Heart failure, unspecified: Secondary | ICD-10-CM | POA: Diagnosis not present

## 2017-12-25 DIAGNOSIS — E1159 Type 2 diabetes mellitus with other circulatory complications: Secondary | ICD-10-CM | POA: Diagnosis not present

## 2017-12-25 DIAGNOSIS — E669 Obesity, unspecified: Secondary | ICD-10-CM | POA: Diagnosis not present

## 2017-12-25 DIAGNOSIS — J9610 Chronic respiratory failure, unspecified whether with hypoxia or hypercapnia: Secondary | ICD-10-CM | POA: Diagnosis not present

## 2017-12-25 DIAGNOSIS — F339 Major depressive disorder, recurrent, unspecified: Secondary | ICD-10-CM | POA: Diagnosis not present

## 2017-12-25 DIAGNOSIS — J449 Chronic obstructive pulmonary disease, unspecified: Secondary | ICD-10-CM | POA: Diagnosis not present

## 2017-12-27 DIAGNOSIS — F339 Major depressive disorder, recurrent, unspecified: Secondary | ICD-10-CM | POA: Diagnosis not present

## 2017-12-27 DIAGNOSIS — E669 Obesity, unspecified: Secondary | ICD-10-CM | POA: Diagnosis not present

## 2017-12-27 DIAGNOSIS — E1159 Type 2 diabetes mellitus with other circulatory complications: Secondary | ICD-10-CM | POA: Diagnosis not present

## 2017-12-27 DIAGNOSIS — J9610 Chronic respiratory failure, unspecified whether with hypoxia or hypercapnia: Secondary | ICD-10-CM | POA: Diagnosis not present

## 2017-12-27 DIAGNOSIS — J449 Chronic obstructive pulmonary disease, unspecified: Secondary | ICD-10-CM | POA: Diagnosis not present

## 2017-12-27 DIAGNOSIS — I509 Heart failure, unspecified: Secondary | ICD-10-CM | POA: Diagnosis not present

## 2017-12-28 DIAGNOSIS — J9610 Chronic respiratory failure, unspecified whether with hypoxia or hypercapnia: Secondary | ICD-10-CM | POA: Diagnosis not present

## 2017-12-28 DIAGNOSIS — I509 Heart failure, unspecified: Secondary | ICD-10-CM | POA: Diagnosis not present

## 2017-12-28 DIAGNOSIS — J301 Allergic rhinitis due to pollen: Secondary | ICD-10-CM | POA: Diagnosis not present

## 2017-12-28 DIAGNOSIS — I10 Essential (primary) hypertension: Secondary | ICD-10-CM | POA: Diagnosis not present

## 2017-12-28 DIAGNOSIS — J449 Chronic obstructive pulmonary disease, unspecified: Secondary | ICD-10-CM | POA: Diagnosis not present

## 2017-12-28 DIAGNOSIS — E1159 Type 2 diabetes mellitus with other circulatory complications: Secondary | ICD-10-CM | POA: Diagnosis not present

## 2017-12-28 DIAGNOSIS — E669 Obesity, unspecified: Secondary | ICD-10-CM | POA: Diagnosis not present

## 2017-12-28 DIAGNOSIS — K219 Gastro-esophageal reflux disease without esophagitis: Secondary | ICD-10-CM | POA: Diagnosis not present

## 2017-12-28 DIAGNOSIS — F339 Major depressive disorder, recurrent, unspecified: Secondary | ICD-10-CM | POA: Diagnosis not present

## 2017-12-28 DIAGNOSIS — N189 Chronic kidney disease, unspecified: Secondary | ICD-10-CM | POA: Diagnosis not present

## 2017-12-29 DIAGNOSIS — J449 Chronic obstructive pulmonary disease, unspecified: Secondary | ICD-10-CM | POA: Diagnosis not present

## 2017-12-29 DIAGNOSIS — E1159 Type 2 diabetes mellitus with other circulatory complications: Secondary | ICD-10-CM | POA: Diagnosis not present

## 2017-12-29 DIAGNOSIS — E669 Obesity, unspecified: Secondary | ICD-10-CM | POA: Diagnosis not present

## 2017-12-29 DIAGNOSIS — F339 Major depressive disorder, recurrent, unspecified: Secondary | ICD-10-CM | POA: Diagnosis not present

## 2017-12-29 DIAGNOSIS — I509 Heart failure, unspecified: Secondary | ICD-10-CM | POA: Diagnosis not present

## 2017-12-29 DIAGNOSIS — J9610 Chronic respiratory failure, unspecified whether with hypoxia or hypercapnia: Secondary | ICD-10-CM | POA: Diagnosis not present

## 2018-01-01 DIAGNOSIS — F339 Major depressive disorder, recurrent, unspecified: Secondary | ICD-10-CM | POA: Diagnosis not present

## 2018-01-01 DIAGNOSIS — E669 Obesity, unspecified: Secondary | ICD-10-CM | POA: Diagnosis not present

## 2018-01-01 DIAGNOSIS — E1159 Type 2 diabetes mellitus with other circulatory complications: Secondary | ICD-10-CM | POA: Diagnosis not present

## 2018-01-01 DIAGNOSIS — J9610 Chronic respiratory failure, unspecified whether with hypoxia or hypercapnia: Secondary | ICD-10-CM | POA: Diagnosis not present

## 2018-01-01 DIAGNOSIS — I509 Heart failure, unspecified: Secondary | ICD-10-CM | POA: Diagnosis not present

## 2018-01-01 DIAGNOSIS — J449 Chronic obstructive pulmonary disease, unspecified: Secondary | ICD-10-CM | POA: Diagnosis not present

## 2018-01-03 DIAGNOSIS — I509 Heart failure, unspecified: Secondary | ICD-10-CM | POA: Diagnosis not present

## 2018-01-03 DIAGNOSIS — J9610 Chronic respiratory failure, unspecified whether with hypoxia or hypercapnia: Secondary | ICD-10-CM | POA: Diagnosis not present

## 2018-01-03 DIAGNOSIS — J449 Chronic obstructive pulmonary disease, unspecified: Secondary | ICD-10-CM | POA: Diagnosis not present

## 2018-01-03 DIAGNOSIS — F339 Major depressive disorder, recurrent, unspecified: Secondary | ICD-10-CM | POA: Diagnosis not present

## 2018-01-03 DIAGNOSIS — E669 Obesity, unspecified: Secondary | ICD-10-CM | POA: Diagnosis not present

## 2018-01-03 DIAGNOSIS — E1159 Type 2 diabetes mellitus with other circulatory complications: Secondary | ICD-10-CM | POA: Diagnosis not present

## 2018-01-04 DIAGNOSIS — F339 Major depressive disorder, recurrent, unspecified: Secondary | ICD-10-CM | POA: Diagnosis not present

## 2018-01-04 DIAGNOSIS — J449 Chronic obstructive pulmonary disease, unspecified: Secondary | ICD-10-CM | POA: Diagnosis not present

## 2018-01-04 DIAGNOSIS — I509 Heart failure, unspecified: Secondary | ICD-10-CM | POA: Diagnosis not present

## 2018-01-04 DIAGNOSIS — E669 Obesity, unspecified: Secondary | ICD-10-CM | POA: Diagnosis not present

## 2018-01-04 DIAGNOSIS — J9610 Chronic respiratory failure, unspecified whether with hypoxia or hypercapnia: Secondary | ICD-10-CM | POA: Diagnosis not present

## 2018-01-04 DIAGNOSIS — E1159 Type 2 diabetes mellitus with other circulatory complications: Secondary | ICD-10-CM | POA: Diagnosis not present

## 2018-01-05 DIAGNOSIS — I509 Heart failure, unspecified: Secondary | ICD-10-CM | POA: Diagnosis not present

## 2018-01-05 DIAGNOSIS — F339 Major depressive disorder, recurrent, unspecified: Secondary | ICD-10-CM | POA: Diagnosis not present

## 2018-01-05 DIAGNOSIS — E1159 Type 2 diabetes mellitus with other circulatory complications: Secondary | ICD-10-CM | POA: Diagnosis not present

## 2018-01-05 DIAGNOSIS — J9610 Chronic respiratory failure, unspecified whether with hypoxia or hypercapnia: Secondary | ICD-10-CM | POA: Diagnosis not present

## 2018-01-05 DIAGNOSIS — J449 Chronic obstructive pulmonary disease, unspecified: Secondary | ICD-10-CM | POA: Diagnosis not present

## 2018-01-05 DIAGNOSIS — E669 Obesity, unspecified: Secondary | ICD-10-CM | POA: Diagnosis not present

## 2018-01-08 DIAGNOSIS — J9610 Chronic respiratory failure, unspecified whether with hypoxia or hypercapnia: Secondary | ICD-10-CM | POA: Diagnosis not present

## 2018-01-08 DIAGNOSIS — I509 Heart failure, unspecified: Secondary | ICD-10-CM | POA: Diagnosis not present

## 2018-01-08 DIAGNOSIS — E669 Obesity, unspecified: Secondary | ICD-10-CM | POA: Diagnosis not present

## 2018-01-08 DIAGNOSIS — F339 Major depressive disorder, recurrent, unspecified: Secondary | ICD-10-CM | POA: Diagnosis not present

## 2018-01-08 DIAGNOSIS — E1159 Type 2 diabetes mellitus with other circulatory complications: Secondary | ICD-10-CM | POA: Diagnosis not present

## 2018-01-08 DIAGNOSIS — J449 Chronic obstructive pulmonary disease, unspecified: Secondary | ICD-10-CM | POA: Diagnosis not present

## 2018-01-09 DIAGNOSIS — E669 Obesity, unspecified: Secondary | ICD-10-CM | POA: Diagnosis not present

## 2018-01-09 DIAGNOSIS — I509 Heart failure, unspecified: Secondary | ICD-10-CM | POA: Diagnosis not present

## 2018-01-09 DIAGNOSIS — J9610 Chronic respiratory failure, unspecified whether with hypoxia or hypercapnia: Secondary | ICD-10-CM | POA: Diagnosis not present

## 2018-01-09 DIAGNOSIS — F339 Major depressive disorder, recurrent, unspecified: Secondary | ICD-10-CM | POA: Diagnosis not present

## 2018-01-09 DIAGNOSIS — J449 Chronic obstructive pulmonary disease, unspecified: Secondary | ICD-10-CM | POA: Diagnosis not present

## 2018-01-09 DIAGNOSIS — E1159 Type 2 diabetes mellitus with other circulatory complications: Secondary | ICD-10-CM | POA: Diagnosis not present

## 2018-01-10 DIAGNOSIS — E1159 Type 2 diabetes mellitus with other circulatory complications: Secondary | ICD-10-CM | POA: Diagnosis not present

## 2018-01-10 DIAGNOSIS — I509 Heart failure, unspecified: Secondary | ICD-10-CM | POA: Diagnosis not present

## 2018-01-10 DIAGNOSIS — J449 Chronic obstructive pulmonary disease, unspecified: Secondary | ICD-10-CM | POA: Diagnosis not present

## 2018-01-10 DIAGNOSIS — E669 Obesity, unspecified: Secondary | ICD-10-CM | POA: Diagnosis not present

## 2018-01-10 DIAGNOSIS — F339 Major depressive disorder, recurrent, unspecified: Secondary | ICD-10-CM | POA: Diagnosis not present

## 2018-01-10 DIAGNOSIS — J9610 Chronic respiratory failure, unspecified whether with hypoxia or hypercapnia: Secondary | ICD-10-CM | POA: Diagnosis not present

## 2018-01-12 DIAGNOSIS — J449 Chronic obstructive pulmonary disease, unspecified: Secondary | ICD-10-CM | POA: Diagnosis not present

## 2018-01-12 DIAGNOSIS — J9610 Chronic respiratory failure, unspecified whether with hypoxia or hypercapnia: Secondary | ICD-10-CM | POA: Diagnosis not present

## 2018-01-12 DIAGNOSIS — E1159 Type 2 diabetes mellitus with other circulatory complications: Secondary | ICD-10-CM | POA: Diagnosis not present

## 2018-01-12 DIAGNOSIS — F339 Major depressive disorder, recurrent, unspecified: Secondary | ICD-10-CM | POA: Diagnosis not present

## 2018-01-12 DIAGNOSIS — I509 Heart failure, unspecified: Secondary | ICD-10-CM | POA: Diagnosis not present

## 2018-01-12 DIAGNOSIS — E669 Obesity, unspecified: Secondary | ICD-10-CM | POA: Diagnosis not present

## 2018-01-15 DIAGNOSIS — E1159 Type 2 diabetes mellitus with other circulatory complications: Secondary | ICD-10-CM | POA: Diagnosis not present

## 2018-01-15 DIAGNOSIS — I509 Heart failure, unspecified: Secondary | ICD-10-CM | POA: Diagnosis not present

## 2018-01-15 DIAGNOSIS — J449 Chronic obstructive pulmonary disease, unspecified: Secondary | ICD-10-CM | POA: Diagnosis not present

## 2018-01-15 DIAGNOSIS — F339 Major depressive disorder, recurrent, unspecified: Secondary | ICD-10-CM | POA: Diagnosis not present

## 2018-01-15 DIAGNOSIS — E669 Obesity, unspecified: Secondary | ICD-10-CM | POA: Diagnosis not present

## 2018-01-15 DIAGNOSIS — J9610 Chronic respiratory failure, unspecified whether with hypoxia or hypercapnia: Secondary | ICD-10-CM | POA: Diagnosis not present

## 2018-01-17 DIAGNOSIS — I509 Heart failure, unspecified: Secondary | ICD-10-CM | POA: Diagnosis not present

## 2018-01-17 DIAGNOSIS — J9610 Chronic respiratory failure, unspecified whether with hypoxia or hypercapnia: Secondary | ICD-10-CM | POA: Diagnosis not present

## 2018-01-17 DIAGNOSIS — E1159 Type 2 diabetes mellitus with other circulatory complications: Secondary | ICD-10-CM | POA: Diagnosis not present

## 2018-01-17 DIAGNOSIS — J449 Chronic obstructive pulmonary disease, unspecified: Secondary | ICD-10-CM | POA: Diagnosis not present

## 2018-01-17 DIAGNOSIS — F339 Major depressive disorder, recurrent, unspecified: Secondary | ICD-10-CM | POA: Diagnosis not present

## 2018-01-17 DIAGNOSIS — E669 Obesity, unspecified: Secondary | ICD-10-CM | POA: Diagnosis not present

## 2018-01-18 DIAGNOSIS — J449 Chronic obstructive pulmonary disease, unspecified: Secondary | ICD-10-CM | POA: Diagnosis not present

## 2018-01-18 DIAGNOSIS — E669 Obesity, unspecified: Secondary | ICD-10-CM | POA: Diagnosis not present

## 2018-01-18 DIAGNOSIS — F339 Major depressive disorder, recurrent, unspecified: Secondary | ICD-10-CM | POA: Diagnosis not present

## 2018-01-18 DIAGNOSIS — I509 Heart failure, unspecified: Secondary | ICD-10-CM | POA: Diagnosis not present

## 2018-01-18 DIAGNOSIS — E1159 Type 2 diabetes mellitus with other circulatory complications: Secondary | ICD-10-CM | POA: Diagnosis not present

## 2018-01-18 DIAGNOSIS — J9610 Chronic respiratory failure, unspecified whether with hypoxia or hypercapnia: Secondary | ICD-10-CM | POA: Diagnosis not present

## 2018-01-19 DIAGNOSIS — J9610 Chronic respiratory failure, unspecified whether with hypoxia or hypercapnia: Secondary | ICD-10-CM | POA: Diagnosis not present

## 2018-01-19 DIAGNOSIS — J449 Chronic obstructive pulmonary disease, unspecified: Secondary | ICD-10-CM | POA: Diagnosis not present

## 2018-01-19 DIAGNOSIS — E669 Obesity, unspecified: Secondary | ICD-10-CM | POA: Diagnosis not present

## 2018-01-19 DIAGNOSIS — F339 Major depressive disorder, recurrent, unspecified: Secondary | ICD-10-CM | POA: Diagnosis not present

## 2018-01-19 DIAGNOSIS — E1159 Type 2 diabetes mellitus with other circulatory complications: Secondary | ICD-10-CM | POA: Diagnosis not present

## 2018-01-19 DIAGNOSIS — I509 Heart failure, unspecified: Secondary | ICD-10-CM | POA: Diagnosis not present

## 2018-01-22 DIAGNOSIS — J9610 Chronic respiratory failure, unspecified whether with hypoxia or hypercapnia: Secondary | ICD-10-CM | POA: Diagnosis not present

## 2018-01-22 DIAGNOSIS — I509 Heart failure, unspecified: Secondary | ICD-10-CM | POA: Diagnosis not present

## 2018-01-22 DIAGNOSIS — E1159 Type 2 diabetes mellitus with other circulatory complications: Secondary | ICD-10-CM | POA: Diagnosis not present

## 2018-01-22 DIAGNOSIS — F339 Major depressive disorder, recurrent, unspecified: Secondary | ICD-10-CM | POA: Diagnosis not present

## 2018-01-22 DIAGNOSIS — E669 Obesity, unspecified: Secondary | ICD-10-CM | POA: Diagnosis not present

## 2018-01-22 DIAGNOSIS — J449 Chronic obstructive pulmonary disease, unspecified: Secondary | ICD-10-CM | POA: Diagnosis not present

## 2018-01-24 DIAGNOSIS — E1159 Type 2 diabetes mellitus with other circulatory complications: Secondary | ICD-10-CM | POA: Diagnosis not present

## 2018-01-24 DIAGNOSIS — J449 Chronic obstructive pulmonary disease, unspecified: Secondary | ICD-10-CM | POA: Diagnosis not present

## 2018-01-24 DIAGNOSIS — F339 Major depressive disorder, recurrent, unspecified: Secondary | ICD-10-CM | POA: Diagnosis not present

## 2018-01-24 DIAGNOSIS — E669 Obesity, unspecified: Secondary | ICD-10-CM | POA: Diagnosis not present

## 2018-01-24 DIAGNOSIS — I509 Heart failure, unspecified: Secondary | ICD-10-CM | POA: Diagnosis not present

## 2018-01-24 DIAGNOSIS — J9610 Chronic respiratory failure, unspecified whether with hypoxia or hypercapnia: Secondary | ICD-10-CM | POA: Diagnosis not present

## 2018-01-26 DIAGNOSIS — J9610 Chronic respiratory failure, unspecified whether with hypoxia or hypercapnia: Secondary | ICD-10-CM | POA: Diagnosis not present

## 2018-01-26 DIAGNOSIS — E1159 Type 2 diabetes mellitus with other circulatory complications: Secondary | ICD-10-CM | POA: Diagnosis not present

## 2018-01-26 DIAGNOSIS — E669 Obesity, unspecified: Secondary | ICD-10-CM | POA: Diagnosis not present

## 2018-01-26 DIAGNOSIS — I509 Heart failure, unspecified: Secondary | ICD-10-CM | POA: Diagnosis not present

## 2018-01-26 DIAGNOSIS — J449 Chronic obstructive pulmonary disease, unspecified: Secondary | ICD-10-CM | POA: Diagnosis not present

## 2018-01-26 DIAGNOSIS — F339 Major depressive disorder, recurrent, unspecified: Secondary | ICD-10-CM | POA: Diagnosis not present

## 2018-01-28 DIAGNOSIS — K219 Gastro-esophageal reflux disease without esophagitis: Secondary | ICD-10-CM | POA: Diagnosis not present

## 2018-01-28 DIAGNOSIS — N189 Chronic kidney disease, unspecified: Secondary | ICD-10-CM | POA: Diagnosis not present

## 2018-01-28 DIAGNOSIS — I509 Heart failure, unspecified: Secondary | ICD-10-CM | POA: Diagnosis not present

## 2018-01-28 DIAGNOSIS — E1159 Type 2 diabetes mellitus with other circulatory complications: Secondary | ICD-10-CM | POA: Diagnosis not present

## 2018-01-28 DIAGNOSIS — E669 Obesity, unspecified: Secondary | ICD-10-CM | POA: Diagnosis not present

## 2018-01-28 DIAGNOSIS — J449 Chronic obstructive pulmonary disease, unspecified: Secondary | ICD-10-CM | POA: Diagnosis not present

## 2018-01-28 DIAGNOSIS — J9610 Chronic respiratory failure, unspecified whether with hypoxia or hypercapnia: Secondary | ICD-10-CM | POA: Diagnosis not present

## 2018-01-28 DIAGNOSIS — I10 Essential (primary) hypertension: Secondary | ICD-10-CM | POA: Diagnosis not present

## 2018-01-28 DIAGNOSIS — J301 Allergic rhinitis due to pollen: Secondary | ICD-10-CM | POA: Diagnosis not present

## 2018-01-28 DIAGNOSIS — F339 Major depressive disorder, recurrent, unspecified: Secondary | ICD-10-CM | POA: Diagnosis not present

## 2018-01-30 DIAGNOSIS — E1159 Type 2 diabetes mellitus with other circulatory complications: Secondary | ICD-10-CM | POA: Diagnosis not present

## 2018-01-30 DIAGNOSIS — F339 Major depressive disorder, recurrent, unspecified: Secondary | ICD-10-CM | POA: Diagnosis not present

## 2018-01-30 DIAGNOSIS — J449 Chronic obstructive pulmonary disease, unspecified: Secondary | ICD-10-CM | POA: Diagnosis not present

## 2018-01-30 DIAGNOSIS — I509 Heart failure, unspecified: Secondary | ICD-10-CM | POA: Diagnosis not present

## 2018-01-30 DIAGNOSIS — J9610 Chronic respiratory failure, unspecified whether with hypoxia or hypercapnia: Secondary | ICD-10-CM | POA: Diagnosis not present

## 2018-01-30 DIAGNOSIS — E669 Obesity, unspecified: Secondary | ICD-10-CM | POA: Diagnosis not present

## 2018-01-31 DIAGNOSIS — E1159 Type 2 diabetes mellitus with other circulatory complications: Secondary | ICD-10-CM | POA: Diagnosis not present

## 2018-01-31 DIAGNOSIS — E669 Obesity, unspecified: Secondary | ICD-10-CM | POA: Diagnosis not present

## 2018-01-31 DIAGNOSIS — F339 Major depressive disorder, recurrent, unspecified: Secondary | ICD-10-CM | POA: Diagnosis not present

## 2018-01-31 DIAGNOSIS — J449 Chronic obstructive pulmonary disease, unspecified: Secondary | ICD-10-CM | POA: Diagnosis not present

## 2018-01-31 DIAGNOSIS — I509 Heart failure, unspecified: Secondary | ICD-10-CM | POA: Diagnosis not present

## 2018-01-31 DIAGNOSIS — J9610 Chronic respiratory failure, unspecified whether with hypoxia or hypercapnia: Secondary | ICD-10-CM | POA: Diagnosis not present

## 2018-02-02 DIAGNOSIS — F339 Major depressive disorder, recurrent, unspecified: Secondary | ICD-10-CM | POA: Diagnosis not present

## 2018-02-02 DIAGNOSIS — J449 Chronic obstructive pulmonary disease, unspecified: Secondary | ICD-10-CM | POA: Diagnosis not present

## 2018-02-02 DIAGNOSIS — J9610 Chronic respiratory failure, unspecified whether with hypoxia or hypercapnia: Secondary | ICD-10-CM | POA: Diagnosis not present

## 2018-02-02 DIAGNOSIS — E1159 Type 2 diabetes mellitus with other circulatory complications: Secondary | ICD-10-CM | POA: Diagnosis not present

## 2018-02-02 DIAGNOSIS — E669 Obesity, unspecified: Secondary | ICD-10-CM | POA: Diagnosis not present

## 2018-02-02 DIAGNOSIS — I509 Heart failure, unspecified: Secondary | ICD-10-CM | POA: Diagnosis not present

## 2018-02-04 DIAGNOSIS — E669 Obesity, unspecified: Secondary | ICD-10-CM | POA: Diagnosis not present

## 2018-02-04 DIAGNOSIS — E1159 Type 2 diabetes mellitus with other circulatory complications: Secondary | ICD-10-CM | POA: Diagnosis not present

## 2018-02-04 DIAGNOSIS — J9610 Chronic respiratory failure, unspecified whether with hypoxia or hypercapnia: Secondary | ICD-10-CM | POA: Diagnosis not present

## 2018-02-04 DIAGNOSIS — I509 Heart failure, unspecified: Secondary | ICD-10-CM | POA: Diagnosis not present

## 2018-02-04 DIAGNOSIS — F339 Major depressive disorder, recurrent, unspecified: Secondary | ICD-10-CM | POA: Diagnosis not present

## 2018-02-04 DIAGNOSIS — J449 Chronic obstructive pulmonary disease, unspecified: Secondary | ICD-10-CM | POA: Diagnosis not present

## 2018-02-05 DIAGNOSIS — I509 Heart failure, unspecified: Secondary | ICD-10-CM | POA: Diagnosis not present

## 2018-02-05 DIAGNOSIS — E1159 Type 2 diabetes mellitus with other circulatory complications: Secondary | ICD-10-CM | POA: Diagnosis not present

## 2018-02-05 DIAGNOSIS — J449 Chronic obstructive pulmonary disease, unspecified: Secondary | ICD-10-CM | POA: Diagnosis not present

## 2018-02-05 DIAGNOSIS — E669 Obesity, unspecified: Secondary | ICD-10-CM | POA: Diagnosis not present

## 2018-02-05 DIAGNOSIS — J9610 Chronic respiratory failure, unspecified whether with hypoxia or hypercapnia: Secondary | ICD-10-CM | POA: Diagnosis not present

## 2018-02-05 DIAGNOSIS — F339 Major depressive disorder, recurrent, unspecified: Secondary | ICD-10-CM | POA: Diagnosis not present

## 2018-02-06 DIAGNOSIS — I509 Heart failure, unspecified: Secondary | ICD-10-CM | POA: Diagnosis not present

## 2018-02-06 DIAGNOSIS — J9610 Chronic respiratory failure, unspecified whether with hypoxia or hypercapnia: Secondary | ICD-10-CM | POA: Diagnosis not present

## 2018-02-06 DIAGNOSIS — E669 Obesity, unspecified: Secondary | ICD-10-CM | POA: Diagnosis not present

## 2018-02-06 DIAGNOSIS — F339 Major depressive disorder, recurrent, unspecified: Secondary | ICD-10-CM | POA: Diagnosis not present

## 2018-02-06 DIAGNOSIS — J449 Chronic obstructive pulmonary disease, unspecified: Secondary | ICD-10-CM | POA: Diagnosis not present

## 2018-02-06 DIAGNOSIS — E1159 Type 2 diabetes mellitus with other circulatory complications: Secondary | ICD-10-CM | POA: Diagnosis not present

## 2018-02-07 DIAGNOSIS — I509 Heart failure, unspecified: Secondary | ICD-10-CM | POA: Diagnosis not present

## 2018-02-07 DIAGNOSIS — E669 Obesity, unspecified: Secondary | ICD-10-CM | POA: Diagnosis not present

## 2018-02-07 DIAGNOSIS — J449 Chronic obstructive pulmonary disease, unspecified: Secondary | ICD-10-CM | POA: Diagnosis not present

## 2018-02-07 DIAGNOSIS — J9610 Chronic respiratory failure, unspecified whether with hypoxia or hypercapnia: Secondary | ICD-10-CM | POA: Diagnosis not present

## 2018-02-07 DIAGNOSIS — F339 Major depressive disorder, recurrent, unspecified: Secondary | ICD-10-CM | POA: Diagnosis not present

## 2018-02-07 DIAGNOSIS — E1159 Type 2 diabetes mellitus with other circulatory complications: Secondary | ICD-10-CM | POA: Diagnosis not present

## 2018-02-09 DIAGNOSIS — E669 Obesity, unspecified: Secondary | ICD-10-CM | POA: Diagnosis not present

## 2018-02-09 DIAGNOSIS — J9610 Chronic respiratory failure, unspecified whether with hypoxia or hypercapnia: Secondary | ICD-10-CM | POA: Diagnosis not present

## 2018-02-09 DIAGNOSIS — J449 Chronic obstructive pulmonary disease, unspecified: Secondary | ICD-10-CM | POA: Diagnosis not present

## 2018-02-09 DIAGNOSIS — I509 Heart failure, unspecified: Secondary | ICD-10-CM | POA: Diagnosis not present

## 2018-02-09 DIAGNOSIS — F339 Major depressive disorder, recurrent, unspecified: Secondary | ICD-10-CM | POA: Diagnosis not present

## 2018-02-09 DIAGNOSIS — E1159 Type 2 diabetes mellitus with other circulatory complications: Secondary | ICD-10-CM | POA: Diagnosis not present

## 2018-02-12 DIAGNOSIS — E1159 Type 2 diabetes mellitus with other circulatory complications: Secondary | ICD-10-CM | POA: Diagnosis not present

## 2018-02-12 DIAGNOSIS — I509 Heart failure, unspecified: Secondary | ICD-10-CM | POA: Diagnosis not present

## 2018-02-12 DIAGNOSIS — F339 Major depressive disorder, recurrent, unspecified: Secondary | ICD-10-CM | POA: Diagnosis not present

## 2018-02-12 DIAGNOSIS — E669 Obesity, unspecified: Secondary | ICD-10-CM | POA: Diagnosis not present

## 2018-02-12 DIAGNOSIS — J9610 Chronic respiratory failure, unspecified whether with hypoxia or hypercapnia: Secondary | ICD-10-CM | POA: Diagnosis not present

## 2018-02-12 DIAGNOSIS — J449 Chronic obstructive pulmonary disease, unspecified: Secondary | ICD-10-CM | POA: Diagnosis not present

## 2018-02-13 DIAGNOSIS — E669 Obesity, unspecified: Secondary | ICD-10-CM | POA: Diagnosis not present

## 2018-02-13 DIAGNOSIS — E1159 Type 2 diabetes mellitus with other circulatory complications: Secondary | ICD-10-CM | POA: Diagnosis not present

## 2018-02-13 DIAGNOSIS — J9610 Chronic respiratory failure, unspecified whether with hypoxia or hypercapnia: Secondary | ICD-10-CM | POA: Diagnosis not present

## 2018-02-13 DIAGNOSIS — J449 Chronic obstructive pulmonary disease, unspecified: Secondary | ICD-10-CM | POA: Diagnosis not present

## 2018-02-13 DIAGNOSIS — I509 Heart failure, unspecified: Secondary | ICD-10-CM | POA: Diagnosis not present

## 2018-02-13 DIAGNOSIS — F339 Major depressive disorder, recurrent, unspecified: Secondary | ICD-10-CM | POA: Diagnosis not present

## 2018-02-14 DIAGNOSIS — J449 Chronic obstructive pulmonary disease, unspecified: Secondary | ICD-10-CM | POA: Diagnosis not present

## 2018-02-14 DIAGNOSIS — E669 Obesity, unspecified: Secondary | ICD-10-CM | POA: Diagnosis not present

## 2018-02-14 DIAGNOSIS — F339 Major depressive disorder, recurrent, unspecified: Secondary | ICD-10-CM | POA: Diagnosis not present

## 2018-02-14 DIAGNOSIS — I509 Heart failure, unspecified: Secondary | ICD-10-CM | POA: Diagnosis not present

## 2018-02-14 DIAGNOSIS — E1159 Type 2 diabetes mellitus with other circulatory complications: Secondary | ICD-10-CM | POA: Diagnosis not present

## 2018-02-14 DIAGNOSIS — J9610 Chronic respiratory failure, unspecified whether with hypoxia or hypercapnia: Secondary | ICD-10-CM | POA: Diagnosis not present

## 2018-02-16 DIAGNOSIS — J449 Chronic obstructive pulmonary disease, unspecified: Secondary | ICD-10-CM | POA: Diagnosis not present

## 2018-02-16 DIAGNOSIS — F339 Major depressive disorder, recurrent, unspecified: Secondary | ICD-10-CM | POA: Diagnosis not present

## 2018-02-16 DIAGNOSIS — I509 Heart failure, unspecified: Secondary | ICD-10-CM | POA: Diagnosis not present

## 2018-02-16 DIAGNOSIS — E669 Obesity, unspecified: Secondary | ICD-10-CM | POA: Diagnosis not present

## 2018-02-16 DIAGNOSIS — J9610 Chronic respiratory failure, unspecified whether with hypoxia or hypercapnia: Secondary | ICD-10-CM | POA: Diagnosis not present

## 2018-02-16 DIAGNOSIS — E1159 Type 2 diabetes mellitus with other circulatory complications: Secondary | ICD-10-CM | POA: Diagnosis not present

## 2018-02-19 DIAGNOSIS — J449 Chronic obstructive pulmonary disease, unspecified: Secondary | ICD-10-CM | POA: Diagnosis not present

## 2018-02-19 DIAGNOSIS — I509 Heart failure, unspecified: Secondary | ICD-10-CM | POA: Diagnosis not present

## 2018-02-19 DIAGNOSIS — E669 Obesity, unspecified: Secondary | ICD-10-CM | POA: Diagnosis not present

## 2018-02-19 DIAGNOSIS — F339 Major depressive disorder, recurrent, unspecified: Secondary | ICD-10-CM | POA: Diagnosis not present

## 2018-02-19 DIAGNOSIS — E1159 Type 2 diabetes mellitus with other circulatory complications: Secondary | ICD-10-CM | POA: Diagnosis not present

## 2018-02-19 DIAGNOSIS — J9610 Chronic respiratory failure, unspecified whether with hypoxia or hypercapnia: Secondary | ICD-10-CM | POA: Diagnosis not present

## 2018-02-20 DIAGNOSIS — F339 Major depressive disorder, recurrent, unspecified: Secondary | ICD-10-CM | POA: Diagnosis not present

## 2018-02-20 DIAGNOSIS — I509 Heart failure, unspecified: Secondary | ICD-10-CM | POA: Diagnosis not present

## 2018-02-20 DIAGNOSIS — E669 Obesity, unspecified: Secondary | ICD-10-CM | POA: Diagnosis not present

## 2018-02-20 DIAGNOSIS — E1159 Type 2 diabetes mellitus with other circulatory complications: Secondary | ICD-10-CM | POA: Diagnosis not present

## 2018-02-20 DIAGNOSIS — J449 Chronic obstructive pulmonary disease, unspecified: Secondary | ICD-10-CM | POA: Diagnosis not present

## 2018-02-20 DIAGNOSIS — J9610 Chronic respiratory failure, unspecified whether with hypoxia or hypercapnia: Secondary | ICD-10-CM | POA: Diagnosis not present

## 2018-02-21 DIAGNOSIS — E1159 Type 2 diabetes mellitus with other circulatory complications: Secondary | ICD-10-CM | POA: Diagnosis not present

## 2018-02-21 DIAGNOSIS — I509 Heart failure, unspecified: Secondary | ICD-10-CM | POA: Diagnosis not present

## 2018-02-21 DIAGNOSIS — F339 Major depressive disorder, recurrent, unspecified: Secondary | ICD-10-CM | POA: Diagnosis not present

## 2018-02-21 DIAGNOSIS — J9610 Chronic respiratory failure, unspecified whether with hypoxia or hypercapnia: Secondary | ICD-10-CM | POA: Diagnosis not present

## 2018-02-21 DIAGNOSIS — J449 Chronic obstructive pulmonary disease, unspecified: Secondary | ICD-10-CM | POA: Diagnosis not present

## 2018-02-21 DIAGNOSIS — E669 Obesity, unspecified: Secondary | ICD-10-CM | POA: Diagnosis not present

## 2018-02-23 DIAGNOSIS — J9610 Chronic respiratory failure, unspecified whether with hypoxia or hypercapnia: Secondary | ICD-10-CM | POA: Diagnosis not present

## 2018-02-23 DIAGNOSIS — E669 Obesity, unspecified: Secondary | ICD-10-CM | POA: Diagnosis not present

## 2018-02-23 DIAGNOSIS — F339 Major depressive disorder, recurrent, unspecified: Secondary | ICD-10-CM | POA: Diagnosis not present

## 2018-02-23 DIAGNOSIS — I509 Heart failure, unspecified: Secondary | ICD-10-CM | POA: Diagnosis not present

## 2018-02-23 DIAGNOSIS — E1159 Type 2 diabetes mellitus with other circulatory complications: Secondary | ICD-10-CM | POA: Diagnosis not present

## 2018-02-23 DIAGNOSIS — J449 Chronic obstructive pulmonary disease, unspecified: Secondary | ICD-10-CM | POA: Diagnosis not present

## 2018-02-26 DIAGNOSIS — E669 Obesity, unspecified: Secondary | ICD-10-CM | POA: Diagnosis not present

## 2018-02-26 DIAGNOSIS — E1159 Type 2 diabetes mellitus with other circulatory complications: Secondary | ICD-10-CM | POA: Diagnosis not present

## 2018-02-26 DIAGNOSIS — I509 Heart failure, unspecified: Secondary | ICD-10-CM | POA: Diagnosis not present

## 2018-02-26 DIAGNOSIS — J9610 Chronic respiratory failure, unspecified whether with hypoxia or hypercapnia: Secondary | ICD-10-CM | POA: Diagnosis not present

## 2018-02-26 DIAGNOSIS — F339 Major depressive disorder, recurrent, unspecified: Secondary | ICD-10-CM | POA: Diagnosis not present

## 2018-02-26 DIAGNOSIS — J449 Chronic obstructive pulmonary disease, unspecified: Secondary | ICD-10-CM | POA: Diagnosis not present

## 2018-02-27 DIAGNOSIS — E1159 Type 2 diabetes mellitus with other circulatory complications: Secondary | ICD-10-CM | POA: Diagnosis not present

## 2018-02-27 DIAGNOSIS — J301 Allergic rhinitis due to pollen: Secondary | ICD-10-CM | POA: Diagnosis not present

## 2018-02-27 DIAGNOSIS — J449 Chronic obstructive pulmonary disease, unspecified: Secondary | ICD-10-CM | POA: Diagnosis not present

## 2018-02-27 DIAGNOSIS — E669 Obesity, unspecified: Secondary | ICD-10-CM | POA: Diagnosis not present

## 2018-02-27 DIAGNOSIS — I509 Heart failure, unspecified: Secondary | ICD-10-CM | POA: Diagnosis not present

## 2018-02-27 DIAGNOSIS — K219 Gastro-esophageal reflux disease without esophagitis: Secondary | ICD-10-CM | POA: Diagnosis not present

## 2018-02-27 DIAGNOSIS — F339 Major depressive disorder, recurrent, unspecified: Secondary | ICD-10-CM | POA: Diagnosis not present

## 2018-02-27 DIAGNOSIS — J9610 Chronic respiratory failure, unspecified whether with hypoxia or hypercapnia: Secondary | ICD-10-CM | POA: Diagnosis not present

## 2018-02-27 DIAGNOSIS — N189 Chronic kidney disease, unspecified: Secondary | ICD-10-CM | POA: Diagnosis not present

## 2018-02-27 DIAGNOSIS — I10 Essential (primary) hypertension: Secondary | ICD-10-CM | POA: Diagnosis not present

## 2018-02-28 DIAGNOSIS — J9610 Chronic respiratory failure, unspecified whether with hypoxia or hypercapnia: Secondary | ICD-10-CM | POA: Diagnosis not present

## 2018-02-28 DIAGNOSIS — J449 Chronic obstructive pulmonary disease, unspecified: Secondary | ICD-10-CM | POA: Diagnosis not present

## 2018-02-28 DIAGNOSIS — E1159 Type 2 diabetes mellitus with other circulatory complications: Secondary | ICD-10-CM | POA: Diagnosis not present

## 2018-02-28 DIAGNOSIS — F339 Major depressive disorder, recurrent, unspecified: Secondary | ICD-10-CM | POA: Diagnosis not present

## 2018-02-28 DIAGNOSIS — E669 Obesity, unspecified: Secondary | ICD-10-CM | POA: Diagnosis not present

## 2018-02-28 DIAGNOSIS — I509 Heart failure, unspecified: Secondary | ICD-10-CM | POA: Diagnosis not present

## 2018-03-01 DIAGNOSIS — J9610 Chronic respiratory failure, unspecified whether with hypoxia or hypercapnia: Secondary | ICD-10-CM | POA: Diagnosis not present

## 2018-03-01 DIAGNOSIS — E669 Obesity, unspecified: Secondary | ICD-10-CM | POA: Diagnosis not present

## 2018-03-01 DIAGNOSIS — F339 Major depressive disorder, recurrent, unspecified: Secondary | ICD-10-CM | POA: Diagnosis not present

## 2018-03-01 DIAGNOSIS — I509 Heart failure, unspecified: Secondary | ICD-10-CM | POA: Diagnosis not present

## 2018-03-01 DIAGNOSIS — E1159 Type 2 diabetes mellitus with other circulatory complications: Secondary | ICD-10-CM | POA: Diagnosis not present

## 2018-03-01 DIAGNOSIS — J449 Chronic obstructive pulmonary disease, unspecified: Secondary | ICD-10-CM | POA: Diagnosis not present

## 2018-03-02 DIAGNOSIS — E1159 Type 2 diabetes mellitus with other circulatory complications: Secondary | ICD-10-CM | POA: Diagnosis not present

## 2018-03-02 DIAGNOSIS — E669 Obesity, unspecified: Secondary | ICD-10-CM | POA: Diagnosis not present

## 2018-03-02 DIAGNOSIS — I509 Heart failure, unspecified: Secondary | ICD-10-CM | POA: Diagnosis not present

## 2018-03-02 DIAGNOSIS — J449 Chronic obstructive pulmonary disease, unspecified: Secondary | ICD-10-CM | POA: Diagnosis not present

## 2018-03-02 DIAGNOSIS — J9610 Chronic respiratory failure, unspecified whether with hypoxia or hypercapnia: Secondary | ICD-10-CM | POA: Diagnosis not present

## 2018-03-02 DIAGNOSIS — F339 Major depressive disorder, recurrent, unspecified: Secondary | ICD-10-CM | POA: Diagnosis not present

## 2018-03-05 DIAGNOSIS — J9610 Chronic respiratory failure, unspecified whether with hypoxia or hypercapnia: Secondary | ICD-10-CM | POA: Diagnosis not present

## 2018-03-05 DIAGNOSIS — I509 Heart failure, unspecified: Secondary | ICD-10-CM | POA: Diagnosis not present

## 2018-03-05 DIAGNOSIS — F339 Major depressive disorder, recurrent, unspecified: Secondary | ICD-10-CM | POA: Diagnosis not present

## 2018-03-05 DIAGNOSIS — E669 Obesity, unspecified: Secondary | ICD-10-CM | POA: Diagnosis not present

## 2018-03-05 DIAGNOSIS — J449 Chronic obstructive pulmonary disease, unspecified: Secondary | ICD-10-CM | POA: Diagnosis not present

## 2018-03-05 DIAGNOSIS — E1159 Type 2 diabetes mellitus with other circulatory complications: Secondary | ICD-10-CM | POA: Diagnosis not present

## 2018-03-06 DIAGNOSIS — F339 Major depressive disorder, recurrent, unspecified: Secondary | ICD-10-CM | POA: Diagnosis not present

## 2018-03-06 DIAGNOSIS — J9610 Chronic respiratory failure, unspecified whether with hypoxia or hypercapnia: Secondary | ICD-10-CM | POA: Diagnosis not present

## 2018-03-06 DIAGNOSIS — I509 Heart failure, unspecified: Secondary | ICD-10-CM | POA: Diagnosis not present

## 2018-03-06 DIAGNOSIS — J449 Chronic obstructive pulmonary disease, unspecified: Secondary | ICD-10-CM | POA: Diagnosis not present

## 2018-03-06 DIAGNOSIS — E1159 Type 2 diabetes mellitus with other circulatory complications: Secondary | ICD-10-CM | POA: Diagnosis not present

## 2018-03-06 DIAGNOSIS — E669 Obesity, unspecified: Secondary | ICD-10-CM | POA: Diagnosis not present

## 2018-03-07 DIAGNOSIS — E669 Obesity, unspecified: Secondary | ICD-10-CM | POA: Diagnosis not present

## 2018-03-07 DIAGNOSIS — E1159 Type 2 diabetes mellitus with other circulatory complications: Secondary | ICD-10-CM | POA: Diagnosis not present

## 2018-03-07 DIAGNOSIS — F339 Major depressive disorder, recurrent, unspecified: Secondary | ICD-10-CM | POA: Diagnosis not present

## 2018-03-07 DIAGNOSIS — J449 Chronic obstructive pulmonary disease, unspecified: Secondary | ICD-10-CM | POA: Diagnosis not present

## 2018-03-07 DIAGNOSIS — J9610 Chronic respiratory failure, unspecified whether with hypoxia or hypercapnia: Secondary | ICD-10-CM | POA: Diagnosis not present

## 2018-03-07 DIAGNOSIS — I509 Heart failure, unspecified: Secondary | ICD-10-CM | POA: Diagnosis not present

## 2018-03-09 DIAGNOSIS — J449 Chronic obstructive pulmonary disease, unspecified: Secondary | ICD-10-CM | POA: Diagnosis not present

## 2018-03-09 DIAGNOSIS — F339 Major depressive disorder, recurrent, unspecified: Secondary | ICD-10-CM | POA: Diagnosis not present

## 2018-03-09 DIAGNOSIS — I509 Heart failure, unspecified: Secondary | ICD-10-CM | POA: Diagnosis not present

## 2018-03-09 DIAGNOSIS — E1159 Type 2 diabetes mellitus with other circulatory complications: Secondary | ICD-10-CM | POA: Diagnosis not present

## 2018-03-09 DIAGNOSIS — J9610 Chronic respiratory failure, unspecified whether with hypoxia or hypercapnia: Secondary | ICD-10-CM | POA: Diagnosis not present

## 2018-03-09 DIAGNOSIS — E669 Obesity, unspecified: Secondary | ICD-10-CM | POA: Diagnosis not present

## 2018-03-12 DIAGNOSIS — F339 Major depressive disorder, recurrent, unspecified: Secondary | ICD-10-CM | POA: Diagnosis not present

## 2018-03-12 DIAGNOSIS — E1159 Type 2 diabetes mellitus with other circulatory complications: Secondary | ICD-10-CM | POA: Diagnosis not present

## 2018-03-12 DIAGNOSIS — J9610 Chronic respiratory failure, unspecified whether with hypoxia or hypercapnia: Secondary | ICD-10-CM | POA: Diagnosis not present

## 2018-03-12 DIAGNOSIS — I509 Heart failure, unspecified: Secondary | ICD-10-CM | POA: Diagnosis not present

## 2018-03-12 DIAGNOSIS — J449 Chronic obstructive pulmonary disease, unspecified: Secondary | ICD-10-CM | POA: Diagnosis not present

## 2018-03-12 DIAGNOSIS — E669 Obesity, unspecified: Secondary | ICD-10-CM | POA: Diagnosis not present

## 2018-03-13 DIAGNOSIS — J449 Chronic obstructive pulmonary disease, unspecified: Secondary | ICD-10-CM | POA: Diagnosis not present

## 2018-03-13 DIAGNOSIS — F339 Major depressive disorder, recurrent, unspecified: Secondary | ICD-10-CM | POA: Diagnosis not present

## 2018-03-13 DIAGNOSIS — E669 Obesity, unspecified: Secondary | ICD-10-CM | POA: Diagnosis not present

## 2018-03-13 DIAGNOSIS — I509 Heart failure, unspecified: Secondary | ICD-10-CM | POA: Diagnosis not present

## 2018-03-13 DIAGNOSIS — J9610 Chronic respiratory failure, unspecified whether with hypoxia or hypercapnia: Secondary | ICD-10-CM | POA: Diagnosis not present

## 2018-03-13 DIAGNOSIS — E1159 Type 2 diabetes mellitus with other circulatory complications: Secondary | ICD-10-CM | POA: Diagnosis not present

## 2018-03-14 DIAGNOSIS — E669 Obesity, unspecified: Secondary | ICD-10-CM | POA: Diagnosis not present

## 2018-03-14 DIAGNOSIS — F339 Major depressive disorder, recurrent, unspecified: Secondary | ICD-10-CM | POA: Diagnosis not present

## 2018-03-14 DIAGNOSIS — E1159 Type 2 diabetes mellitus with other circulatory complications: Secondary | ICD-10-CM | POA: Diagnosis not present

## 2018-03-14 DIAGNOSIS — J9610 Chronic respiratory failure, unspecified whether with hypoxia or hypercapnia: Secondary | ICD-10-CM | POA: Diagnosis not present

## 2018-03-14 DIAGNOSIS — I509 Heart failure, unspecified: Secondary | ICD-10-CM | POA: Diagnosis not present

## 2018-03-14 DIAGNOSIS — J449 Chronic obstructive pulmonary disease, unspecified: Secondary | ICD-10-CM | POA: Diagnosis not present

## 2018-03-16 DIAGNOSIS — E1159 Type 2 diabetes mellitus with other circulatory complications: Secondary | ICD-10-CM | POA: Diagnosis not present

## 2018-03-16 DIAGNOSIS — I509 Heart failure, unspecified: Secondary | ICD-10-CM | POA: Diagnosis not present

## 2018-03-16 DIAGNOSIS — E669 Obesity, unspecified: Secondary | ICD-10-CM | POA: Diagnosis not present

## 2018-03-16 DIAGNOSIS — F339 Major depressive disorder, recurrent, unspecified: Secondary | ICD-10-CM | POA: Diagnosis not present

## 2018-03-16 DIAGNOSIS — J449 Chronic obstructive pulmonary disease, unspecified: Secondary | ICD-10-CM | POA: Diagnosis not present

## 2018-03-16 DIAGNOSIS — J9610 Chronic respiratory failure, unspecified whether with hypoxia or hypercapnia: Secondary | ICD-10-CM | POA: Diagnosis not present

## 2018-03-19 DIAGNOSIS — J9610 Chronic respiratory failure, unspecified whether with hypoxia or hypercapnia: Secondary | ICD-10-CM | POA: Diagnosis not present

## 2018-03-19 DIAGNOSIS — F339 Major depressive disorder, recurrent, unspecified: Secondary | ICD-10-CM | POA: Diagnosis not present

## 2018-03-19 DIAGNOSIS — I509 Heart failure, unspecified: Secondary | ICD-10-CM | POA: Diagnosis not present

## 2018-03-19 DIAGNOSIS — E1159 Type 2 diabetes mellitus with other circulatory complications: Secondary | ICD-10-CM | POA: Diagnosis not present

## 2018-03-19 DIAGNOSIS — E669 Obesity, unspecified: Secondary | ICD-10-CM | POA: Diagnosis not present

## 2018-03-19 DIAGNOSIS — J449 Chronic obstructive pulmonary disease, unspecified: Secondary | ICD-10-CM | POA: Diagnosis not present

## 2018-03-20 DIAGNOSIS — E1151 Type 2 diabetes mellitus with diabetic peripheral angiopathy without gangrene: Secondary | ICD-10-CM | POA: Diagnosis not present

## 2018-03-20 DIAGNOSIS — E1159 Type 2 diabetes mellitus with other circulatory complications: Secondary | ICD-10-CM | POA: Diagnosis not present

## 2018-03-20 DIAGNOSIS — L84 Corns and callosities: Secondary | ICD-10-CM | POA: Diagnosis not present

## 2018-03-20 DIAGNOSIS — E669 Obesity, unspecified: Secondary | ICD-10-CM | POA: Diagnosis not present

## 2018-03-20 DIAGNOSIS — I509 Heart failure, unspecified: Secondary | ICD-10-CM | POA: Diagnosis not present

## 2018-03-20 DIAGNOSIS — J9610 Chronic respiratory failure, unspecified whether with hypoxia or hypercapnia: Secondary | ICD-10-CM | POA: Diagnosis not present

## 2018-03-20 DIAGNOSIS — M79675 Pain in left toe(s): Secondary | ICD-10-CM | POA: Diagnosis not present

## 2018-03-20 DIAGNOSIS — M2041 Other hammer toe(s) (acquired), right foot: Secondary | ICD-10-CM | POA: Diagnosis not present

## 2018-03-20 DIAGNOSIS — J449 Chronic obstructive pulmonary disease, unspecified: Secondary | ICD-10-CM | POA: Diagnosis not present

## 2018-03-20 DIAGNOSIS — F339 Major depressive disorder, recurrent, unspecified: Secondary | ICD-10-CM | POA: Diagnosis not present

## 2018-03-20 DIAGNOSIS — B351 Tinea unguium: Secondary | ICD-10-CM | POA: Diagnosis not present

## 2018-03-21 DIAGNOSIS — E669 Obesity, unspecified: Secondary | ICD-10-CM | POA: Diagnosis not present

## 2018-03-21 DIAGNOSIS — J9610 Chronic respiratory failure, unspecified whether with hypoxia or hypercapnia: Secondary | ICD-10-CM | POA: Diagnosis not present

## 2018-03-21 DIAGNOSIS — J449 Chronic obstructive pulmonary disease, unspecified: Secondary | ICD-10-CM | POA: Diagnosis not present

## 2018-03-21 DIAGNOSIS — F339 Major depressive disorder, recurrent, unspecified: Secondary | ICD-10-CM | POA: Diagnosis not present

## 2018-03-21 DIAGNOSIS — I509 Heart failure, unspecified: Secondary | ICD-10-CM | POA: Diagnosis not present

## 2018-03-21 DIAGNOSIS — E1159 Type 2 diabetes mellitus with other circulatory complications: Secondary | ICD-10-CM | POA: Diagnosis not present

## 2018-03-23 DIAGNOSIS — I509 Heart failure, unspecified: Secondary | ICD-10-CM | POA: Diagnosis not present

## 2018-03-23 DIAGNOSIS — F339 Major depressive disorder, recurrent, unspecified: Secondary | ICD-10-CM | POA: Diagnosis not present

## 2018-03-23 DIAGNOSIS — J9610 Chronic respiratory failure, unspecified whether with hypoxia or hypercapnia: Secondary | ICD-10-CM | POA: Diagnosis not present

## 2018-03-23 DIAGNOSIS — E669 Obesity, unspecified: Secondary | ICD-10-CM | POA: Diagnosis not present

## 2018-03-23 DIAGNOSIS — J449 Chronic obstructive pulmonary disease, unspecified: Secondary | ICD-10-CM | POA: Diagnosis not present

## 2018-03-23 DIAGNOSIS — E1159 Type 2 diabetes mellitus with other circulatory complications: Secondary | ICD-10-CM | POA: Diagnosis not present

## 2018-03-26 DIAGNOSIS — I509 Heart failure, unspecified: Secondary | ICD-10-CM | POA: Diagnosis not present

## 2018-03-26 DIAGNOSIS — F339 Major depressive disorder, recurrent, unspecified: Secondary | ICD-10-CM | POA: Diagnosis not present

## 2018-03-26 DIAGNOSIS — E1159 Type 2 diabetes mellitus with other circulatory complications: Secondary | ICD-10-CM | POA: Diagnosis not present

## 2018-03-26 DIAGNOSIS — J9610 Chronic respiratory failure, unspecified whether with hypoxia or hypercapnia: Secondary | ICD-10-CM | POA: Diagnosis not present

## 2018-03-26 DIAGNOSIS — J449 Chronic obstructive pulmonary disease, unspecified: Secondary | ICD-10-CM | POA: Diagnosis not present

## 2018-03-26 DIAGNOSIS — E669 Obesity, unspecified: Secondary | ICD-10-CM | POA: Diagnosis not present

## 2018-03-27 DIAGNOSIS — J9610 Chronic respiratory failure, unspecified whether with hypoxia or hypercapnia: Secondary | ICD-10-CM | POA: Diagnosis not present

## 2018-03-27 DIAGNOSIS — J449 Chronic obstructive pulmonary disease, unspecified: Secondary | ICD-10-CM | POA: Diagnosis not present

## 2018-03-27 DIAGNOSIS — E669 Obesity, unspecified: Secondary | ICD-10-CM | POA: Diagnosis not present

## 2018-03-27 DIAGNOSIS — F339 Major depressive disorder, recurrent, unspecified: Secondary | ICD-10-CM | POA: Diagnosis not present

## 2018-03-27 DIAGNOSIS — E1159 Type 2 diabetes mellitus with other circulatory complications: Secondary | ICD-10-CM | POA: Diagnosis not present

## 2018-03-27 DIAGNOSIS — I509 Heart failure, unspecified: Secondary | ICD-10-CM | POA: Diagnosis not present

## 2018-03-28 DIAGNOSIS — E669 Obesity, unspecified: Secondary | ICD-10-CM | POA: Diagnosis not present

## 2018-03-28 DIAGNOSIS — J449 Chronic obstructive pulmonary disease, unspecified: Secondary | ICD-10-CM | POA: Diagnosis not present

## 2018-03-28 DIAGNOSIS — I509 Heart failure, unspecified: Secondary | ICD-10-CM | POA: Diagnosis not present

## 2018-03-28 DIAGNOSIS — F339 Major depressive disorder, recurrent, unspecified: Secondary | ICD-10-CM | POA: Diagnosis not present

## 2018-03-28 DIAGNOSIS — E1159 Type 2 diabetes mellitus with other circulatory complications: Secondary | ICD-10-CM | POA: Diagnosis not present

## 2018-03-28 DIAGNOSIS — J9610 Chronic respiratory failure, unspecified whether with hypoxia or hypercapnia: Secondary | ICD-10-CM | POA: Diagnosis not present

## 2018-03-30 DIAGNOSIS — E669 Obesity, unspecified: Secondary | ICD-10-CM | POA: Diagnosis not present

## 2018-03-30 DIAGNOSIS — J449 Chronic obstructive pulmonary disease, unspecified: Secondary | ICD-10-CM | POA: Diagnosis not present

## 2018-03-30 DIAGNOSIS — N189 Chronic kidney disease, unspecified: Secondary | ICD-10-CM | POA: Diagnosis not present

## 2018-03-30 DIAGNOSIS — J9610 Chronic respiratory failure, unspecified whether with hypoxia or hypercapnia: Secondary | ICD-10-CM | POA: Diagnosis not present

## 2018-03-30 DIAGNOSIS — E1159 Type 2 diabetes mellitus with other circulatory complications: Secondary | ICD-10-CM | POA: Diagnosis not present

## 2018-03-30 DIAGNOSIS — I10 Essential (primary) hypertension: Secondary | ICD-10-CM | POA: Diagnosis not present

## 2018-03-30 DIAGNOSIS — J301 Allergic rhinitis due to pollen: Secondary | ICD-10-CM | POA: Diagnosis not present

## 2018-03-30 DIAGNOSIS — I509 Heart failure, unspecified: Secondary | ICD-10-CM | POA: Diagnosis not present

## 2018-03-30 DIAGNOSIS — K219 Gastro-esophageal reflux disease without esophagitis: Secondary | ICD-10-CM | POA: Diagnosis not present

## 2018-03-30 DIAGNOSIS — F339 Major depressive disorder, recurrent, unspecified: Secondary | ICD-10-CM | POA: Diagnosis not present

## 2018-04-02 DIAGNOSIS — J9610 Chronic respiratory failure, unspecified whether with hypoxia or hypercapnia: Secondary | ICD-10-CM | POA: Diagnosis not present

## 2018-04-02 DIAGNOSIS — I509 Heart failure, unspecified: Secondary | ICD-10-CM | POA: Diagnosis not present

## 2018-04-02 DIAGNOSIS — J449 Chronic obstructive pulmonary disease, unspecified: Secondary | ICD-10-CM | POA: Diagnosis not present

## 2018-04-02 DIAGNOSIS — F339 Major depressive disorder, recurrent, unspecified: Secondary | ICD-10-CM | POA: Diagnosis not present

## 2018-04-02 DIAGNOSIS — E1159 Type 2 diabetes mellitus with other circulatory complications: Secondary | ICD-10-CM | POA: Diagnosis not present

## 2018-04-02 DIAGNOSIS — E669 Obesity, unspecified: Secondary | ICD-10-CM | POA: Diagnosis not present

## 2018-04-04 DIAGNOSIS — J449 Chronic obstructive pulmonary disease, unspecified: Secondary | ICD-10-CM | POA: Diagnosis not present

## 2018-04-04 DIAGNOSIS — F339 Major depressive disorder, recurrent, unspecified: Secondary | ICD-10-CM | POA: Diagnosis not present

## 2018-04-04 DIAGNOSIS — E669 Obesity, unspecified: Secondary | ICD-10-CM | POA: Diagnosis not present

## 2018-04-04 DIAGNOSIS — E1159 Type 2 diabetes mellitus with other circulatory complications: Secondary | ICD-10-CM | POA: Diagnosis not present

## 2018-04-04 DIAGNOSIS — I509 Heart failure, unspecified: Secondary | ICD-10-CM | POA: Diagnosis not present

## 2018-04-04 DIAGNOSIS — J9610 Chronic respiratory failure, unspecified whether with hypoxia or hypercapnia: Secondary | ICD-10-CM | POA: Diagnosis not present

## 2018-04-06 DIAGNOSIS — I509 Heart failure, unspecified: Secondary | ICD-10-CM | POA: Diagnosis not present

## 2018-04-06 DIAGNOSIS — J449 Chronic obstructive pulmonary disease, unspecified: Secondary | ICD-10-CM | POA: Diagnosis not present

## 2018-04-06 DIAGNOSIS — J9610 Chronic respiratory failure, unspecified whether with hypoxia or hypercapnia: Secondary | ICD-10-CM | POA: Diagnosis not present

## 2018-04-06 DIAGNOSIS — F339 Major depressive disorder, recurrent, unspecified: Secondary | ICD-10-CM | POA: Diagnosis not present

## 2018-04-06 DIAGNOSIS — E669 Obesity, unspecified: Secondary | ICD-10-CM | POA: Diagnosis not present

## 2018-04-06 DIAGNOSIS — E1159 Type 2 diabetes mellitus with other circulatory complications: Secondary | ICD-10-CM | POA: Diagnosis not present

## 2018-04-09 DIAGNOSIS — E669 Obesity, unspecified: Secondary | ICD-10-CM | POA: Diagnosis not present

## 2018-04-09 DIAGNOSIS — E1159 Type 2 diabetes mellitus with other circulatory complications: Secondary | ICD-10-CM | POA: Diagnosis not present

## 2018-04-09 DIAGNOSIS — I509 Heart failure, unspecified: Secondary | ICD-10-CM | POA: Diagnosis not present

## 2018-04-09 DIAGNOSIS — J9610 Chronic respiratory failure, unspecified whether with hypoxia or hypercapnia: Secondary | ICD-10-CM | POA: Diagnosis not present

## 2018-04-09 DIAGNOSIS — J449 Chronic obstructive pulmonary disease, unspecified: Secondary | ICD-10-CM | POA: Diagnosis not present

## 2018-04-09 DIAGNOSIS — F339 Major depressive disorder, recurrent, unspecified: Secondary | ICD-10-CM | POA: Diagnosis not present

## 2018-04-11 DIAGNOSIS — J449 Chronic obstructive pulmonary disease, unspecified: Secondary | ICD-10-CM | POA: Diagnosis not present

## 2018-04-11 DIAGNOSIS — E1159 Type 2 diabetes mellitus with other circulatory complications: Secondary | ICD-10-CM | POA: Diagnosis not present

## 2018-04-11 DIAGNOSIS — J9610 Chronic respiratory failure, unspecified whether with hypoxia or hypercapnia: Secondary | ICD-10-CM | POA: Diagnosis not present

## 2018-04-11 DIAGNOSIS — I509 Heart failure, unspecified: Secondary | ICD-10-CM | POA: Diagnosis not present

## 2018-04-11 DIAGNOSIS — F339 Major depressive disorder, recurrent, unspecified: Secondary | ICD-10-CM | POA: Diagnosis not present

## 2018-04-11 DIAGNOSIS — E669 Obesity, unspecified: Secondary | ICD-10-CM | POA: Diagnosis not present

## 2018-04-13 DIAGNOSIS — E1159 Type 2 diabetes mellitus with other circulatory complications: Secondary | ICD-10-CM | POA: Diagnosis not present

## 2018-04-13 DIAGNOSIS — J9610 Chronic respiratory failure, unspecified whether with hypoxia or hypercapnia: Secondary | ICD-10-CM | POA: Diagnosis not present

## 2018-04-13 DIAGNOSIS — I509 Heart failure, unspecified: Secondary | ICD-10-CM | POA: Diagnosis not present

## 2018-04-13 DIAGNOSIS — J449 Chronic obstructive pulmonary disease, unspecified: Secondary | ICD-10-CM | POA: Diagnosis not present

## 2018-04-13 DIAGNOSIS — F339 Major depressive disorder, recurrent, unspecified: Secondary | ICD-10-CM | POA: Diagnosis not present

## 2018-04-13 DIAGNOSIS — E669 Obesity, unspecified: Secondary | ICD-10-CM | POA: Diagnosis not present

## 2018-04-16 DIAGNOSIS — J449 Chronic obstructive pulmonary disease, unspecified: Secondary | ICD-10-CM | POA: Diagnosis not present

## 2018-04-16 DIAGNOSIS — E1159 Type 2 diabetes mellitus with other circulatory complications: Secondary | ICD-10-CM | POA: Diagnosis not present

## 2018-04-16 DIAGNOSIS — E669 Obesity, unspecified: Secondary | ICD-10-CM | POA: Diagnosis not present

## 2018-04-16 DIAGNOSIS — J9610 Chronic respiratory failure, unspecified whether with hypoxia or hypercapnia: Secondary | ICD-10-CM | POA: Diagnosis not present

## 2018-04-16 DIAGNOSIS — I509 Heart failure, unspecified: Secondary | ICD-10-CM | POA: Diagnosis not present

## 2018-04-16 DIAGNOSIS — F339 Major depressive disorder, recurrent, unspecified: Secondary | ICD-10-CM | POA: Diagnosis not present

## 2018-04-17 DIAGNOSIS — E669 Obesity, unspecified: Secondary | ICD-10-CM | POA: Diagnosis not present

## 2018-04-17 DIAGNOSIS — E1159 Type 2 diabetes mellitus with other circulatory complications: Secondary | ICD-10-CM | POA: Diagnosis not present

## 2018-04-17 DIAGNOSIS — F339 Major depressive disorder, recurrent, unspecified: Secondary | ICD-10-CM | POA: Diagnosis not present

## 2018-04-17 DIAGNOSIS — I509 Heart failure, unspecified: Secondary | ICD-10-CM | POA: Diagnosis not present

## 2018-04-17 DIAGNOSIS — J9610 Chronic respiratory failure, unspecified whether with hypoxia or hypercapnia: Secondary | ICD-10-CM | POA: Diagnosis not present

## 2018-04-17 DIAGNOSIS — J449 Chronic obstructive pulmonary disease, unspecified: Secondary | ICD-10-CM | POA: Diagnosis not present

## 2018-04-18 DIAGNOSIS — E669 Obesity, unspecified: Secondary | ICD-10-CM | POA: Diagnosis not present

## 2018-04-18 DIAGNOSIS — I509 Heart failure, unspecified: Secondary | ICD-10-CM | POA: Diagnosis not present

## 2018-04-18 DIAGNOSIS — J9610 Chronic respiratory failure, unspecified whether with hypoxia or hypercapnia: Secondary | ICD-10-CM | POA: Diagnosis not present

## 2018-04-18 DIAGNOSIS — J449 Chronic obstructive pulmonary disease, unspecified: Secondary | ICD-10-CM | POA: Diagnosis not present

## 2018-04-18 DIAGNOSIS — E1159 Type 2 diabetes mellitus with other circulatory complications: Secondary | ICD-10-CM | POA: Diagnosis not present

## 2018-04-18 DIAGNOSIS — F339 Major depressive disorder, recurrent, unspecified: Secondary | ICD-10-CM | POA: Diagnosis not present

## 2018-04-20 DIAGNOSIS — E1159 Type 2 diabetes mellitus with other circulatory complications: Secondary | ICD-10-CM | POA: Diagnosis not present

## 2018-04-20 DIAGNOSIS — F339 Major depressive disorder, recurrent, unspecified: Secondary | ICD-10-CM | POA: Diagnosis not present

## 2018-04-20 DIAGNOSIS — J449 Chronic obstructive pulmonary disease, unspecified: Secondary | ICD-10-CM | POA: Diagnosis not present

## 2018-04-20 DIAGNOSIS — J9610 Chronic respiratory failure, unspecified whether with hypoxia or hypercapnia: Secondary | ICD-10-CM | POA: Diagnosis not present

## 2018-04-20 DIAGNOSIS — E669 Obesity, unspecified: Secondary | ICD-10-CM | POA: Diagnosis not present

## 2018-04-20 DIAGNOSIS — I509 Heart failure, unspecified: Secondary | ICD-10-CM | POA: Diagnosis not present

## 2018-04-23 ENCOUNTER — Telehealth: Payer: Self-pay | Admitting: Pulmonary Disease

## 2018-04-23 DIAGNOSIS — J9610 Chronic respiratory failure, unspecified whether with hypoxia or hypercapnia: Secondary | ICD-10-CM | POA: Diagnosis not present

## 2018-04-23 DIAGNOSIS — J449 Chronic obstructive pulmonary disease, unspecified: Secondary | ICD-10-CM | POA: Diagnosis not present

## 2018-04-23 DIAGNOSIS — G4733 Obstructive sleep apnea (adult) (pediatric): Secondary | ICD-10-CM

## 2018-04-23 DIAGNOSIS — F339 Major depressive disorder, recurrent, unspecified: Secondary | ICD-10-CM | POA: Diagnosis not present

## 2018-04-23 DIAGNOSIS — E669 Obesity, unspecified: Secondary | ICD-10-CM | POA: Diagnosis not present

## 2018-04-23 DIAGNOSIS — E1159 Type 2 diabetes mellitus with other circulatory complications: Secondary | ICD-10-CM | POA: Diagnosis not present

## 2018-04-23 DIAGNOSIS — I509 Heart failure, unspecified: Secondary | ICD-10-CM | POA: Diagnosis not present

## 2018-04-23 NOTE — Telephone Encounter (Signed)
Called Makayla Rodriguez, unable to reach left message to give [place] a call back.

## 2018-04-23 NOTE — Telephone Encounter (Signed)
Makayla Rodriguez returning call CB# [phone]

## 2018-04-25 DIAGNOSIS — E669 Obesity, unspecified: Secondary | ICD-10-CM | POA: Diagnosis not present

## 2018-04-25 DIAGNOSIS — J9610 Chronic respiratory failure, unspecified whether with hypoxia or hypercapnia: Secondary | ICD-10-CM | POA: Diagnosis not present

## 2018-04-25 DIAGNOSIS — I509 Heart failure, unspecified: Secondary | ICD-10-CM | POA: Diagnosis not present

## 2018-04-25 DIAGNOSIS — F339 Major depressive disorder, recurrent, unspecified: Secondary | ICD-10-CM | POA: Diagnosis not present

## 2018-04-25 DIAGNOSIS — E1159 Type 2 diabetes mellitus with other circulatory complications: Secondary | ICD-10-CM | POA: Diagnosis not present

## 2018-04-25 DIAGNOSIS — J449 Chronic obstructive pulmonary disease, unspecified: Secondary | ICD-10-CM | POA: Diagnosis not present

## 2018-04-25 NOTE — Telephone Encounter (Signed)
Called patient unable to reach left message to give us a call back.

## 2018-04-27 DIAGNOSIS — E669 Obesity, unspecified: Secondary | ICD-10-CM | POA: Diagnosis not present

## 2018-04-27 DIAGNOSIS — E1159 Type 2 diabetes mellitus with other circulatory complications: Secondary | ICD-10-CM | POA: Diagnosis not present

## 2018-04-27 DIAGNOSIS — J449 Chronic obstructive pulmonary disease, unspecified: Secondary | ICD-10-CM | POA: Diagnosis not present

## 2018-04-27 DIAGNOSIS — F339 Major depressive disorder, recurrent, unspecified: Secondary | ICD-10-CM | POA: Diagnosis not present

## 2018-04-27 DIAGNOSIS — J9610 Chronic respiratory failure, unspecified whether with hypoxia or hypercapnia: Secondary | ICD-10-CM | POA: Diagnosis not present

## 2018-04-27 DIAGNOSIS — I509 Heart failure, unspecified: Secondary | ICD-10-CM | POA: Diagnosis not present

## 2018-04-27 NOTE — Telephone Encounter (Signed)
Called and spoke with Tonya, Hospice RN who stated they were needing an order to be sent for BIPAP and supplies for pt. Pt's last OV at our office was 07/06/17 with SG and pt was told to return to office 6 months for a f/u.  At the OV with SG, SG stated that pt was noncompliant with her BIPAP. Per Kenney Houseman, pt was noncompliant at that time but is now compliant with her BIPAP and is wearing it 8-10 hours at night.  I stated to Kenney Houseman that we would need to get pt to come in for an OV due to her being noncompliant at the last OV and also since she did no show for an OV with [facility] in May 2019 knowing that the last OV pt did not have compliance with her BIPAP.  Kenney Houseman stated to me that pt is now bedbound and is unable to come in for an OV. Tonya wanted to know if RA would okay an order to be sent to [facility] for bipap supplies without pt coming in for an appt.  Dr. Elsworth Soho, please advise on this for pt and [facility] RN. Thanks!

## 2018-04-29 DIAGNOSIS — K219 Gastro-esophageal reflux disease without esophagitis: Secondary | ICD-10-CM | POA: Diagnosis not present

## 2018-04-29 DIAGNOSIS — I509 Heart failure, unspecified: Secondary | ICD-10-CM | POA: Diagnosis not present

## 2018-04-29 DIAGNOSIS — E669 Obesity, unspecified: Secondary | ICD-10-CM | POA: Diagnosis not present

## 2018-04-29 DIAGNOSIS — F339 Major depressive disorder, recurrent, unspecified: Secondary | ICD-10-CM | POA: Diagnosis not present

## 2018-04-29 DIAGNOSIS — J449 Chronic obstructive pulmonary disease, unspecified: Secondary | ICD-10-CM | POA: Diagnosis not present

## 2018-04-29 DIAGNOSIS — N189 Chronic kidney disease, unspecified: Secondary | ICD-10-CM | POA: Diagnosis not present

## 2018-04-29 DIAGNOSIS — I10 Essential (primary) hypertension: Secondary | ICD-10-CM | POA: Diagnosis not present

## 2018-04-29 DIAGNOSIS — J301 Allergic rhinitis due to pollen: Secondary | ICD-10-CM | POA: Diagnosis not present

## 2018-04-29 DIAGNOSIS — E1159 Type 2 diabetes mellitus with other circulatory complications: Secondary | ICD-10-CM | POA: Diagnosis not present

## 2018-04-29 DIAGNOSIS — J9610 Chronic respiratory failure, unspecified whether with hypoxia or hypercapnia: Secondary | ICD-10-CM | POA: Diagnosis not present

## 2018-04-30 DIAGNOSIS — J449 Chronic obstructive pulmonary disease, unspecified: Secondary | ICD-10-CM | POA: Diagnosis not present

## 2018-04-30 DIAGNOSIS — E1159 Type 2 diabetes mellitus with other circulatory complications: Secondary | ICD-10-CM | POA: Diagnosis not present

## 2018-04-30 DIAGNOSIS — J9610 Chronic respiratory failure, unspecified whether with hypoxia or hypercapnia: Secondary | ICD-10-CM | POA: Diagnosis not present

## 2018-04-30 DIAGNOSIS — I509 Heart failure, unspecified: Secondary | ICD-10-CM | POA: Diagnosis not present

## 2018-04-30 DIAGNOSIS — F339 Major depressive disorder, recurrent, unspecified: Secondary | ICD-10-CM | POA: Diagnosis not present

## 2018-04-30 DIAGNOSIS — E669 Obesity, unspecified: Secondary | ICD-10-CM | POA: Diagnosis not present

## 2018-04-30 NOTE — Telephone Encounter (Signed)
Send order for supplies as Hospice RN has confirmed patient is compliant with therapy.Thanks so much

## 2018-04-30 NOTE — Telephone Encounter (Signed)
Order for bipap supplies sent to [facility] as requested by [facility] hospice nurse.  Nothing further needed at this time.

## 2018-04-30 NOTE — Telephone Encounter (Signed)
As Dr. Elsworth Soho is not available at this time I am routing this message to Eric Form, Np

## 2018-05-02 DIAGNOSIS — E1159 Type 2 diabetes mellitus with other circulatory complications: Secondary | ICD-10-CM | POA: Diagnosis not present

## 2018-05-02 DIAGNOSIS — J9610 Chronic respiratory failure, unspecified whether with hypoxia or hypercapnia: Secondary | ICD-10-CM | POA: Diagnosis not present

## 2018-05-02 DIAGNOSIS — F339 Major depressive disorder, recurrent, unspecified: Secondary | ICD-10-CM | POA: Diagnosis not present

## 2018-05-02 DIAGNOSIS — E669 Obesity, unspecified: Secondary | ICD-10-CM | POA: Diagnosis not present

## 2018-05-02 DIAGNOSIS — J449 Chronic obstructive pulmonary disease, unspecified: Secondary | ICD-10-CM | POA: Diagnosis not present

## 2018-05-02 DIAGNOSIS — I509 Heart failure, unspecified: Secondary | ICD-10-CM | POA: Diagnosis not present

## 2018-05-04 DIAGNOSIS — J9610 Chronic respiratory failure, unspecified whether with hypoxia or hypercapnia: Secondary | ICD-10-CM | POA: Diagnosis not present

## 2018-05-04 DIAGNOSIS — E669 Obesity, unspecified: Secondary | ICD-10-CM | POA: Diagnosis not present

## 2018-05-04 DIAGNOSIS — F339 Major depressive disorder, recurrent, unspecified: Secondary | ICD-10-CM | POA: Diagnosis not present

## 2018-05-04 DIAGNOSIS — J449 Chronic obstructive pulmonary disease, unspecified: Secondary | ICD-10-CM | POA: Diagnosis not present

## 2018-05-04 DIAGNOSIS — I509 Heart failure, unspecified: Secondary | ICD-10-CM | POA: Diagnosis not present

## 2018-05-04 DIAGNOSIS — E1159 Type 2 diabetes mellitus with other circulatory complications: Secondary | ICD-10-CM | POA: Diagnosis not present

## 2018-05-07 DIAGNOSIS — E669 Obesity, unspecified: Secondary | ICD-10-CM | POA: Diagnosis not present

## 2018-05-07 DIAGNOSIS — J449 Chronic obstructive pulmonary disease, unspecified: Secondary | ICD-10-CM | POA: Diagnosis not present

## 2018-05-07 DIAGNOSIS — I509 Heart failure, unspecified: Secondary | ICD-10-CM | POA: Diagnosis not present

## 2018-05-07 DIAGNOSIS — J9610 Chronic respiratory failure, unspecified whether with hypoxia or hypercapnia: Secondary | ICD-10-CM | POA: Diagnosis not present

## 2018-05-07 DIAGNOSIS — F339 Major depressive disorder, recurrent, unspecified: Secondary | ICD-10-CM | POA: Diagnosis not present

## 2018-05-07 DIAGNOSIS — E1159 Type 2 diabetes mellitus with other circulatory complications: Secondary | ICD-10-CM | POA: Diagnosis not present

## 2018-05-08 DIAGNOSIS — F339 Major depressive disorder, recurrent, unspecified: Secondary | ICD-10-CM | POA: Diagnosis not present

## 2018-05-08 DIAGNOSIS — E669 Obesity, unspecified: Secondary | ICD-10-CM | POA: Diagnosis not present

## 2018-05-08 DIAGNOSIS — J449 Chronic obstructive pulmonary disease, unspecified: Secondary | ICD-10-CM | POA: Diagnosis not present

## 2018-05-08 DIAGNOSIS — I509 Heart failure, unspecified: Secondary | ICD-10-CM | POA: Diagnosis not present

## 2018-05-08 DIAGNOSIS — J9610 Chronic respiratory failure, unspecified whether with hypoxia or hypercapnia: Secondary | ICD-10-CM | POA: Diagnosis not present

## 2018-05-08 DIAGNOSIS — E1159 Type 2 diabetes mellitus with other circulatory complications: Secondary | ICD-10-CM | POA: Diagnosis not present

## 2018-05-09 DIAGNOSIS — F339 Major depressive disorder, recurrent, unspecified: Secondary | ICD-10-CM | POA: Diagnosis not present

## 2018-05-09 DIAGNOSIS — E1159 Type 2 diabetes mellitus with other circulatory complications: Secondary | ICD-10-CM | POA: Diagnosis not present

## 2018-05-09 DIAGNOSIS — J449 Chronic obstructive pulmonary disease, unspecified: Secondary | ICD-10-CM | POA: Diagnosis not present

## 2018-05-09 DIAGNOSIS — J9610 Chronic respiratory failure, unspecified whether with hypoxia or hypercapnia: Secondary | ICD-10-CM | POA: Diagnosis not present

## 2018-05-09 DIAGNOSIS — E669 Obesity, unspecified: Secondary | ICD-10-CM | POA: Diagnosis not present

## 2018-05-09 DIAGNOSIS — I509 Heart failure, unspecified: Secondary | ICD-10-CM | POA: Diagnosis not present

## 2018-05-11 DIAGNOSIS — J9610 Chronic respiratory failure, unspecified whether with hypoxia or hypercapnia: Secondary | ICD-10-CM | POA: Diagnosis not present

## 2018-05-11 DIAGNOSIS — J449 Chronic obstructive pulmonary disease, unspecified: Secondary | ICD-10-CM | POA: Diagnosis not present

## 2018-05-11 DIAGNOSIS — F339 Major depressive disorder, recurrent, unspecified: Secondary | ICD-10-CM | POA: Diagnosis not present

## 2018-05-11 DIAGNOSIS — E669 Obesity, unspecified: Secondary | ICD-10-CM | POA: Diagnosis not present

## 2018-05-11 DIAGNOSIS — I509 Heart failure, unspecified: Secondary | ICD-10-CM | POA: Diagnosis not present

## 2018-05-11 DIAGNOSIS — E1159 Type 2 diabetes mellitus with other circulatory complications: Secondary | ICD-10-CM | POA: Diagnosis not present

## 2018-05-14 DIAGNOSIS — E669 Obesity, unspecified: Secondary | ICD-10-CM | POA: Diagnosis not present

## 2018-05-14 DIAGNOSIS — E1159 Type 2 diabetes mellitus with other circulatory complications: Secondary | ICD-10-CM | POA: Diagnosis not present

## 2018-05-14 DIAGNOSIS — J449 Chronic obstructive pulmonary disease, unspecified: Secondary | ICD-10-CM | POA: Diagnosis not present

## 2018-05-14 DIAGNOSIS — I509 Heart failure, unspecified: Secondary | ICD-10-CM | POA: Diagnosis not present

## 2018-05-14 DIAGNOSIS — J9610 Chronic respiratory failure, unspecified whether with hypoxia or hypercapnia: Secondary | ICD-10-CM | POA: Diagnosis not present

## 2018-05-14 DIAGNOSIS — F339 Major depressive disorder, recurrent, unspecified: Secondary | ICD-10-CM | POA: Diagnosis not present

## 2018-05-16 DIAGNOSIS — E1159 Type 2 diabetes mellitus with other circulatory complications: Secondary | ICD-10-CM | POA: Diagnosis not present

## 2018-05-16 DIAGNOSIS — F339 Major depressive disorder, recurrent, unspecified: Secondary | ICD-10-CM | POA: Diagnosis not present

## 2018-05-16 DIAGNOSIS — J449 Chronic obstructive pulmonary disease, unspecified: Secondary | ICD-10-CM | POA: Diagnosis not present

## 2018-05-16 DIAGNOSIS — I509 Heart failure, unspecified: Secondary | ICD-10-CM | POA: Diagnosis not present

## 2018-05-16 DIAGNOSIS — E669 Obesity, unspecified: Secondary | ICD-10-CM | POA: Diagnosis not present

## 2018-05-16 DIAGNOSIS — J9610 Chronic respiratory failure, unspecified whether with hypoxia or hypercapnia: Secondary | ICD-10-CM | POA: Diagnosis not present

## 2018-05-17 DIAGNOSIS — E669 Obesity, unspecified: Secondary | ICD-10-CM | POA: Diagnosis not present

## 2018-05-17 DIAGNOSIS — F339 Major depressive disorder, recurrent, unspecified: Secondary | ICD-10-CM | POA: Diagnosis not present

## 2018-05-17 DIAGNOSIS — E1159 Type 2 diabetes mellitus with other circulatory complications: Secondary | ICD-10-CM | POA: Diagnosis not present

## 2018-05-17 DIAGNOSIS — I509 Heart failure, unspecified: Secondary | ICD-10-CM | POA: Diagnosis not present

## 2018-05-17 DIAGNOSIS — J9610 Chronic respiratory failure, unspecified whether with hypoxia or hypercapnia: Secondary | ICD-10-CM | POA: Diagnosis not present

## 2018-05-17 DIAGNOSIS — J449 Chronic obstructive pulmonary disease, unspecified: Secondary | ICD-10-CM | POA: Diagnosis not present

## 2018-05-18 DIAGNOSIS — J9610 Chronic respiratory failure, unspecified whether with hypoxia or hypercapnia: Secondary | ICD-10-CM | POA: Diagnosis not present

## 2018-05-18 DIAGNOSIS — E1159 Type 2 diabetes mellitus with other circulatory complications: Secondary | ICD-10-CM | POA: Diagnosis not present

## 2018-05-18 DIAGNOSIS — E669 Obesity, unspecified: Secondary | ICD-10-CM | POA: Diagnosis not present

## 2018-05-18 DIAGNOSIS — J449 Chronic obstructive pulmonary disease, unspecified: Secondary | ICD-10-CM | POA: Diagnosis not present

## 2018-05-18 DIAGNOSIS — I509 Heart failure, unspecified: Secondary | ICD-10-CM | POA: Diagnosis not present

## 2018-05-18 DIAGNOSIS — F339 Major depressive disorder, recurrent, unspecified: Secondary | ICD-10-CM | POA: Diagnosis not present

## 2018-05-21 DIAGNOSIS — E669 Obesity, unspecified: Secondary | ICD-10-CM | POA: Diagnosis not present

## 2018-05-21 DIAGNOSIS — J9610 Chronic respiratory failure, unspecified whether with hypoxia or hypercapnia: Secondary | ICD-10-CM | POA: Diagnosis not present

## 2018-05-21 DIAGNOSIS — I509 Heart failure, unspecified: Secondary | ICD-10-CM | POA: Diagnosis not present

## 2018-05-21 DIAGNOSIS — F339 Major depressive disorder, recurrent, unspecified: Secondary | ICD-10-CM | POA: Diagnosis not present

## 2018-05-21 DIAGNOSIS — E1159 Type 2 diabetes mellitus with other circulatory complications: Secondary | ICD-10-CM | POA: Diagnosis not present

## 2018-05-21 DIAGNOSIS — J449 Chronic obstructive pulmonary disease, unspecified: Secondary | ICD-10-CM | POA: Diagnosis not present

## 2018-05-25 DIAGNOSIS — J449 Chronic obstructive pulmonary disease, unspecified: Secondary | ICD-10-CM | POA: Diagnosis not present

## 2018-05-25 DIAGNOSIS — I509 Heart failure, unspecified: Secondary | ICD-10-CM | POA: Diagnosis not present

## 2018-05-25 DIAGNOSIS — J9610 Chronic respiratory failure, unspecified whether with hypoxia or hypercapnia: Secondary | ICD-10-CM | POA: Diagnosis not present

## 2018-05-25 DIAGNOSIS — F339 Major depressive disorder, recurrent, unspecified: Secondary | ICD-10-CM | POA: Diagnosis not present

## 2018-05-25 DIAGNOSIS — E669 Obesity, unspecified: Secondary | ICD-10-CM | POA: Diagnosis not present

## 2018-05-25 DIAGNOSIS — E1159 Type 2 diabetes mellitus with other circulatory complications: Secondary | ICD-10-CM | POA: Diagnosis not present

## 2018-05-28 DIAGNOSIS — J9610 Chronic respiratory failure, unspecified whether with hypoxia or hypercapnia: Secondary | ICD-10-CM | POA: Diagnosis not present

## 2018-05-28 DIAGNOSIS — E669 Obesity, unspecified: Secondary | ICD-10-CM | POA: Diagnosis not present

## 2018-05-28 DIAGNOSIS — I509 Heart failure, unspecified: Secondary | ICD-10-CM | POA: Diagnosis not present

## 2018-05-28 DIAGNOSIS — J449 Chronic obstructive pulmonary disease, unspecified: Secondary | ICD-10-CM | POA: Diagnosis not present

## 2018-05-28 DIAGNOSIS — F339 Major depressive disorder, recurrent, unspecified: Secondary | ICD-10-CM | POA: Diagnosis not present

## 2018-05-28 DIAGNOSIS — E1159 Type 2 diabetes mellitus with other circulatory complications: Secondary | ICD-10-CM | POA: Diagnosis not present

## 2018-05-30 DIAGNOSIS — J449 Chronic obstructive pulmonary disease, unspecified: Secondary | ICD-10-CM | POA: Diagnosis not present

## 2018-05-30 DIAGNOSIS — I509 Heart failure, unspecified: Secondary | ICD-10-CM | POA: Diagnosis not present

## 2018-05-30 DIAGNOSIS — N189 Chronic kidney disease, unspecified: Secondary | ICD-10-CM | POA: Diagnosis not present

## 2018-05-30 DIAGNOSIS — F339 Major depressive disorder, recurrent, unspecified: Secondary | ICD-10-CM | POA: Diagnosis not present

## 2018-05-30 DIAGNOSIS — J9610 Chronic respiratory failure, unspecified whether with hypoxia or hypercapnia: Secondary | ICD-10-CM | POA: Diagnosis not present

## 2018-05-30 DIAGNOSIS — I10 Essential (primary) hypertension: Secondary | ICD-10-CM | POA: Diagnosis not present

## 2018-05-30 DIAGNOSIS — J301 Allergic rhinitis due to pollen: Secondary | ICD-10-CM | POA: Diagnosis not present

## 2018-05-30 DIAGNOSIS — E1159 Type 2 diabetes mellitus with other circulatory complications: Secondary | ICD-10-CM | POA: Diagnosis not present

## 2018-05-30 DIAGNOSIS — K219 Gastro-esophageal reflux disease without esophagitis: Secondary | ICD-10-CM | POA: Diagnosis not present

## 2018-05-30 DIAGNOSIS — E669 Obesity, unspecified: Secondary | ICD-10-CM | POA: Diagnosis not present

## 2018-06-01 DIAGNOSIS — I509 Heart failure, unspecified: Secondary | ICD-10-CM | POA: Diagnosis not present

## 2018-06-01 DIAGNOSIS — E1159 Type 2 diabetes mellitus with other circulatory complications: Secondary | ICD-10-CM | POA: Diagnosis not present

## 2018-06-01 DIAGNOSIS — E669 Obesity, unspecified: Secondary | ICD-10-CM | POA: Diagnosis not present

## 2018-06-01 DIAGNOSIS — F339 Major depressive disorder, recurrent, unspecified: Secondary | ICD-10-CM | POA: Diagnosis not present

## 2018-06-01 DIAGNOSIS — J9610 Chronic respiratory failure, unspecified whether with hypoxia or hypercapnia: Secondary | ICD-10-CM | POA: Diagnosis not present

## 2018-06-01 DIAGNOSIS — J449 Chronic obstructive pulmonary disease, unspecified: Secondary | ICD-10-CM | POA: Diagnosis not present

## 2018-06-04 DIAGNOSIS — I509 Heart failure, unspecified: Secondary | ICD-10-CM | POA: Diagnosis not present

## 2018-06-04 DIAGNOSIS — E1159 Type 2 diabetes mellitus with other circulatory complications: Secondary | ICD-10-CM | POA: Diagnosis not present

## 2018-06-04 DIAGNOSIS — F339 Major depressive disorder, recurrent, unspecified: Secondary | ICD-10-CM | POA: Diagnosis not present

## 2018-06-04 DIAGNOSIS — J9610 Chronic respiratory failure, unspecified whether with hypoxia or hypercapnia: Secondary | ICD-10-CM | POA: Diagnosis not present

## 2018-06-04 DIAGNOSIS — J449 Chronic obstructive pulmonary disease, unspecified: Secondary | ICD-10-CM | POA: Diagnosis not present

## 2018-06-04 DIAGNOSIS — E669 Obesity, unspecified: Secondary | ICD-10-CM | POA: Diagnosis not present

## 2018-06-08 DIAGNOSIS — E669 Obesity, unspecified: Secondary | ICD-10-CM | POA: Diagnosis not present

## 2018-06-08 DIAGNOSIS — F339 Major depressive disorder, recurrent, unspecified: Secondary | ICD-10-CM | POA: Diagnosis not present

## 2018-06-08 DIAGNOSIS — E1159 Type 2 diabetes mellitus with other circulatory complications: Secondary | ICD-10-CM | POA: Diagnosis not present

## 2018-06-08 DIAGNOSIS — I509 Heart failure, unspecified: Secondary | ICD-10-CM | POA: Diagnosis not present

## 2018-06-08 DIAGNOSIS — J449 Chronic obstructive pulmonary disease, unspecified: Secondary | ICD-10-CM | POA: Diagnosis not present

## 2018-06-08 DIAGNOSIS — J9610 Chronic respiratory failure, unspecified whether with hypoxia or hypercapnia: Secondary | ICD-10-CM | POA: Diagnosis not present

## 2018-06-11 DIAGNOSIS — J9610 Chronic respiratory failure, unspecified whether with hypoxia or hypercapnia: Secondary | ICD-10-CM | POA: Diagnosis not present

## 2018-06-11 DIAGNOSIS — E1159 Type 2 diabetes mellitus with other circulatory complications: Secondary | ICD-10-CM | POA: Diagnosis not present

## 2018-06-11 DIAGNOSIS — E669 Obesity, unspecified: Secondary | ICD-10-CM | POA: Diagnosis not present

## 2018-06-11 DIAGNOSIS — J449 Chronic obstructive pulmonary disease, unspecified: Secondary | ICD-10-CM | POA: Diagnosis not present

## 2018-06-11 DIAGNOSIS — F339 Major depressive disorder, recurrent, unspecified: Secondary | ICD-10-CM | POA: Diagnosis not present

## 2018-06-11 DIAGNOSIS — I509 Heart failure, unspecified: Secondary | ICD-10-CM | POA: Diagnosis not present

## 2018-06-13 DIAGNOSIS — E1159 Type 2 diabetes mellitus with other circulatory complications: Secondary | ICD-10-CM | POA: Diagnosis not present

## 2018-06-13 DIAGNOSIS — J449 Chronic obstructive pulmonary disease, unspecified: Secondary | ICD-10-CM | POA: Diagnosis not present

## 2018-06-13 DIAGNOSIS — I509 Heart failure, unspecified: Secondary | ICD-10-CM | POA: Diagnosis not present

## 2018-06-13 DIAGNOSIS — F339 Major depressive disorder, recurrent, unspecified: Secondary | ICD-10-CM | POA: Diagnosis not present

## 2018-06-13 DIAGNOSIS — J9610 Chronic respiratory failure, unspecified whether with hypoxia or hypercapnia: Secondary | ICD-10-CM | POA: Diagnosis not present

## 2018-06-13 DIAGNOSIS — E669 Obesity, unspecified: Secondary | ICD-10-CM | POA: Diagnosis not present

## 2018-06-15 DIAGNOSIS — I509 Heart failure, unspecified: Secondary | ICD-10-CM | POA: Diagnosis not present

## 2018-06-15 DIAGNOSIS — E1159 Type 2 diabetes mellitus with other circulatory complications: Secondary | ICD-10-CM | POA: Diagnosis not present

## 2018-06-15 DIAGNOSIS — F339 Major depressive disorder, recurrent, unspecified: Secondary | ICD-10-CM | POA: Diagnosis not present

## 2018-06-15 DIAGNOSIS — J449 Chronic obstructive pulmonary disease, unspecified: Secondary | ICD-10-CM | POA: Diagnosis not present

## 2018-06-15 DIAGNOSIS — J9610 Chronic respiratory failure, unspecified whether with hypoxia or hypercapnia: Secondary | ICD-10-CM | POA: Diagnosis not present

## 2018-06-15 DIAGNOSIS — E669 Obesity, unspecified: Secondary | ICD-10-CM | POA: Diagnosis not present

## 2018-06-18 DIAGNOSIS — E1159 Type 2 diabetes mellitus with other circulatory complications: Secondary | ICD-10-CM | POA: Diagnosis not present

## 2018-06-18 DIAGNOSIS — F339 Major depressive disorder, recurrent, unspecified: Secondary | ICD-10-CM | POA: Diagnosis not present

## 2018-06-18 DIAGNOSIS — J9610 Chronic respiratory failure, unspecified whether with hypoxia or hypercapnia: Secondary | ICD-10-CM | POA: Diagnosis not present

## 2018-06-18 DIAGNOSIS — E669 Obesity, unspecified: Secondary | ICD-10-CM | POA: Diagnosis not present

## 2018-06-18 DIAGNOSIS — I509 Heart failure, unspecified: Secondary | ICD-10-CM | POA: Diagnosis not present

## 2018-06-18 DIAGNOSIS — J449 Chronic obstructive pulmonary disease, unspecified: Secondary | ICD-10-CM | POA: Diagnosis not present

## 2018-06-20 DIAGNOSIS — E1159 Type 2 diabetes mellitus with other circulatory complications: Secondary | ICD-10-CM | POA: Diagnosis not present

## 2018-06-20 DIAGNOSIS — I509 Heart failure, unspecified: Secondary | ICD-10-CM | POA: Diagnosis not present

## 2018-06-20 DIAGNOSIS — F339 Major depressive disorder, recurrent, unspecified: Secondary | ICD-10-CM | POA: Diagnosis not present

## 2018-06-20 DIAGNOSIS — E669 Obesity, unspecified: Secondary | ICD-10-CM | POA: Diagnosis not present

## 2018-06-20 DIAGNOSIS — J449 Chronic obstructive pulmonary disease, unspecified: Secondary | ICD-10-CM | POA: Diagnosis not present

## 2018-06-20 DIAGNOSIS — J9610 Chronic respiratory failure, unspecified whether with hypoxia or hypercapnia: Secondary | ICD-10-CM | POA: Diagnosis not present

## 2018-06-22 DIAGNOSIS — F339 Major depressive disorder, recurrent, unspecified: Secondary | ICD-10-CM | POA: Diagnosis not present

## 2018-06-22 DIAGNOSIS — J449 Chronic obstructive pulmonary disease, unspecified: Secondary | ICD-10-CM | POA: Diagnosis not present

## 2018-06-22 DIAGNOSIS — E1159 Type 2 diabetes mellitus with other circulatory complications: Secondary | ICD-10-CM | POA: Diagnosis not present

## 2018-06-22 DIAGNOSIS — J9610 Chronic respiratory failure, unspecified whether with hypoxia or hypercapnia: Secondary | ICD-10-CM | POA: Diagnosis not present

## 2018-06-22 DIAGNOSIS — I509 Heart failure, unspecified: Secondary | ICD-10-CM | POA: Diagnosis not present

## 2018-06-22 DIAGNOSIS — E669 Obesity, unspecified: Secondary | ICD-10-CM | POA: Diagnosis not present

## 2018-06-25 DIAGNOSIS — J9610 Chronic respiratory failure, unspecified whether with hypoxia or hypercapnia: Secondary | ICD-10-CM | POA: Diagnosis not present

## 2018-06-25 DIAGNOSIS — I509 Heart failure, unspecified: Secondary | ICD-10-CM | POA: Diagnosis not present

## 2018-06-25 DIAGNOSIS — E1159 Type 2 diabetes mellitus with other circulatory complications: Secondary | ICD-10-CM | POA: Diagnosis not present

## 2018-06-25 DIAGNOSIS — J449 Chronic obstructive pulmonary disease, unspecified: Secondary | ICD-10-CM | POA: Diagnosis not present

## 2018-06-25 DIAGNOSIS — E669 Obesity, unspecified: Secondary | ICD-10-CM | POA: Diagnosis not present

## 2018-06-25 DIAGNOSIS — F339 Major depressive disorder, recurrent, unspecified: Secondary | ICD-10-CM | POA: Diagnosis not present

## 2018-06-27 DIAGNOSIS — I509 Heart failure, unspecified: Secondary | ICD-10-CM | POA: Diagnosis not present

## 2018-06-27 DIAGNOSIS — F339 Major depressive disorder, recurrent, unspecified: Secondary | ICD-10-CM | POA: Diagnosis not present

## 2018-06-27 DIAGNOSIS — J9610 Chronic respiratory failure, unspecified whether with hypoxia or hypercapnia: Secondary | ICD-10-CM | POA: Diagnosis not present

## 2018-06-27 DIAGNOSIS — J449 Chronic obstructive pulmonary disease, unspecified: Secondary | ICD-10-CM | POA: Diagnosis not present

## 2018-06-27 DIAGNOSIS — E1159 Type 2 diabetes mellitus with other circulatory complications: Secondary | ICD-10-CM | POA: Diagnosis not present

## 2018-06-27 DIAGNOSIS — E669 Obesity, unspecified: Secondary | ICD-10-CM | POA: Diagnosis not present

## 2018-06-29 DIAGNOSIS — J9610 Chronic respiratory failure, unspecified whether with hypoxia or hypercapnia: Secondary | ICD-10-CM | POA: Diagnosis not present

## 2018-06-29 DIAGNOSIS — J449 Chronic obstructive pulmonary disease, unspecified: Secondary | ICD-10-CM | POA: Diagnosis not present

## 2018-06-29 DIAGNOSIS — F339 Major depressive disorder, recurrent, unspecified: Secondary | ICD-10-CM | POA: Diagnosis not present

## 2018-06-29 DIAGNOSIS — E1159 Type 2 diabetes mellitus with other circulatory complications: Secondary | ICD-10-CM | POA: Diagnosis not present

## 2018-06-29 DIAGNOSIS — I509 Heart failure, unspecified: Secondary | ICD-10-CM | POA: Diagnosis not present

## 2018-06-29 DIAGNOSIS — E669 Obesity, unspecified: Secondary | ICD-10-CM | POA: Diagnosis not present

## 2018-06-30 DIAGNOSIS — E669 Obesity, unspecified: Secondary | ICD-10-CM | POA: Diagnosis not present

## 2018-06-30 DIAGNOSIS — F339 Major depressive disorder, recurrent, unspecified: Secondary | ICD-10-CM | POA: Diagnosis not present

## 2018-06-30 DIAGNOSIS — I10 Essential (primary) hypertension: Secondary | ICD-10-CM | POA: Diagnosis not present

## 2018-06-30 DIAGNOSIS — J9610 Chronic respiratory failure, unspecified whether with hypoxia or hypercapnia: Secondary | ICD-10-CM | POA: Diagnosis not present

## 2018-06-30 DIAGNOSIS — N189 Chronic kidney disease, unspecified: Secondary | ICD-10-CM | POA: Diagnosis not present

## 2018-06-30 DIAGNOSIS — J301 Allergic rhinitis due to pollen: Secondary | ICD-10-CM | POA: Diagnosis not present

## 2018-06-30 DIAGNOSIS — K219 Gastro-esophageal reflux disease without esophagitis: Secondary | ICD-10-CM | POA: Diagnosis not present

## 2018-06-30 DIAGNOSIS — E1159 Type 2 diabetes mellitus with other circulatory complications: Secondary | ICD-10-CM | POA: Diagnosis not present

## 2018-06-30 DIAGNOSIS — J449 Chronic obstructive pulmonary disease, unspecified: Secondary | ICD-10-CM | POA: Diagnosis not present

## 2018-06-30 DIAGNOSIS — I509 Heart failure, unspecified: Secondary | ICD-10-CM | POA: Diagnosis not present

## 2018-07-02 DIAGNOSIS — I509 Heart failure, unspecified: Secondary | ICD-10-CM | POA: Diagnosis not present

## 2018-07-02 DIAGNOSIS — F339 Major depressive disorder, recurrent, unspecified: Secondary | ICD-10-CM | POA: Diagnosis not present

## 2018-07-02 DIAGNOSIS — J9610 Chronic respiratory failure, unspecified whether with hypoxia or hypercapnia: Secondary | ICD-10-CM | POA: Diagnosis not present

## 2018-07-02 DIAGNOSIS — E1159 Type 2 diabetes mellitus with other circulatory complications: Secondary | ICD-10-CM | POA: Diagnosis not present

## 2018-07-02 DIAGNOSIS — E669 Obesity, unspecified: Secondary | ICD-10-CM | POA: Diagnosis not present

## 2018-07-02 DIAGNOSIS — J449 Chronic obstructive pulmonary disease, unspecified: Secondary | ICD-10-CM | POA: Diagnosis not present

## 2018-07-04 DIAGNOSIS — E1159 Type 2 diabetes mellitus with other circulatory complications: Secondary | ICD-10-CM | POA: Diagnosis not present

## 2018-07-04 DIAGNOSIS — I509 Heart failure, unspecified: Secondary | ICD-10-CM | POA: Diagnosis not present

## 2018-07-04 DIAGNOSIS — J9610 Chronic respiratory failure, unspecified whether with hypoxia or hypercapnia: Secondary | ICD-10-CM | POA: Diagnosis not present

## 2018-07-04 DIAGNOSIS — F339 Major depressive disorder, recurrent, unspecified: Secondary | ICD-10-CM | POA: Diagnosis not present

## 2018-07-04 DIAGNOSIS — J449 Chronic obstructive pulmonary disease, unspecified: Secondary | ICD-10-CM | POA: Diagnosis not present

## 2018-07-04 DIAGNOSIS — E669 Obesity, unspecified: Secondary | ICD-10-CM | POA: Diagnosis not present

## 2018-07-05 DIAGNOSIS — B351 Tinea unguium: Secondary | ICD-10-CM | POA: Diagnosis not present

## 2018-07-05 DIAGNOSIS — E1151 Type 2 diabetes mellitus with diabetic peripheral angiopathy without gangrene: Secondary | ICD-10-CM | POA: Diagnosis not present

## 2018-07-05 DIAGNOSIS — M79675 Pain in left toe(s): Secondary | ICD-10-CM | POA: Diagnosis not present

## 2018-07-05 DIAGNOSIS — L84 Corns and callosities: Secondary | ICD-10-CM | POA: Diagnosis not present

## 2018-07-05 DIAGNOSIS — M2041 Other hammer toe(s) (acquired), right foot: Secondary | ICD-10-CM | POA: Diagnosis not present

## 2018-07-06 DIAGNOSIS — E1159 Type 2 diabetes mellitus with other circulatory complications: Secondary | ICD-10-CM | POA: Diagnosis not present

## 2018-07-06 DIAGNOSIS — I509 Heart failure, unspecified: Secondary | ICD-10-CM | POA: Diagnosis not present

## 2018-07-06 DIAGNOSIS — J449 Chronic obstructive pulmonary disease, unspecified: Secondary | ICD-10-CM | POA: Diagnosis not present

## 2018-07-06 DIAGNOSIS — J9610 Chronic respiratory failure, unspecified whether with hypoxia or hypercapnia: Secondary | ICD-10-CM | POA: Diagnosis not present

## 2018-07-06 DIAGNOSIS — E669 Obesity, unspecified: Secondary | ICD-10-CM | POA: Diagnosis not present

## 2018-07-06 DIAGNOSIS — F339 Major depressive disorder, recurrent, unspecified: Secondary | ICD-10-CM | POA: Diagnosis not present

## 2018-07-09 DIAGNOSIS — E669 Obesity, unspecified: Secondary | ICD-10-CM | POA: Diagnosis not present

## 2018-07-09 DIAGNOSIS — E1159 Type 2 diabetes mellitus with other circulatory complications: Secondary | ICD-10-CM | POA: Diagnosis not present

## 2018-07-09 DIAGNOSIS — J449 Chronic obstructive pulmonary disease, unspecified: Secondary | ICD-10-CM | POA: Diagnosis not present

## 2018-07-09 DIAGNOSIS — F339 Major depressive disorder, recurrent, unspecified: Secondary | ICD-10-CM | POA: Diagnosis not present

## 2018-07-09 DIAGNOSIS — I509 Heart failure, unspecified: Secondary | ICD-10-CM | POA: Diagnosis not present

## 2018-07-09 DIAGNOSIS — J9610 Chronic respiratory failure, unspecified whether with hypoxia or hypercapnia: Secondary | ICD-10-CM | POA: Diagnosis not present

## 2018-07-11 DIAGNOSIS — F339 Major depressive disorder, recurrent, unspecified: Secondary | ICD-10-CM | POA: Diagnosis not present

## 2018-07-11 DIAGNOSIS — J9610 Chronic respiratory failure, unspecified whether with hypoxia or hypercapnia: Secondary | ICD-10-CM | POA: Diagnosis not present

## 2018-07-11 DIAGNOSIS — I509 Heart failure, unspecified: Secondary | ICD-10-CM | POA: Diagnosis not present

## 2018-07-11 DIAGNOSIS — J449 Chronic obstructive pulmonary disease, unspecified: Secondary | ICD-10-CM | POA: Diagnosis not present

## 2018-07-11 DIAGNOSIS — E1159 Type 2 diabetes mellitus with other circulatory complications: Secondary | ICD-10-CM | POA: Diagnosis not present

## 2018-07-11 DIAGNOSIS — E669 Obesity, unspecified: Secondary | ICD-10-CM | POA: Diagnosis not present

## 2018-07-12 DIAGNOSIS — E1159 Type 2 diabetes mellitus with other circulatory complications: Secondary | ICD-10-CM | POA: Diagnosis not present

## 2018-07-12 DIAGNOSIS — E669 Obesity, unspecified: Secondary | ICD-10-CM | POA: Diagnosis not present

## 2018-07-12 DIAGNOSIS — J449 Chronic obstructive pulmonary disease, unspecified: Secondary | ICD-10-CM | POA: Diagnosis not present

## 2018-07-12 DIAGNOSIS — J9610 Chronic respiratory failure, unspecified whether with hypoxia or hypercapnia: Secondary | ICD-10-CM | POA: Diagnosis not present

## 2018-07-12 DIAGNOSIS — F339 Major depressive disorder, recurrent, unspecified: Secondary | ICD-10-CM | POA: Diagnosis not present

## 2018-07-12 DIAGNOSIS — I509 Heart failure, unspecified: Secondary | ICD-10-CM | POA: Diagnosis not present

## 2018-07-29 DEATH — deceased

## 2018-09-27 ENCOUNTER — Other Ambulatory Visit: Payer: Self-pay | Admitting: *Deleted

## 2019-09-21 IMAGING — CR DG CHEST 2V
2 series · 2 of 2 positions shown · non-contrast
Comparison: 12/31/2016

CLINICAL DATA: Shortness of breath and cough for 2 weeks

EXAM:
CHEST  2 VIEW

[w chest lat]
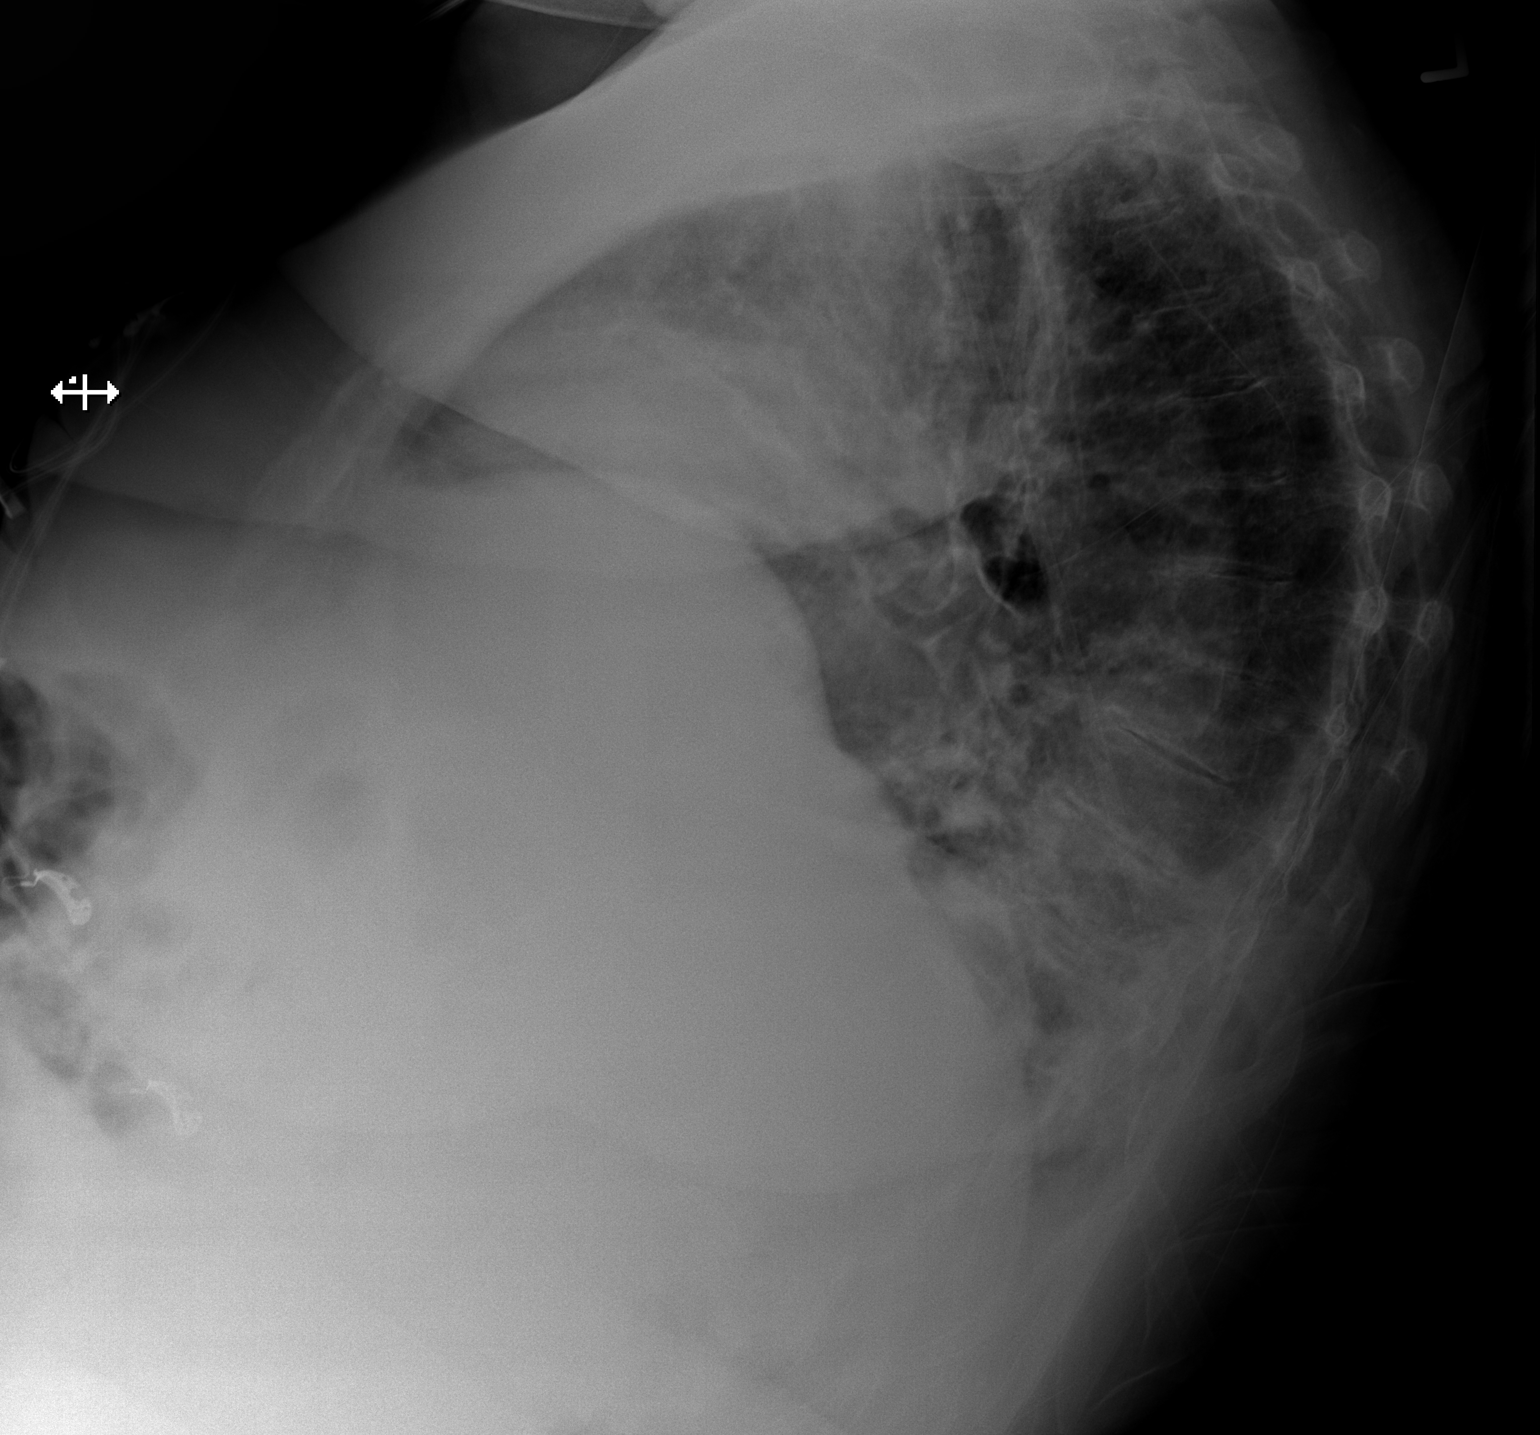

[x chest ap]
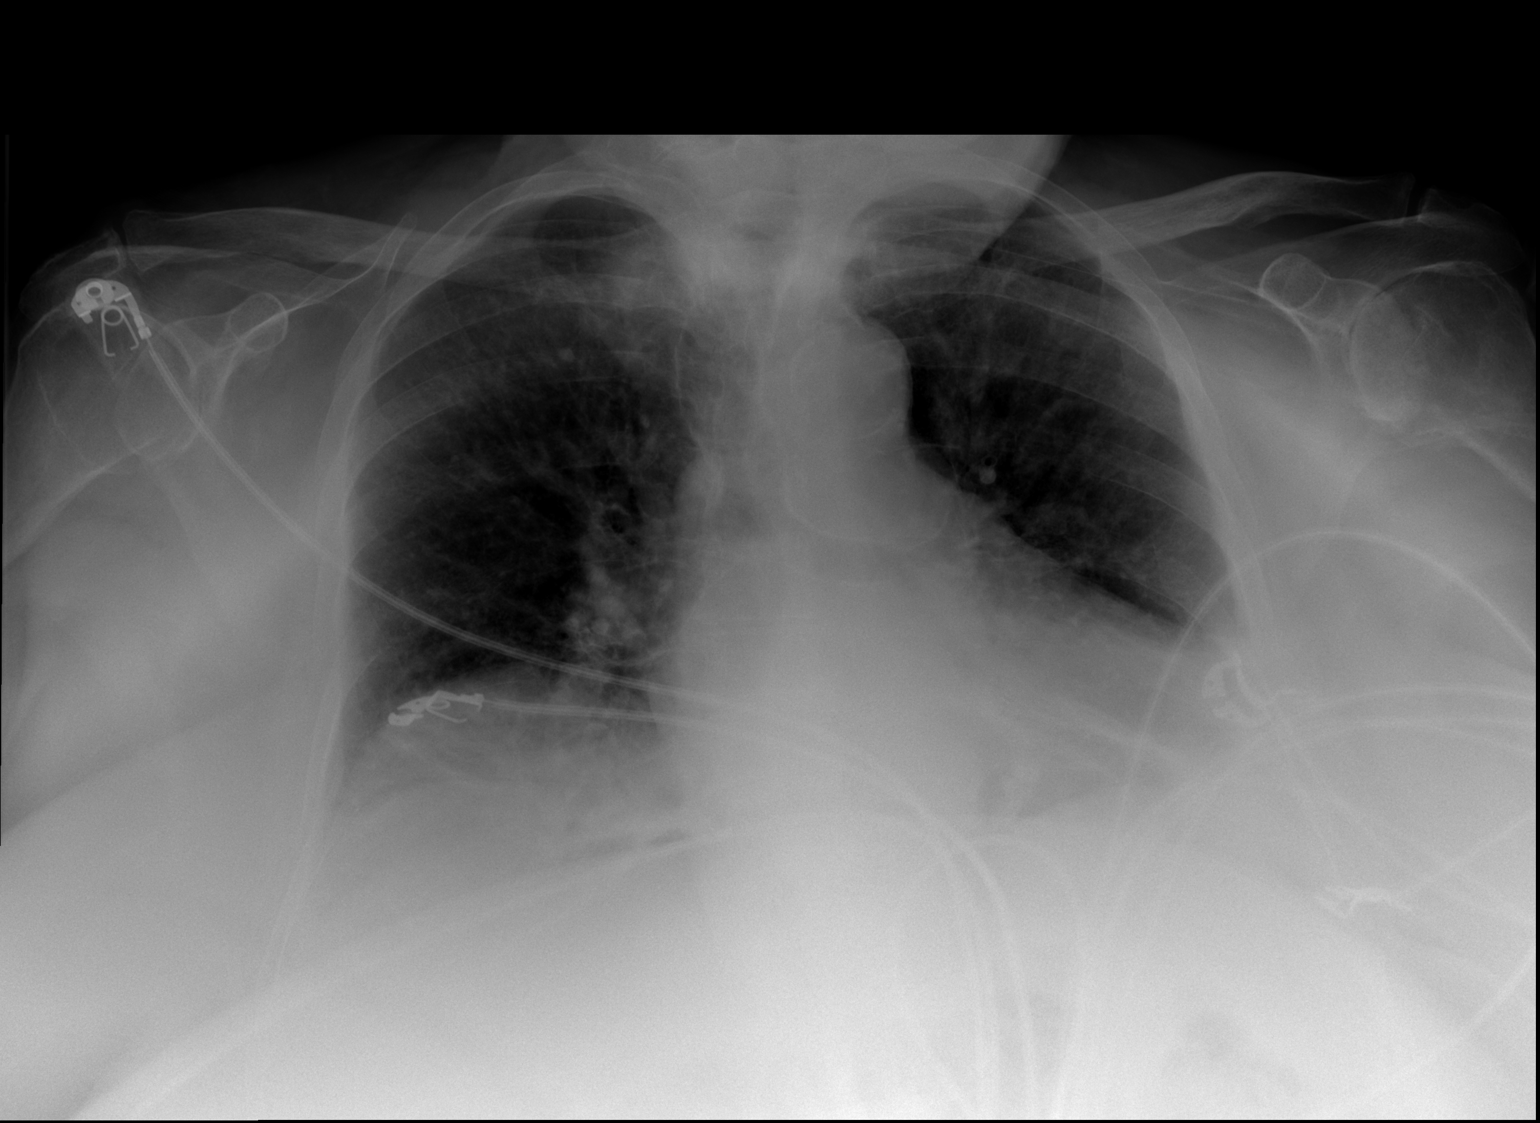

[2 of 2 positions shown; findings below may reference images not displayed]

FINDINGS: Cardiac shadow is enlarged but stable. Aortic calcifications are
again seen. Elevation of the right hemidiaphragm is again seen.
Early infiltrate is noted in the right lung base posteriorly. No
bony abnormality is seen.
IMPRESSION: Early infiltrate versus atelectasis in the right lung base.

## 2019-12-08 IMAGING — DX DG CHEST 1V
2 series · 2 of 2 positions shown · non-contrast
Comparison: 06/02/2017

CLINICAL DATA: Hypoxia

EXAM:
CHEST 1 VIEW

[chest ap (1 of 2)]
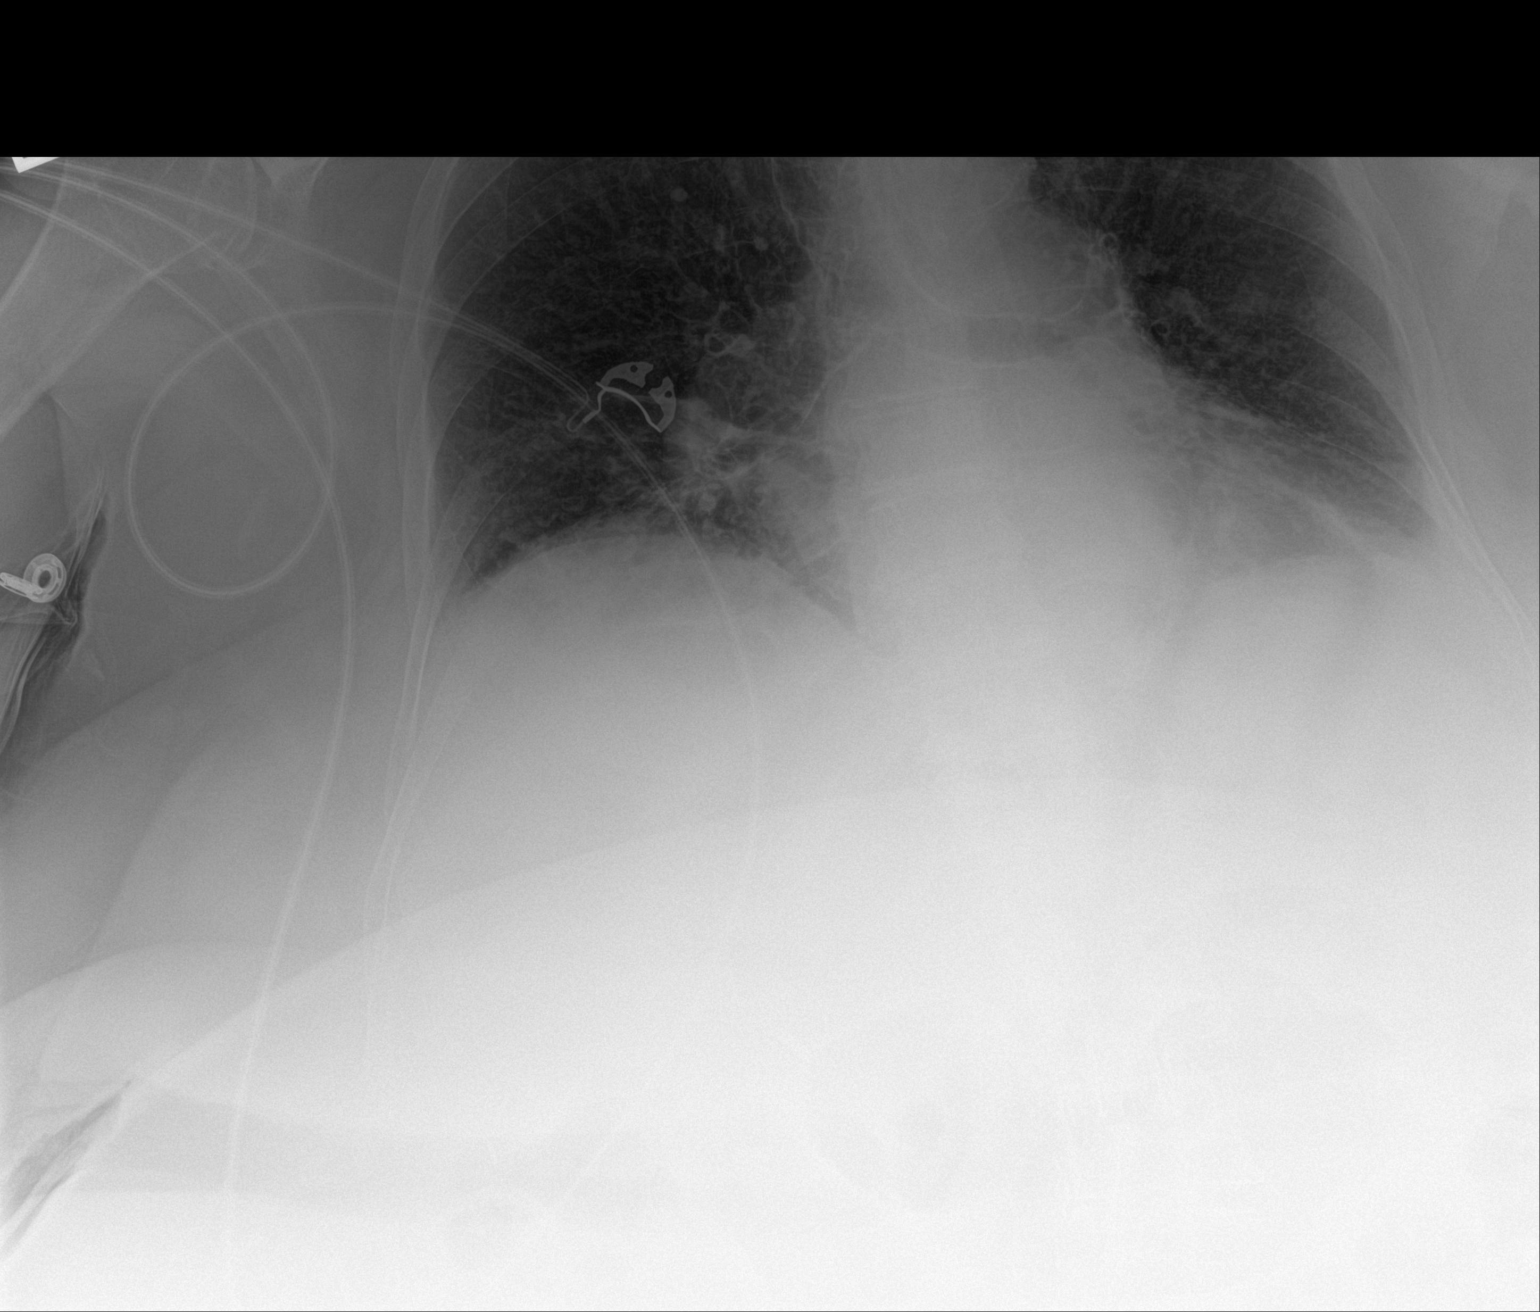

[chest ap (2 of 2)]
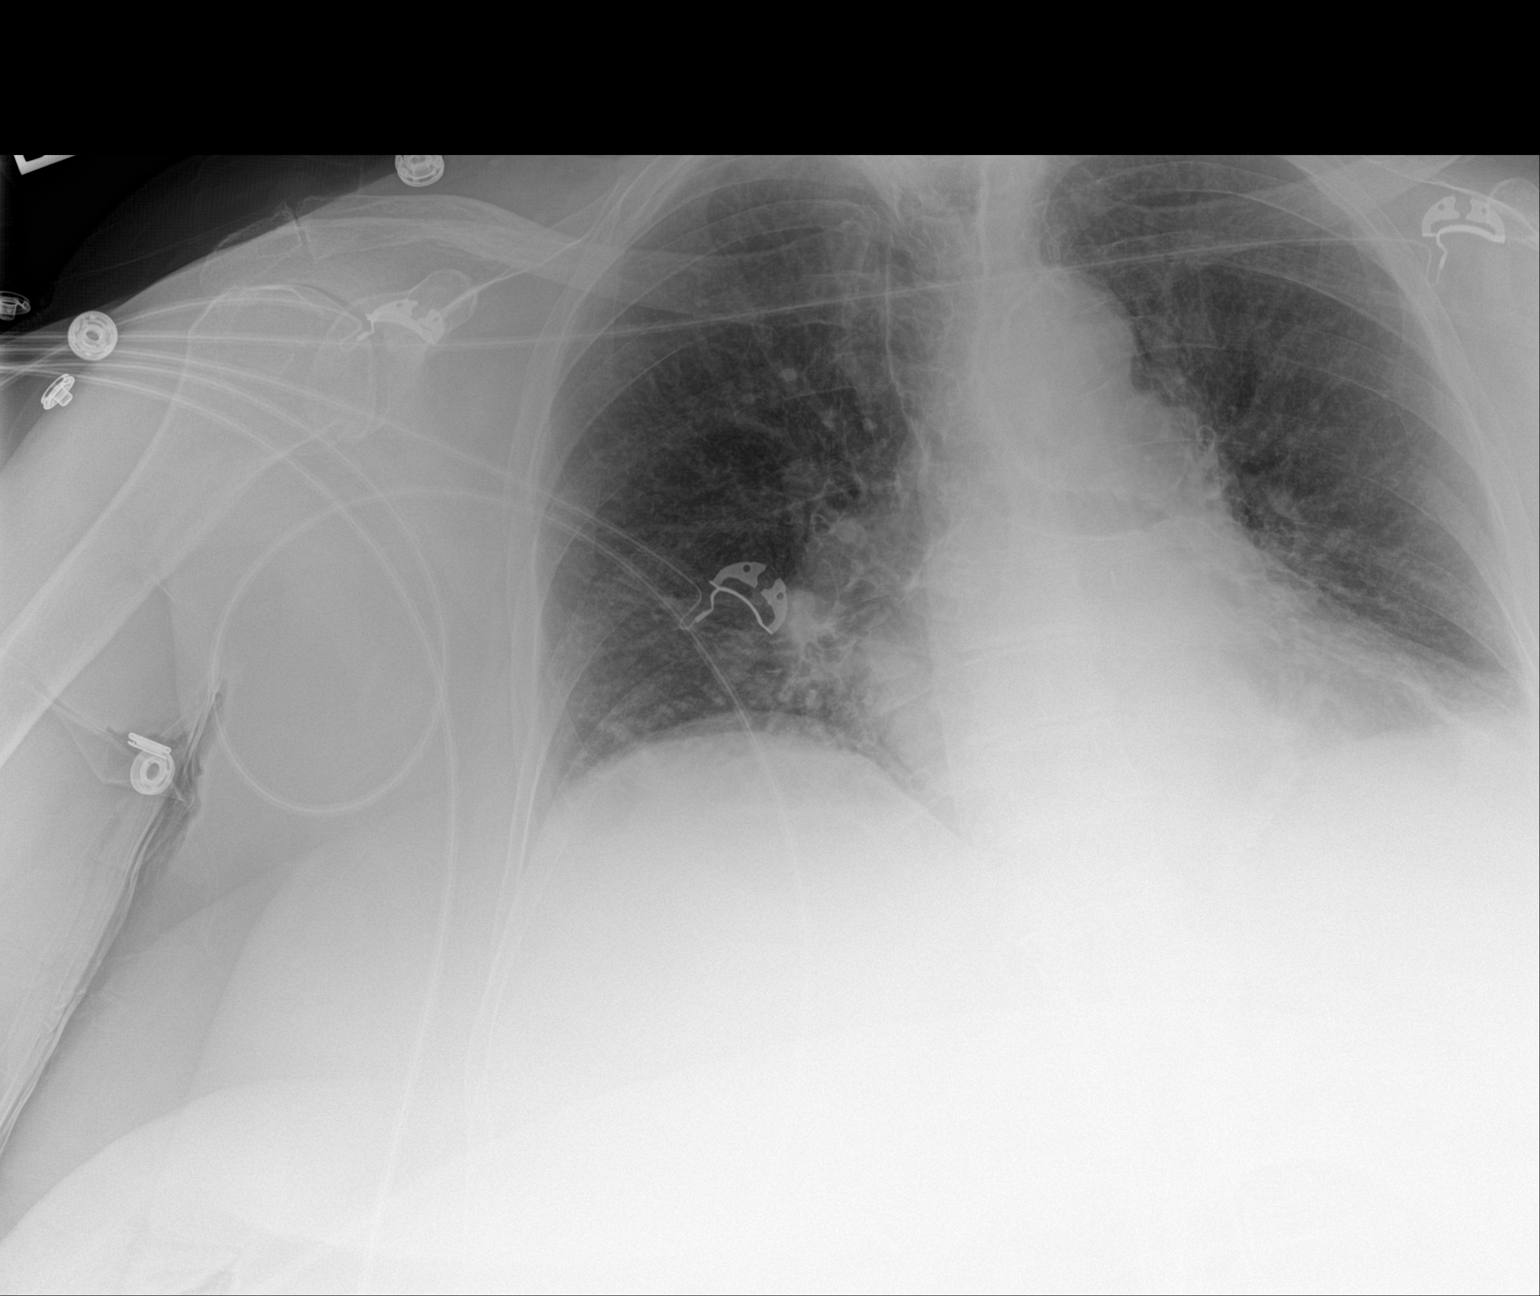

[2 of 2 positions shown; findings below may reference images not displayed]

FINDINGS: There are low lung volumes. There is no focal parenchymal opacity.
There is no pleural effusion or pneumothorax. The heart and
mediastinal contours are unremarkable. There is thoracic aortic
atherosclerosis.

The osseous structures are unremarkable.
IMPRESSION: No active disease.
